# Patient Record
Sex: Male | Born: 1935
Health system: Southern US, Community
[De-identification: ages and names within clinical notes are randomized; demographics above are authoritative.]

## PROBLEM LIST (undated history)

## (undated) DIAGNOSIS — M1990 Unspecified osteoarthritis, unspecified site: Secondary | ICD-10-CM

## (undated) DIAGNOSIS — R351 Nocturia: Secondary | ICD-10-CM

## (undated) DIAGNOSIS — I10 Essential (primary) hypertension: Secondary | ICD-10-CM

## (undated) DIAGNOSIS — N400 Enlarged prostate without lower urinary tract symptoms: Secondary | ICD-10-CM

## (undated) DIAGNOSIS — M545 Low back pain, unspecified: Secondary | ICD-10-CM

## (undated) DIAGNOSIS — I639 Cerebral infarction, unspecified: Secondary | ICD-10-CM

## (undated) DIAGNOSIS — R42 Dizziness and giddiness: Secondary | ICD-10-CM

## (undated) DIAGNOSIS — B029 Zoster without complications: Secondary | ICD-10-CM

## (undated) DIAGNOSIS — R55 Syncope and collapse: Secondary | ICD-10-CM

## (undated) DIAGNOSIS — K219 Gastro-esophageal reflux disease without esophagitis: Secondary | ICD-10-CM

## (undated) DIAGNOSIS — S93609A Unspecified sprain of unspecified foot, initial encounter: Secondary | ICD-10-CM

## (undated) DIAGNOSIS — H9190 Unspecified hearing loss, unspecified ear: Secondary | ICD-10-CM

## (undated) DIAGNOSIS — N189 Chronic kidney disease, unspecified: Secondary | ICD-10-CM

## (undated) DIAGNOSIS — Z978 Presence of other specified devices: Secondary | ICD-10-CM

## (undated) DIAGNOSIS — E7800 Pure hypercholesterolemia, unspecified: Secondary | ICD-10-CM

## (undated) DIAGNOSIS — Z960 Presence of urogenital implants: Secondary | ICD-10-CM

## (undated) DIAGNOSIS — Z8673 Personal history of transient ischemic attack (TIA), and cerebral infarction without residual deficits: Secondary | ICD-10-CM

## (undated) HISTORY — DX: Dizziness and giddiness: R42

## (undated) HISTORY — PX: SHOULDER SURGERY: SHX246

## (undated) HISTORY — DX: Syncope and collapse: R55

## (undated) HISTORY — DX: Benign prostatic hyperplasia without lower urinary tract symptoms: N40.0

## (undated) HISTORY — PX: COLONOSCOPY: SHX5424

## (undated) HISTORY — DX: Essential (primary) hypertension: I10

## (undated) HISTORY — DX: Nocturia: R35.1

## (undated) HISTORY — DX: Chronic kidney disease, unspecified: N18.9

## (undated) HISTORY — DX: Low back pain, unspecified: M54.50

## (undated) HISTORY — DX: Zoster without complications: B02.9

## (undated) HISTORY — DX: Low back pain: M54.5

---

## 2000-05-24 ENCOUNTER — Encounter: Payer: Self-pay | Admitting: Family Medicine

## 2000-05-24 ENCOUNTER — Ambulatory Visit (HOSPITAL_COMMUNITY): Admission: RE | Admit: 2000-05-24 | Discharge: 2000-05-24 | Payer: Self-pay | Admitting: Family Medicine

## 2000-06-03 ENCOUNTER — Emergency Department (HOSPITAL_COMMUNITY): Admission: EM | Admit: 2000-06-03 | Discharge: 2000-06-03 | Payer: Self-pay | Admitting: *Deleted

## 2000-06-03 ENCOUNTER — Encounter: Payer: Self-pay | Admitting: *Deleted

## 2001-04-18 ENCOUNTER — Ambulatory Visit (HOSPITAL_COMMUNITY): Admission: RE | Admit: 2001-04-18 | Discharge: 2001-04-18 | Payer: Self-pay | Admitting: Internal Medicine

## 2003-04-27 ENCOUNTER — Ambulatory Visit (HOSPITAL_COMMUNITY): Admission: RE | Admit: 2003-04-27 | Discharge: 2003-04-27 | Payer: Self-pay | Admitting: Family Medicine

## 2003-04-30 ENCOUNTER — Ambulatory Visit (HOSPITAL_COMMUNITY): Admission: RE | Admit: 2003-04-30 | Discharge: 2003-04-30 | Payer: Self-pay | Admitting: Neurology

## 2003-05-07 ENCOUNTER — Ambulatory Visit (HOSPITAL_COMMUNITY): Admission: RE | Admit: 2003-05-07 | Discharge: 2003-05-07 | Payer: Self-pay | Admitting: Vascular Surgery

## 2003-06-26 ENCOUNTER — Encounter: Payer: Self-pay | Admitting: Orthopedic Surgery

## 2004-01-06 ENCOUNTER — Ambulatory Visit (HOSPITAL_COMMUNITY): Admission: RE | Admit: 2004-01-06 | Discharge: 2004-01-06 | Payer: Self-pay | Admitting: Family Medicine

## 2004-01-25 ENCOUNTER — Ambulatory Visit: Payer: Self-pay | Admitting: Orthopedic Surgery

## 2004-02-12 ENCOUNTER — Ambulatory Visit: Payer: Self-pay | Admitting: Orthopedic Surgery

## 2004-02-12 ENCOUNTER — Ambulatory Visit (HOSPITAL_COMMUNITY): Admission: RE | Admit: 2004-02-12 | Discharge: 2004-02-12 | Payer: Self-pay | Admitting: Orthopedic Surgery

## 2004-02-15 ENCOUNTER — Ambulatory Visit: Payer: Self-pay | Admitting: Orthopedic Surgery

## 2004-02-16 ENCOUNTER — Encounter (HOSPITAL_COMMUNITY): Admission: RE | Admit: 2004-02-16 | Discharge: 2004-03-17 | Payer: Self-pay | Admitting: Orthopedic Surgery

## 2004-03-07 ENCOUNTER — Ambulatory Visit: Payer: Self-pay | Admitting: Orthopedic Surgery

## 2004-03-22 ENCOUNTER — Encounter (HOSPITAL_COMMUNITY): Admission: RE | Admit: 2004-03-22 | Discharge: 2004-04-21 | Payer: Self-pay | Admitting: Orthopedic Surgery

## 2004-03-28 ENCOUNTER — Ambulatory Visit: Payer: Self-pay | Admitting: Orthopedic Surgery

## 2004-04-18 ENCOUNTER — Ambulatory Visit: Payer: Self-pay | Admitting: Orthopedic Surgery

## 2004-05-02 ENCOUNTER — Ambulatory Visit: Payer: Self-pay | Admitting: Orthopedic Surgery

## 2005-09-01 ENCOUNTER — Ambulatory Visit (HOSPITAL_COMMUNITY): Admission: RE | Admit: 2005-09-01 | Discharge: 2005-09-01 | Payer: Self-pay | Admitting: Family Medicine

## 2006-08-25 ENCOUNTER — Emergency Department (HOSPITAL_COMMUNITY): Admission: EM | Admit: 2006-08-25 | Discharge: 2006-08-25 | Payer: Self-pay | Admitting: Emergency Medicine

## 2007-07-18 ENCOUNTER — Ambulatory Visit: Payer: Self-pay | Admitting: Orthopedic Surgery

## 2007-07-18 DIAGNOSIS — S93609A Unspecified sprain of unspecified foot, initial encounter: Secondary | ICD-10-CM

## 2007-07-18 HISTORY — DX: Unspecified sprain of unspecified foot, initial encounter: S93.609A

## 2007-09-12 ENCOUNTER — Ambulatory Visit (HOSPITAL_COMMUNITY): Admission: RE | Admit: 2007-09-12 | Discharge: 2007-09-12 | Payer: Self-pay | Admitting: Family Medicine

## 2008-03-31 ENCOUNTER — Ambulatory Visit (HOSPITAL_COMMUNITY): Admission: RE | Admit: 2008-03-31 | Discharge: 2008-03-31 | Payer: Self-pay | Admitting: Family Medicine

## 2008-04-07 ENCOUNTER — Encounter: Admission: RE | Admit: 2008-04-07 | Discharge: 2008-04-07 | Payer: Self-pay | Admitting: Family Medicine

## 2008-04-30 ENCOUNTER — Encounter: Admission: RE | Admit: 2008-04-30 | Discharge: 2008-04-30 | Payer: Self-pay | Admitting: Family Medicine

## 2008-07-10 ENCOUNTER — Encounter: Admission: RE | Admit: 2008-07-10 | Discharge: 2008-07-10 | Payer: Self-pay | Admitting: Family Medicine

## 2010-02-11 ENCOUNTER — Encounter
Admission: RE | Admit: 2010-02-11 | Discharge: 2010-02-11 | Payer: Self-pay | Source: Home / Self Care | Attending: Family Medicine | Admitting: Family Medicine

## 2010-06-28 NOTE — Consult Note (Signed)
NAME:  Henry [name], Henry [name] NO.:  [id number]   MEDICAL RECORD NO.:  [id number]          PATIENT TYPE:  EMS   LOCATION:  ED                            FACILITY:  [facility]   PHYSICIAN:  Barbaraann Barthel, M.D. DATE OF BIRTH:  [birth year]   DATE OF CONSULTATION:  08/25/2006  DATE OF DISCHARGE:                                 CONSULTATION   REASON FOR CONSULTATION:  Surgery saw this 76 year old, white male in  the emergency room for lower back pain.   HISTORY OF PRESENT ILLNESS:  The patient states that he had  approximately a 24-hour history of sort of an insidious, dull, visceral  type of pain in his lower back.  He did not associate this with any  physical activity and this did not appear to be the usual  musculoskeletal discomforts that he complains of from time to time. He  came to the emergency room where he was evaluated.  His vital signs were  stable, temperature is 97.7, blood pressure 140/72, heart rate 81,  respirations 18.  CT scan showed some minimal stranding around the tail  of the pancreas.  He had no other abnormalities by CT scan with contrast  and without contrast, i.e. he had no evidence of a kidney stone or any  other disorders found.  No gallstones were noted on CT scan.   PHYSICAL EXAMINATION:  HEENT:  The patient's head was normocephalic.  Extraocular movements intact.  Pupils were round and reactive to light  and accommodation.  There was no conjunctive pallor.  Sclerae is a  normal tincture.  NECK:  Supple and cylindrical without jugular vein distension,  thyromegaly, tracheal deviation or bruits auscultated.  CHEST:  Clear to both anterior and posterior auscultation.  HEART:  Regular rhythm.  ABDOMEN:  Soft, no guarding.  Bowel sounds are present.  No femoral or  inguinal hernias are appreciated.  RECTAL:  Deferred.  EXTREMITIES:  Within normal limits.   REVIEW OF SYSTEMS:  GI:  No nausea, no vomiting, no diarrhea, no bright  red rectal bleeding  and no black tarry stools.  The patient did have a  history of pancreatitis in the past.  He states that he had one beer  last night at a [employer], only one beer, and this is the first  alcohol he has consumed in quite some time.  He had the pains before  this as well.  He felt better and then the pain was with him in the  morning and so he called me, and we decided to work him up in the  emergency room.  I wanted to be sure we were not missing any occult  thing like any dissection of the aorta or anything that may be insidious  and serious.  At any right, nothing of that sort was found on the CT  scan.  The rest of GI system of the within normal limits.  GU:  No  dysuria, no history of hematuria.  The patient has enlarged prostate.  He is being followed with PSA levels by Dr. Renard Matter.  CARDIORESPIRATORY:  History  of hypertension and dyslipidemia for which she takes Norvasc 5  mg daily, Lipitor 40 mg daily, Micardis 80/20 daily.  He is a nonsmoker,  nondrinker.  MUSCULOSKELETAL:  He has had shoulder surgery and complains  of various musculoskeletal complaints from time to time.  At present,  nothing out of the ordinary for him.  ENDOCRINE:  No history of diabetes  or thyroid disease.   LABORATORY DATA AND X-RAY FINDINGS:  White count is 11 with an H&H of  14.4 and 43.2, platelet counts normal at 253; neutrophils are normal at  68%.  Metabolic pattern is completely within normal limits with glucose  137, BUN 18, creatinine 0.96.  Amylase 51, not elevated and lipase is  normal at 26.   CT scan as mentioned above.   IMPRESSION:  Abdominal pain, possibly early acute pancreatitis, although  we do not have any serological confirmation of that.  CT scan minimal  changes.  Those are possibly related to old scars.   RECOMMENDATIONS:  I have discharged him and told them to come to the  emergency room should there be any acute changes and follow up with Dr.  Renard Matter so we can repeat  some amylase or lipase should he have any  recurrent or worsening symptoms.  His CT scan and lab results have been  faxed to my office and Dr. Renard Matter' office.  I discharged him on a clear  liquid and fat-free diet and he is told to contact us should there be  any acute changes.  The patient clinically is doing quite well and I  feel comfortable discharging him and following this as an outpatient.      Barbaraann Barthel, M.D.  Electronically Signed     WB/MEDQ  D:  08/25/2006  T:  08/25/2006  Job:  [id number]   cc:   Angus G. Renard Matter, MD  Fax: [phone]

## 2010-07-01 NOTE — Op Note (Signed)
NAMECAIUS, SILBERNAGEL              ACCOUNT NO.:  [id number]   MEDICAL RECORD NO.:  [id number]          PATIENT TYPE:  AMB   LOCATION:  DAY                           FACILITY:  [facility]   PHYSICIAN:  Vickki Hearing, M.D.DATE OF BIRTH:  05/13/1934   DATE OF PROCEDURE:  02/12/2004  DATE OF DISCHARGE:                                 OPERATIVE REPORT   PREOPERATIVE DIAGNOSIS:  Rotator cuff syndrome, right shoulder.   POSTOPERATIVE DIAGNOSIS:  Rotator cuff syndrome, right shoulder.   OPERATION PERFORMED:  Arthroscopic subacromial decompression.   SURGEON:  Vickki Hearing, M.D.   ANESTHESIA:  General.   ASSISTANT:  None.   FINDINGS:  There was a degenerative type 1 SLAP lesion of the biceps tendon.  There was synovitis of the glenohumeral joint.  There was fraying  of the  subscapularis tendon.  There was severe bursitis.  There was type two  acromion with lateral and anterior impinging bone.   DESCRIPTION OF PROCEDURE:  Mr. Grim was identified in the preop holding  area as Windy Fast L. Snee.  We marked his right shoulder as the surgical  site and the surgeon signed his initials.  He was given preoperative  antibiotics and taken to the operating room for general anesthesia.  He was  placed in a lateral decubitus position with axillary roll and padding of his  lower extremities.  His right arm was placed in an arm holder with 10 pounds  of weight.  Sterile prep and drape was performed and a time out was taken  and the patient's identity was confirmed.  Procedure and extremity were  confirmed as required.  A posterior portal was established.  The joint was  entered.  Diagnostic arthroscopy was performed and the findings are listed  as above.  All of their structures were normal.   The scope was placed in the subacromial space, bursitis was noted and was  resected.  The acromion was outlined with an Arthrocare wand and then a bur  was used to perform an acromioplasty.   Acromioplasty was completed.  The  room with a view was established.  The shoulder was irrigated and washed  and suctioned dry.  The portals were closed with 3-0 Prolene and the  shoulder was dressed sterilely.   The patient was extubated and taken to the recovery room in stable  condition.  Counts were correct.   COMPLICATIONS:  None.   ESTIMATED BLOOD LOSS:  Minimal.   At the end of the procedure, the patient's shoulder was injected with 30 mL  of Marcaine plain 0.5%.  In the recovery room, the patient will be placed in  a Cryocuff and will follow up on Monday, start physical therapy on Tuesday.  Discharged on Lorcet 10 650 and Norflex 100 mg twice daily.     Weyman Croon   SEH/MEDQ  D:  02/12/2004  T:  02/12/2004  Job:  [id number]

## 2010-07-01 NOTE — Consult Note (Signed)
NAME:  Henry [name], Henry [name]                        ACCOUNT NO.:  [id number]   MEDICAL RECORD NO.:  [id number]                   PATIENT TYPE:  OIB   LOCATION:  2899                                 FACILITY:  [facility]   PHYSICIAN:  Lacy Duverney, M.D.                  DATE OF BIRTH:  1934-11-21   DATE OF CONSULTATION:  DATE OF DISCHARGE:  05/07/2003                                   CONSULTATION   REFERRING PHYSICIAN:  Dr. Quita Skye. Hart Rochester.   REASON FOR CONSULTATION:  Request by Dr. Hart Rochester for interpretation of  intracranial views performed during carotid arteriogram.  Patient with  episode of weakness on left side of face with slurred speech.   RIGHT COMMON CAROTID ARTERIOGRAM -- AP AND LATERAL VIEWS (INTRACRANIAL):  No  cross-filling occurs with injection of the right common carotid artery.  No  high-grade stenosis of major vessels.  No aneurysm detected.  Fetal-type  circulation.   LEFT COMMON CAROTID ARTERIOGRAM -- AP AND LATERAL VIEWS (INTRACRANIAL):  Cross-filling of right anterior cerebral artery with left common carotid  artery injection.  No high-grade stenosis of major intracranial vessels.  No  aneurysm.   IMPRESSION:  Cross-filling occurs with injection of the left common carotid  artery but not the right common carotid artery.   No large vessel significant stenosis.                                               Lacy Duverney, M.D.    SO/MEDQ  D:  08/19/2003  T:  08/20/2003  Job:  [id number]   cc:   [facility] Radiology Mirian Mo

## 2010-07-01 NOTE — Procedures (Signed)
NAME:  Henry [name], Henry [name]                        ACCOUNT NO.:  [id number]   MEDICAL RECORD NO.:  [id number]                   PATIENT TYPE:  OUT   LOCATION:  RAD                                  FACILITY:  [facility]   PHYSICIAN:  Pricilla Riffle, M.D.                 DATE OF BIRTH:  07-03-1934   DATE OF PROCEDURE:  04/30/2003  DATE OF DISCHARGE:                                  ECHOCARDIOGRAM   INDICATIONS FOR PROCEDURE:  A 76 year old with history of cerebrovascular  accident and hypertension.   2-D ECHO WITH ECHO DOPPLER:  Left ventricle is normal in size with an end  diastolic dimension of 41 mm. The interventricular wall and posterior wall  are thickened at 14 mm and 13 mm each.   Left atrium normal. Right atrium normal. Right ventricle normal.   The aortic valve is grossly normal. There is no insufficiency. Mitral valve  is normal with no insufficiency. Pulmonic valve is normal with no  insufficiency. Tricuspid valve is normal with trace insufficiency.   Overall left ventricular systolic function is normal at approximately 65%.  Right ventricular systolic function is also normal.   No pericardial effusion is seen.      ___________________________________________                                            Pricilla Riffle, M.D.   PVR/MEDQ  D:  04/30/2003  T:  05/01/2003  Job:  [id number]

## 2010-07-01 NOTE — Procedures (Signed)
NAME:  Henry [name], Henry [name]                        ACCOUNT NO.:  [id number]   MEDICAL RECORD NO.:  [id number]                   PATIENT TYPE:  OUT   LOCATION:  RAD                                  FACILITY:  [facility]   PHYSICIAN:  Pricilla Riffle, M.D.                 DATE OF BIRTH:  December 14, 1934   DATE OF PROCEDURE:  04/30/2003  DATE OF DISCHARGE:                                  ECHOCARDIOGRAM   INDICATIONS FOR PROCEDURE:  A 76 year old with history of cerebrovascular  accident and hypertension.   2-D ECHO WITH ECHO DOPPLER.:  Left ventricle is normal in size with an end  diastolic dimension of 41 mm.  The interventricular septum is mildly  thickened at 14 mm. Posterior wall mildly thickened at 13 mm.   Left atrium and right atrium are grossly normal in size. Right ventricle is  normal.   The aortic valve and the aortic root is sclerotic. There is no aortic  stenosis. No insufficiency. Mitral valve is mildly thickened. There is no  stenosis. No insufficiency. Mitral inflow pattern is consistent with  abnormal diastolic function. The pulmonic valve is normal with trivial  insufficiency. Tricuspid valve is normal with trivial insufficiency.   Overall left ventricular systolic function is normal with a left ventricular  ejection fraction of approximately 60% to 65%. Right ventricular ejection  fraction is also normal.   No pericardial effusion is seen. Pericardial fat pad noted.   =      ___________________________________________                                            Pricilla Riffle, M.D.   PVR/MEDQ  D:  04/30/2003  T:  05/01/2003  Job:  [id number]

## 2010-07-01 NOTE — Op Note (Signed)
NAME:  Henry [name], Henry [name]                        ACCOUNT NO.:  [id number]   MEDICAL RECORD NO.:  [id number]                   PATIENT TYPE:  OIB   LOCATION:  2899                                 FACILITY:  [facility]   PHYSICIAN:  Quita Skye. Hart Rochester, M.D.               DATE OF BIRTH:  Apr 19, 1934   DATE OF PROCEDURE:  05/07/2003  DATE OF DISCHARGE:  05/07/2003                                 OPERATIVE REPORT   PREOPERATIVE DIAGNOSIS:  Right hemispheric transient ischemic attack with  mild carotid occlusive disease, rule out ulcerative plaque.   PROCEDURES:  1. Arch aortogram via right common femoral approach.  2. Selective right carotid biplane angiogram.  3. Selective left common carotid biplane angiogram.   SURGEON:  Quita Skye. Hart Rochester, M.D.   ANESTHESIA:  Local Xylocaine.   CONTRAST:  110 mL.   COMPLICATIONS:  None.   DESCRIPTION OF PROCEDURE:  The patient was taken to the [facility]  peripheral endovascular lab, placed in the supine position, at which time  the right groin was prepped with Betadine scrub and solution, draped in the  routine sterile manner.  After infiltration with 1% Xylocaine, the right  common femoral artery was entered percutaneously, a guidewire passed into  the suprarenal aorta under fluoroscopic guidance.  A 5 French sheath and  dilator were passed over the guidewire, the dilator removed, and a standard  pigtail catheter positioned in the aortic arch.  Arch aortogram was then  performed at 40 degrees at LAO projection, injecting 30 mL of contrast at 30  mL/sec.  This revealed the arch to have normal anatomy.  The innominate  artery, right subclavian artery, proximal right common carotid artery were  all widely patent, as was the right vertebral artery.  The left common  carotid artery arose adjacent to the innominate off the aortic arch in a  normal configuration, was widely patent, and the left subclavian artery and  left vertebral artery also were widely patent  with no stenosis.  The pigtail  catheter was then exchanged for an H1 Headhunter catheter and the right  common carotid artery was selectively catheterized without difficulty and  biplane right carotid angiogram performed with intracerebral views,  injecting 6 mL of contrast for the extracranial views and 8 mL for the  intracerebral views.  This revealed mild plaque formation in the distal  right common carotid artery with an approximately 30-40% stenosis and a  minimal narrowing at the proximal internal carotid artery approximating 30%.  AP, lateral, and oblique views of the bifurcation were obtained, and there  was no evidence of any ulceration or irregularity of this mildly stenotic  plaque.  Following this the left common carotid artery was then selectively  cannulated and a biplane left carotid angiogram with intracerebral views was  obtained in a similar fashion.  This revealed some mild narrowing at the  distal left common carotid artery approximating 40% with  no evidence of  significant ulceration noted on several views.  The common, internal, and  external carotid arteries otherwise were widely patent.  Intracerebral views  will be interpreted by the radiologist.  Having tolerated the procedure  well, the H1 catheter was removed over the guidewire, the sheath removed,  adequate compression applied, and no complications ensued.   FINDINGS:  1. Normal arch anatomy with widely patent bilateral subclavian and bilateral     common carotid arteries.  2. Widely patent bilateral vertebral arteries.  3. A 40% stenosis of the distal right common carotid artery without     ulceration.  4. A 40% stenosis of distal left common carotid artery at bifurcation     without ulceration.                                               Quita Skye Hart Rochester, M.D.    JDL/MEDQ  D:  05/07/2003  T:  05/08/2003  Job:  [id number]

## 2010-07-01 NOTE — H&P (Signed)
NAMEDEMARIO, FANIEL              ACCOUNT NO.:  [id number]   MEDICAL RECORD NO.:  [id number]           PATIENT TYPE:   LOCATION:                                FACILITY:  [facility]   PHYSICIAN:  Vickki Hearing, M.D.DATE OF BIRTH:  1934/06/26   DATE OF ADMISSION:  DATE OF DISCHARGE:  LH                                HISTORY & PHYSICAL   CHIEF COMPLAINT:  Right shoulder pain.   HISTORY:  This is a 76 year old male who jumped off a lawn tractor to avoid  hitting a tree and fell on his left side, rolled over, hit his right  shoulder.  He presented Jun 26, 2003, complaining of right shoulder pain  which was getting better with Advil.  He noted no weakness or loss of  motion.  However, since that time his symptoms have not resolved.  He still  has pain with overhead activity, and he is having trouble with the golf  game.  An MRI was ordered by Angus G. McInnis, M.D., which showed there was  an acromial spur, some mild degenerative changes of his acromioclavicular  joint.  There was tendinopathy of the supraspinatus tendon with a probable  small undersurface tear.  The patient has undergone physical therapy with  Thera-Bands, continued to take Advil.  His symptoms are not resolved.  He is  now presenting for arthroscopic subacromial decompression.   The patient's review of systems has been relatively normal.  He has no  medical problems, no previous surgery.  He is allergic to no medications.  However, he does have some hypertension, as he takes Norvasc, Micardis, and  Lipitor.  He also takes Plavix.   FAMILY HISTORY:  Negative.   SOCIAL HISTORY:  He is married.   PHYSICAL EXAMINATION:  VITAL SIGNS:  A weight of 218.  GENERAL:  His appearance was normal.  His grooming and hygiene were normal.  CARDIAC:  He had a normal cardiovascular exam.  LYMPHATIC:  He had no adenopathy.  MUSCULOSKELETAL:  Although he has full range of motion of the shoulder, he  had painful range of motion in  the arc of 120-150.  He had a positive  impingement sign.  There was no instability.  His rotator cuff was strong.  He had palpable tenderness over the anterior acromion.   After review of the MRI and x-rays, he does have an impinging spur, lateral  and anterior acromion.   Recommend arthroscopic subacromial decompression of the right shoulder.   The patient has been counseled on the risks and benefits of the procedure,  likely outcomes, and alternatives treatments, and agrees to proceed with  surgery and has given consent for such.     Weyman Croon   SEH/MEDQ  D:  01/28/2004  T:  01/28/2004  Job:  [id number]

## 2010-08-10 ENCOUNTER — Other Ambulatory Visit: Payer: Self-pay | Admitting: Family Medicine

## 2010-08-10 DIAGNOSIS — M549 Dorsalgia, unspecified: Secondary | ICD-10-CM

## 2010-08-30 ENCOUNTER — Ambulatory Visit
Admission: RE | Admit: 2010-08-30 | Discharge: 2010-08-30 | Disposition: A | Discharge status: Home / Self Care | Payer: Medicare Other | Source: Ambulatory Visit | Attending: Family Medicine | Admitting: Family Medicine

## 2010-08-30 VITALS — BP 145/71 | HR 57

## 2010-08-30 DIAGNOSIS — M549 Dorsalgia, unspecified: Secondary | ICD-10-CM

## 2010-08-30 MED ORDER — IOHEXOL 180 MG/ML  SOLN
1.0000 mL | Freq: Once | INTRAMUSCULAR | Status: AC | PRN
  Administered 2010-08-30: 1 mL via EPIDURAL

## 2010-08-30 MED ORDER — METHYLPREDNISOLONE ACETATE 40 MG/ML INJ SUSP (RADIOLOG
120.0000 mg | Freq: Once | INTRAMUSCULAR | Status: AC
  Administered 2010-08-30: 120 mg via EPIDURAL

## 2010-10-03 ENCOUNTER — Telehealth (INDEPENDENT_AMBULATORY_CARE_PROVIDER_SITE_OTHER): Payer: Self-pay | Admitting: Internal Medicine

## 2010-10-03 ENCOUNTER — Ambulatory Visit (HOSPITAL_COMMUNITY)
Admission: RE | Admit: 2010-10-03 | Discharge: 2010-10-03 | Disposition: A | Discharge status: Home / Self Care | Payer: Medicare Other | Source: Ambulatory Visit | Attending: Family Medicine | Admitting: Family Medicine

## 2010-10-03 ENCOUNTER — Other Ambulatory Visit (HOSPITAL_COMMUNITY): Payer: Self-pay | Admitting: Family Medicine

## 2010-10-03 ENCOUNTER — Other Ambulatory Visit (INDEPENDENT_AMBULATORY_CARE_PROVIDER_SITE_OTHER): Payer: Self-pay | Admitting: Internal Medicine

## 2010-10-03 DIAGNOSIS — I10 Essential (primary) hypertension: Secondary | ICD-10-CM | POA: Insufficient documentation

## 2010-10-03 DIAGNOSIS — R1013 Epigastric pain: Secondary | ICD-10-CM

## 2010-10-03 DIAGNOSIS — Z87891 Personal history of nicotine dependence: Secondary | ICD-10-CM | POA: Insufficient documentation

## 2010-10-03 NOTE — Telephone Encounter (Signed)
Epigastric pain x 5 days.  Started on PPI by Dr. Renard Matter. I have spoken with patient and will order an H. Pylori.  He will call with a progress report in 1 week. If not better, will schedule and EGD with Dr Karilyn Cota.

## 2010-10-04 LAB — H. PYLORI ANTIBODY, IGG: H Pylori IgG: <0.4 {ISR}

## 2010-10-05 ENCOUNTER — Telehealth (INDEPENDENT_AMBULATORY_CARE_PROVIDER_SITE_OTHER): Payer: Self-pay | Admitting: Internal Medicine

## 2010-10-05 NOTE — Telephone Encounter (Signed)
Results of H. Pylori given to patient.  He will have an office visit with me next week. He says he does feel better.

## 2010-10-11 ENCOUNTER — Ambulatory Visit (INDEPENDENT_AMBULATORY_CARE_PROVIDER_SITE_OTHER): Payer: Medicare Other | Admitting: Internal Medicine

## 2010-10-11 ENCOUNTER — Encounter (INDEPENDENT_AMBULATORY_CARE_PROVIDER_SITE_OTHER): Payer: Self-pay | Admitting: Internal Medicine

## 2010-10-11 VITALS — BP 108/68 | HR 72 | Temp 98.0°F | Ht 68.0 in | Wt 226.0 lb

## 2010-10-11 DIAGNOSIS — R1013 Epigastric pain: Secondary | ICD-10-CM

## 2010-10-11 DIAGNOSIS — K219 Gastro-esophageal reflux disease without esophagitis: Secondary | ICD-10-CM

## 2010-10-11 LAB — CBC AND DIFFERENTIAL: Platelets: 253 10*3/uL (ref 150–399)

## 2010-10-11 LAB — HEPATIC FUNCTION PANEL
ALT: 28 U/L (ref 10–40)
Alkaline Phosphatase: 39 U/L (ref 25–125)
Bilirubin, Total: 0.7 mg/dL

## 2010-10-11 LAB — BASIC METABOLIC PANEL
BUN: 21 mg/dL (ref 4–21)
Creatinine: 1 mg/dL (ref 0.6–1.3)
Potassium: 3.8 mmol/L (ref 3.4–5.3)
Sodium: 141 mmol/L (ref 137–147)

## 2010-10-11 MED ORDER — LANSOPRAZOLE 30 MG PO TBDP: 30.0000 mg | ORAL_TABLET | Freq: Every day | ORAL | Status: DC

## 2010-10-11 NOTE — Progress Notes (Addendum)
Subjective:     Patient ID: Henry [name], male   DOB: [birth year], 76 y.o.   MRN: [id number]  HPI  Henry [name] is a 76 yr old male referred to our office by Dr. Renard Matter for acid reflux.  He had symptoms for 4-5 days  He was started Prevaccid  last week by Dr. Renard Matter and now he says his symptoms are better. He was c/o esophageal pain: substernal.   Pain stoopped after starting medication. Sometimes after he eats he may get some substernal pain. Appetite is good. No weight loss. No abdominal pain.  BM x 1 a day. No rectal bleeding or melena. Last colonoscopy was 9 yrs ago by Dr. Karilyn Cota. He is due for another in 2013.  Review of Systems see hpi Current Outpatient Prescriptions  Medication Sig Dispense Refill  . amLODipine (NORVASC) 5 MG tablet Take 5 mg by mouth daily.        Marland Kitchen aspirin 81 MG tablet Take 81 mg by mouth daily.        Marland Kitchen atorvastatin (LIPITOR) 40 MG tablet Take 40 mg by mouth daily.        . lansoprazole (PREVACID SOLUTAB) 30 MG disintegrating tablet Take 1 tablet (30 mg total) by mouth daily.  30 tablet  1  . telmisartan-hydrochlorothiazide (MICARDIS HCT) 80-25 MG per tablet Take 1 tablet by mouth daily.          Past Surgical History  Procedure Date  . Shoulder surgery    Past Medical History  Diagnosis Date  . Hypertension    Review of patient's allergies indicates no known allergies.    Family Status  Relation Status Death Age  . Mother Deceased     unknown, Thyroid  . Father Deceased     old age  . Sister Deceased     Brain tumor  . Brother Deceased     Alcoholism, Blood clot from a fx   History   Social History  . Marital Status: Married    Spouse Name: N/A    Number of Children: N/A  . Years of Education: N/A   Occupational History  . Not on file.   Social History Main Topics  . Smoking status: Smoker, Current Status Unknown  . Smokeless tobacco: Not on file  . Alcohol Use: No  . Drug Use: No  . Sexually Active: Not on file   Other Topics Concern    . Not on file   Social History Narrative  . No narrative on file     Objective:   Physical Exam.Blood pressure 108/68, pulse 72, temperature 98 F (36.7 C), height 5\' 8"  (1.727 m), weight 226 lb (102.513 kg). Alert and oriented. Skin warm and dry. Oral mucosa is moist. Natural teeth in good condition. Sclera anicteric, conjunctivae is pink. Thyroid not enlarged. No cervical lymphadenopathy. Lungs clear. Heart regular rate and rhythm.  Abdomen is soft. Bowel sounds are positive. No hepatomegaly. No abdominal masses felt. No tenderness.  No edema to lower extremities. Patient is alert and oriented.      Assessment:    Espohageal pain which has now resolved with Prevacid.  Gallbladder disease needs to be ruled out.  I discussed this case with Dr Karilyn Cota.    Plan:     US gallbladder and a hepatic function (which has been drawn by Dr. Renard Matter)      10/11/2010  Labs: ALP 39, AST 19, ALT 28, Direct bili 0.2, Indirect 0.5  NA 141, K 3.8, Chloride 103,  Glucose 93, BUN 21, creatinine 0.95,  Total protein 4.3, Calcium 9.1, Triglycerides 81, HDL 46,  cholesterol 145, LDL 83, VLDL 16, Uric acid 7.1, T4 74, TSH 0.728, PSA  4.72  H and H 13.7 and 42.8, MCV 86.5, WBC 5.7

## 2010-10-18 ENCOUNTER — Ambulatory Visit (HOSPITAL_COMMUNITY)
Admission: RE | Admit: 2010-10-18 | Discharge: 2010-10-18 | Disposition: A | Discharge status: Home / Self Care | Payer: Medicare Other | Source: Ambulatory Visit | Attending: Internal Medicine | Admitting: Internal Medicine

## 2010-10-18 ENCOUNTER — Telehealth (INDEPENDENT_AMBULATORY_CARE_PROVIDER_SITE_OTHER): Payer: Self-pay | Admitting: Internal Medicine

## 2010-10-18 DIAGNOSIS — R1013 Epigastric pain: Secondary | ICD-10-CM | POA: Insufficient documentation

## 2010-10-18 DIAGNOSIS — R935 Abnormal findings on diagnostic imaging of other abdominal regions, including retroperitoneum: Secondary | ICD-10-CM | POA: Insufficient documentation

## 2010-10-18 DIAGNOSIS — K219 Gastro-esophageal reflux disease without esophagitis: Secondary | ICD-10-CM

## 2010-10-18 NOTE — Telephone Encounter (Signed)
I spoke with Henry [name] this am.  He is not having any epigastric pain.  He is taking Nexium at this time. Results of his US abdomen were reviewed with him.  I also discussed this case with Dr. Karilyn Cota.  If he has a recurrence of epigastric pain, Dr. Karilyn Cota will proceed with an EGD.  If the EGD is normal, then a HIDA scan will be ordered.   Henry [name]

## 2010-10-19 ENCOUNTER — Telehealth (INDEPENDENT_AMBULATORY_CARE_PROVIDER_SITE_OTHER): Payer: Self-pay | Admitting: Internal Medicine

## 2010-10-19 ENCOUNTER — Encounter (INDEPENDENT_AMBULATORY_CARE_PROVIDER_SITE_OTHER): Payer: Self-pay

## 2010-10-19 NOTE — Telephone Encounter (Signed)
He continues to have chest pain off and on.  I spoke with Dr. Karilyn Cota. Will need to proceed with an EGD. If normal, he will need a HIDA scan.

## 2010-10-20 ENCOUNTER — Telehealth (INDEPENDENT_AMBULATORY_CARE_PROVIDER_SITE_OTHER): Payer: Self-pay | Admitting: *Deleted

## 2010-10-20 DIAGNOSIS — K219 Gastro-esophageal reflux disease without esophagitis: Secondary | ICD-10-CM

## 2010-10-20 NOTE — Telephone Encounter (Signed)
EGD sch'd 10/28/10 @ 10:30 (9:30), asa 2 days, patient aware, instructions mailed

## 2010-10-20 NOTE — Telephone Encounter (Signed)
Per Camelia Eng, patient needs EGD  EGD sch'd 10/28/10 @ 10:30 (9:30), asa 2 days, patient aware, instructions mailed

## 2010-10-27 MED ORDER — SODIUM CHLORIDE 0.45 % IV SOLN
Freq: Once | INTRAVENOUS | Status: AC
  Administered 2010-10-28: 10:00:00 via INTRAVENOUS

## 2010-10-28 ENCOUNTER — Encounter (HOSPITAL_COMMUNITY)
Admission: RE | Disposition: A | Discharge status: Home / Self Care | Payer: Self-pay | Source: Ambulatory Visit | Attending: Internal Medicine

## 2010-10-28 ENCOUNTER — Other Ambulatory Visit (INDEPENDENT_AMBULATORY_CARE_PROVIDER_SITE_OTHER): Payer: Self-pay | Admitting: Internal Medicine

## 2010-10-28 ENCOUNTER — Ambulatory Visit (HOSPITAL_COMMUNITY)
Admission: RE | Admit: 2010-10-28 | Discharge: 2010-10-28 | Disposition: A | Discharge status: Home / Self Care | Payer: Medicare Other | Source: Ambulatory Visit | Attending: Internal Medicine | Admitting: Internal Medicine

## 2010-10-28 ENCOUNTER — Encounter (HOSPITAL_COMMUNITY): Payer: Self-pay | Admitting: *Deleted

## 2010-10-28 DIAGNOSIS — K208 Other esophagitis without bleeding: Secondary | ICD-10-CM | POA: Insufficient documentation

## 2010-10-28 DIAGNOSIS — R0789 Other chest pain: Secondary | ICD-10-CM

## 2010-10-28 DIAGNOSIS — Z7982 Long term (current) use of aspirin: Secondary | ICD-10-CM | POA: Insufficient documentation

## 2010-10-28 DIAGNOSIS — Z79899 Other long term (current) drug therapy: Secondary | ICD-10-CM | POA: Insufficient documentation

## 2010-10-28 DIAGNOSIS — K2970 Gastritis, unspecified, without bleeding: Secondary | ICD-10-CM | POA: Insufficient documentation

## 2010-10-28 DIAGNOSIS — K449 Diaphragmatic hernia without obstruction or gangrene: Secondary | ICD-10-CM

## 2010-10-28 DIAGNOSIS — K2270 Barrett's esophagus without dysplasia: Secondary | ICD-10-CM | POA: Insufficient documentation

## 2010-10-28 DIAGNOSIS — I10 Essential (primary) hypertension: Secondary | ICD-10-CM | POA: Insufficient documentation

## 2010-10-28 DIAGNOSIS — R109 Unspecified abdominal pain: Secondary | ICD-10-CM | POA: Insufficient documentation

## 2010-10-28 DIAGNOSIS — K2990 Gastroduodenitis, unspecified, without bleeding: Secondary | ICD-10-CM

## 2010-10-28 HISTORY — PX: ESOPHAGOGASTRODUODENOSCOPY: SHX5428

## 2010-10-28 SURGERY — EGD (ESOPHAGOGASTRODUODENOSCOPY)
Anesthesia: Moderate Sedation

## 2010-10-28 MED ORDER — MIDAZOLAM HCL 5 MG/5ML IJ SOLN
INTRAMUSCULAR | Status: AC
  Filled 2010-10-28: qty 10

## 2010-10-28 MED ORDER — BUTAMBEN-TETRACAINE-BENZOCAINE 2-2-14 % EX AERO
INHALATION_SPRAY | CUTANEOUS | Status: DC | PRN
  Administered 2010-10-28: 2 via TOPICAL

## 2010-10-28 MED ORDER — MEPERIDINE HCL 50 MG/ML IJ SOLN
INTRAMUSCULAR | Status: AC
  Filled 2010-10-28: qty 1

## 2010-10-28 MED ORDER — MEPERIDINE HCL 25 MG/ML IJ SOLN
INTRAMUSCULAR | Status: DC | PRN
  Administered 2010-10-28 (×2): 25 mg via INTRAVENOUS

## 2010-10-28 MED ORDER — MIDAZOLAM HCL 5 MG/5ML IJ SOLN
INTRAMUSCULAR | Status: DC | PRN
  Administered 2010-10-28 (×2): 2 mg via INTRAVENOUS
  Administered 2010-10-28: 1 mg via INTRAVENOUS

## 2010-10-28 NOTE — H&P (Signed)
Henry [name] is an 76 y.o. male.   Chief Complaint: Patient is here for EGD. HPI: Patient is 76 year old Caucasian male who was at 2 episodes of postprandial retrosternal pain. One of these episodes occurred while he was still on his PPI. His lab studies have been normal. She had upper abdominal ultrasound which is also unremarkable. He has heartburn but only with certain foods and not very frequently. He denies dysphagia. He also denies anorexia weight loss melena or rectal bleeding. He does not have any history of heart disease. He gets noninvasive cardiac testing done through [facility] every year.  Past Medical History  Diagnosis Date  . Hypertension     Past Surgical History  Procedure Date  . Shoulder surgery     History reviewed. No pertinent family history. Social History:  reports that he has never smoked. He does not have any smokeless tobacco history on file. He reports that he does not drink alcohol or use illicit drugs.  Allergies: No Known Allergies  Medications Prior to Admission  Medication Dose Route Frequency Provider Last Rate Last Dose  . 0.45 % sodium chloride infusion   Intravenous Once Malissa Hippo, MD 20 mL/hr at 10/28/10 0959    . meperidine (DEMEROL) 50 MG/ML injection           . midazolam (VERSED) 5 MG/5ML injection            Medications Prior to Admission  Medication Sig Dispense Refill  . amLODipine (NORVASC) 5 MG tablet Take 5 mg by mouth daily.        Marland Kitchen aspirin 81 MG tablet Take 81 mg by mouth daily.        Marland Kitchen atorvastatin (LIPITOR) 40 MG tablet Take 40 mg by mouth daily.        . lansoprazole (PREVACID SOLUTAB) 30 MG disintegrating tablet Take 1 tablet (30 mg total) by mouth daily.  30 tablet  1  . telmisartan-hydrochlorothiazide (MICARDIS HCT) 80-25 MG per tablet Take 1 tablet by mouth daily.          No results found for this or any previous visit (from the past 48 hour(s)). No results found.  Review of Systems  Constitutional:  Negative for weight loss.  Gastrointestinal: Negative for nausea, vomiting, abdominal pain, diarrhea, constipation, blood in stool and melena. Heartburn: occasionally with certain foods.    Blood pressure 154/80, pulse 56, temperature 97.9 F (36.6 C), temperature source Oral, resp. rate 18, SpO2 95.00%. Physical Exam  Constitutional: He appears well-developed and well-nourished.  HENT:  Mouth/Throat: Oropharynx is clear and moist.  Eyes: Conjunctivae are normal. No scleral icterus.  Neck: No thyromegaly present.  Cardiovascular: Normal rate, regular rhythm and normal heart sounds.   No murmur heard. Respiratory: Effort normal and breath sounds normal. He has no wheezes. He has no rales.  GI: Soft. He exhibits no distension and no mass. There is no tenderness.  Musculoskeletal: He exhibits no edema.  Lymphadenopathy:    He has no cervical adenopathy.  Neurological: He is alert.  Skin: Skin is warm and dry.     Assessment/Plan Atypical chest pain. Negative hepatobiliary ultrasound. Diagnostic esophagogastroduodenoscopy  Brithany Whitworth U 10/28/2010, 10:50 AM

## 2010-10-28 NOTE — Op Note (Signed)
EGD PROCEDURE REPORT  PATIENT:  Henry [name]  MR#:  [id number] Birthdate:  November 28, 1934, 76 y.o., male Endoscopist:  Dr. Malissa Hippo, MD Referred By:  Dr. Mosetta Pigeon, MD Procedure Date: 10/28/2010  Procedure:   EGD  Indications:  Atypical chest pain not responding to PPI. Hepatobiliary ultrasound negative for cholelithiasis. Noninvasive cardiac evaluation has been negative            Informed Consent: Procedure and risks were reviewed with the patient and informed consent was obtained. Medications:  Demerol 50 mg IV Versed 5 mg IV Cetacaine spray topically for oropharyngeal anesthesia  Description of procedure:  The endoscope was introduced through the mouth and advanced to the second portion of the duodenum without difficulty or limitations. The mucosal surfaces were surveyed very carefully during advancement of the scope and upon withdrawal.  Findings:  Esophagus:  Mucosa of the proximal and middle third was normal. Triangular ulcer located at distal esophagus extending to the GE junction along with 2 small erosions. Small patch of salmon-colored mucosa proximal to GE junction was biopsied. GEJ:  35 cm Hiatus:  38 cm Stomach:  Few small antral erosions and 7 mm submucosal lesion in antrum appearance typical of a lipoma Duodenum:  Few small bulbar erosions; normal post bulbar mucosa  Therapeutic/Diagnostic Maneuvers Performed:  See above  Complications:  None  Impression: Ulcerative  reflux esophagitis. 3 cm size sliding-type hernia. Biopsy taken from distal esophagus rule out short segment Barrett's. Subdental finding of small antral lipoma. Erosive gastroduodenitis.   Recommendations:  Anti-reflux measures reinforced. Patient advised to take Nexium every day 40 mg 30 minutes before breakfast by mouth. Please note that he already has a prescription. H. pylori serology will be checked today.  Zanovia Rotz U  10/28/2010  11:13 AM  CC: Dr. Alice Reichert,  MD & Dr. Bonnetta Barry ref. provider found

## 2010-11-03 ENCOUNTER — Encounter (HOSPITAL_COMMUNITY): Payer: Self-pay | Admitting: Internal Medicine

## 2010-11-04 ENCOUNTER — Encounter (INDEPENDENT_AMBULATORY_CARE_PROVIDER_SITE_OTHER): Payer: Self-pay | Admitting: *Deleted

## 2010-11-29 LAB — URINALYSIS, ROUTINE W REFLEX MICROSCOPIC
Ketones, ur: NEGATIVE
Leukocytes, UA: NEGATIVE
Nitrite: NEGATIVE
Protein, ur: NEGATIVE
Specific Gravity, Urine: 1.02
pH: 6

## 2010-11-29 LAB — CBC
HCT: 43.2
Hemoglobin: 14.4
MCV: 85.3
Platelets: 253
WBC: 11 — ABNORMAL HIGH

## 2010-11-29 LAB — DIFFERENTIAL
Basophils Absolute: 0
Basophils Relative: 0
Lymphocytes Relative: 22
Monocytes Absolute: 0.8 — ABNORMAL HIGH
Neutro Abs: 7.5
Neutrophils Relative %: 68

## 2010-11-29 LAB — AMYLASE: Amylase: 51

## 2010-11-29 LAB — CULTURE, BLOOD (ROUTINE X 2): Culture: NO GROWTH

## 2010-11-29 LAB — COMPREHENSIVE METABOLIC PANEL
Albumin: 3.9
Alkaline Phosphatase: 51
BUN: 18
Chloride: 103
Creatinine, Ser: 0.96
GFR calc non Af Amer: >60
Glucose, Bld: 137 — ABNORMAL HIGH
Potassium: 3.4 — ABNORMAL LOW
Total Bilirubin: 0.8

## 2010-11-29 LAB — LIPASE, BLOOD: Lipase: 26

## 2011-04-11 ENCOUNTER — Encounter (INDEPENDENT_AMBULATORY_CARE_PROVIDER_SITE_OTHER): Payer: Self-pay | Admitting: *Deleted

## 2011-04-12 ENCOUNTER — Encounter (INDEPENDENT_AMBULATORY_CARE_PROVIDER_SITE_OTHER): Payer: Self-pay | Admitting: *Deleted

## 2011-04-19 ENCOUNTER — Other Ambulatory Visit (INDEPENDENT_AMBULATORY_CARE_PROVIDER_SITE_OTHER): Payer: Self-pay | Admitting: *Deleted

## 2011-04-19 ENCOUNTER — Telehealth (INDEPENDENT_AMBULATORY_CARE_PROVIDER_SITE_OTHER): Payer: Self-pay | Admitting: *Deleted

## 2011-04-19 DIAGNOSIS — Z1211 Encounter for screening for malignant neoplasm of colon: Secondary | ICD-10-CM

## 2011-04-19 NOTE — Telephone Encounter (Signed)
Patient needs movi prep 

## 2011-04-20 ENCOUNTER — Telehealth (INDEPENDENT_AMBULATORY_CARE_PROVIDER_SITE_OTHER): Payer: Self-pay | Admitting: *Deleted

## 2011-04-20 MED ORDER — PEG-KCL-NACL-NASULF-NA ASC-C 100 G PO SOLR: 1.0000 | Freq: Once | ORAL | Status: DC

## 2011-04-20 NOTE — Telephone Encounter (Signed)
Requesting MD/PCP:  [facility]     Name & DOB: Henry [name]  08-Jul-2034     Procedure: TCS  Reason/Indication:  screening  Has patient had this procedure before?  yes  If so, when, by whom and where?  2003  Is there a family history of colon cancer?  no  Who?  What age when diagnosed?    Is patient diabetic?   no      Does patient have prosthetic heart valve?  no  Do you have a pacemaker?  no  Has patient had joint replacement within last 12 months?  no  Is patient on Coumadin, Plavix and/or Aspirin? yes  Medications: asa 81 mg daily, micardis 80/25 mg daily, norvasc 5 mg daily, Vit D3 daily, fish oil daily  Allergies: nkda  Pharmacy: [facility]  Medication Adjustment: asa 2 days  Procedure date & time: 05/17/11 at 830

## 2011-04-21 NOTE — Telephone Encounter (Signed)
agree

## 2011-05-03 ENCOUNTER — Encounter (HOSPITAL_COMMUNITY): Payer: Self-pay | Admitting: Pharmacy Technician

## 2011-05-10 ENCOUNTER — Encounter (INDEPENDENT_AMBULATORY_CARE_PROVIDER_SITE_OTHER): Payer: Self-pay | Admitting: Internal Medicine

## 2011-05-10 ENCOUNTER — Ambulatory Visit (INDEPENDENT_AMBULATORY_CARE_PROVIDER_SITE_OTHER): Payer: Medicare Other | Admitting: Internal Medicine

## 2011-05-10 VITALS — BP 116/72 | HR 72 | Temp 97.4°F | Ht 69.0 in | Wt 236.9 lb

## 2011-05-10 DIAGNOSIS — K209 Esophagitis, unspecified without bleeding: Secondary | ICD-10-CM

## 2011-05-10 DIAGNOSIS — K2270 Barrett's esophagus without dysplasia: Secondary | ICD-10-CM

## 2011-05-10 DIAGNOSIS — I10 Essential (primary) hypertension: Secondary | ICD-10-CM | POA: Insufficient documentation

## 2011-05-10 DIAGNOSIS — E785 Hyperlipidemia, unspecified: Secondary | ICD-10-CM | POA: Insufficient documentation

## 2011-05-10 MED ORDER — DEXLANSOPRAZOLE 30 MG PO CPDR: 30.0000 mg | DELAYED_RELEASE_CAPSULE | Freq: Every day | ORAL | Status: DC

## 2011-05-10 NOTE — Progress Notes (Signed)
Subjective:     Patient ID: Henry [name], male   DOB: Nov 13, 1934, 77 y.o.   MRN: [id number]  HPI [name] is a 77 yr old male presenting today for f/u. Acid reflux is controlled with the Nexium but his upperlegs have hurt since he started the Nexium. Appetite has been good. No weight loss. No abdominal pain.BMs are normal. He is scheduled for a colonoscopy with Dr. Karilyn Cota next month.       10/28/2010: WUJ:[id number]:  Ulcerative reflux esophagitis.  3 cm size sliding-type hernia.  Biopsy taken from distal esophagus rule out short segment Barrett's.  Subdental finding of small antral lipoma.  Erosive gastroduodenitis.  Biopsy: revealed Barrett's esophagus. Intestinal metaplasia. No dysplasia or malignancy identified.   Review of Systems see hpi Current Outpatient Prescriptions  Medication Sig Dispense Refill  . amLODipine (NORVASC) 5 MG tablet Take 5 mg by mouth daily.        Marland Kitchen aspirin 81 MG tablet Take 40.5 mg by mouth daily.       Marland Kitchen atorvastatin (LIPITOR) 40 MG tablet Take 40 mg by mouth daily.        . cholecalciferol (VITAMIN D) 1000 UNITS tablet Take 1,000 Units by mouth daily.      Marland Kitchen esomeprazole (NEXIUM) 40 MG capsule Take 40 mg by mouth daily.      Marland Kitchen telmisartan-hydrochlorothiazide (MICARDIS HCT) 80-25 MG per tablet Take 1 tablet by mouth daily.        . fish oil-omega-3 fatty acids 1000 MG capsule Take 1 g by mouth daily.      . peg 3350 powder (MOVIPREP) 100 G SOLR Take 1 kit (100 g total) by mouth once.  1 kit  0   Past Medical History  Diagnosis Date  . Hypertension    Past Surgical History  Procedure Date  . Shoulder surgery   . Esophagogastroduodenoscopy 10/28/2010    Procedure: ESOPHAGOGASTRODUODENOSCOPY (EGD);  Surgeon: Malissa Hippo, MD;  Location[facility];  Service: Endoscopy;  Laterality: N/A;  10:30   Family Status  Relation Status Death Age  . Mother Deceased     unknown, Thyroid  . Father Deceased     old age  . Sister Deceased     Brain  tumor  . Brother Deceased     Alcoholism, Blood clot from a fx   History   Social History  . Marital Status: Married    Spouse Name: N/A    Number of Children: N/A  . Years of Education: N/A   Occupational History  . Not on file.   Social History Main Topics  . Smoking status: Never Smoker   . Smokeless tobacco: Not on file  . Alcohol Use: No  . Drug Use: No  . Sexually Active: Not on file   Other Topics Concern  . Not on file   Social History Narrative  . No narrative on file   No Known Allergies     Objective:   Physical Exam Filed Vitals:   05/10/11 1001  Height: 5\' 9"  (1.753 m)  Weight: 236 lb 14.4 oz (107.457 kg)  Alert and oriented. Skin warm and dry. Oral mucosa is moist.   . Sclera anicteric, conjunctivae is pink. Thyroid not enlarged. No cervical lymphadenopathy. Lungs clear. Heart regular rate and rhythm.  Abdomen is soft. Bowel sounds are positive. No hepatomegaly. No abdominal masses felt. No tenderness.  No edema to lower extremities. Patient is alert and oriented.  Assessment:    Barrett's esophagus, Hx of reflux esophagitis.    Plan:   Dexilant samples given to patient for one month to try. Continue present medications. Surveillance EGD in 3 yrs with Dr. Karilyn Cota.

## 2011-05-10 NOTE — Patient Instructions (Signed)
Continue PPI. Surveillance EGD in 3 yrs

## 2011-05-11 ENCOUNTER — Ambulatory Visit (INDEPENDENT_AMBULATORY_CARE_PROVIDER_SITE_OTHER): Payer: Medicare Other | Admitting: Internal Medicine

## 2011-05-11 ENCOUNTER — Telehealth (INDEPENDENT_AMBULATORY_CARE_PROVIDER_SITE_OTHER): Payer: Self-pay | Admitting: *Deleted

## 2011-05-11 NOTE — Telephone Encounter (Signed)
[name] , Henry [name] called the office this morning and stated that the co payment on the Dexilant was going to be $ 44:00 a month, and that he understood that you was going to do something to bring that cost down for him. I explained about the Dexilant card that we may have that he could present to his pharmacy to see if this would help with the cost, Also , if the Dexilant was or would work better that the Nexium for him , a Prior Auth might help. The patient states that he is going to stay on the Nexium as this has worked just fine for him. He ask that you call him on Monday on his cell [phone], to let him know what to do.

## 2011-05-17 ENCOUNTER — Encounter (HOSPITAL_COMMUNITY): Payer: Self-pay | Admitting: *Deleted

## 2011-05-17 ENCOUNTER — Ambulatory Visit (HOSPITAL_COMMUNITY)
Admission: RE | Admit: 2011-05-17 | Discharge: 2011-05-17 | Disposition: A | Discharge status: Home / Self Care | Payer: Medicare Other | Source: Ambulatory Visit | Attending: Internal Medicine | Admitting: Internal Medicine

## 2011-05-17 ENCOUNTER — Encounter (HOSPITAL_COMMUNITY)
Admission: RE | Disposition: A | Discharge status: Home / Self Care | Payer: Self-pay | Source: Ambulatory Visit | Attending: Internal Medicine

## 2011-05-17 DIAGNOSIS — Q438 Other specified congenital malformations of intestine: Secondary | ICD-10-CM | POA: Insufficient documentation

## 2011-05-17 DIAGNOSIS — Z1211 Encounter for screening for malignant neoplasm of colon: Secondary | ICD-10-CM | POA: Insufficient documentation

## 2011-05-17 DIAGNOSIS — I10 Essential (primary) hypertension: Secondary | ICD-10-CM | POA: Insufficient documentation

## 2011-05-17 DIAGNOSIS — K6389 Other specified diseases of intestine: Secondary | ICD-10-CM

## 2011-05-17 DIAGNOSIS — Z79899 Other long term (current) drug therapy: Secondary | ICD-10-CM | POA: Insufficient documentation

## 2011-05-17 DIAGNOSIS — K649 Unspecified hemorrhoids: Secondary | ICD-10-CM | POA: Insufficient documentation

## 2011-05-17 DIAGNOSIS — K644 Residual hemorrhoidal skin tags: Secondary | ICD-10-CM

## 2011-05-17 DIAGNOSIS — E7800 Pure hypercholesterolemia, unspecified: Secondary | ICD-10-CM | POA: Insufficient documentation

## 2011-05-17 DIAGNOSIS — Z7982 Long term (current) use of aspirin: Secondary | ICD-10-CM | POA: Insufficient documentation

## 2011-05-17 DIAGNOSIS — K219 Gastro-esophageal reflux disease without esophagitis: Secondary | ICD-10-CM | POA: Insufficient documentation

## 2011-05-17 HISTORY — DX: Gastro-esophageal reflux disease without esophagitis: K21.9

## 2011-05-17 HISTORY — PX: COLONOSCOPY: SHX5424

## 2011-05-17 HISTORY — DX: Pure hypercholesterolemia, unspecified: E78.00

## 2011-05-17 SURGERY — COLONOSCOPY
Anesthesia: Moderate Sedation

## 2011-05-17 MED ORDER — MIDAZOLAM HCL 5 MG/5ML IJ SOLN
INTRAMUSCULAR | Status: AC
  Filled 2011-05-17: qty 10

## 2011-05-17 MED ORDER — STERILE WATER FOR IRRIGATION IR SOLN
Status: DC | PRN
  Administered 2011-05-17: 08:00:00

## 2011-05-17 MED ORDER — MIDAZOLAM HCL 5 MG/5ML IJ SOLN
INTRAMUSCULAR | Status: DC | PRN
  Administered 2011-05-17: 2 mg via INTRAVENOUS
  Administered 2011-05-17 (×2): 1 mg via INTRAVENOUS
  Administered 2011-05-17: 2 mg via INTRAVENOUS
  Administered 2011-05-17 (×2): 1 mg via INTRAVENOUS

## 2011-05-17 MED ORDER — MEPERIDINE HCL 50 MG/ML IJ SOLN
INTRAMUSCULAR | Status: DC | PRN
  Administered 2011-05-17 (×2): 25 mg via INTRAVENOUS

## 2011-05-17 MED ORDER — MEPERIDINE HCL 50 MG/ML IJ SOLN
INTRAMUSCULAR | Status: AC
  Filled 2011-05-17: qty 1

## 2011-05-17 MED ORDER — SODIUM CHLORIDE 0.45 % IV SOLN
Freq: Once | INTRAVENOUS | Status: AC
  Administered 2011-05-17: 08:00:00 via INTRAVENOUS

## 2011-05-17 NOTE — Discharge Instructions (Signed)
Resume usual medications and diet. No driving for 24 hours. Next screening exam in 10 yearsHemorrhoids Hemorrhoids are enlarged (dilated) veins around the rectum. There are 2 types of hemorrhoids, and the type of hemorrhoid is determined by its location. Internal hemorrhoids occur in the veins just inside the rectum.They are usually not painful, but they may bleed.However, they may poke through to the outside and become irritated and painful. External hemorrhoids involve the veins outside the anus and can be felt as a painful swelling or hard lump near the anus.They are often itchy and may crack and bleed. Sometimes clots will form in the veins. This makes them swollen and painful. These are called thrombosed hemorrhoids. CAUSES Causes of hemorrhoids include:  Pregnancy. This increases the pressure in the hemorrhoidal veins.   Constipation.   Straining to have a bowel movement.   Obesity.   Heavy lifting or other activity that caused you to strain.  TREATMENT Most of the time hemorrhoids improve in 1 to 2 weeks. However, if symptoms do not seem to be getting better or if you have a lot of rectal bleeding, your caregiver may perform a procedure to help make the hemorrhoids get smaller or remove them completely.Possible treatments include:  Rubber band ligation. A rubber band is placed at the base of the hemorrhoid to cut off the circulation.   Sclerotherapy. A chemical is injected to shrink the hemorrhoid.   Infrared light therapy. Tools are used to burn the hemorrhoid.   Hemorrhoidectomy. This is surgical removal of the hemorrhoid.  HOME CARE INSTRUCTIONS   Increase fiber in your diet. Ask your caregiver about using fiber supplements.   Drink enough water and fluids to keep your urine clear or pale yellow.   Exercise regularly.   Go to the bathroom when you have the urge to have a bowel movement. Do not wait.   Avoid straining to have bowel movements.   Keep the anal area  dry and clean.   Only take over-the-counter or prescription medicines for pain, discomfort, or fever as directed by your caregiver.  If your hemorrhoids are thrombosed:  Take warm sitz baths for 20 to 30 minutes, 3 to 4 times per day.   If the hemorrhoids are very tender and swollen, place ice packs on the area as tolerated. Using ice packs between sitz baths may be helpful. Fill a plastic bag with ice. Place a towel between the bag of ice and your skin.   Medicated creams and suppositories may be used or applied as directed.   Do not use a donut-shaped pillow or sit on the toilet for long periods. This increases blood pooling and pain.  SEEK MEDICAL CARE IF:   You have increasing pain and swelling that is not controlled with your medicine.   You have uncontrolled bleeding.   You have difficulty or you are unable to have a bowel movement.   You have pain or inflammation outside the area of the hemorrhoids.   You have chills or an oral temperature above 102 F (38.9 C).  MAKE SURE YOU:   Understand these instructions.   Will watch your condition.   Will get help right away if you are not doing well or get worse.  Document Released: 01/28/2000 Document Revised: 01/19/2011 Document Reviewed: 06/04/2007 [facility] Patient Information 2012 [place], [place].Colonoscopy Care After Read the instructions outlined below and refer to this sheet in the next few weeks. These discharge instructions provide you with general information on caring for yourself after  you leave the hospital. Your doctor may also give you specific instructions. While your treatment has been planned according to the most current medical practices available, unavoidable complications occasionally occur. If you have any problems or questions after discharge, call your doctor. HOME CARE INSTRUCTIONS ACTIVITY:  You may resume your regular activity, but move at a slower pace for the next 24 hours.   Take frequent rest  periods for the next 24 hours.   Walking will help get rid of the air and reduce the bloated feeling in your belly (abdomen).   No driving for 24 hours (because of the medicine (anesthesia) used during the test).   You may shower.   Do not sign any important legal documents or operate any machinery for 24 hours (because of the anesthesia used during the test).  NUTRITION:  Drink plenty of fluids.   You may resume your normal diet as instructed by your doctor.   Begin with a light meal and progress to your normal diet. Heavy or fried foods are harder to digest and may make you feel sick to your stomach (nauseated).   Avoid alcoholic beverages for 24 hours or as instructed.  MEDICATIONS:  You may resume your normal medications unless your doctor tells you otherwise.  WHAT TO EXPECT TODAY:  Some feelings of bloating in the abdomen.   Passage of more gas than usual.   Spotting of blood in your stool or on the toilet paper.  IF YOU HAD POLYPS REMOVED DURING THE COLONOSCOPY:  No aspirin products for 7 days or as instructed.   No alcohol for 7 days or as instructed.   Eat a soft diet for the next 24 hours.  FINDING OUT THE RESULTS OF YOUR TEST Not all test results are available during your visit. If your test results are not back during the visit, make an appointment with your caregiver to find out the results. Do not assume everything is normal if you have not heard from your caregiver or the medical facility. It is important for you to follow up on all of your test results.  SEEK IMMEDIATE MEDICAL CARE IF:  You have more than a spotting of blood in your stool.   Your belly is swollen (abdominal distention).   You are nauseated or vomiting.   You have a fever.   You have abdominal pain or discomfort that is severe or gets worse throughout the day.  Document Released: 09/14/2003 Document Revised: 01/19/2011 Document Reviewed: 09/12/2007 [facility] Patient Information 2012  [place], [place].

## 2011-05-17 NOTE — H&P (Signed)
Henry [name] is an 77 y.o. male.   Chief Complaint: Patient is here for colonoscopy. HPI: Patient is a 77 year old Caucasian male who is in for average risk screening colonoscopy. His last exam was 10 years ago. He denies abdominal pain rectal bleeding or change in his bowel habits. Family history is negative for colorectal carcinoma.  Past Medical History  Diagnosis Date  . Hypertension   . GERD (gastroesophageal reflux disease)   . Hypercholesteremia     Past Surgical History  Procedure Date  . Shoulder surgery   . Esophagogastroduodenoscopy 10/28/2010    Procedure: ESOPHAGOGASTRODUODENOSCOPY (EGD);  Surgeon: Malissa Hippo, MD;  Location: [facility];  Service: Endoscopy;  Laterality: N/A;  10:30  . Colonoscopy     Family History  Problem Relation Age of Onset  . Colon cancer Neg Hx    Social History:  reports that he has quit smoking. He does not have any smokeless tobacco history on file. He reports that he does not drink alcohol or use illicit drugs.  Allergies: No Known Allergies  Medications Prior to Admission  Medication Dose Route Frequency Provider Last Rate Last Dose  . 0.45 % sodium chloride infusion   Intravenous Once Malissa Hippo, MD 20 mL/hr at 05/17/11 0753     Medications Prior to Admission  Medication Sig Dispense Refill  . amLODipine (NORVASC) 5 MG tablet Take 5 mg by mouth daily.        Marland Kitchen atorvastatin (LIPITOR) 40 MG tablet Take 40 mg by mouth daily.        . hydrochlorothiazide (HYDRODIURIL) 25 MG tablet Take 25 mg by mouth daily.      . peg 3350 powder (MOVIPREP) 100 G SOLR Take 1 kit (100 g total) by mouth once.  1 kit  0  . telmisartan (MICARDIS) 80 MG tablet Take 80 mg by mouth daily.      Marland Kitchen aspirin 81 MG tablet Take 40.5 mg by mouth daily.       Marland Kitchen telmisartan-hydrochlorothiazide (MICARDIS HCT) 80-25 MG per tablet Take 1 tablet by mouth daily.          No results found for this or any previous visit (from the past 48 hour(s)). No results  found.  Review of Systems  Constitutional: Negative for weight loss.  Gastrointestinal: Negative for abdominal pain, diarrhea, constipation, blood in stool and melena.    Blood pressure 166/86, pulse 71, temperature 97.8 F (36.6 C), temperature source Oral, resp. rate 20, height 5\' 9"  (1.753 m), weight 236 lb (107.049 kg), SpO2 96.00%. Physical Exam  Constitutional: He appears well-developed and well-nourished.  HENT:  Mouth/Throat: Oropharynx is clear and moist.  Eyes: Conjunctivae are normal. No scleral icterus.  Neck: No thyromegaly present.  Cardiovascular: Normal rate, regular rhythm and normal heart sounds.   No murmur heard. Respiratory: Effort normal and breath sounds normal.  GI: Soft. He exhibits no distension and no mass. There is no tenderness.  Musculoskeletal: He exhibits no edema.  Lymphadenopathy:    He has no cervical adenopathy.  Neurological: He is alert.  Skin: Skin is warm and dry.     Assessment/Plan Average risk screening colonoscopy.  Navreet Bolda U 05/17/2011, 8:21 AM

## 2011-05-17 NOTE — Op Note (Signed)
COLONOSCOPY PROCEDURE REPORT  PATIENT:  Henry [name]  MR#:  [id number] Birthdate:  1934/04/26, 77 y.o., male Endoscopist:  Dr. Malissa Hippo, MD Referred By:  Dr. Marcelline Mates, MD Procedure Date: 05/17/2011  Procedure:   Colonoscopy  Indications:  Patient is 77 year old Caucasian male who is undergoing average risk screening colonoscopy. Last exam  was 10 years ago.  Informed Consent:  The procedure and risks were reviewed with the patient and informed consent was obtained.  Medications:  Demerol 50 mg IV Versed 8 mg IV  Description of procedure:  After a digital rectal exam was performed, that colonoscope was advanced from the anus through the rectum and colon to the area of the cecum, ileocecal valve and appendiceal orifice. The area behind the ileocecal valve was seen by pulling the mucosa of the biopsy forceps. The area was well seen with this maneuver. These structures were well-seen and photographed for the record. From the level of the cecum and ileocecal valve, the scope was slowly and cautiously withdrawn. The mucosal surfaces were carefully surveyed utilizing scope tip to flexion to facilitate fold flattening as needed. The scope was pulled down into the rectum where a thorough exam including retroflexion was performed.  Findings:   Prep satisfactory. Redundant sigmoid colon and hepatic flexure. No polyps diverticula or other abnormalities noted. Normal rectal mucosa. Small hemorrhoids below the dentate line.  Therapeutic/Diagnostic Maneuvers Performed:  None.  Complications:  None  Cecal Withdrawal Time:  7 minutes  Impression:  Examination performed to cecum. Redundant colon but no evidence of colonic polyps or other abnormalities. Small external hemorrhoids.  Recommendations:  Standard instructions given. Next screening exam in 10 years.  Bond Grieshop U  05/17/2011 9:33 AM  CC: Dr. Alice Reichert, MD, MD & Dr. Bonnetta Barry ref. provider found

## 2011-05-19 ENCOUNTER — Encounter (HOSPITAL_COMMUNITY): Payer: Self-pay | Admitting: Internal Medicine

## 2011-08-10 ENCOUNTER — Other Ambulatory Visit: Payer: Self-pay | Admitting: Family Medicine

## 2011-08-10 DIAGNOSIS — M5126 Other intervertebral disc displacement, lumbar region: Secondary | ICD-10-CM

## 2011-08-21 ENCOUNTER — Ambulatory Visit
Admission: RE | Admit: 2011-08-21 | Discharge: 2011-08-21 | Disposition: A | Discharge status: Home / Self Care | Payer: Medicare Other | Source: Ambulatory Visit | Attending: Family Medicine | Admitting: Family Medicine

## 2011-08-21 DIAGNOSIS — M5126 Other intervertebral disc displacement, lumbar region: Secondary | ICD-10-CM

## 2011-08-21 MED ORDER — METHYLPREDNISOLONE ACETATE 40 MG/ML INJ SUSP (RADIOLOG
120.0000 mg | Freq: Once | INTRAMUSCULAR | Status: AC
  Administered 2011-08-21: 120 mg via EPIDURAL

## 2011-08-21 MED ORDER — IOHEXOL 180 MG/ML  SOLN
1.0000 mL | Freq: Once | INTRAMUSCULAR | Status: AC | PRN
  Administered 2011-08-21: 1 mL via EPIDURAL

## 2012-10-25 ENCOUNTER — Other Ambulatory Visit (HOSPITAL_COMMUNITY): Payer: Self-pay | Admitting: Family Medicine

## 2012-10-25 ENCOUNTER — Ambulatory Visit (HOSPITAL_COMMUNITY)
Admission: RE | Admit: 2012-10-25 | Discharge: 2012-10-25 | Disposition: A | Discharge status: Home / Self Care | Payer: Medicare Other | Source: Ambulatory Visit | Attending: Family Medicine | Admitting: Family Medicine

## 2012-10-25 DIAGNOSIS — I10 Essential (primary) hypertension: Secondary | ICD-10-CM | POA: Insufficient documentation

## 2012-10-25 DIAGNOSIS — Z87891 Personal history of nicotine dependence: Secondary | ICD-10-CM | POA: Insufficient documentation

## 2012-11-14 ENCOUNTER — Encounter: Payer: Self-pay | Admitting: Orthopedic Surgery

## 2012-11-14 ENCOUNTER — Ambulatory Visit (INDEPENDENT_AMBULATORY_CARE_PROVIDER_SITE_OTHER): Payer: Medicare Other

## 2012-11-14 ENCOUNTER — Ambulatory Visit (INDEPENDENT_AMBULATORY_CARE_PROVIDER_SITE_OTHER): Payer: Medicare Other | Admitting: Orthopedic Surgery

## 2012-11-14 VITALS — BP 137/68 | Ht 69.0 in | Wt 232.0 lb

## 2012-11-14 DIAGNOSIS — M25519 Pain in unspecified shoulder: Secondary | ICD-10-CM

## 2012-11-14 DIAGNOSIS — M25511 Pain in right shoulder: Secondary | ICD-10-CM

## 2012-11-14 NOTE — Patient Instructions (Addendum)
You have received a steroid shot. 15% of patients experience increased pain at the injection site with in the next 24 hours. This is best treated with ice and tylenol extra strength 2 tabs every 8 hours. If you are still having pain please call the office.    

## 2012-11-14 NOTE — Progress Notes (Signed)
  Subjective:    Patient ID: Henry [name], male    DOB: 10/22/1934, 78 y.o.   MRN: [id number]  HPI Comments: This is a 11 rolled male status post acromioplasty subacromial decompression right shoulder in 2005 with findings of degenerative type I SLAP lesion synovitis of the glenohumeral joint fraying of the subscapularis tendon severe or size type II acromion. He did well up until recently and now presents with gradual onset of dull 5/10 constant shoulder pain which is worse when he picks up objects away from his body. However the pain is over his a.c. joint. He reports some catching with shoulder range of motion  Shoulder Pain       Review of Systems  Gastrointestinal:       Heartburn       Objective:   Physical Exam  Nursing note and vitals reviewed. Constitutional: He is oriented to person, place, and time. He appears well-developed and well-nourished. No distress.  Neck: Neck supple.  Cardiovascular: Intact distal pulses.   Lymphadenopathy:    He has no cervical adenopathy.  Neurological: He is alert and oriented to person, place, and time. He has normal reflexes. He exhibits normal muscle tone.  Skin: He is not diaphoretic.  Psychiatric: He has a normal mood and affect. His behavior is normal. Judgment and thought content normal.   Right Shoulder Exam   Tenderness  The patient is experiencing tenderness in the acromioclavicular joint.  Range of Motion  The patient has normal right shoulder ROM.  Muscle Strength  Abduction: 5/5  Internal Rotation: 5/5  External Rotation: 5/5  Supraspinatus: 4/5  Subscapularis: 5/5  Biceps: 5/5   Tests  Apprehension: negative Cross Arm: positive Drop Arm: negative Hawkin's test: negative Impingement: positive Sulcus: absent  Other  Erythema: absent Scars: absent Sensation: normal Pulse: present           Assessment & Plan:  X-ray shows large impinging osteophyte near the a.c. joint   A.c. joint  arthritis  Subacromial injection  Followup 6 weeks  Shoulder Injection Procedure Note   Pre-operative Diagnosis: right  RC Syndrome  Post-operative Diagnosis: same  Indications: pain   Anesthesia: ethyl chloride   Procedure Details   Verbal consent was obtained for the procedure. The shoulder was prepped withalcohol and the skin was anesthetized. A 20 gauge needle was advanced into the subacromial space through posterior approach without difficulty  The space was then injected with 3 ml 1% lidocaine and 1 ml of depomedrol. The injection site was cleansed with isopropyl alcohol and a dressing was applied.  Complications:  None; patient tolerated the procedure well.

## 2012-11-25 ENCOUNTER — Telehealth: Payer: Self-pay | Admitting: Orthopedic Surgery

## 2012-11-25 ENCOUNTER — Other Ambulatory Visit (HOSPITAL_COMMUNITY): Payer: Self-pay | Admitting: General Surgery

## 2012-11-25 DIAGNOSIS — M779 Enthesopathy, unspecified: Secondary | ICD-10-CM

## 2012-11-25 DIAGNOSIS — M25511 Pain in right shoulder: Secondary | ICD-10-CM

## 2012-11-25 NOTE — Telephone Encounter (Signed)
Patient stopped into office this morning, 11:10a.m, to inquire about when an MRI is to be scheduled for his right shoulder.  States he and Dr. Malvin Johns discussed it, per Dr. Malvin Johns with Dr. Romeo Apple.  No notes in [facility] system that I am locating.  Patient's next scheduled appointment for re-check of shoulder is 12/26/12.  Please advise.  Patient cell ph#[phone].

## 2012-11-28 ENCOUNTER — Ambulatory Visit (HOSPITAL_COMMUNITY)
Admission: RE | Admit: 2012-11-28 | Discharge: 2012-11-28 | Disposition: A | Discharge status: Home / Self Care | Payer: Medicare Other | Source: Ambulatory Visit | Attending: General Surgery | Admitting: General Surgery

## 2012-11-28 ENCOUNTER — Encounter (HOSPITAL_COMMUNITY): Payer: Self-pay

## 2012-11-28 DIAGNOSIS — IMO0002 Reserved for concepts with insufficient information to code with codable children: Secondary | ICD-10-CM | POA: Insufficient documentation

## 2012-11-28 DIAGNOSIS — M249 Joint derangement, unspecified: Secondary | ICD-10-CM | POA: Insufficient documentation

## 2012-11-28 DIAGNOSIS — M25511 Pain in right shoulder: Secondary | ICD-10-CM

## 2012-11-28 DIAGNOSIS — M779 Enthesopathy, unspecified: Secondary | ICD-10-CM

## 2012-11-28 DIAGNOSIS — M25519 Pain in unspecified shoulder: Secondary | ICD-10-CM | POA: Insufficient documentation

## 2012-11-28 DIAGNOSIS — M75100 Unspecified rotator cuff tear or rupture of unspecified shoulder, not specified as traumatic: Secondary | ICD-10-CM | POA: Insufficient documentation

## 2012-12-02 ENCOUNTER — Telehealth: Payer: Self-pay | Admitting: Orthopedic Surgery

## 2012-12-02 NOTE — Telephone Encounter (Signed)
Yes see tomorrow

## 2012-12-02 NOTE — Telephone Encounter (Signed)
Henry [name] is scheduled to see you 12/26/12 for a 6 week follow up on his shoulder.  He has had the MRI done and is asking to move up the appointment for either this week or next.  Ok to move up?

## 2012-12-02 NOTE — Telephone Encounter (Signed)
Done, scheduled for 1:45 12/03/12, patient aware

## 2012-12-03 ENCOUNTER — Encounter: Payer: Self-pay | Admitting: Orthopedic Surgery

## 2012-12-03 ENCOUNTER — Ambulatory Visit (INDEPENDENT_AMBULATORY_CARE_PROVIDER_SITE_OTHER): Payer: Medicare Other | Admitting: Orthopedic Surgery

## 2012-12-03 VITALS — BP 147/74 | Ht 69.0 in | Wt 232.0 lb

## 2012-12-03 DIAGNOSIS — M19019 Primary osteoarthritis, unspecified shoulder: Secondary | ICD-10-CM

## 2012-12-03 DIAGNOSIS — M19011 Primary osteoarthritis, right shoulder: Secondary | ICD-10-CM

## 2012-12-03 NOTE — Patient Instructions (Signed)
Arthroscopy distal clavicle excision

## 2012-12-03 NOTE — Progress Notes (Signed)
Patient ID: Henry [name], male   DOB: 08-01-1934, 78 y.o.   MRN: [id number]  Chief Complaint  Patient presents with  . Follow-up    MRI results of right shoulder.   This is a 78yo male status post acromioplasty subacromial decompression right shoulder in 2005 with findings of degenerative type I SLAP lesion synovitis of the glenohumeral joint fraying of the subscapularis tendon type II acromion. He did well up until recently and now presents with gradual onset of dull 5/10 constant shoulder pain which is worse when he picks up objects away from his body. However the pain is over his a.c. joint. He reports some catching with shoulder range of motion  Summation of the reviewed MRI and report below basically he has a.c. joint pain a.c. joint arthritis with tenderness over the a.c. joint consistent with his MRI his subscapularis issue does not seem to be an issue at this time symptomatically with no pain with internal rotation against resistance no pain with external rotation and no tenderness over the biceps tendon  Rotator cuff: Mild infraspinatus and subscapularis tendinopathy/ tendinosis. Suspect chronic partial tear of the subscapularis fibers superiorly and chronic medial subluxation of the long head biceps tendon. There is moderate supraspinatus tendon tendinopathy/ tendinosis anteriorly with probable small interstitial tears. No full thickness retracted rotator cuff tear.   Muscles: Mild fatty atrophy of the superior aspect of the subscapularis muscle.   Biceps long head:  Intact.  Mild persistent medial subluxation.   Acromioclavicular Joint: Mild AC joint degenerative changes. The acromion is type 2 and shape. No lateral downsloping or undersurface spurring change.   Glenohumeral Joint: Moderate degenerative changes but no joint effusion. Mild synovitis.   Labrum:  Degenerative changes without obvious tear.   Bones:  No acute bony findings.   Minimal subacromial/ subdeltoid  bursitis.   IMPRESSION: 1. Probable chronic partial tear involving the upper fibers of the subscapularis tendon with chronic medial subluxation of the long head biceps tendon. 2. Moderate tendinopathy and interstitial tears involving the anterior aspect of the supraspinatus tendon. 3. No significant findings for bony impingement. 4. Labral degenerative changes without discrete tear.   Reexamination Blood pressure 147/74, height 5\' 9"  (1.753 m), weight 232 lb (105.235 kg). General appearance is normal, the patient is alert and oriented x3 with normal mood and affect. He is tender over the a.c. joint and with bringing his arm across his chest he does have some pain against resisted supraspinatus testing but he has no tenderness over the biceps tendon rotator cuff weakness is not a major finding  After discussion he would like to have something done about this surgically. It's interfering with his golf game he is having pain  A.c. joint arthritis right shoulder  Recommend arthroscopic distal clavicle excision right shoulder (Evaluate glenohumeral joint and biceps tendon at the time of surgery)

## 2012-12-04 ENCOUNTER — Encounter (HOSPITAL_COMMUNITY): Payer: Self-pay | Admitting: Pharmacy Technician

## 2012-12-09 ENCOUNTER — Telehealth: Payer: Self-pay | Admitting: Orthopedic Surgery

## 2012-12-09 ENCOUNTER — Encounter (HOSPITAL_COMMUNITY)
Admission: RE | Admit: 2012-12-09 | Discharge: 2012-12-09 | Disposition: A | Discharge status: Home / Self Care | Payer: Medicare Other | Source: Ambulatory Visit | Attending: Orthopedic Surgery | Admitting: Orthopedic Surgery

## 2012-12-09 ENCOUNTER — Encounter (HOSPITAL_COMMUNITY): Payer: Self-pay

## 2012-12-09 DIAGNOSIS — Z01810 Encounter for preprocedural cardiovascular examination: Secondary | ICD-10-CM | POA: Insufficient documentation

## 2012-12-09 DIAGNOSIS — Z01812 Encounter for preprocedural laboratory examination: Secondary | ICD-10-CM | POA: Insufficient documentation

## 2012-12-09 DIAGNOSIS — Z01818 Encounter for other preprocedural examination: Secondary | ICD-10-CM | POA: Insufficient documentation

## 2012-12-09 HISTORY — DX: Unspecified hearing loss, unspecified ear: H91.90

## 2012-12-09 LAB — BASIC METABOLIC PANEL
Chloride: 99 mEq/L (ref 96–112)
Creatinine, Ser: 1 mg/dL (ref 0.50–1.35)
GFR calc Af Amer: 82 mL/min — ABNORMAL LOW (ref >90)
GFR calc non Af Amer: 71 mL/min — ABNORMAL LOW (ref >90)

## 2012-12-09 LAB — HEMOGLOBIN AND HEMATOCRIT, BLOOD
HCT: 45.1 % (ref 39.0–52.0)
Hemoglobin: 14.7 g/dL (ref 13.0–17.0)

## 2012-12-09 NOTE — Patient Instructions (Signed)
Henry [name]  12/09/2012   Your procedure is scheduled on:  12/13/2012  Report to [facility] at  900  AM.  Call this number if you have problems the morning of surgery: [phone]   Remember:   Do not eat food or drink liquids after midnight.   Take these medicines the morning of surgery with A SIP OF WATER: norvasc, nexium, micardis, hydrodiuril   Do not wear jewelry, make-up or nail polish.  Do not wear lotions, powders, or perfumes.   Do not shave 48 hours prior to surgery. Men may shave face and neck.  Do not bring valuables to the hospital.  [facility] is not responsible  for any belongings or valuables.               Contacts, dentures or bridgework may not be worn into surgery.  Leave suitcase in the car. After surgery it may be brought to your room.  For patients admitted to the hospital, discharge time is determined by your treatment team.               Patients discharged the day of surgery will not be allowed to drive home.  Name and phone number of your driver: family  Special Instructions: Shower using CHG 2 nights before surgery and the night before surgery.  If you shower the day of surgery use CHG.  Use special wash - you have one bottle of CHG for all showers.  You should use approximately 1/3 of the bottle for each shower.   Please read over the following fact sheets that you were given: Pain Booklet, Coughing and Deep Breathing, Surgical Site Infection Prevention, Anesthesia Post-op Instructions and Care and Recovery After Surgery Shoulder Arthroscopy Because the shoulder is one of the most mobile joints, it is more prone to injury. It is a very shallow ball and socket joint located between the large bone in your upper arm (humerus) and the shoulder blade (scapula). Arthroscopy is a valuable test for evaluating and treating injuries involving the shoulder joint. Arthroscopy is a surgical technique which uses small incisions (cuts by the surgeon) to insert  a small telescope like instrument (arthroscope) and other tools into the shoulder. This allows the surgeon to look directly at the problem. When the arthroscope is in the joint, fluid is used to expand the joint space. This allows the surgeon to examine it more easily. The arthroscope then beams light into the joint and sends an image to a TV screen. As your surgeon examines your shoulder, he or she can also repair a number of problems found at the same time. Sometimes the procedure may change to an open surgery. This would happen if the problems are severe enough that they cannot be corrected with just arthroscopy. This is usually a very safe surgery. Rare complications include damage to nerves or blood vessels, excess bleeding, blood clots, infection, and rarely instrument failure. This is most often performed as a same day surgery. This means you will not have to stay in the hospital overnight. Recovery from this surgery is also much faster than having an open procedure. LET YOUR CAREGIVER KNOW ABOUT:  Allergies.  Medications taken including herbs, eye drops, over the counter medications, and creams.  Use of steroids (by mouth or creams).  Previous problems with anesthetics or novocaine.  Possibility of pregnancy, if this applies.  History of blood clots (thrombophlebitis).  History of bleeding or blood problems.  Previous surgery.  Other health problems.  Family history of anesthetic problems. BEFORE THE PROCEDURE   Stop all anti-inflammatory medications at least one week before surgery unless instructed otherwise. Tell your surgeon if you have been taking cortisone or other steroids.  Do not eat or drink after midnight or as instructed. Take medications as directed by your caregiver. You may have lab tests the morning of surgery.  You should be present 60 minutes prior to your procedure or as directed. PROCEDURE  You may have general (go to sleep) or local (numb the area)  anesthetic. Your surgeon will discuss this with you. During the procedure as discussed above, your surgeon may find a variety of problems which he or she can improve or correct using small instruments. When the procedure is finished the tiny incisions will be closed with stitches or tape. AFTER YOUR PROCEDURE  After surgery you will be taken to the recovery area. A nurse will watch and check your progress. Once you are awake, stable, and taking fluids well, barring other problems you will be allowed to go home.  Once home, apply an ice pack to your operative site for twenty minutes, three to four times per day, for two to three days. This may help with discomfort and keep the swelling down.  Use a sling and medications if prescribed or as instructed.  Unless your caregiver advises otherwise, move your arm and shoulder gently and frequently following the procedure. This can help prevent stiffness and swelling. REHABILITATION  Almost as important as your surgery is your rehabilitation. If physical therapy and exercises are prescribed by your surgeon, follow them diligently. Once comfortable and on your way to full use, do muscle strengthening exercises as instructed.  Only take over-the-counter or prescription medicines for pain, discomfort, or fever as directed by your caregiver. SEEK IMMEDIATE MEDICAL CARE IF:   There is redness, swelling, or increasing pain in the wound or joint.  You notice purulent (colored- pus-like) drainage coming from the wound.  An unexplained oral temperature above 102 F (38.9 C) develops.  You notice a foul smell coming from the wound or dressing.  There is a breaking open of the wound. The edges do not stay together after sutures or tape has been removed.  Persistent bleeding from the small incision. Document Released: 01/28/2000 Document Revised: 04/24/2011 Document Reviewed: 05/18/2008 [facility] Patient Information 2014 [place]. PATIENT  INSTRUCTIONS POST-ANESTHESIA  IMMEDIATELY FOLLOWING SURGERY:  Do not drive or operate machinery for the first twenty four hours after surgery.  Do not make any important decisions for twenty four hours after surgery or while taking narcotic pain medications or sedatives.  If you develop intractable nausea and vomiting or a severe headache please notify your doctor immediately.  FOLLOW-UP:  Please make an appointment with your surgeon as instructed. You do not need to follow up with anesthesia unless specifically instructed to do so.  WOUND CARE INSTRUCTIONS (if applicable):  Keep a dry clean dressing on the anesthesia/puncture wound site if there is drainage.  Once the wound has quit draining you may leave it open to air.  Generally you should leave the bandage intact for twenty four hours unless there is drainage.  If the epidural site drains for more than 36-48 hours please call the anesthesia department.  QUESTIONS?:  Please feel free to call your physician or the hospital operator if you have any questions, and they will be happy to assist you.

## 2012-12-09 NOTE — Telephone Encounter (Signed)
Regarding surgery scheduled at [facility] 12/13/12, CPT [phone], per primary insurer Medicare guidelines, no pre-authorization required; per [employer] website, www.[email or web address].[email or web address], prior authorization tool, no pre-authorization or approval is required, as it follows Medicare guidelines.

## 2012-12-09 NOTE — Progress Notes (Signed)
12/09/12 0909  OBSTRUCTIVE SLEEP APNEA  Have you ever been diagnosed with sleep apnea through a sleep study? No  Do you snore loudly (loud enough to be heard through closed doors)?  0  Do you often feel tired, fatigued, or sleepy during the daytime? 0  Has anyone observed you stop breathing during your sleep? 0  Do you have, or are you being treated for high blood pressure? 1  BMI more than 35 kg/m2? 1  Age over 76 years old? 1  Neck circumference greater than 40 cm/18 inches? 1  Gender: 1  Obstructive Sleep Apnea Score 5

## 2012-12-12 NOTE — H&P (Signed)
Subjective:     Patient ID: Henry [name], male    DOB: 07/25/1934, 78 y.o.   MRN: [id number]  HPI Comments: This is a 78 yo male status post acromioplasty subacromial decompression right shoulder in 2005 with findings of degenerative type I SLAP lesion synovitis of the glenohumeral joint fraying of the subscapularis tendon severe / type II acromion. He did well up until recently and now presents with gradual onset of dull 5/10 constant shoulder pain which is worse when he picks up objects away from his body. However the pain is over his a.c. joint. He reports some catching with shoulder range of motion  Shoulder Pain    Review of Systems  Gastrointestinal:        Heartburn  Past Medical History  Diagnosis Date  . Hypertension   . GERD (gastroesophageal reflux disease)   . Hypercholesteremia   . HOH (hard of hearing)    Past Surgical History  Procedure Laterality Date  . Shoulder surgery Right     torn cartiledge  . Esophagogastroduodenoscopy  10/28/2010    Procedure: ESOPHAGOGASTRODUODENOSCOPY (EGD);  Surgeon: Malissa Hippo, MD;  Location: [facility];  Service: Endoscopy;  Laterality: N/A;  10:30  . Colonoscopy    . Colonoscopy  05/17/2011    Procedure: COLONOSCOPY;  Surgeon: Malissa Hippo, MD;  Location: [facility];  Service: Endoscopy;  Laterality: N/A;  830   Family History  Problem Relation Age of Onset  . Colon cancer Neg Hx    History  Substance Use Topics  . Smoking status: Former Smoker -- 0.50 packs/day for 35 years    Types: Cigarettes    Quit date: 12/09/1968  . Smokeless tobacco: Not on file  . Alcohol Use: 1.8 oz/week    3 Glasses of wine per week        Objective:    Physical Exam  Nursing note and vitals reviewed. Constitutional: He is oriented to person, place, and time. He appears well-developed and well-nourished. No distress.  Neck: Neck supple.  Cardiovascular: Intact distal pulses.   Lymphadenopathy:    He has no cervical adenopathy.   Neurological: He is alert and oriented to person, place, and time. He has normal reflexes. He exhibits normal muscle tone.  Skin: He is not diaphoretic.  Psychiatric: He has a normal mood and affect. His behavior is normal. Judgment and thought content normal.   Right Shoulder Exam   Tenderness  The patient is experiencing tenderness in the acromioclavicular joint.  Range of Motion  The patient has normal right shoulder ROM.  Muscle Strength  Abduction: 5/5   Internal Rotation: 5/5   External Rotation: 5/5   Supraspinatus: 4/5   Subscapularis: 5/5   Biceps: 5/5   Tests  Apprehension: negative Cross Arm: positive Drop Arm: negative Hawkin's test: negative Impingement: positive Sulcus: absent  Other  Erythema: absent Scars: absent Sensation: normal Pulse: present      Assessment & Plan:   X-ray shows large impinging osteophyte near the a.c. joint   A.c. joint arthritis  Rotator cuff: Mild infraspinatus and subscapularis tendinopathy/ tendinosis. Suspect chronic partial tear of the subscapularis fibers superiorly and chronic medial subluxation of the long head biceps tendon. There is moderate supraspinatus tendon tendinopathy/ tendinosis anteriorly with probable small interstitial tears. No full thickness retracted rotator cuff tear.   Muscles: Mild fatty atrophy of the superior aspect of the subscapularis muscle.   Biceps long head:  Intact.  Mild persistent medial subluxation.  Acromioclavicular Joint: Mild AC joint degenerative changes. The acromion is type 2 and shape. No lateral downsloping or undersurface spurring change.   Glenohumeral Joint: Moderate degenerative changes but no joint effusion. Mild synovitis.   Labrum:  Degenerative changes without obvious tear.   Bones:  No acute bony findings.   Minimal subacromial/ subdeltoid bursitis.   IMPRESSION: 1. Probable chronic partial tear involving the upper fibers of the subscapularis tendon  with chronic medial subluxation of the long head biceps tendon. 2. Moderate tendinopathy and interstitial tears involving the anterior aspect of the supraspinatus tendon. 3. No significant findings for bony impingement. 4. Labral degenerative changes without discrete tear.  My plan is to do a shoulder arthroscopy evaluate the glenohumeral joint debris the leading edge of the subscapularis, resect the distal clavicle. Address the biceps tendon as needed.

## 2012-12-13 ENCOUNTER — Encounter (HOSPITAL_COMMUNITY)
Admission: RE | Disposition: A | Discharge status: Home / Self Care | Payer: Self-pay | Source: Ambulatory Visit | Attending: Orthopedic Surgery

## 2012-12-13 ENCOUNTER — Ambulatory Visit (HOSPITAL_COMMUNITY)
Admission: RE | Admit: 2012-12-13 | Discharge: 2012-12-13 | Disposition: A | Discharge status: Home / Self Care | Payer: Medicare Other | Source: Ambulatory Visit | Attending: Orthopedic Surgery | Admitting: Orthopedic Surgery

## 2012-12-13 ENCOUNTER — Encounter (HOSPITAL_COMMUNITY): Payer: Medicare Other | Admitting: Anesthesiology

## 2012-12-13 ENCOUNTER — Encounter (HOSPITAL_COMMUNITY): Payer: Self-pay | Admitting: *Deleted

## 2012-12-13 ENCOUNTER — Ambulatory Visit (HOSPITAL_COMMUNITY): Payer: Medicare Other | Admitting: Anesthesiology

## 2012-12-13 DIAGNOSIS — M779 Enthesopathy, unspecified: Secondary | ICD-10-CM | POA: Insufficient documentation

## 2012-12-13 DIAGNOSIS — M719 Bursopathy, unspecified: Secondary | ICD-10-CM | POA: Insufficient documentation

## 2012-12-13 DIAGNOSIS — M19019 Primary osteoarthritis, unspecified shoulder: Secondary | ICD-10-CM

## 2012-12-13 DIAGNOSIS — M67919 Unspecified disorder of synovium and tendon, unspecified shoulder: Secondary | ICD-10-CM | POA: Insufficient documentation

## 2012-12-13 DIAGNOSIS — S46819A Strain of other muscles, fascia and tendons at shoulder and upper arm level, unspecified arm, initial encounter: Secondary | ICD-10-CM

## 2012-12-13 DIAGNOSIS — S46211A Strain of muscle, fascia and tendon of other parts of biceps, right arm, initial encounter: Secondary | ICD-10-CM

## 2012-12-13 DIAGNOSIS — S43429A Sprain of unspecified rotator cuff capsule, initial encounter: Secondary | ICD-10-CM | POA: Insufficient documentation

## 2012-12-13 DIAGNOSIS — I10 Essential (primary) hypertension: Secondary | ICD-10-CM | POA: Insufficient documentation

## 2012-12-13 DIAGNOSIS — S43499A Other sprain of unspecified shoulder joint, initial encounter: Secondary | ICD-10-CM

## 2012-12-13 DIAGNOSIS — S46811A Strain of other muscles, fascia and tendons at shoulder and upper arm level, right arm, initial encounter: Secondary | ICD-10-CM

## 2012-12-13 DIAGNOSIS — S4381XA Sprain of other specified parts of right shoulder girdle, initial encounter: Secondary | ICD-10-CM | POA: Diagnosis present

## 2012-12-13 DIAGNOSIS — S46119A Strain of muscle, fascia and tendon of long head of biceps, unspecified arm, initial encounter: Secondary | ICD-10-CM

## 2012-12-13 HISTORY — PX: SHOULDER ARTHROSCOPY WITH BICEPSTENOTOMY: SHX6204

## 2012-12-13 HISTORY — PX: RESECTION DISTAL CLAVICAL: SHX5053

## 2012-12-13 SURGERY — EXCISION, CLAVICLE, DISTAL, OPEN
Anesthesia: General | Site: Shoulder | Laterality: Right | Wound class: Clean

## 2012-12-13 MED ORDER — LIDOCAINE HCL (PF) 1 % IJ SOLN
INTRAMUSCULAR | Status: AC
  Filled 2012-12-13: qty 5

## 2012-12-13 MED ORDER — DEXAMETHASONE SODIUM PHOSPHATE 4 MG/ML IJ SOLN
INTRAMUSCULAR | Status: AC
  Filled 2012-12-13: qty 1

## 2012-12-13 MED ORDER — BUPIVACAINE-EPINEPHRINE PF 0.5-1:200000 % IJ SOLN
INTRAMUSCULAR | Status: AC
  Filled 2012-12-13: qty 20

## 2012-12-13 MED ORDER — MIDAZOLAM HCL 2 MG/2ML IJ SOLN
INTRAMUSCULAR | Status: AC
  Filled 2012-12-13: qty 2

## 2012-12-13 MED ORDER — GLYCOPYRROLATE 0.2 MG/ML IJ SOLN
INTRAMUSCULAR | Status: AC
  Filled 2012-12-13: qty 2

## 2012-12-13 MED ORDER — ONDANSETRON HCL 4 MG/2ML IJ SOLN: 4.0000 mg | Freq: Once | INTRAMUSCULAR | Status: DC | PRN

## 2012-12-13 MED ORDER — PROPOFOL 10 MG/ML IV BOLUS
INTRAVENOUS | Status: AC
  Filled 2012-12-13: qty 20

## 2012-12-13 MED ORDER — CEFAZOLIN SODIUM-DEXTROSE 2-3 GM-% IV SOLR
2.0000 g | INTRAVENOUS | Status: AC
  Administered 2012-12-13: 2 g via INTRAVENOUS

## 2012-12-13 MED ORDER — SUCCINYLCHOLINE CHLORIDE 20 MG/ML IJ SOLN
INTRAMUSCULAR | Status: AC
Start: 2012-12-13 — End: 2012-12-13
  Filled 2012-12-13: qty 1

## 2012-12-13 MED ORDER — NEOSTIGMINE METHYLSULFATE 1 MG/ML IJ SOLN
INTRAMUSCULAR | Status: AC
  Filled 2012-12-13: qty 1

## 2012-12-13 MED ORDER — LACTATED RINGERS IV SOLN
INTRAVENOUS | Status: DC
  Administered 2012-12-13 (×2): via INTRAVENOUS

## 2012-12-13 MED ORDER — BUPIVACAINE-EPINEPHRINE 0.5% -1:200000 IJ SOLN
INTRAMUSCULAR | Status: DC | PRN
  Administered 2012-12-13: 60 mL

## 2012-12-13 MED ORDER — PROMETHAZINE HCL 12.5 MG PO TABS: 12.5000 mg | ORAL_TABLET | Freq: Four times a day (QID) | ORAL | Status: DC | PRN

## 2012-12-13 MED ORDER — FENTANYL CITRATE 0.05 MG/ML IJ SOLN
INTRAMUSCULAR | Status: DC | PRN
  Administered 2012-12-13: 50 ug via INTRAVENOUS
  Administered 2012-12-13: 75 ug via INTRAVENOUS
  Administered 2012-12-13: 50 ug via INTRAVENOUS
  Administered 2012-12-13: 25 ug via INTRAVENOUS
  Administered 2012-12-13: 50 ug via INTRAVENOUS

## 2012-12-13 MED ORDER — EPHEDRINE SULFATE 50 MG/ML IJ SOLN
INTRAMUSCULAR | Status: DC | PRN
  Administered 2012-12-13: 10 mg via INTRAVENOUS
  Administered 2012-12-13 (×2): 5 mg via INTRAVENOUS
  Administered 2012-12-13 (×2): 10 mg via INTRAVENOUS

## 2012-12-13 MED ORDER — SODIUM CHLORIDE 0.9 % IR SOLN
Status: DC | PRN
  Administered 2012-12-13: 1000 mL

## 2012-12-13 MED ORDER — NEOSTIGMINE METHYLSULFATE 1 MG/ML IJ SOLN
INTRAMUSCULAR | Status: DC | PRN
  Administered 2012-12-13: 2 mg via INTRAVENOUS

## 2012-12-13 MED ORDER — ROCURONIUM BROMIDE 100 MG/10ML IV SOLN
INTRAVENOUS | Status: DC | PRN
  Administered 2012-12-13: 10 mg via INTRAVENOUS
  Administered 2012-12-13: 5 mg via INTRAVENOUS
  Administered 2012-12-13: 25 mg via INTRAVENOUS

## 2012-12-13 MED ORDER — SODIUM CHLORIDE 0.9 % IR SOLN
Status: DC | PRN
  Administered 2012-12-13 (×5)

## 2012-12-13 MED ORDER — ONDANSETRON HCL 4 MG/2ML IJ SOLN
4.0000 mg | Freq: Once | INTRAMUSCULAR | Status: AC
  Administered 2012-12-13: 4 mg via INTRAVENOUS

## 2012-12-13 MED ORDER — METHOCARBAMOL 500 MG PO TABS: 500.0000 mg | ORAL_TABLET | Freq: Three times a day (TID) | ORAL | Status: DC

## 2012-12-13 MED ORDER — PROPOFOL 10 MG/ML IV BOLUS
INTRAVENOUS | Status: DC | PRN
  Administered 2012-12-13: 180 mg via INTRAVENOUS

## 2012-12-13 MED ORDER — CEFAZOLIN SODIUM-DEXTROSE 2-3 GM-% IV SOLR
INTRAVENOUS | Status: AC
  Filled 2012-12-13: qty 50

## 2012-12-13 MED ORDER — ROCURONIUM BROMIDE 50 MG/5ML IV SOLN
INTRAVENOUS | Status: AC
  Filled 2012-12-13: qty 1

## 2012-12-13 MED ORDER — CHLORHEXIDINE GLUCONATE 4 % EX LIQD: 60.0000 mL | Freq: Once | CUTANEOUS | Status: DC

## 2012-12-13 MED ORDER — GLYCOPYRROLATE 0.2 MG/ML IJ SOLN
INTRAMUSCULAR | Status: DC | PRN
  Administered 2012-12-13: .4 mg via INTRAVENOUS

## 2012-12-13 MED ORDER — LIDOCAINE HCL (CARDIAC) 20 MG/ML IV SOLN
INTRAVENOUS | Status: DC | PRN
  Administered 2012-12-13: 30 mg via INTRAVENOUS

## 2012-12-13 MED ORDER — ONDANSETRON HCL 4 MG/2ML IJ SOLN
INTRAMUSCULAR | Status: AC
  Filled 2012-12-13: qty 2

## 2012-12-13 MED ORDER — FENTANYL CITRATE 0.05 MG/ML IJ SOLN
INTRAMUSCULAR | Status: AC
  Filled 2012-12-13: qty 5

## 2012-12-13 MED ORDER — SUCCINYLCHOLINE CHLORIDE 20 MG/ML IJ SOLN
INTRAMUSCULAR | Status: DC | PRN
  Administered 2012-12-13: 120 mg via INTRAVENOUS

## 2012-12-13 MED ORDER — MIDAZOLAM HCL 2 MG/2ML IJ SOLN
1.0000 mg | INTRAMUSCULAR | Status: DC | PRN
  Administered 2012-12-13 (×2): 2 mg via INTRAVENOUS

## 2012-12-13 MED ORDER — EPINEPHRINE HCL 1 MG/ML IJ SOLN
INTRAMUSCULAR | Status: AC
  Filled 2012-12-13: qty 10

## 2012-12-13 MED ORDER — DEXAMETHASONE SODIUM PHOSPHATE 4 MG/ML IJ SOLN
4.0000 mg | Freq: Once | INTRAMUSCULAR | Status: AC
  Administered 2012-12-13: 4 mg via INTRAVENOUS

## 2012-12-13 MED ORDER — HYDROCODONE-ACETAMINOPHEN 7.5-325 MG PO TABS: 1.0000 | ORAL_TABLET | ORAL | Status: DC | PRN

## 2012-12-13 MED ORDER — FENTANYL CITRATE 0.05 MG/ML IJ SOLN
25.0000 ug | INTRAMUSCULAR | Status: DC | PRN
  Administered 2012-12-13: 50 ug via INTRAVENOUS
  Filled 2012-12-13 (×2): qty 2

## 2012-12-13 SURGICAL SUPPLY — 88 items
APL SKNCLS STERI-STRIP NONHPOA (GAUZE/BANDAGES/DRESSINGS)
BAG HAMPER (MISCELLANEOUS) ×3 IMPLANT
BENZOIN TINCTURE PRP APPL 2/3 (GAUZE/BANDAGES/DRESSINGS) ×1 IMPLANT
BLADE AGGRESSIVE PLUS 4.0 (BLADE) ×3 IMPLANT
BLADE AVERAGE 25X9 (BLADE) ×2 IMPLANT
BLADE HEX COATED 2.75 (ELECTRODE) ×3 IMPLANT
BLADE SURG SZ10 CARB STEEL (BLADE) ×3 IMPLANT
BLADE SURG SZ11 CARB STEEL (BLADE) ×3 IMPLANT
BNDG COHESIVE 4X5 TAN STRL (GAUZE/BANDAGES/DRESSINGS) ×3 IMPLANT
BUR 5.0 BARRELL (BURR) IMPLANT
BUR BARRELL 4.0 (BURR) ×2 IMPLANT
BUR ROUND 5.0 (BURR) IMPLANT
CANNULA DRILOCK 5.0X75 (CANNULA) ×2 IMPLANT
CANNULA DRILOCK 6.5X75 (CANNULA) ×2 IMPLANT
CANNULA DRILOCK 8.0X75 (CANNULA) IMPLANT
CHLORAPREP W/TINT 26ML (MISCELLANEOUS) ×3 IMPLANT
CLOTH BEACON ORANGE TIMEOUT ST (SAFETY) ×3 IMPLANT
COVER LIGHT HANDLE STERIS (MISCELLANEOUS) ×6 IMPLANT
COVER PROBE W GEL 5X96 (DRAPES) ×1 IMPLANT
DECANTER SPIKE VIAL GLASS SM (MISCELLANEOUS) ×6 IMPLANT
DRAPE PROXIMA HALF (DRAPES) ×3 IMPLANT
DRAPE SHOULDER BEACH CHAIR (DRAPES) ×3 IMPLANT
DRAPE U-SHAPE 47X51 STRL (DRAPES) ×3 IMPLANT
DRSG MEPILEX BORDER 4X8 (GAUZE/BANDAGES/DRESSINGS) ×5 IMPLANT
ELECT REM PT RETURN 9FT ADLT (ELECTROSURGICAL) ×3
ELECTRODE REM PT RTRN 9FT ADLT (ELECTROSURGICAL) ×2 IMPLANT
FIBERSTICK 2 (SUTURE) IMPLANT
GAUZE SPONGE 4X4 16PLY XRAY LF (GAUZE/BANDAGES/DRESSINGS) ×3 IMPLANT
GLOVE ECLIPSE 6.5 STRL STRAW (GLOVE) ×2 IMPLANT
GLOVE ECLIPSE 7.0 STRL STRAW (GLOVE) ×2 IMPLANT
GLOVE INDICATOR 7.0 STRL GRN (GLOVE) ×4 IMPLANT
GLOVE SKINSENSE NS SZ8.0 LF (GLOVE) ×2
GLOVE SKINSENSE STRL SZ8.0 LF (GLOVE) ×3 IMPLANT
GLOVE SS N UNI LF 8.5 STRL (GLOVE) ×3 IMPLANT
GOWN STRL REIN XL XLG (GOWN DISPOSABLE) ×9 IMPLANT
INST SET MINOR BONE (KITS) ×3 IMPLANT
IV NS IRRIG 3000ML ARTHROMATIC (IV SOLUTION) ×12 IMPLANT
KIT BLADEGUARD II DBL (SET/KITS/TRAYS/PACK) ×3 IMPLANT
KIT POSITION SHOULDER SCHLEI (MISCELLANEOUS) ×3 IMPLANT
KIT ROOM TURNOVER APOR (KITS) ×3 IMPLANT
MANIFOLD NEPTUNE II (INSTRUMENTS) ×3 IMPLANT
MARKER SKIN DUAL TIP RULER LAB (MISCELLANEOUS) ×5 IMPLANT
NDL HYPO 21X1.5 SAFETY (NEEDLE) ×1 IMPLANT
NDL MAYO 6 CRC TAPER PT (NEEDLE) IMPLANT
NDL SCORPION (NEEDLE) IMPLANT
NDL SPNL 18GX3.5 QUINCKE PK (NEEDLE) ×1 IMPLANT
NEEDLE HYPO 21X1.5 SAFETY (NEEDLE) ×3 IMPLANT
NEEDLE MAYO 6 CRC TAPER PT (NEEDLE) IMPLANT
NEEDLE SCORPION (NEEDLE) IMPLANT
NEEDLE SPNL 18GX3.5 QUINCKE PK (NEEDLE) ×3 IMPLANT
NS IRRIG 1000ML POUR BTL (IV SOLUTION) ×3 IMPLANT
PACK BASIC III (CUSTOM PROCEDURE TRAY) ×3
PACK SRG BSC III STRL LF ECLPS (CUSTOM PROCEDURE TRAY) ×2 IMPLANT
PAD ARMBOARD 7.5X6 YLW CONV (MISCELLANEOUS) ×3 IMPLANT
PASSER SUT CAPTURE FIRST (SUTURE) IMPLANT
PENCIL HANDSWITCHING (ELECTRODE) ×3 IMPLANT
RASP SM TEAR CROSS CUT (RASP) IMPLANT
SET ARTHROSCOPY INST (INSTRUMENTS) ×3 IMPLANT
SET BASIN LINEN APH (SET/KITS/TRAYS/PACK) ×3 IMPLANT
SLING ARM FOAM STRAP LRG (SOFTGOODS) ×2 IMPLANT
SLING ARM FOAM STRAP MED (SOFTGOODS) IMPLANT
SPONGE LAP 18X18 X RAY DECT (DISPOSABLE) ×3 IMPLANT
STOCKINETTE IMPERVIOUS LG (DRAPES) ×3 IMPLANT
STRIP CLOSURE SKIN 1/2X4 (GAUZE/BANDAGES/DRESSINGS) IMPLANT
SUT BONE WAX W31G (SUTURE) IMPLANT
SUT ETHIBOND NAB OS 4 #2 30IN (SUTURE) IMPLANT
SUT ETHILON 3 0 FSL (SUTURE) ×2 IMPLANT
SUT FIBERWIRE #2 38 REV NDL BL (SUTURE)
SUT FIBERWIRE #2 38 T-5 BLUE (SUTURE)
SUT LASSO 45 DEGREE (SUTURE) IMPLANT
SUT LASSO 45 DEGREE LEFT (SUTURE) IMPLANT
SUT LASSO 45D RIGHT (SUTURE) IMPLANT
SUT MON AB 0 CT1 (SUTURE) ×1 IMPLANT
SUT MON AB 2-0 CT1 36 (SUTURE) ×2 IMPLANT
SUT PROLENE 2 0 FS (SUTURE) IMPLANT
SUT PROLENE 2 0 SH 30 (SUTURE) IMPLANT
SUT PROLENE 3 0 PS 1 (SUTURE) IMPLANT
SUT VIC AB 1 CT1 27 (SUTURE)
SUT VIC AB 1 CT1 27XBRD ANTBC (SUTURE) IMPLANT
SUTURE FIBERWR #2 38 T-5 BLUE (SUTURE) IMPLANT
SUTURE FIBERWR#2 38 REV NDL BL (SUTURE) IMPLANT
SYR 30ML LL (SYRINGE) ×3 IMPLANT
SYR BULB IRRIGATION 50ML (SYRINGE) ×6 IMPLANT
TOWEL OR 17X26 4PK STRL BLUE (TOWEL DISPOSABLE) ×3 IMPLANT
TUBING ARTHROSCOPY INFLOW/OUT (IRRIGATION / IRRIGATOR) ×3 IMPLANT
WAND 90 DEG TURBOVAC W/CORD (SURGICAL WAND) ×3 IMPLANT
YANKAUER SUCT 12FT TUBE ARGYLE (SUCTIONS) ×2 IMPLANT
YANKAUER SUCT BULB TIP 10FT TU (MISCELLANEOUS) ×10 IMPLANT

## 2012-12-13 NOTE — Transfer of Care (Signed)
Immediate Anesthesia Transfer of Care Note  Patient: Henry [name]  Procedure(s) Performed: Procedure(s): RESECTION DISTAL CLAVICAL (Right) SHOULDER ARTHROSCOPY WITH BICEPS TENOTOMY, limited shoulder debridement (Right)  Patient Location: PACU  Anesthesia Type:General  Level of Consciousness: awake and alert   Airway & Oxygen Therapy: Patient Spontanous Breathing  Post-op Assessment: Report given to PACU RN  Post vital signs: Reviewed and stable  Complications: No apparent anesthesia complications

## 2012-12-13 NOTE — Interval H&P Note (Signed)
History and Physical Interval Note:  12/13/2012 10:17 AM  Henry [name]  has presented today for surgery, with the diagnosis of arthritis right shoulder  The various methods of treatment have been discussed with the patient and family. After consideration of risks, benefits and other options for treatment, the patient has consented to  Procedure(s): SHOULDER ARTHROSCOPY WITH DISTAL CLAVICLE RESECTION (Right) as a surgical intervention .  The patient's history has been reviewed, patient examined, no change in status, stable for surgery.  I have reviewed the patient's chart and labs.  Questions were answered to the patient's satisfaction.     Fuller [place] IMPRESSION: 1. Probable chronic partial tear involving the upper fibers of the subscapularis tendon with chronic medial subluxation of the long head biceps tendon. 2. Moderate tendinopathy and interstitial tears involving the anterior aspect of the supraspinatus tendon. 3. No significant findings for bony impingement. 4. Labral degenerative changes without discrete tear.

## 2012-12-13 NOTE — Brief Op Note (Addendum)
12/13/2012  12:56 PM  PATIENT:  Henry [name]  78 y.o. male  PRE-OPERATIVE DIAGNOSIS:  arthritis right shoulder  POST-OPERATIVE DIAGNOSIS:   right shoulder, biceps tendonitis/partial tear, rotator cuff tendonitis, partial subscapularis tear, arthritis of distal clavicle  Operative findings. #1 intrasubstance tear Biceps tendon greater than 50% #2 leading edge subscapularis tear #3 rotator cuff tendinopathy and tendinitis #4 arthritis of the distal clavicle and a.c. Joint   PROCEDURE:  Procedure(s): RESECTION DISTAL CLAVICAL (Right) SHOULDER ARTHROSCOPY WITH BICEPS TENOTOMY, limited shoulder debridement (Right)  #1 procedure was shoulder arthroscopy with biceps tenotomy and limited debridement. The debridement involved the leading edge of the subscapularis and the subacromial bursa  #2 open distal clavicle excision  SURGEON:  Surgeon(s) and Role:    * Vickki Hearing, MD - Primary  PHYSICIAN ASSISTANT:   ASSISTANTS: [place]   ANESTHESIA:   general  EBL:  Total I/O In: 1000 [I.V.:1000] Out: 0   BLOOD ADMINISTERED:none  DRAINS: none   LOCAL MEDICATIONS USED:  OTHER 0.5% Marcaine with epinephrine 60 cc  SPECIMEN:  Distal clavicle  DISPOSITION OF SPECIMEN:  PATHOLOGY  COUNTS:  YES  TOURNIQUET:  * No tourniquets in log *  DICTATION: .Dragon Dictation  PLAN OF CARE: Discharge to home after PACU  PATIENT DISPOSITION:  PACU - hemodynamically stable.   Delay start of Pharmacological VTE agent (>24hrs) due to surgical blood loss or risk of bleeding: Not applicable  Details of procedure. The patient was identified in the preop holding area the site was confirmed as right shoulder and was marked as such. Chart review completed.  Patient taken to surgery given 2 g of IV Ancef placed supine underwent intubation. He was placed in the modified beachchair position with Schlein positioner.  Sterile prep and drape completed.  Timeout completed  A posterior  portal was established and the scope was placed into the glenohumeral joint. Diagnostic arthroscopy revealed no glenohumeral arthritis fraying of the labrum degenerative. Intact superior labrum with torn intrasubstance tendon of the biceps. The leading edge of the subscapularis was also torn and degenerated.  An anterior portal was established with a spinal needle and the subscapularis tear was debrided. The biceps tendon was brought into the joint and the subligamentous portion was also partially torn and degenerated. A biceps tenotomy and debridement was performed.  The rotator cuff attachment was intact  The scope was then placed in the subacromial space. The superior rotator cuff was also intact although severely diseased with significant and severe tendinopathy without tearing at the tuberosity  A debridement was performed of the subacromial bursa. The a.c. joint was identified with a spinal needle. After debridement of the interim posterior portion of the distal clavicle a large spur was found impinging on the rotator cuff.  A superior portal was established, however secondary to poor visualization the a.c. joint was opened.  The superior portal was extended anteriorly and posteriorly. Subcutaneous tissue was divided down to the a.c. joint. The soft tissue was elevated over the acromion and distal clavicle and then 7 mm of distal clavicle was resected with the oscillating saw. This was then contoured with an power rasp. Hemostasis was obtained with electrocautery. Bone wax was used over the distal end of the clavicle. Finger dissection and palpation confirmed a smooth the inferior edge.  The distal clavicle was found to have a large impinging osteophyte which was removed as part of the specimen.  The wound was irrigated and closed with 2-0 Monocryl 3-0 running nylon suture  and Steri-Strips. The portals were then closed with 3-0 nylon suture.  The wounds were injected with 60 cc of Marcaine  with epinephrine  Sterile dressings were applied the patient was placed in a sling. He was then extubated and taken to recovery with stable condition

## 2012-12-13 NOTE — Anesthesia Preprocedure Evaluation (Signed)
Anesthesia Evaluation  Patient identified by MRN, date of birth, ID band Patient awake    Reviewed: Allergy & Precautions, H&P , NPO status , Patient's Chart, lab work & pertinent test results  History of Anesthesia Complications Negative for: history of anesthetic complications  Airway Mallampati: II TM Distance: >3 FB     Dental  (+) Partial Lower and Teeth Intact   Pulmonary neg pulmonary ROS, former smoker,  breath sounds clear to auscultation        Cardiovascular hypertension, Pt. on medications Rhythm:Regular Rate:Normal     Neuro/Psych    GI/Hepatic GERD-  Controlled and Medicated,  Endo/Other    Renal/GU      Musculoskeletal   Abdominal   Peds  Hematology   Anesthesia Other Findings   Reproductive/Obstetrics                           Anesthesia Physical Anesthesia Plan  ASA: II  Anesthesia Plan: General   Post-op Pain Management:    Induction: Intravenous  Airway Management Planned: Oral ETT  Additional Equipment:   Intra-op Plan:   Post-operative Plan: Extubation in OR  Informed Consent: I have reviewed the patients History and Physical, chart, labs and discussed the procedure including the risks, benefits and alternatives for the proposed anesthesia with the patient or authorized representative who has indicated his/her understanding and acceptance.     Plan Discussed with:   Anesthesia Plan Comments:         Anesthesia Quick Evaluation

## 2012-12-13 NOTE — Anesthesia Procedure Notes (Signed)
Procedure Name: Intubation Date/Time: 12/13/2012 11:04 AM Performed by: Franco Nones Pre-anesthesia Checklist: Patient identified, Patient being monitored, Timeout performed, Emergency Drugs available and Suction available Patient Re-evaluated:Patient Re-evaluated prior to inductionOxygen Delivery Method: Circle System Utilized Preoxygenation: Pre-oxygenation with 100% oxygen Intubation Type: IV induction and Cricoid Pressure applied Ventilation: Mask ventilation without difficulty Laryngoscope Size: Mac and 3 Grade View: Grade I Tube type: Oral Tube size: 7.0 mm Number of attempts: 1 Airway Equipment and Method: stylet Placement Confirmation: ETT inserted through vocal cords under direct vision,  positive ETCO2 and breath sounds checked- equal and bilateral Secured at: 21 cm Tube secured with: Tape Dental Injury: Teeth and Oropharynx as per pre-operative assessment  Comments: Intubation performed by K.Daniel Scientist, clinical (histocompatibility and immunogenetics).Documentation by T.Azuree Minish CRNA

## 2012-12-13 NOTE — Anesthesia Postprocedure Evaluation (Signed)
  Anesthesia Post-op Note  Patient: Henry [name]  Procedure(s) Performed: Procedure(s): RESECTION DISTAL CLAVICAL (Right) SHOULDER ARTHROSCOPY WITH BICEPS TENOTOMY, limited shoulder debridement (Right)  Patient Location: PACU  Anesthesia Type:General  Level of Consciousness: awake, alert  and oriented  Airway and Oxygen Therapy: Patient Spontanous Breathing  Post-op Pain: none  Post-op Assessment: Post-op Vital signs reviewed, Patient's Cardiovascular Status Stable, Respiratory Function Stable, Patent Airway and No signs of Nausea or vomiting  Post-op Vital Signs: Reviewed and stable  Complications: No apparent anesthesia complications

## 2012-12-13 NOTE — Op Note (Signed)
12/13/2012  12:56 PM  PATIENT:  Henry [name]  78 y.o. male  PRE-OPERATIVE DIAGNOSIS:  arthritis right shoulder  POST-OPERATIVE DIAGNOSIS:   right shoulder, biceps tendonitis/partial tear, rotator cuff tendonitis, partial subscapularis tear, arthritis of distal clavicle  Operative findings. #1 intrasubstance tear Biceps tendon greater than 50% #2 leading edge subscapularis tear #3 rotator cuff tendinopathy and tendinitis #4 arthritis of the distal clavicle and a.c. Joint   PROCEDURE:  Procedure(s): RESECTION DISTAL CLAVICAL (Right) SHOULDER ARTHROSCOPY WITH BICEPS TENOTOMY, limited shoulder debridement (Right)  #1 procedure was shoulder arthroscopy with biceps tenotomy and limited debridement. The debridement involved the leading edge of the subscapularis and the subacromial bursa  #2 open distal clavicle excision  SURGEON:  Surgeon(s) and Role:    * Yeira Gulden E Martinez Boxx, MD - Primary  PHYSICIAN ASSISTANT:   ASSISTANTS: Betty Ashley   ANESTHESIA:   general  EBL:  Total I/O In: 1000 [I.V.:1000] Out: 0   BLOOD ADMINISTERED:none  DRAINS: none   LOCAL MEDICATIONS USED:  OTHER 0.5% Marcaine with epinephrine 60 cc  SPECIMEN:  Distal clavicle  DISPOSITION OF SPECIMEN:  PATHOLOGY  COUNTS:  YES  TOURNIQUET:  * No tourniquets in log *  DICTATION: .Dragon Dictation  PLAN OF CARE: Discharge to home after PACU  PATIENT DISPOSITION:  PACU - hemodynamically stable.   Delay start of Pharmacological VTE agent (>24hrs) due to surgical blood loss or risk of bleeding: Not applicable  Details of procedure. The patient was identified in the preop holding area the site was confirmed as right shoulder and was marked as such. Chart review completed.  Patient taken to surgery given 2 g of IV Ancef placed supine underwent intubation. He was placed in the modified beachchair position with Schlein positioner.  Sterile prep and drape completed.  Timeout completed  A posterior  portal was established and the scope was placed into the glenohumeral joint. Diagnostic arthroscopy revealed no glenohumeral arthritis fraying of the labrum degenerative. Intact superior labrum with torn intrasubstance tendon of the biceps. The leading edge of the subscapularis was also torn and degenerated.  An anterior portal was established with a spinal needle and the subscapularis tear was debrided. The biceps tendon was brought into the joint and the subligamentous portion was also partially torn and degenerated. A biceps tenotomy and debridement was performed.  The rotator cuff attachment was intact  The scope was then placed in the subacromial space. The superior rotator cuff was also intact although severely diseased with significant and severe tendinopathy without tearing at the tuberosity  A debridement was performed of the subacromial bursa. The a.c. joint was identified with a spinal needle. After debridement of the interim posterior portion of the distal clavicle a large spur was found impinging on the rotator cuff.  A superior portal was established, however secondary to poor visualization the a.c. joint was opened.  The superior portal was extended anteriorly and posteriorly. Subcutaneous tissue was divided down to the a.c. joint. The soft tissue was elevated over the acromion and distal clavicle and then 7 mm of distal clavicle was resected with the oscillating saw. This was then contoured with an power rasp. Hemostasis was obtained with electrocautery. Bone wax was used over the distal end of the clavicle. Finger dissection and palpation confirmed a smooth the inferior edge.  The distal clavicle was found to have a large impinging osteophyte which was removed as part of the specimen.  The wound was irrigated and closed with 2-0 Monocryl 3-0 running nylon suture   and Steri-Strips. The portals were then closed with 3-0 nylon suture.  The wounds were injected with 60 cc of Marcaine  with epinephrine  Sterile dressings were applied the patient was placed in a sling. He was then extubated and taken to recovery with stable condition 

## 2012-12-16 ENCOUNTER — Ambulatory Visit (INDEPENDENT_AMBULATORY_CARE_PROVIDER_SITE_OTHER): Payer: Self-pay | Admitting: Orthopedic Surgery

## 2012-12-16 ENCOUNTER — Encounter: Payer: Self-pay | Admitting: Orthopedic Surgery

## 2012-12-16 VITALS — BP 141/74 | Ht 69.0 in | Wt 233.0 lb

## 2012-12-16 DIAGNOSIS — S4381XA Sprain of other specified parts of right shoulder girdle, initial encounter: Secondary | ICD-10-CM

## 2012-12-16 DIAGNOSIS — Z9889 Other specified postprocedural states: Secondary | ICD-10-CM

## 2012-12-16 DIAGNOSIS — S46211A Strain of muscle, fascia and tendon of other parts of biceps, right arm, initial encounter: Secondary | ICD-10-CM

## 2012-12-16 DIAGNOSIS — S46819A Strain of other muscles, fascia and tendons at shoulder and upper arm level, unspecified arm, initial encounter: Secondary | ICD-10-CM

## 2012-12-16 DIAGNOSIS — M19019 Primary osteoarthritis, unspecified shoulder: Secondary | ICD-10-CM

## 2012-12-16 DIAGNOSIS — S43499A Other sprain of unspecified shoulder joint, initial encounter: Secondary | ICD-10-CM

## 2012-12-16 NOTE — Patient Instructions (Signed)
Start OT    

## 2012-12-16 NOTE — Progress Notes (Signed)
Patient ID: SHIELDS PAUTZ, male   DOB: [birth year], 78 y.o.   MRN: [id number] Chief Complaint  Patient presents with  . Follow-up    Post op #1, right shoulder. DOS 12-13-12.    BP 141/74  Ht 5\' 9"  (1.753 m)  Wt 233 lb (105.688 kg)  BMI 34.39 kg/m2  Encounter Diagnoses  Name Primary?  . Biceps tendon rupture, proximal, right, initial encounter   . Shoulder arthritis   . Partial tear of right subscapularis tendon   . S/P arthroscopy of shoulder Yes    Portal sutures were removed distal clavicle excision sutures left in place to be pulled next Tuesday  Start occupational therapy without restriction wear sling until next week

## 2012-12-23 ENCOUNTER — Ambulatory Visit: Payer: Medicare Other | Admitting: Orthopedic Surgery

## 2012-12-24 ENCOUNTER — Ambulatory Visit (INDEPENDENT_AMBULATORY_CARE_PROVIDER_SITE_OTHER): Payer: Self-pay | Admitting: Orthopedic Surgery

## 2012-12-24 ENCOUNTER — Encounter: Payer: Self-pay | Admitting: Orthopedic Surgery

## 2012-12-24 VITALS — BP 137/76 | Ht 69.0 in | Wt 233.0 lb

## 2012-12-24 DIAGNOSIS — Z9889 Other specified postprocedural states: Secondary | ICD-10-CM

## 2012-12-24 DIAGNOSIS — S43499A Other sprain of unspecified shoulder joint, initial encounter: Secondary | ICD-10-CM

## 2012-12-24 DIAGNOSIS — S4381XA Sprain of other specified parts of right shoulder girdle, initial encounter: Secondary | ICD-10-CM

## 2012-12-24 DIAGNOSIS — S46211A Strain of muscle, fascia and tendon of other parts of biceps, right arm, initial encounter: Secondary | ICD-10-CM

## 2012-12-24 DIAGNOSIS — M19019 Primary osteoarthritis, unspecified shoulder: Secondary | ICD-10-CM

## 2012-12-24 DIAGNOSIS — S46819A Strain of other muscles, fascia and tendons at shoulder and upper arm level, unspecified arm, initial encounter: Secondary | ICD-10-CM

## 2012-12-24 NOTE — Patient Instructions (Signed)
Call hospital to arrange PT  

## 2012-12-24 NOTE — Progress Notes (Signed)
Patient ID: Henry [name], male   DOB: 08-Jun-1934, 78 y.o.   MRN: [id number]  Chief Complaint  Patient presents with  . Follow-up    8 day follow up right shoulder   BP 137/76  Ht 5\' 9"  (1.753 m)  Wt 233 lb (105.688 kg)  BMI 34.39 kg/m2  Encounter Diagnoses  Name Primary?  . S/P shoulder surgery Yes  . S/P arthroscopy of shoulder   . Partial tear of right subscapularis tendon   . Biceps tendon rupture, proximal, right, initial encounter   . Shoulder arthritis     Status post arthroscopy of the shoulder debridement of the subscapularis tendon biceps tenotomy removal of distal clavicle  He is doing well not having any discomfort. His running 3-0 nylon suture was pulled out by accident. We see no evidence of a tear in his wound.  He can start physical therapy no need for sling  Followup in 6-7 weeks for recheck

## 2012-12-25 ENCOUNTER — Ambulatory Visit (HOSPITAL_COMMUNITY)
Admission: RE | Admit: 2012-12-25 | Discharge: 2012-12-25 | Disposition: A | Discharge status: Home / Self Care | Payer: Medicare Other | Source: Ambulatory Visit | Attending: Orthopedic Surgery | Admitting: Orthopedic Surgery

## 2012-12-25 DIAGNOSIS — M25519 Pain in unspecified shoulder: Secondary | ICD-10-CM | POA: Insufficient documentation

## 2012-12-25 DIAGNOSIS — IMO0001 Reserved for inherently not codable concepts without codable children: Secondary | ICD-10-CM | POA: Insufficient documentation

## 2012-12-25 DIAGNOSIS — M25619 Stiffness of unspecified shoulder, not elsewhere classified: Secondary | ICD-10-CM | POA: Insufficient documentation

## 2012-12-25 DIAGNOSIS — I10 Essential (primary) hypertension: Secondary | ICD-10-CM | POA: Insufficient documentation

## 2012-12-26 ENCOUNTER — Ambulatory Visit: Payer: Medicare Other | Admitting: Orthopedic Surgery

## 2012-12-26 NOTE — Evaluation (Signed)
Occupational Therapy Evaluation  Patient Details  Name: Henry [name] MRN: [id number] Date of Birth: 02-08-1935  Today's Date: 12/25/2012 Time: 1352-1430 OT Time Calculation (min): 38 min Evaluation 1352-1420 (28') TherExercises 1420-1430 (10')  Visit#: 1 of 10  Re-eval: 01/22/13  Assessment Diagnosis: Shoulder arthroscopy  and distal clavicle excision  Surgical Date: 12/13/12 Next MD Visit: 02/11/2013 Prior Therapy: ten years ago  Authorization: Medicare  Authorization Time Period: before 10th visit  Authorization Visit#: 1 of 10   Past Medical History:  Past Medical History  Diagnosis Date  . Hypertension   . GERD (gastroesophageal reflux disease)   . Hypercholesteremia   . HOH (hard of hearing)    Past Surgical History:  Past Surgical History  Procedure Laterality Date  . Shoulder surgery Right     torn cartiledge  . Esophagogastroduodenoscopy  10/28/2010    Procedure: ESOPHAGOGASTRODUODENOSCOPY (EGD);  Surgeon: Malissa Hippo, MD;  Location: [facility];  Service: Endoscopy;  Laterality: N/A;  10:30  . Colonoscopy    . Colonoscopy  05/17/2011    Procedure: COLONOSCOPY;  Surgeon: Malissa Hippo, MD;  Location: [facility];  Service: Endoscopy;  Laterality: N/A;  830  . Resection distal clavical Right 12/13/2012    Procedure: RESECTION DISTAL CLAVICAL;  Surgeon: Vickki Hearing, MD;  Location: [facility];  Service: Orthopedics;  Laterality: Right;  . Shoulder arthroscopy with bicepstenotomy Right 12/13/2012    Procedure: SHOULDER ARTHROSCOPY WITH BICEPS TENOTOMY, limited shoulder debridement;  Surgeon: Vickki Hearing, MD;  Location: [facility];  Service: Orthopedics;  Laterality: Right;    Subjective Symptoms/Limitations Symptoms: S: This is the same arm I had to to ten years ago.... i hope I recover just as well and fast so I can get back to playing golf.  Pertinent History: Patient states he had the same procedure approxamately 10 years ago to same shoulder  with good success. Patient with increased pain and difficullty with IADL and leisure tasks  Patient Stated Goals: to get back on the golf course  Pain Assessment Currently in Pain?: No/denies Pain Score: 5  Pain Location: Shoulder Pain Orientation: Right Pain Type: Acute pain;Surgical pain Multiple Pain Sites: No  Balance Screening Balance Screen Has the patient fallen in the past 6 months: No Has the patient had a decrease in activity level because of a fear of falling? : No Is the patient reluctant to leave their home because of a fear of falling? : No  Prior Functioning  Home Living Family/patient expects to be discharged to:: Private residence Living Arrangements: Spouse/significant other Prior Function Level of Independence: Independent with basic ADLs Driving: Yes Vocation: Retired Leisure: Hobbies-yes (Comment) Comments: golf, walking  Assessment ADL/Vision/Perception ADL ADL Comments: patient with no reported difficulties with self care tasks  Dominant Hand: Right Vision - History Baseline Vision: Wears glasses all the time  Cognition/Observation Cognition Overall Cognitive Status: Within Functional Limits for tasks assessed Arousal/Alertness: Awake/alert Orientation Level: Oriented X4  Sensation/Coordination/Edema Coordination Gross Motor Movements are Fluid and Coordinated: Yes Fine Motor Movements are Fluid and Coordinated: Yes  Additional Assessments RUE AROM (degrees) RUE Overall AROM Comments: measured in standing  Right Shoulder Extension: 54 Degrees Right Shoulder Flexion: 156 Degrees Right Shoulder ABduction: 140 Degrees Right Shoulder Internal Rotation: 82 Degrees Right Shoulder External Rotation: 53 Degrees RUE Strength Right Shoulder Flexion: 3+/5 Right Shoulder ABduction: 3+/5 Right Shoulder Internal Rotation: 3+/5 Right Shoulder External Rotation: 3/5 Palpation Palpation: min fascial restrictions in right upper arm, trapezius and  scapular  regions     Exercise/Treatments   Stretches Table Stretch - Flexion:  (10 reps) Table Stretch - Abduction:  (10 reps ) Table Stretch - External Rotation:  (10 reps )       Occupational Therapy Assessment and Plan OT Assessment and Plan Clinical Impression Statement: A: Patient is a 78 yo right handed male who presents s/p right shoulder arthroscopy and distal clavicle excision with increased complaint of pain with functional use, fascial restrictions, decreased AROM and strength causing decreased independence with IADLs and leisure activites. Pt will benefit from skilled therapeutic intervention in order to improve on the following deficits: Decreased strength;Decreased range of motion;Impaired UE functional use;Decreased activity tolerance;Pain;Increased fascial restricitons Rehab Potential: Excellent OT Frequency: Min 2X/week OT Duration: 4 weeks OT Treatment/Interventions: Self-care/ADL training;Therapeutic activities;Therapeutic exercise;Manual therapy;Modalities;Patient/family education OT Plan: P: Skilled OT interventions to maximize functional independence/potential, decreased pain and fascial restrictions, increase pain free ROM/mobility in Rt shoulder in order to return to leisure activities.  Treatment Plan: Progress as tolerated.  AROM, strengthening, HEP, education   Goals Home Exercise Program Pt/caregiver will Perform Home Exercise Program: For increased ROM;For increased strengthening;Independently PT Goal: Perform Home Exercise Program - Progress: Goal set today Short Term Goals Time to Complete Short Term Goals: 2 weeks Short Term Goal 1: Patient will be educated on HEP Short Term Goal 2: Patient will have 4-/5 strength in RUE for increased ability to participate in daily IADL tasks Short Term Goal 3: Patient will decrease pain in RUE to </=3/10 with functional use/daily activities Short Term Goal 4: Patient will have trace fascial restrictions in RUE  Long  Term Goals Time to Complete Long Term Goals: 4 weeks Long Term Goal 1: Patient will return to highest level of independence with all leisure and daily activites Long Term Goal 2: Patient will have 4+/5 strength in RUE for increased ability to return to leisure activity of playing golf Long Term Goal 3: Patient will decrease complaint of pain in RUE to </= 1/10 with leisure activities Long Term Goal 4: Patient will have zero fascial restictions in proximal RUE Long Term Goal 5: Patient will increase AROM to [facility] to improve golf swing, reach overhead.   Problem List Patient Active Problem List   Diagnosis Date Noted  . S/P shoulder surgery 12/24/2012  . S/P arthroscopy of shoulder 12/16/2012  . Shoulder arthritis 12/13/2012  . Biceps tendon rupture, proximal 12/13/2012  . Partial tear of right subscapularis tendon 12/13/2012  . Hypertension 05/10/2011  . Hyperlipidemia 05/10/2011  . Barrett esophagus 05/10/2011  . Esophagitis 05/10/2011  . FOOT SPRAIN, RIGHT 07/18/2007    End of Session Activity Tolerance: Patient tolerated treatment well General Behavior During Therapy: [facility] for tasks assessed/performed OT Plan of Care OT Home Exercise Plan: table slides OT Patient Instructions: handout given- scanned Consulted and Agree with Plan of Care: Patient  GO Functional Assessment Tool Used: FOTO 55/100  Shanece Cochrane,OTR/L  12/25/2012, 10:41 PM  Physician Documentation Your signature is required to indicate approval of the treatment plan as stated above.  Please sign and either send electronically or make a copy of this report for your files and return this physician signed original.  Please mark one 1.__approve of plan  2. ___approve of plan with the following conditions.   ______________________________  _____________________ Physician Signature                                                                                                              Date

## 2012-12-27 ENCOUNTER — Ambulatory Visit (HOSPITAL_COMMUNITY)
Admission: RE | Admit: 2012-12-27 | Discharge: 2012-12-27 | Disposition: A | Discharge status: Home / Self Care | Payer: Medicare Other | Source: Ambulatory Visit | Attending: Family Medicine | Admitting: Family Medicine

## 2012-12-27 NOTE — Progress Notes (Signed)
Occupational Therapy Treatment Patient Details  Name: Henry [name] MRN: [id number] Date of Birth: 29-Apr-1934  Today's Date: 12/27/2012 Time: 7829-5621 OT Time Calculation (min): 36 min Manual therapy 308-657 14' Therapeutic exercises 908-930 22' Visit#: 2 of 12  Re-eval: 01/22/13    Authorization: Medicare  Authorization Time Period: before 10th visit  Authorization Visit#: 2 of 10  Subjective Symptoms/Limitations Symptoms: S:  I think I am doing really well.   Limitations: No restriction except abduction and forward elevation progressive resistance ok to do IR and  ER PRE's   Pain Assessment Currently in Pain?: No/denies Pain Score: 0-No pain  Precautions/Restrictions    No restriction except abduction and forward elevation progressive resistance ok to do IR and  ER PRE's     Exercise/Treatments Supine Protraction: PROM;AAROM;10 reps Horizontal ABduction: PROM;AAROM;10 reps External Rotation: PROM;AAROM;10 reps Internal Rotation: PROM;AAROM;10 reps Flexion: PROM;AAROM;10 reps ABduction: PROM;AAROM;10 reps Standing External Rotation: Theraband;15 reps Theraband Level (Shoulder External Rotation): Level 2 (Red) Internal Rotation: Theraband;15 reps Theraband Level (Shoulder Internal Rotation): Level 2 (Red) Extension: Theraband;15 reps Theraband Level (Shoulder Extension): Level 2 (Red) Row: Theraband;15 reps Theraband Level (Shoulder Row): Level 2 (Red) Retraction: Theraband;15 reps Theraband Level (Shoulder Retraction): Level 2 (Red) Therapy Ball Flexion: 20 reps ABduction: 20 reps Right/Left: 5 reps ROM / Strengthening / Isometric Strengthening UBE (Upper Arm Bike): 3' and 3 at 1.0      Manual Therapy Manual Therapy: Myofascial release Myofascial Release: MFR and manual stretching to right anterior and posterior shoulder region, upper arm, and scapular region to decease pain and restrictions and increase pain free mobility.  Occupational Therapy  Assessment and Plan OT Assessment and Plan Clinical Impression Statement: A:  Patient has full PROM and AAROM in all ranges in supine, except external rotation.  OT Plan: P:  Add AROM in supine and AAROM or AROM in seated, issue tband as HEP.   Goals Home Exercise Program Pt/caregiver will Perform Home Exercise Program: For increased ROM;For increased strengthening;Independently Short Term Goals Time to Complete Short Term Goals: 2 weeks Short Term Goal 1: Patient will be educated on HEP Short Term Goal 2: Patient will have 4-/5 strength in RUE for increased ability to participate in daily IADL tasks Short Term Goal 3: Patient will decrease pain in RUE to </=3/10 with functional use/daily activities Short Term Goal 4: Patient will have trace fascial restrictions in RUE  Long Term Goals Time to Complete Long Term Goals: 4 weeks Long Term Goal 1: Patient will return to highest level of independence with all leisure and daily activites Long Term Goal 2: Patient will have 4+/5 strength in RUE for increased ability to return to leisure activity of playing golf Long Term Goal 3: Patient will decrease complaint of pain in RUE to </= 1/10 with leisure activities Long Term Goal 4: Patient will have zero fascial restictions in proximal RUE Long Term Goal 5: Patient will increase AROM to [facility] to improve golf swing, reach overhead.   Problem List Patient Active Problem List   Diagnosis Date Noted  . S/P shoulder surgery 12/24/2012  . S/P arthroscopy of shoulder 12/16/2012  . Shoulder arthritis 12/13/2012  . Biceps tendon rupture, proximal 12/13/2012  . Partial tear of right subscapularis tendon 12/13/2012  . Hypertension 05/10/2011  . Hyperlipidemia 05/10/2011  . Barrett esophagus 05/10/2011  . Esophagitis 05/10/2011  . FOOT SPRAIN, RIGHT 07/18/2007    End of Session Activity Tolerance: Patient tolerated treatment well General Behavior During Therapy: [facility] for tasks  assessed/performed  GO    Shirlean Mylar, OTR/L  12/27/2012, 9:34 AM

## 2012-12-30 ENCOUNTER — Ambulatory Visit (HOSPITAL_COMMUNITY)
Admission: RE | Admit: 2012-12-30 | Discharge: 2012-12-30 | Disposition: A | Discharge status: Home / Self Care | Payer: Medicare Other | Source: Ambulatory Visit | Attending: Occupational Therapy | Admitting: Occupational Therapy

## 2012-12-30 NOTE — Progress Notes (Signed)
Occupational Therapy Treatment Patient Details  Name: Henry [name] MRN: [id number] Date of Birth: [birth year]  Today's Date: 12/30/2012 Time: 4098-1191 OT Time Calculation (min): 42 min Manual 4782-9562 (15') TherExercises 1308-6578 (27')  Visit#: 3 of 12  Re-eval: 01/22/13    Authorization: Medicare  Authorization Time Period: before 10th visit  Authorization Visit#: 3 of    Subjective Symptoms/Limitations Symptoms: S:  I think I must have rolled over on that arm last night it hurts a little  Limitations: No restriction except abduction and forward elevation progressive resistance ok to do IR and  ER PRE's   Pain Assessment Currently in Pain?: Yes Pain Score: 4  Pain Location: Shoulder Pain Orientation: Right Pain Type: Acute pain;Surgical pain Multiple Pain Sites: No  Exercise/Treatments Supine Protraction: AAROM;12 reps;AROM;10 reps Horizontal ABduction: AAROM;12 reps;AROM;10 reps External Rotation: PROM;AAROM;12 reps;AROM;10 reps Internal Rotation: AAROM;12 reps;AROM;10 reps Flexion: AAROM;12 reps;AROM;10 reps ABduction: AAROM;12 reps;AROM;10 reps Seated Elevation: AROM;10 reps Extension: AROM;10 reps Retraction: AROM;10 reps Row: AROM;10 reps External Rotation: AAROM;10 reps Internal Rotation: AROM;10 reps Flexion: AROM;10 reps Abduction: AROM;10 reps   Standing External Rotation: Theraband;15 reps Theraband Level (Shoulder External Rotation): Level 2 (Red) Internal Rotation: Theraband;15 reps Theraband Level (Shoulder Internal Rotation): Level 2 (Red) Extension: Theraband;15 reps Theraband Level (Shoulder Extension): Level 2 (Red) Row: Theraband;15 reps Theraband Level (Shoulder Row): Level 2 (Red) Retraction: Theraband;15 reps Theraband Level (Shoulder Retraction): Level 2 (Red) Therapy Ball Right/Left: 10 reps ROM / Strengthening / Isometric Strengthening UBE (Upper Arm Bike): 3' and 3 at 1.0        Manual Therapy Manual Therapy:  Myofascial release Myofascial Release: MFR and manual stretching to right anterior and posterior shoulder region, upper arm, and scapular region to decease pain and restrictions and increase pain free mobility.  Occupational Therapy Assessment and Plan OT Assessment and Plan Clinical Impression Statement: A: Patient demos improved external rotation this date.  Soreness from beginning of treatment gone by end of session this date.  Good tolerance for seated AROM  OT Plan: P: issue theraband HEP   Goals Short Term Goals Short Term Goal 1: Patient will be educated on HEP Short Term Goal 1 Progress: Progressing toward goal Short Term Goal 2 Progress: Progressing toward goal Short Term Goal 3: Patient will decrease pain in RUE to </=3/10 with functional use/daily activities Short Term Goal 3 Progress: Progressing toward goal Short Term Goal 4: Patient will have trace fascial restrictions in RUE  Short Term Goal 4 Progress: Progressing toward goal Long Term Goals Long Term Goal 1: Patient will return to highest level of independence with all leisure and daily activites Long Term Goal 1 Progress: Progressing toward goal Long Term Goal 2: Patient will have 4+/5 strength in RUE for increased ability to return to leisure activity of playing golf Long Term Goal 2 Progress: Progressing toward goal Long Term Goal 3: Patient will decrease complaint of pain in RUE to </= 1/10 with leisure activities Long Term Goal 3 Progress: Progressing toward goal Long Term Goal 4: Patient will have zero fascial restictions in proximal RUE Long Term Goal 4 Progress: Progressing toward goal Long Term Goal 5: Patient will increase AROM to [facility] to improve golf swing, reach overhead.  Long Term Goal 5 Progress: Progressing toward goal  Problem List Patient Active Problem List   Diagnosis Date Noted  . S/P shoulder surgery 12/24/2012  . S/P arthroscopy of shoulder 12/16/2012  . Shoulder arthritis 12/13/2012  . Biceps  tendon rupture, proximal 12/13/2012  .  Partial tear of right subscapularis tendon 12/13/2012  . Hypertension 05/10/2011  . Hyperlipidemia 05/10/2011  . Barrett esophagus 05/10/2011  . Esophagitis 05/10/2011  . FOOT SPRAIN, RIGHT 07/18/2007    End of Session Activity Tolerance: Patient tolerated treatment well General Behavior During Therapy: [facility] for tasks assessed/performed  GO    Velora Mediate, OTR/L  12/30/2012, 9:32 AM

## 2013-01-01 ENCOUNTER — Ambulatory Visit (HOSPITAL_COMMUNITY)
Admission: RE | Admit: 2013-01-01 | Discharge: 2013-01-01 | Disposition: A | Discharge status: Home / Self Care | Payer: Medicare Other | Source: Ambulatory Visit | Attending: Family Medicine | Admitting: Family Medicine

## 2013-01-01 NOTE — Progress Notes (Signed)
Occupational Therapy Treatment Patient Details  Name: Henry [name] MRN: [id number] Date of Birth: 09-Dec-1934  Today's Date: 01/01/2013 Time: 0865-7846 OT Time Calculation (min): 40 min Manual therapy [phone] 11' Therapeutic exercises [phone] 29' Visit#: 4 of 12  Re-eval: 01/22/13    Authorization: Medicare  Authorization Time Period: before 10th visit  Authorization Visit#: 4 of 10  Subjective S:  I am doing well. Limitations: No restriction except abduction and forward elevation progressive resistance ok to do IR and  ER PRE's   Pain Assessment Currently in Pain?: Yes Pain Score: 2  Pain Location: Shoulder Pain Orientation: Right Pain Type: Acute pain  Precautions/Restrictions   no strengthening of flexion and abduction   Exercise/Treatments Supine Protraction: PROM;10 reps;AROM;15 reps Horizontal ABduction: PROM;10 reps;AROM;15 reps External Rotation: PROM;10 reps;AROM;15 reps Internal Rotation: PROM;10 reps;AROM;15 reps Flexion: PROM;10 reps;AROM;15 reps ABduction: PROM;10 reps;AROM;15 reps Seated Elevation: AROM;10 reps Protraction: AROM;10 reps Horizontal ABduction: AROM;10 reps External Rotation: AROM;10 reps Internal Rotation: AROM;10 reps Flexion: AROM;10 reps Abduction: AROM;10 reps Standing External Rotation: Theraband;15 reps Theraband Level (Shoulder External Rotation): Level 2 (Red) Internal Rotation: Theraband;15 reps Theraband Level (Shoulder Internal Rotation): Level 2 (Red) Extension: Theraband;15 reps Theraband Level (Shoulder Extension): Level 2 (Red) Row: Theraband;15 reps Theraband Level (Shoulder Row): Level 2 (Red) Retraction: Theraband;15 reps Theraband Level (Shoulder Retraction): Level 2 (Red) Therapy Ball Right/Left: 10 reps ROM / Strengthening / Isometric Strengthening UBE (Upper Arm Bike): 3' and 3' at 2.0 Proximal Shoulder Strengthening, Supine: 10 times  Proximal Shoulder Strengthening, Seated: 10 times Ball on Wall: 1  minute with shoulder flexed, attempted 1 min with shoudler abducted and was able to complete 30" only.      Manual Therapy Manual Therapy: Myofascial release Myofascial Release: MFR and manual stretching to right anterior and posterior shoulder region, upper arm, and scapular region to decease pain and restrictions and increase pain free mobility.  Occupational Therapy Assessment and Plan OT Assessment and Plan Clinical Impression Statement: A:  Good form with all exercises, abduction ball on wall exercise was painful this date.  Issued red tband for scapular stability for home use this date.  OT Plan: P:  Begin strengthening in supine, except for flexion and abduction.     Goals Short Term Goals Short Term Goal 1: Patient will be educated on HEP Short Term Goal 2: Patient will have 4-/5 strength in RUE for increased ability to participate in daily IADL tasks Short Term Goal 3: Patient will decrease pain in RUE to </=3/10 with functional use/daily activities Short Term Goal 4: Patient will have trace fascial restrictions in RUE  Long Term Goals Long Term Goal 1: Patient will return to highest level of independence with all leisure and daily activites Long Term Goal 2: Patient will have 4+/5 strength in RUE for increased ability to return to leisure activity of playing golf Long Term Goal 3: Patient will decrease complaint of pain in RUE to </= 1/10 with leisure activities Long Term Goal 4: Patient will have zero fascial restictions in proximal RUE Long Term Goal 5: Patient will increase AROM to [facility] to improve golf swing, reach overhead.   Problem List Patient Active Problem List   Diagnosis Date Noted  . S/P shoulder surgery 12/24/2012  . S/P arthroscopy of shoulder 12/16/2012  . Shoulder arthritis 12/13/2012  . Biceps tendon rupture, proximal 12/13/2012  . Partial tear of right subscapularis tendon 12/13/2012  . Hypertension 05/10/2011  . Hyperlipidemia 05/10/2011  . Barrett  esophagus 05/10/2011  . Esophagitis  05/10/2011  . FOOT SPRAIN, RIGHT 07/18/2007    End of Session Activity Tolerance: Patient tolerated treatment well General Behavior During Therapy: WFL for tasks assessed/performed OT Plan of Care OT Home Exercise Plan: scapular strengthening with red tband.  OT Patient Instructions: hand out given  GO    Shirlean Mylar, OTR/L  01/01/2013, 9:32 AM

## 2013-01-03 ENCOUNTER — Ambulatory Visit (HOSPITAL_COMMUNITY)
Admission: RE | Admit: 2013-01-03 | Discharge: 2013-01-03 | Disposition: A | Discharge status: Home / Self Care | Payer: Medicare Other | Source: Ambulatory Visit | Attending: Family Medicine | Admitting: Family Medicine

## 2013-01-03 NOTE — Progress Notes (Signed)
Occupational Therapy Treatment Patient Details  Name: Henry [name] MRN: [id number] Date of Birth: [birth year]  Today's Date: 01/03/2013 Time: 4098-1191 OT Time Calculation (min): 46 min Manual 4782-9562(13') TherExercises 0865-7846 (33')  Visit#: 5 of 12  Re-eval: 01/22/13    Authorization: Medicare  Authorization Time Period: before 10th visit  Authorization Visit#: 5 of 10  Subjective Symptoms/Limitations Symptoms: S:  Those stretchy bands do pretty good... I can feel them working.   Pain Assessment Currently in Pain?: No/denies   Exercise/Treatments Supine Protraction: PROM;10 reps;AROM;15 reps Horizontal ABduction: PROM;10 reps;AROM;15 reps External Rotation: PROM;10 reps;AROM;15 reps Internal Rotation: PROM;10 reps;AROM;15 reps Flexion: PROM;10 reps;AROM;15 reps ABduction: PROM;10 reps;AROM;15 reps Seated Elevation: AROM;10 reps Protraction: AROM;10 reps Horizontal ABduction: AROM;10 reps External Rotation: AROM;10 reps Internal Rotation: AROM;10 reps Flexion: AROM;10 reps Abduction: AROM;10 reps    Standing External Rotation: Theraband;15 reps Theraband Level (Shoulder External Rotation): Level 2 (Red) Internal Rotation: Theraband;15 reps Theraband Level (Shoulder Internal Rotation): Level 2 (Red) Extension: Theraband;15 reps Theraband Level (Shoulder Extension): Level 2 (Red) Row: Theraband;15 reps Theraband Level (Shoulder Row): Level 2 (Red) Retraction: Theraband;15 reps Theraband Level (Shoulder Retraction): Level 2 (Red) Pulleys   Therapy Ball Right/Left: 10 reps ROM / Strengthening / Isometric Strengthening UBE (Upper Arm Bike): 3' and 3' at 2.5 Proximal Shoulder Strengthening, Supine: 10 times  Proximal Shoulder Strengthening, Seated: 10 times Ball on Wall: 1 minute with shoulder flexed, attempted 1 min with shoudler abducted and was able to complete 40" only.            Manual Therapy Myofascial Release: MFR and manual  stretching to right anterior and posterior shoulder region, upper arm, and scapular region to decease pain and restrictions and increase pain free mobility. Weight Bearing Technique Weight Bearing Technique: No  Occupational Therapy Assessment and Plan OT Assessment and Plan Clinical Impression Statement: A:  Good form with theraband exercises, reports completiing at home.  Questions on IR/ER exercise this date.  Continue with difficulty with abducted ball on wall. Excellent progres/motivation  OT Plan: P: Decreased to 2x/week.  Strengthening in supine except flexion and abduction    Goals Short Term Goals Short Term Goal 1: Patient will be educated on HEP Short Term Goal 1 Progress: Progressing toward goal Short Term Goal 2: Patient will have 4-/5 strength in RUE for increased ability to participate in daily IADL tasks Short Term Goal 2 Progress: Progressing toward goal Short Term Goal 3: Patient will decrease pain in RUE to </=3/10 with functional use/daily activities Short Term Goal 3 Progress: Progressing toward goal Short Term Goal 4: Patient will have trace fascial restrictions in RUE  Short Term Goal 4 Progress: Progressing toward goal Long Term Goals Long Term Goal 1: Patient will return to highest level of independence with all leisure and daily activites Long Term Goal 1 Progress: Progressing toward goal Long Term Goal 2: Patient will have 4+/5 strength in RUE for increased ability to return to leisure activity of playing golf Long Term Goal 2 Progress: Progressing toward goal Long Term Goal 3: Patient will decrease complaint of pain in RUE to </= 1/10 with leisure activities Long Term Goal 3 Progress: Progressing toward goal Long Term Goal 4: Patient will have zero fascial restictions in proximal RUE Long Term Goal 4 Progress: Progressing toward goal Long Term Goal 5: Patient will increase AROM to [facility] to improve golf swing, reach overhead.  Long Term Goal 5 Progress:  Progressing toward goal  Problem List Patient Active Problem List  Diagnosis Date Noted  . S/P shoulder surgery 12/24/2012  . S/P arthroscopy of shoulder 12/16/2012  . Shoulder arthritis 12/13/2012  . Biceps tendon rupture, proximal 12/13/2012  . Partial tear of right subscapularis tendon 12/13/2012  . Hypertension 05/10/2011  . Hyperlipidemia 05/10/2011  . Barrett esophagus 05/10/2011  . Esophagitis 05/10/2011  . FOOT SPRAIN, RIGHT 07/18/2007    End of Session Activity Tolerance: Patient tolerated treatment well General Behavior During Therapy: [facility] for tasks assessed/performed  GO    Velora Mediate, OTr/L  01/03/2013, 9:35 AM

## 2013-01-06 ENCOUNTER — Ambulatory Visit (HOSPITAL_COMMUNITY)
Admission: RE | Admit: 2013-01-06 | Discharge: 2013-01-06 | Disposition: A | Discharge status: Home / Self Care | Payer: Medicare Other | Source: Ambulatory Visit | Attending: Family Medicine | Admitting: Family Medicine

## 2013-01-06 NOTE — Progress Notes (Signed)
Occupational Therapy Treatment Patient Details  Name: Henry [name] MRN: [id number] Date of Birth: August 07, 1934  Today's Date: 01/06/2013 Time: 7846-9629 OT Time Calculation (min): 43 min Manual 5284-1324 (10') TherExercise 4010-2725 (33')  Visit#: 6 of 12  Re-eval: 01/22/13    Authorization: Medicare  Authorization Time Period: before 10th visit  Authorization Visit#: 6 of 10  Subjective Symptoms/Limitations Symptoms: S:  I dont really have any pain.... I think I am doing really good.  Pain Assessment Currently in Pain?: No/denies   Exercise/Treatments Supine Protraction: PROM;10 reps;AROM;Strengthening;15 reps;Weights Protraction Weight (lbs): 2 Horizontal ABduction: PROM;10 reps;AROM;15 reps External Rotation: PROM;10 reps;AROM;Strengthening;15 reps;Weights External Rotation Weight (lbs): 2 Internal Rotation: PROM;10 reps;AROM;15 reps;Strengthening;Weights Internal Rotation Weight (lbs): 2 Flexion: PROM;10 reps;AROM;15 reps ABduction: PROM;10 reps;AROM;15 reps Seated Elevation: AROM;10 reps;Weights Elevation Weight (lbs): 2 Extension: AROM;10 reps Retraction: 10 reps;Weights Retraction Weight (lbs): 2 Row: AROM;10 reps;Weights Row Weight (lbs): 2 Protraction: AROM;10 reps;Weights Protraction Weight (lbs): 2 Horizontal ABduction: AROM;10 reps External Rotation: AROM;10 reps;Weights External Rotation Weight (lbs): 2 Internal Rotation: AROM;10 reps;Weights Internal Rotation Weight (lbs): 2 Flexion: AROM;10 reps Abduction: AROM;10 reps Standing External Rotation: Theraband;15 reps Theraband Level (Shoulder External Rotation): Level 3 (Green) Internal Rotation: Theraband;15 reps Theraband Level (Shoulder Internal Rotation): Level 3 (Green) Extension: Theraband;15 reps Theraband Level (Shoulder Extension): Level 3 (Green) Row: Theraband;15 reps Theraband Level (Shoulder Row): Level 3 (Green) Retraction: Theraband;15 reps Theraband Level (Shoulder  Retraction): Level 3 (Green) Therapy Ball Right/Left: 10 reps ROM / Strengthening / Isometric Strengthening UBE (Upper Arm Bike): 3' and 3' at 3.0 Proximal Shoulder Strengthening, Supine: 30" Proximal Shoulder Strengthening, Seated: 30" Ball on Wall: 1 minute with shoulder flexed, attempted 1 min with shoudler abducted and was able to complete 40" only.        Manual Therapy Manual Therapy: Myofascial release Myofascial Release: MFR and manual stretching to right anterior and posterior shoulder region, upper arm, and scapular region to decease pain and restrictions and increase pain free mobility. Weight Bearing Technique Weight Bearing Technique: No  Occupational Therapy Assessment and Plan OT Assessment and Plan Clinical Impression Statement: A:  Patient with increased strength, activity endurance/tolerance for daily tasks and exercises.  Introduced 2# weight this date for exercises except flexion and abduction with excellent tolerance.  Patient reports good follow through with home theraband HEP wtih no difficulties.  OT Plan: P: Discharge.  Continue strengthening in supine except flexion and abduction.    Goals Short Term Goals Short Term Goal 1: Patient will be educated on HEP Short Term Goal 1 Progress: Progressing toward goal Short Term Goal 2: Patient will have 4-/5 strength in RUE for increased ability to participate in daily IADL tasks Short Term Goal 2 Progress: Progressing toward goal Short Term Goal 3: Patient will decrease pain in RUE to </=3/10 with functional use/daily activities Short Term Goal 3 Progress: Progressing toward goal Short Term Goal 4: Patient will have trace fascial restrictions in RUE  Short Term Goal 4 Progress: Progressing toward goal Long Term Goals Long Term Goal 1: Patient will return to highest level of independence with all leisure and daily activites Long Term Goal 1 Progress: Progressing toward goal Long Term Goal 2: Patient will have 4+/5  strength in RUE for increased ability to return to leisure activity of playing golf Long Term Goal 2 Progress: Progressing toward goal Long Term Goal 3: Patient will decrease complaint of pain in RUE to </= 1/10 with leisure activities Long Term Goal 3 Progress: Progressing toward goal Long Term  Goal 4: Patient will have zero fascial restictions in proximal RUE Long Term Goal 4 Progress: Progressing toward goal Long Term Goal 5: Patient will increase AROM to [facility] to improve golf swing, reach overhead.  Long Term Goal 5 Progress: Progressing toward goal  Problem List Patient Active Problem List   Diagnosis Date Noted  . S/P shoulder surgery 12/24/2012  . S/P arthroscopy of shoulder 12/16/2012  . Shoulder arthritis 12/13/2012  . Biceps tendon rupture, proximal 12/13/2012  . Partial tear of right subscapularis tendon 12/13/2012  . Hypertension 05/10/2011  . Hyperlipidemia 05/10/2011  . Barrett esophagus 05/10/2011  . Esophagitis 05/10/2011  . FOOT SPRAIN, RIGHT 07/18/2007    End of Session Activity Tolerance: Patient tolerated treatment well General Behavior During Therapy: [facility] for tasks assessed/performed  GO    Velora Mediate, OTR/L 01/06/2013, 9:31 AM

## 2013-01-07 ENCOUNTER — Ambulatory Visit (HOSPITAL_COMMUNITY): Payer: Medicare Other | Admitting: Occupational Therapy

## 2013-01-08 ENCOUNTER — Ambulatory Visit (HOSPITAL_COMMUNITY)
Admission: RE | Admit: 2013-01-08 | Discharge: 2013-01-08 | Disposition: A | Discharge status: Home / Self Care | Payer: Medicare Other | Source: Ambulatory Visit | Attending: Family Medicine | Admitting: Family Medicine

## 2013-01-08 NOTE — Evaluation (Addendum)
Occupational Therapy Re-Evaluation/Discharge   Patient Details  Name: Henry [name] MRN: [id number] Date of Birth: 1934/08/22  Today's Date: 01/08/2013 Time: 1308-6578 OT Time Calculation (min): 35 min Reassessment 4696-2952(84') TherExercises 1324-4010 (22')  Visit#: 7 of 12  Re-eval: 01/22/13  Assessment Diagnosis: Shoulder arthroscopy  and distal clavicle excision  Surgical Date: 12/13/12 Next MD Visit: 02/11/2013  Authorization: Medicare  Authorization Time Period: before 10th visit  Authorization Visit#: 7 of 10   Past Medical History:  Past Medical History  Diagnosis Date  . Hypertension   . GERD (gastroesophageal reflux disease)   . Hypercholesteremia   . HOH (hard of hearing)    Past Surgical History:  Past Surgical History  Procedure Laterality Date  . Shoulder surgery Right     torn cartiledge  . Esophagogastroduodenoscopy  10/28/2010    Procedure: ESOPHAGOGASTRODUODENOSCOPY (EGD);  Surgeon: Malissa Hippo, MD;  Location: [facility];  Service: Endoscopy;  Laterality: N/A;  10:30  . Colonoscopy    . Colonoscopy  05/17/2011    Procedure: COLONOSCOPY;  Surgeon: Malissa Hippo, MD;  Location: [facility];  Service: Endoscopy;  Laterality: N/A;  830  . Resection distal clavical Right 12/13/2012    Procedure: RESECTION DISTAL CLAVICAL;  Surgeon: Vickki Hearing, MD;  Location: [facility];  Service: Orthopedics;  Laterality: Right;  . Shoulder arthroscopy with bicepstenotomy Right 12/13/2012    Procedure: SHOULDER ARTHROSCOPY WITH BICEPS TENOTOMY, limited shoulder debridement;  Surgeon: Vickki Hearing, MD;  Location: [facility];  Service: Orthopedics;  Laterality: Right;    Subjective Symptoms/Limitations Symptoms: S:  No pain, just stiff when I move it sometimes.  Pain Assessment Currently in Pain?: No/denies   Balance Screening Balance Screen Has the patient fallen in the past 6 months: No   Assessment ADL/Vision/Perception ADL ADL Comments:  Independent with all ADL tasks and reports increasing IADL tasks within parameters Dominant Hand: Right  Cognition/Observation Cognition Overall Cognitive Status: Within Functional Limits for tasks assessed  Sensation/Coordination/Edema Coordination Gross Motor Movements are Fluid and Coordinated: Yes Fine Motor Movements are Fluid and Coordinated: Yes  Additional Assessments RUE AROM (degrees) Right Shoulder Flexion: 168 Degrees Right Shoulder ABduction: 171 Degrees Right Shoulder Internal Rotation: 86 Degrees Right Shoulder External Rotation: 65 Degrees RUE Strength Right Shoulder Flexion: 4/5 Right Shoulder ABduction: 4/5 Right Shoulder Internal Rotation: 5/5 Right Shoulder External Rotation: 4/5 Palpation Palpation: Zero fascial restrictions.     Exercise/Treatments Supine Protraction: PROM;10 reps;AROM;Strengthening;15 reps;Weights Protraction Weight (lbs): 2 Horizontal ABduction: PROM;10 reps;AROM;15 reps External Rotation: PROM;10 reps;AROM;Strengthening;15 reps;Weights External Rotation Weight (lbs): 2 Internal Rotation: PROM;10 reps;AROM;15 reps;Strengthening;Weights Internal Rotation Weight (lbs): 2 Flexion: PROM;10 reps;AROM;15 reps ABduction: PROM;10 reps;AROM;15 reps Seated External Rotation: AROM;10 reps;Weights External Rotation Weight (lbs): 2 Internal Rotation: AROM;10 reps;Weights Internal Rotation Weight (lbs): 2 Flexion: AROM;10 reps Abduction: AROM;10 reps    Standing External Rotation: Theraband;15 reps Theraband Level (Shoulder External Rotation): Level 3 (Green) Internal Rotation: Theraband;15 reps Theraband Level (Shoulder Internal Rotation): Level 3 (Green) Extension: Theraband;15 reps Theraband Level (Shoulder Extension): Level 3 (Green) Row: Theraband;15 reps Theraband Level (Shoulder Row): Level 3 (Green) Retraction: Theraband;15 reps Theraband Level (Shoulder Retraction): Level 3 (Green)     Manual Therapy Manual Therapy:  Myofascial release Myofascial Release: MFR and manual stretching to right anterior and posterior shoulder region, upper arm, and scapular region to decease pain and restrictions and increase pain free mobility  Occupational Therapy Assessment and Plan OT Assessment and Plan Clinical Impression Statement: A: Reassessment/discharge this date. Patient met  4/4 STGs, 3/5 LTGs partially meeting one additional goal within precautions set by MD for flexion and abduction restrictioins.  Patient demos excellent range and good strength throughout RUE only complaint of tightness in R shoulder for ER exercieses.  Patient demos, verbalizes good understanding of theraband HEP .   OT Plan: P:  Discharge from skiilled OT services at this time with continue theraband HEP    Goals Short Term Goals Short Term Goal 1: Patient will be educated on HEP Short Term Goal 1 Progress: Met Short Term Goal 2: Patient will have 4-/5 strength in RUE for increased ability to participate in daily IADL tasks Short Term Goal 2 Progress: Met Short Term Goal 3: Patient will decrease pain in RUE to </=3/10 with functional use/daily activities Short Term Goal 3 Progress: Met Short Term Goal 4: Patient will have trace fascial restrictions in RUE  Short Term Goal 4 Progress: Met Long Term Goals Long Term Goal 1: Patient will return to highest level of independence with all leisure and daily activites (within restrictions/precautions set by MD. ) Long Term Goal 1 Progress: Partly met Long Term Goal 2: Patient will have 4+/5 strength in RUE for increased ability to return to leisure activity of playing golf Long Term Goal 2 Progress: Not met Long Term Goal 3: Patient will decrease complaint of pain in RUE to </= 1/10 with leisure activities Long Term Goal 3 Progress: Met Long Term Goal 4: Patient will have zero fascial restictions in proximal RUE Long Term Goal 4 Progress: Met Long Term Goal 5: Patient will increase AROM to [facility] to  improve golf swing, reach overhead.  Long Term Goal 5 Progress: Met  Problem List Patient Active Problem List   Diagnosis Date Noted  . S/P shoulder surgery 12/24/2012  . S/P arthroscopy of shoulder 12/16/2012  . Shoulder arthritis 12/13/2012  . Biceps tendon rupture, proximal 12/13/2012  . Partial tear of right subscapularis tendon 12/13/2012  . Hypertension 05/10/2011  . Hyperlipidemia 05/10/2011  . Barrett esophagus 05/10/2011  . Esophagitis 05/10/2011  . FOOT SPRAIN, RIGHT 07/18/2007    End of Session Activity Tolerance: Patient tolerated treatment well General Behavior During Therapy: [facility] for tasks assessed/performed  GO Functional Assessment Tool Used: clinical observation  Functional Limitation: Carrying, moving and handling objects Carrying, Moving and Handling Objects Goal Status ([id number]): At least 1 percent but less than 20 percent impaired, limited or restricted Carrying, Moving and Handling Objects Discharge Status [phone]): At least 1 percent but less than 20 percent impaired, limited or restricted  Velora Mediate, OTR/L  01/08/2013, 9:53 AM  Physician Documentation Your signature is required to indicate approval of the treatment plan as stated above.  Please sign and either send electronically or make a copy of this report for your files and return this physician signed original.  Please mark one 1.__approve of plan  2. ___approve of plan with the following conditions.   ______________________________                                                          _____________________ Physician Signature  Date  

## 2013-02-11 ENCOUNTER — Encounter: Payer: Self-pay | Admitting: Orthopedic Surgery

## 2013-02-11 ENCOUNTER — Ambulatory Visit (INDEPENDENT_AMBULATORY_CARE_PROVIDER_SITE_OTHER): Payer: Self-pay | Admitting: Orthopedic Surgery

## 2013-02-11 VITALS — BP 139/71 | Ht 69.0 in | Wt 233.0 lb

## 2013-02-11 DIAGNOSIS — S4381XA Sprain of other specified parts of right shoulder girdle, initial encounter: Secondary | ICD-10-CM

## 2013-02-11 DIAGNOSIS — Z9889 Other specified postprocedural states: Secondary | ICD-10-CM

## 2013-02-11 DIAGNOSIS — S46811A Strain of other muscles, fascia and tendons at shoulder and upper arm level, right arm, initial encounter: Secondary | ICD-10-CM

## 2013-02-11 DIAGNOSIS — Z5189 Encounter for other specified aftercare: Secondary | ICD-10-CM

## 2013-02-11 DIAGNOSIS — S46819A Strain of other muscles, fascia and tendons at shoulder and upper arm level, unspecified arm, initial encounter: Secondary | ICD-10-CM

## 2013-02-11 DIAGNOSIS — M19019 Primary osteoarthritis, unspecified shoulder: Secondary | ICD-10-CM

## 2013-02-11 DIAGNOSIS — S46211D Strain of muscle, fascia and tendon of other parts of biceps, right arm, subsequent encounter: Secondary | ICD-10-CM

## 2013-02-11 NOTE — Progress Notes (Signed)
Patient ID: Henry [name], male   DOB: [birth year], 78 y.o.   MRN: [id number] Chief Complaint  Patient presents with  . Follow-up    6 week recheck on right shoulder after therapy. DOS 12-13-12.  PRE-OPERATIVE DIAGNOSIS:  arthritis right shoulder  POST-OPERATIVE DIAGNOSIS:   right shoulder, biceps tendonitis/partial tear, rotator cuff tendonitis, partial subscapularis tear, arthritis of distal clavicle  Operative findings. #1 intrasubstance tear Biceps tendon greater than 50% #2 leading edge subscapularis tear #3 rotator cuff tendinopathy and tendinitis #4 arthritis of the distal clavicle and a.c. Joint   PROCEDURE:  Procedure(s): RESECTION DISTAL CLAVICAL (Right) SHOULDER ARTHROSCOPY WITH BICEPS TENOTOMY, limited shoulder debridement (Right)  #1 procedure was shoulder arthroscopy with biceps tenotomy and limited debridement. The debridement involved the leading edge of the subscapularis and the subacromial bursa  #2 open distal clavicle excision   Postop status post rotator cuff surgery right shoulder going well has been dismissed from formal therapy is working on Scientist, product/process development to home  His for elevation is painful and 120 his abduction is painful at 90 his external rotation is 50  Continue home exercises return in a month

## 2013-02-11 NOTE — Patient Instructions (Signed)
30 days of home therapy with thearbands

## 2013-02-11 NOTE — Addendum Note (Signed)
Encounter addended by: Velora Mediate, OT on: 02/11/2013  7:02 PM<BR>     Documentation filed: Clinical Notes

## 2013-02-21 DIAGNOSIS — Z23 Encounter for immunization: Secondary | ICD-10-CM | POA: Diagnosis not present

## 2013-03-13 ENCOUNTER — Ambulatory Visit: Payer: Medicare Other | Admitting: Orthopedic Surgery

## 2013-03-18 ENCOUNTER — Ambulatory Visit: Payer: Medicare Other | Admitting: Orthopedic Surgery

## 2013-03-20 ENCOUNTER — Encounter: Payer: Self-pay | Admitting: Orthopedic Surgery

## 2013-03-20 ENCOUNTER — Ambulatory Visit (INDEPENDENT_AMBULATORY_CARE_PROVIDER_SITE_OTHER): Payer: Medicare Other | Admitting: Orthopedic Surgery

## 2013-03-20 VITALS — BP 125/68 | Ht 69.0 in | Wt 233.0 lb

## 2013-03-20 DIAGNOSIS — M19011 Primary osteoarthritis, right shoulder: Secondary | ICD-10-CM

## 2013-03-20 DIAGNOSIS — S46819A Strain of other muscles, fascia and tendons at shoulder and upper arm level, unspecified arm, initial encounter: Secondary | ICD-10-CM | POA: Diagnosis not present

## 2013-03-20 DIAGNOSIS — Z9889 Other specified postprocedural states: Secondary | ICD-10-CM | POA: Diagnosis not present

## 2013-03-20 DIAGNOSIS — M19019 Primary osteoarthritis, unspecified shoulder: Secondary | ICD-10-CM

## 2013-03-20 DIAGNOSIS — S46811A Strain of other muscles, fascia and tendons at shoulder and upper arm level, right arm, initial encounter: Secondary | ICD-10-CM

## 2013-03-20 DIAGNOSIS — S4381XA Sprain of other specified parts of right shoulder girdle, initial encounter: Secondary | ICD-10-CM

## 2013-03-20 NOTE — Patient Instructions (Signed)
Exercises at home   Activity as tolerated

## 2013-03-20 NOTE — Progress Notes (Signed)
Patient ID: Henry [name], male   DOB: 17-Oct-1934, 79 y.o.   MRN: [id number]  Chief Complaint  Patient presents with  . Follow-up    one month recheck right shoulder s/p home exercises    Status post arthroscopy right shoulder  Patient is doing well  Has full forward elevation. He has 5 out of 5 strength in the supraspinatus tendon  He can resume all normal activities without restriction followup as needed

## 2013-03-26 ENCOUNTER — Other Ambulatory Visit: Payer: Self-pay | Admitting: Family Medicine

## 2013-03-26 DIAGNOSIS — M545 Low back pain, unspecified: Secondary | ICD-10-CM

## 2013-03-27 ENCOUNTER — Other Ambulatory Visit: Payer: Medicare Other

## 2013-04-01 ENCOUNTER — Other Ambulatory Visit: Payer: Medicare Other

## 2013-04-04 ENCOUNTER — Ambulatory Visit
Admission: RE | Admit: 2013-04-04 | Discharge: 2013-04-04 | Disposition: A | Discharge status: Home / Self Care | Payer: Medicare Other | Source: Ambulatory Visit | Attending: Family Medicine | Admitting: Family Medicine

## 2013-04-04 DIAGNOSIS — M545 Low back pain, unspecified: Secondary | ICD-10-CM

## 2013-04-04 DIAGNOSIS — IMO0002 Reserved for concepts with insufficient information to code with codable children: Secondary | ICD-10-CM | POA: Diagnosis not present

## 2013-04-04 MED ORDER — IOHEXOL 180 MG/ML  SOLN
1.0000 mL | Freq: Once | INTRAMUSCULAR | Status: AC | PRN
  Administered 2013-04-04: 1 mL via EPIDURAL

## 2013-04-04 MED ORDER — METHYLPREDNISOLONE ACETATE 40 MG/ML INJ SUSP (RADIOLOG
120.0000 mg | Freq: Once | INTRAMUSCULAR | Status: AC
  Administered 2013-04-04: 120 mg via EPIDURAL

## 2013-04-04 NOTE — Discharge Instructions (Signed)

## 2013-08-29 DIAGNOSIS — R1013 Epigastric pain: Secondary | ICD-10-CM | POA: Diagnosis not present

## 2013-09-02 DIAGNOSIS — M542 Cervicalgia: Secondary | ICD-10-CM | POA: Diagnosis not present

## 2013-09-02 DIAGNOSIS — M546 Pain in thoracic spine: Secondary | ICD-10-CM | POA: Diagnosis not present

## 2013-09-02 DIAGNOSIS — M9981 Other biomechanical lesions of cervical region: Secondary | ICD-10-CM | POA: Diagnosis not present

## 2013-09-02 DIAGNOSIS — M5430 Sciatica, unspecified side: Secondary | ICD-10-CM | POA: Diagnosis not present

## 2013-11-06 DIAGNOSIS — Z0000 Encounter for general adult medical examination without abnormal findings: Secondary | ICD-10-CM | POA: Diagnosis not present

## 2013-11-06 DIAGNOSIS — Z125 Encounter for screening for malignant neoplasm of prostate: Secondary | ICD-10-CM | POA: Diagnosis not present

## 2013-11-06 DIAGNOSIS — Z79899 Other long term (current) drug therapy: Secondary | ICD-10-CM | POA: Diagnosis not present

## 2013-11-06 DIAGNOSIS — N400 Enlarged prostate without lower urinary tract symptoms: Secondary | ICD-10-CM | POA: Diagnosis not present

## 2013-11-06 DIAGNOSIS — I10 Essential (primary) hypertension: Secondary | ICD-10-CM | POA: Diagnosis not present

## 2013-11-06 DIAGNOSIS — E785 Hyperlipidemia, unspecified: Secondary | ICD-10-CM | POA: Diagnosis not present

## 2013-11-25 ENCOUNTER — Other Ambulatory Visit: Payer: Self-pay | Admitting: Family Medicine

## 2013-11-25 DIAGNOSIS — G8929 Other chronic pain: Secondary | ICD-10-CM

## 2013-11-25 DIAGNOSIS — M545 Low back pain, unspecified: Secondary | ICD-10-CM

## 2013-12-02 ENCOUNTER — Ambulatory Visit
Admission: RE | Admit: 2013-12-02 | Discharge: 2013-12-02 | Disposition: A | Discharge status: Home / Self Care | Payer: Medicare Other | Source: Ambulatory Visit | Attending: Family Medicine | Admitting: Family Medicine

## 2013-12-02 DIAGNOSIS — M545 Low back pain, unspecified: Secondary | ICD-10-CM

## 2013-12-02 DIAGNOSIS — M5416 Radiculopathy, lumbar region: Secondary | ICD-10-CM | POA: Diagnosis not present

## 2013-12-02 DIAGNOSIS — G8929 Other chronic pain: Secondary | ICD-10-CM

## 2013-12-02 DIAGNOSIS — M47817 Spondylosis without myelopathy or radiculopathy, lumbosacral region: Secondary | ICD-10-CM | POA: Diagnosis not present

## 2013-12-02 MED ORDER — METHYLPREDNISOLONE ACETATE 40 MG/ML INJ SUSP (RADIOLOG
120.0000 mg | Freq: Once | INTRAMUSCULAR | Status: AC
  Administered 2013-12-02: 120 mg via EPIDURAL

## 2013-12-02 MED ORDER — IOHEXOL 180 MG/ML  SOLN
1.0000 mL | Freq: Once | INTRAMUSCULAR | Status: AC | PRN
Start: 2013-12-02 — End: 2013-12-02
  Administered 2013-12-02: 1 mL via EPIDURAL

## 2013-12-02 NOTE — Discharge Instructions (Signed)

## 2013-12-25 DIAGNOSIS — M5441 Lumbago with sciatica, right side: Secondary | ICD-10-CM | POA: Diagnosis not present

## 2013-12-25 DIAGNOSIS — M546 Pain in thoracic spine: Secondary | ICD-10-CM | POA: Diagnosis not present

## 2013-12-25 DIAGNOSIS — M9903 Segmental and somatic dysfunction of lumbar region: Secondary | ICD-10-CM | POA: Diagnosis not present

## 2013-12-25 DIAGNOSIS — M9902 Segmental and somatic dysfunction of thoracic region: Secondary | ICD-10-CM | POA: Diagnosis not present

## 2013-12-29 ENCOUNTER — Emergency Department (HOSPITAL_COMMUNITY): Payer: Medicare Other

## 2013-12-29 ENCOUNTER — Emergency Department (HOSPITAL_COMMUNITY)
Admission: EM | Admit: 2013-12-29 | Discharge: 2013-12-29 | Disposition: A | Discharge status: Home / Self Care | Payer: Medicare Other | Attending: Emergency Medicine | Admitting: Emergency Medicine

## 2013-12-29 ENCOUNTER — Encounter (HOSPITAL_COMMUNITY): Payer: Self-pay | Admitting: *Deleted

## 2013-12-29 DIAGNOSIS — R519 Headache, unspecified: Secondary | ICD-10-CM

## 2013-12-29 DIAGNOSIS — Z8669 Personal history of other diseases of the nervous system and sense organs: Secondary | ICD-10-CM | POA: Diagnosis not present

## 2013-12-29 DIAGNOSIS — I10 Essential (primary) hypertension: Secondary | ICD-10-CM | POA: Diagnosis not present

## 2013-12-29 DIAGNOSIS — E785 Hyperlipidemia, unspecified: Secondary | ICD-10-CM | POA: Diagnosis not present

## 2013-12-29 DIAGNOSIS — Z87891 Personal history of nicotine dependence: Secondary | ICD-10-CM | POA: Insufficient documentation

## 2013-12-29 DIAGNOSIS — I517 Cardiomegaly: Secondary | ICD-10-CM | POA: Diagnosis not present

## 2013-12-29 DIAGNOSIS — Z79899 Other long term (current) drug therapy: Secondary | ICD-10-CM | POA: Diagnosis not present

## 2013-12-29 DIAGNOSIS — I6782 Cerebral ischemia: Secondary | ICD-10-CM | POA: Diagnosis not present

## 2013-12-29 DIAGNOSIS — R031 Nonspecific low blood-pressure reading: Secondary | ICD-10-CM | POA: Diagnosis not present

## 2013-12-29 DIAGNOSIS — R55 Syncope and collapse: Secondary | ICD-10-CM | POA: Insufficient documentation

## 2013-12-29 DIAGNOSIS — K219 Gastro-esophageal reflux disease without esophagitis: Secondary | ICD-10-CM | POA: Diagnosis not present

## 2013-12-29 DIAGNOSIS — R51 Headache: Secondary | ICD-10-CM | POA: Insufficient documentation

## 2013-12-29 LAB — URINALYSIS, ROUTINE W REFLEX MICROSCOPIC
Bilirubin Urine: NEGATIVE
GLUCOSE, UA: NEGATIVE mg/dL
Hgb urine dipstick: NEGATIVE
KETONES UR: NEGATIVE mg/dL
Leukocytes, UA: NEGATIVE
Nitrite: NEGATIVE
PH: 7 (ref 5.0–8.0)
SPECIFIC GRAVITY, URINE: 1.015 (ref 1.005–1.030)
Urobilinogen, UA: 0.2 mg/dL (ref 0.0–1.0)

## 2013-12-29 LAB — URINE MICROSCOPIC-ADD ON

## 2013-12-29 LAB — BASIC METABOLIC PANEL
Anion gap: 11 (ref 5–15)
BUN: 17 mg/dL (ref 6–23)
CHLORIDE: 99 meq/L (ref 96–112)
CO2: 27 meq/L (ref 19–32)
Calcium: 8.6 mg/dL (ref 8.4–10.5)
Creatinine, Ser: 1.07 mg/dL (ref 0.50–1.35)
GFR calc Af Amer: 75 mL/min — ABNORMAL LOW (ref >90)
GFR calc non Af Amer: 65 mL/min — ABNORMAL LOW (ref >90)
Glucose, Bld: 115 mg/dL — ABNORMAL HIGH (ref 70–99)
POTASSIUM: 3.7 meq/L (ref 3.7–5.3)
SODIUM: 137 meq/L (ref 137–147)

## 2013-12-29 LAB — CBC WITH DIFFERENTIAL/PLATELET
Basophils Absolute: 0 10*3/uL (ref 0.0–0.1)
Basophils Relative: 0 % (ref 0–1)
EOS ABS: 0.2 10*3/uL (ref 0.0–0.7)
Eosinophils Relative: 2 % (ref 0–5)
HCT: 40.3 % (ref 39.0–52.0)
HEMOGLOBIN: 13.3 g/dL (ref 13.0–17.0)
LYMPHS ABS: 3.4 10*3/uL (ref 0.7–4.0)
LYMPHS PCT: 46 % (ref 12–46)
MCH: 28.4 pg (ref 26.0–34.0)
MCHC: 33 g/dL (ref 30.0–36.0)
MCV: 85.9 fL (ref 78.0–100.0)
MONOS PCT: 7 % (ref 3–12)
Monocytes Absolute: 0.5 10*3/uL (ref 0.1–1.0)
NEUTROS PCT: 45 % (ref 43–77)
Neutro Abs: 3.3 10*3/uL (ref 1.7–7.7)
PLATELETS: 173 10*3/uL (ref 150–400)
RBC: 4.69 MIL/uL (ref 4.22–5.81)
RDW: 14.1 % (ref 11.5–15.5)
WBC: 7.4 10*3/uL (ref 4.0–10.5)

## 2013-12-29 LAB — TROPONIN I: Troponin I: <0.3 ng/mL (ref <0.30)
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
## 2013-12-29 NOTE — ED Notes (Signed)
Pt has ambulated several times without difficulty, pt states he is ready to go.

## 2013-12-29 NOTE — Discharge Instructions (Signed)
Near-Syncope Near-syncope (commonly known as near fainting) is sudden weakness, dizziness, or feeling like you might pass out. During an episode of near-syncope, you may also develop pale skin, have tunnel vision, or feel sick to your stomach (nauseous). Near-syncope may occur when getting up after sitting or while standing for a long time. It is caused by a sudden decrease in blood flow to the brain. This decrease can result from various causes or triggers, most of which are not serious. However, because near-syncope can sometimes be a sign of something serious, a medical evaluation is required. The specific cause is often not determined. HOME CARE INSTRUCTIONS  Monitor your condition for any changes. The following actions may help to alleviate any discomfort you are experiencing:  Have someone stay with you until you feel stable.  Lie down right away and prop your feet up if you start feeling like you might faint. Breathe deeply and steadily. Wait until all the symptoms have passed. Most of these episodes last only a few minutes. You may feel tired for several hours.   Drink enough fluids to keep your urine clear or pale yellow.   If you are taking blood pressure or heart medicine, get up slowly when seated or lying down. Take several minutes to sit and then stand. This can reduce dizziness.  Follow up with your health care provider as directed. SEEK IMMEDIATE MEDICAL CARE IF:   You have a severe headache.   You have unusual pain in the chest, abdomen, or back.   You are bleeding from the mouth or rectum, or you have black or tarry stool.   You have an irregular or very fast heartbeat.   You have repeated fainting or have seizure-like jerking during an episode.   You faint when sitting or lying down.   You have confusion.   You have difficulty walking.   You have severe weakness.   You have vision problems.  MAKE SURE YOU:   Understand these instructions.  Will  watch your condition.  Will get help right away if you are not doing well or get worse. Document Released: 01/30/2005 Document Revised: 02/04/2013 Document Reviewed: 07/05/2012 [employer] Patient Information 2015 [employer]. This information is not intended to replace advice given to you by your health care provider. Make sure you discuss any questions you have with your health care provider.  

## 2013-12-29 NOTE — ED Notes (Signed)
Pt alert & oriented x4, stable gait. Patient given discharge instructions, paperwork & prescription(s). Patient  instructed to stop at the registration desk to finish any additional paperwork. Patient verbalized understanding. Pt left department w/ no further questions. 
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
## 2013-12-30 DIAGNOSIS — R55 Syncope and collapse: Secondary | ICD-10-CM | POA: Diagnosis not present

## 2013-12-30 DIAGNOSIS — I9589 Other hypotension: Secondary | ICD-10-CM | POA: Diagnosis not present

## 2014-02-03 DIAGNOSIS — I10 Essential (primary) hypertension: Secondary | ICD-10-CM | POA: Diagnosis not present

## 2014-02-11 DIAGNOSIS — I10 Essential (primary) hypertension: Secondary | ICD-10-CM | POA: Diagnosis not present

## 2014-02-24 DIAGNOSIS — H5203 Hypermetropia, bilateral: Secondary | ICD-10-CM | POA: Diagnosis not present

## 2014-02-24 DIAGNOSIS — H2510 Age-related nuclear cataract, unspecified eye: Secondary | ICD-10-CM | POA: Diagnosis not present

## 2014-02-24 DIAGNOSIS — H52223 Regular astigmatism, bilateral: Secondary | ICD-10-CM | POA: Diagnosis not present

## 2014-02-24 DIAGNOSIS — H524 Presbyopia: Secondary | ICD-10-CM | POA: Diagnosis not present

## 2014-03-17 DIAGNOSIS — I10 Essential (primary) hypertension: Secondary | ICD-10-CM | POA: Diagnosis not present

## 2014-03-24 DIAGNOSIS — S80211A Abrasion, right knee, initial encounter: Secondary | ICD-10-CM | POA: Diagnosis not present

## 2014-03-24 DIAGNOSIS — W1830XA Fall on same level, unspecified, initial encounter: Secondary | ICD-10-CM | POA: Diagnosis not present

## 2014-03-24 DIAGNOSIS — Y9301 Activity, walking, marching and hiking: Secondary | ICD-10-CM | POA: Diagnosis not present

## 2014-03-31 DIAGNOSIS — I10 Essential (primary) hypertension: Secondary | ICD-10-CM | POA: Diagnosis not present

## 2014-04-06 ENCOUNTER — Encounter (INDEPENDENT_AMBULATORY_CARE_PROVIDER_SITE_OTHER): Payer: Self-pay | Admitting: *Deleted

## 2014-04-09 ENCOUNTER — Other Ambulatory Visit (INDEPENDENT_AMBULATORY_CARE_PROVIDER_SITE_OTHER): Payer: Self-pay | Admitting: *Deleted

## 2014-04-09 ENCOUNTER — Encounter (INDEPENDENT_AMBULATORY_CARE_PROVIDER_SITE_OTHER): Payer: Self-pay | Admitting: *Deleted

## 2014-04-09 ENCOUNTER — Telehealth (INDEPENDENT_AMBULATORY_CARE_PROVIDER_SITE_OTHER): Payer: Self-pay | Admitting: *Deleted

## 2014-04-09 DIAGNOSIS — K2270 Barrett's esophagus without dysplasia: Secondary | ICD-10-CM

## 2014-04-09 NOTE — Telephone Encounter (Signed)
Referring MD/PCP: [email or web address]   Procedure: egd  Reason/Indication:  Barrett's esophagus  Has patient had this procedure before?  Yes, 2012  If so, when, by whom and where?    Is there a family history of colon cancer?    Who?  What age when diagnosed?    Is patient diabetic?   no      Does patient have prosthetic heart valve?  no  Do you have a pacemaker?  no  Has patient ever had endocarditis? no  Has patient had joint replacement within last 12 months?  no  Does patient tend to be constipated or take laxatives?   Is patient on Coumadin, Plavix and/or Aspirin? no  Medications: see epic   Allergies: nkda  Medication Adjustment:   Procedure date & time: 04/24/14 at 815

## 2014-04-10 ENCOUNTER — Encounter (INDEPENDENT_AMBULATORY_CARE_PROVIDER_SITE_OTHER): Payer: Self-pay | Admitting: *Deleted

## 2014-04-10 NOTE — Progress Notes (Signed)
This encounter was created in error - please disregard.

## 2014-04-13 NOTE — Telephone Encounter (Signed)
agree

## 2014-04-24 ENCOUNTER — Encounter (HOSPITAL_COMMUNITY)
Admission: RE | Disposition: A | Discharge status: Home / Self Care | Payer: Self-pay | Source: Ambulatory Visit | Attending: Internal Medicine

## 2014-04-24 ENCOUNTER — Encounter (HOSPITAL_COMMUNITY): Payer: Self-pay | Admitting: *Deleted

## 2014-04-24 ENCOUNTER — Ambulatory Visit (HOSPITAL_COMMUNITY)
Admission: RE | Admit: 2014-04-24 | Discharge: 2014-04-24 | Disposition: A | Discharge status: Home / Self Care | Payer: Medicare Other | Source: Ambulatory Visit | Attending: Internal Medicine | Admitting: Internal Medicine

## 2014-04-24 DIAGNOSIS — K228 Other specified diseases of esophagus: Secondary | ICD-10-CM | POA: Insufficient documentation

## 2014-04-24 DIAGNOSIS — K449 Diaphragmatic hernia without obstruction or gangrene: Secondary | ICD-10-CM | POA: Diagnosis not present

## 2014-04-24 DIAGNOSIS — E780 Pure hypercholesterolemia: Secondary | ICD-10-CM | POA: Insufficient documentation

## 2014-04-24 DIAGNOSIS — F17210 Nicotine dependence, cigarettes, uncomplicated: Secondary | ICD-10-CM | POA: Insufficient documentation

## 2014-04-24 DIAGNOSIS — K219 Gastro-esophageal reflux disease without esophagitis: Secondary | ICD-10-CM | POA: Diagnosis not present

## 2014-04-24 DIAGNOSIS — K2270 Barrett's esophagus without dysplasia: Secondary | ICD-10-CM | POA: Diagnosis not present

## 2014-04-24 HISTORY — PX: ESOPHAGOGASTRODUODENOSCOPY: SHX5428

## 2014-04-24 SURGERY — EGD (ESOPHAGOGASTRODUODENOSCOPY)
Anesthesia: Moderate Sedation

## 2014-04-24 MED ORDER — BUTAMBEN-TETRACAINE-BENZOCAINE 2-2-14 % EX AERO
INHALATION_SPRAY | CUTANEOUS | Status: DC | PRN
  Administered 2014-04-24: 2 via TOPICAL

## 2014-04-24 MED ORDER — MIDAZOLAM HCL 5 MG/5ML IJ SOLN
INTRAMUSCULAR | Status: DC | PRN
  Administered 2014-04-24: 1 mg via INTRAVENOUS
  Administered 2014-04-24 (×2): 2 mg via INTRAVENOUS
  Administered 2014-04-24: 1 mg via INTRAVENOUS

## 2014-04-24 MED ORDER — MIDAZOLAM HCL 5 MG/5ML IJ SOLN
INTRAMUSCULAR | Status: AC
  Filled 2014-04-24: qty 10

## 2014-04-24 MED ORDER — SIMETHICONE 40 MG/0.6ML PO SUSP
ORAL | Status: DC | PRN
  Administered 2014-04-24: 08:00:00

## 2014-04-24 MED ORDER — MEPERIDINE HCL 50 MG/ML IJ SOLN
INTRAMUSCULAR | Status: DC | PRN
  Administered 2014-04-24 (×2): 25 mg via INTRAVENOUS

## 2014-04-24 MED ORDER — MEPERIDINE HCL 50 MG/ML IJ SOLN
INTRAMUSCULAR | Status: AC
  Filled 2014-04-24: qty 1

## 2014-04-24 MED ORDER — SODIUM CHLORIDE 0.9 % IV SOLN
INTRAVENOUS | Status: DC
  Administered 2014-04-24: 08:00:00 via INTRAVENOUS

## 2014-04-24 NOTE — Op Note (Signed)
EGD PROCEDURE REPORT  PATIENT:  Henry [name]  MR#:  [id number] Birthdate:  07/15/1934, 80 y.o., male Endoscopist:  Dr. [name], MD Referred By:  Dr. Lanette Hampshire, MD  Procedure Date: 04/24/2014  Procedure:   EGD  Indications:  Patient is 80 year old Caucasian male with chronic GERD complicated by short segment Barrett's esophagus was here for surveillance EGD. He states heartburns well controlled with therapy and he denies dysphagia nausea vomiting melena or epigastric pain. Last EGD was in September 2012.            Informed Consent:  The risks, benefits, alternatives & imponderables which include, but are not limited to, bleeding, infection, perforation, drug reaction and potential missed lesion have been reviewed.  The potential for biopsy, lesion removal, esophageal dilation, etc. have also been discussed.  Questions have been answered.  All parties agreeable.  Please see history & physical in medical record for more information.  Medications:  Demerol 50 mg IV Versed 6 mg IV Cetacaine spray topically for oropharyngeal anesthesia  Description of procedure:  The endoscope was introduced through the mouth and advanced to the second portion of the duodenum without difficulty or limitations. The mucosal surfaces were surveyed very carefully during advancement of the scope and upon withdrawal.  Findings:  Esophagus:  Mucosa of the proximal and middle segment was normal. 2 islands of salmon colored mucosa were noted proximal to GE junction. All in on the right side was about 10 mm x 5 mm. Other patch was small. 2 small patches of salmon mucosa noted contiguous to GE junction. No ring or stricture noted. GEJ:  38 cm Hiatus:  40 cm Stomach:  Stomach was empty and distended very well with insufflation. Folds in the proximal stomach were normal. Examination of mucosa at gastric body, antrum, pyloric channel, angularis fundus and cardia was normal. Duodenum:  Normal bulbar and post  bulbar mucosa.  Therapeutic/Diagnostic Maneuvers Performed:   Biopsy taken from small patches of Barrett's mucosa and most of it was ablated in this fashion.  Complications: None  Impression: Short segment Barrett's esophagus. Multiple biopsies taken and most of the Barrett's mucosa was removed. Small sliding hiatal hernia.   Recommendations:  Standard instructions given. I will be contacting patient with biopsy results.  REHMAN,NAJEEB U  04/24/2014  9:07 AM  CC: Dr. Lanette Hampshire, MD & Dr. Rayne Du ref. provider found

## 2014-04-24 NOTE — Discharge Instructions (Signed)
Resume usual medications and diet. No driving for 24 hours. Physician will call with biopsy results next week..  Gastrointestinal Endoscopy, Care After Refer to this sheet in the next few weeks. These instructions provide you with information on caring for yourself after your procedure. Your caregiver may also give you more specific instructions. Your treatment has been planned according to current medical practices, but problems sometimes occur. Call your caregiver if you have any problems or questions after your procedure. HOME CARE INSTRUCTIONS  If you were given medicine to help you relax (sedative), do not drive, operate machinery, or sign important documents for 24 hours.  Avoid alcohol and hot or warm beverages for the first 24 hours after the procedure.  Only take over-the-counter or prescription medicines for pain, discomfort, or fever as directed by your caregiver. You may resume taking your normal medicines unless your caregiver tells you otherwise. Ask your caregiver when you may resume taking medicines that may cause bleeding, such as aspirin, clopidogrel, or warfarin.  You may return to your normal diet and activities on the day after your procedure, or as directed by your caregiver. Walking may help to reduce any bloated feeling in your abdomen.  Drink enough fluids to keep your urine clear or pale yellow.  You may gargle with salt water if you have a sore throat. SEEK IMMEDIATE MEDICAL CARE IF:  You have severe nausea or vomiting.  You have severe abdominal pain, abdominal cramps that last longer than 6 hours, or abdominal swelling (distention).  You have severe shoulder or back pain.  You have trouble swallowing.  You have shortness of breath, your breathing is shallow, or you are breathing faster than normal.  You have a fever or a rapid heartbeat.  You vomit blood or material that looks like coffee grounds.  You have bloody, black, or tarry stools. MAKE SURE  YOU:  Understand these instructions.  Will watch your condition.  Will get help right away if you are not doing well or get worse. Document Released: 09/14/2003 Document Revised: 06/16/2013 Document Reviewed: 05/02/2011 [facility] Patient Information 2015 [place], [place]. This information is not intended to replace advice given to you by your health care provider. Make sure you discuss any questions you have with your health care provider.

## 2014-04-24 NOTE — H&P (Signed)
Henry [name] is an 80 y.o. male.   Chief Complaint: Patient is here for EGD. HPI: Patient is 80 year old Caucasian male was chronic GERD complicated by short segment Barrett's esophagus. Last EGD was in September 2012. Heartburns well controlled with therapy. He denies dysphagia nausea vomiting and epigastric pain or melena.  Past Medical History  Diagnosis Date  . Hypertension   . GERD (gastroesophageal reflux disease)   . Hypercholesteremia   . HOH (hard of hearing)     Past Surgical History  Procedure Laterality Date  . Shoulder surgery Right     torn cartiledge  . Esophagogastroduodenoscopy  10/28/2010    Procedure: ESOPHAGOGASTRODUODENOSCOPY (EGD);  Surgeon: [name], MD;  [facility];  Service: Endoscopy;  Laterality: N/A;  10:30  . Colonoscopy    . Colonoscopy  05/17/2011    Procedure: COLONOSCOPY;  Surgeon: [name], MD;  [facility];  Service: Endoscopy;  Laterality: N/A;  830  . Resection distal clavical Right 12/13/2012    Procedure: RESECTION DISTAL CLAVICAL;  Surgeon: Carole Civil, MD;  Location: [facility];  Service: Orthopedics;  Laterality: Right;  . Shoulder arthroscopy with bicepstenotomy Right 12/13/2012    Procedure: SHOULDER ARTHROSCOPY WITH BICEPS TENOTOMY, limited shoulder debridement;  Surgeon: Carole Civil, MD;  Location: [facility];  Service: Orthopedics;  Laterality: Right;    Family History  Problem Relation Age of Onset  . Colon cancer Neg Hx    Social History:  reports that he quit smoking about 45 years ago. His smoking use included Cigarettes. He has a 17.5 pack-year smoking history. He does not have any smokeless tobacco history on file. He reports that he drinks about 1.8 oz of alcohol per week. He reports that he does not use illicit drugs.  Allergies: No Known Allergies  Medications Prior to Admission  Medication Sig Dispense Refill  . atorvastatin (LIPITOR) 40 MG tablet Take 40 mg by mouth every  evening.     . cholecalciferol (VITAMIN D) 1000 UNITS tablet Take 1,000 Units by mouth daily.    Marland Kitchen esomeprazole (NEXIUM) 40 MG capsule Take 40 mg by mouth daily.    . fish oil-omega-3 fatty acids 1000 MG capsule Take 1 g by mouth daily.    . hydrochlorothiazide (HYDRODIURIL) 25 MG tablet Take 25 mg by mouth daily.    Marland Kitchen telmisartan (MICARDIS) 80 MG tablet Take 80 mg by mouth daily.    Marland Kitchen HYDROcodone-acetaminophen (NORCO) 7.5-325 MG per tablet Take 1 tablet by mouth every 4 (four) hours as needed for pain. (Patient not taking: Reported on 04/13/2014) 60 tablet 0  . methocarbamol (ROBAXIN) 500 MG tablet Take 1 tablet (500 mg total) by mouth 3 (three) times daily. (Patient not taking: Reported on 12/29/2013) 60 tablet 1  . promethazine (PHENERGAN) 12.5 MG tablet Take 1 tablet (12.5 mg total) by mouth every 6 (six) hours as needed for nausea. (Patient not taking: Reported on 12/29/2013) 30 tablet 0    No results found for this or any previous visit (from the past 48 hour(s)). No results found.  ROS  Blood pressure 150/73, pulse 55, temperature 98 F (36.7 C), temperature source Oral, resp. rate 18, SpO2 98 %. Physical Exam  Constitutional: He appears well-developed and well-nourished.  HENT:  Mouth/Throat: Oropharynx is clear and moist.  Eyes: Conjunctivae are normal. No scleral icterus.  Neck: No thyromegaly present.  Cardiovascular: Normal rate, regular rhythm and normal heart sounds.   No murmur heard. Respiratory: Effort normal  and breath sounds normal.  GI: Soft. He exhibits no distension and no mass. There is no tenderness.  Musculoskeletal: He exhibits no edema.  Lymphadenopathy:    He has no cervical adenopathy.  Neurological: He is alert.  Skin: Skin is warm and dry.     Assessment/Plan Chronic GERD complicated by short segment Barrett's esophagus. Surveillance EGD.  REHMAN,NAJEEB U 04/24/2014, 8:40 AM

## 2014-04-27 ENCOUNTER — Encounter (HOSPITAL_COMMUNITY): Payer: Self-pay | Admitting: Internal Medicine

## 2014-04-27 DIAGNOSIS — I10 Essential (primary) hypertension: Secondary | ICD-10-CM | POA: Diagnosis not present

## 2014-05-01 ENCOUNTER — Encounter (INDEPENDENT_AMBULATORY_CARE_PROVIDER_SITE_OTHER): Payer: Self-pay | Admitting: *Deleted

## 2014-05-15 ENCOUNTER — Ambulatory Visit (HOSPITAL_COMMUNITY)
Admission: RE | Admit: 2014-05-15 | Discharge: 2014-05-15 | Disposition: A | Discharge status: Home / Self Care | Payer: Medicare Other | Source: Ambulatory Visit | Attending: Family Medicine | Admitting: Family Medicine

## 2014-05-15 ENCOUNTER — Other Ambulatory Visit (HOSPITAL_COMMUNITY): Payer: Self-pay | Admitting: Family Medicine

## 2014-05-15 DIAGNOSIS — M545 Low back pain, unspecified: Secondary | ICD-10-CM

## 2014-05-15 DIAGNOSIS — R312 Other microscopic hematuria: Secondary | ICD-10-CM | POA: Insufficient documentation

## 2014-05-15 DIAGNOSIS — M5136 Other intervertebral disc degeneration, lumbar region: Secondary | ICD-10-CM | POA: Diagnosis not present

## 2014-05-15 DIAGNOSIS — M47896 Other spondylosis, lumbar region: Secondary | ICD-10-CM | POA: Insufficient documentation

## 2014-05-15 DIAGNOSIS — M47816 Spondylosis without myelopathy or radiculopathy, lumbar region: Secondary | ICD-10-CM | POA: Diagnosis not present

## 2014-05-15 HISTORY — PX: OTHER SURGICAL HISTORY: SHX169

## 2014-05-19 DIAGNOSIS — R319 Hematuria, unspecified: Secondary | ICD-10-CM | POA: Diagnosis not present

## 2014-05-19 DIAGNOSIS — M9903 Segmental and somatic dysfunction of lumbar region: Secondary | ICD-10-CM | POA: Diagnosis not present

## 2014-05-19 DIAGNOSIS — M546 Pain in thoracic spine: Secondary | ICD-10-CM | POA: Diagnosis not present

## 2014-05-19 DIAGNOSIS — M9902 Segmental and somatic dysfunction of thoracic region: Secondary | ICD-10-CM | POA: Diagnosis not present

## 2014-05-19 DIAGNOSIS — M5441 Lumbago with sciatica, right side: Secondary | ICD-10-CM | POA: Diagnosis not present

## 2014-05-21 ENCOUNTER — Other Ambulatory Visit: Payer: Self-pay | Admitting: Family Medicine

## 2014-05-21 DIAGNOSIS — M5136 Other intervertebral disc degeneration, lumbar region: Secondary | ICD-10-CM

## 2014-05-22 ENCOUNTER — Ambulatory Visit
Admission: RE | Admit: 2014-05-22 | Discharge: 2014-05-22 | Disposition: A | Discharge status: Home / Self Care | Payer: Medicare Other | Source: Ambulatory Visit | Attending: Family Medicine | Admitting: Family Medicine

## 2014-05-22 DIAGNOSIS — M5136 Other intervertebral disc degeneration, lumbar region: Secondary | ICD-10-CM | POA: Diagnosis not present

## 2014-05-22 HISTORY — PX: OTHER SURGICAL HISTORY: SHX169

## 2014-05-22 MED ORDER — METHYLPREDNISOLONE ACETATE 40 MG/ML INJ SUSP (RADIOLOG
120.0000 mg | Freq: Once | INTRAMUSCULAR | Status: AC
  Administered 2014-05-22: 120 mg via EPIDURAL

## 2014-05-22 MED ORDER — IOHEXOL 180 MG/ML  SOLN
1.0000 mL | Freq: Once | INTRAMUSCULAR | Status: AC | PRN
  Administered 2014-05-22: 1 mL via EPIDURAL

## 2014-05-22 NOTE — Discharge Instructions (Signed)

## 2014-05-23 ENCOUNTER — Emergency Department (HOSPITAL_COMMUNITY): Payer: Medicare Other

## 2014-05-23 ENCOUNTER — Inpatient Hospital Stay (HOSPITAL_COMMUNITY): Payer: Medicare Other | Admitting: Anesthesiology

## 2014-05-23 ENCOUNTER — Encounter (HOSPITAL_COMMUNITY): Payer: Self-pay

## 2014-05-23 ENCOUNTER — Inpatient Hospital Stay (HOSPITAL_COMMUNITY)
Admission: EM | Admit: 2014-05-23 | Discharge: 2014-05-26 | DRG: 023 | Disposition: A | Discharge status: Rehabilitation Facility | Payer: Medicare Other | Attending: Neurology | Admitting: Neurology

## 2014-05-23 ENCOUNTER — Encounter (HOSPITAL_COMMUNITY)
Admission: EM | Disposition: A | Discharge status: Rehabilitation Facility | Payer: Self-pay | Source: Home / Self Care | Attending: Neurology

## 2014-05-23 ENCOUNTER — Inpatient Hospital Stay (HOSPITAL_COMMUNITY): Payer: Medicare Other

## 2014-05-23 DIAGNOSIS — R739 Hyperglycemia, unspecified: Secondary | ICD-10-CM | POA: Diagnosis present

## 2014-05-23 DIAGNOSIS — Z79899 Other long term (current) drug therapy: Secondary | ICD-10-CM | POA: Diagnosis not present

## 2014-05-23 DIAGNOSIS — E780 Pure hypercholesterolemia: Secondary | ICD-10-CM | POA: Diagnosis present

## 2014-05-23 DIAGNOSIS — I959 Hypotension, unspecified: Secondary | ICD-10-CM | POA: Diagnosis present

## 2014-05-23 DIAGNOSIS — D62 Acute posthemorrhagic anemia: Secondary | ICD-10-CM | POA: Diagnosis not present

## 2014-05-23 DIAGNOSIS — Z4682 Encounter for fitting and adjustment of non-vascular catheter: Secondary | ICD-10-CM | POA: Diagnosis not present

## 2014-05-23 DIAGNOSIS — R339 Retention of urine, unspecified: Secondary | ICD-10-CM | POA: Diagnosis not present

## 2014-05-23 DIAGNOSIS — J9601 Acute respiratory failure with hypoxia: Secondary | ICD-10-CM

## 2014-05-23 DIAGNOSIS — I700 Atherosclerosis of aorta: Secondary | ICD-10-CM | POA: Diagnosis not present

## 2014-05-23 DIAGNOSIS — H9190 Unspecified hearing loss, unspecified ear: Secondary | ICD-10-CM | POA: Diagnosis present

## 2014-05-23 DIAGNOSIS — Z87891 Personal history of nicotine dependence: Secondary | ICD-10-CM | POA: Diagnosis not present

## 2014-05-23 DIAGNOSIS — I69354 Hemiplegia and hemiparesis following cerebral infarction affecting left non-dominant side: Secondary | ICD-10-CM | POA: Diagnosis not present

## 2014-05-23 DIAGNOSIS — G8191 Hemiplegia, unspecified affecting right dominant side: Secondary | ICD-10-CM | POA: Diagnosis present

## 2014-05-23 DIAGNOSIS — R4701 Aphasia: Secondary | ICD-10-CM | POA: Diagnosis present

## 2014-05-23 DIAGNOSIS — I63412 Cerebral infarction due to embolism of left middle cerebral artery: Secondary | ICD-10-CM | POA: Diagnosis present

## 2014-05-23 DIAGNOSIS — J9690 Respiratory failure, unspecified, unspecified whether with hypoxia or hypercapnia: Secondary | ICD-10-CM | POA: Diagnosis not present

## 2014-05-23 DIAGNOSIS — R29810 Facial weakness: Secondary | ICD-10-CM | POA: Diagnosis present

## 2014-05-23 DIAGNOSIS — I10 Essential (primary) hypertension: Secondary | ICD-10-CM | POA: Diagnosis not present

## 2014-05-23 DIAGNOSIS — R29898 Other symptoms and signs involving the musculoskeletal system: Secondary | ICD-10-CM | POA: Diagnosis not present

## 2014-05-23 DIAGNOSIS — I6602 Occlusion and stenosis of left middle cerebral artery: Secondary | ICD-10-CM | POA: Diagnosis not present

## 2014-05-23 DIAGNOSIS — I6522 Occlusion and stenosis of left carotid artery: Secondary | ICD-10-CM | POA: Diagnosis present

## 2014-05-23 DIAGNOSIS — R401 Stupor: Secondary | ICD-10-CM | POA: Diagnosis not present

## 2014-05-23 DIAGNOSIS — I6789 Other cerebrovascular disease: Secondary | ICD-10-CM | POA: Diagnosis not present

## 2014-05-23 DIAGNOSIS — R400 Somnolence: Secondary | ICD-10-CM | POA: Diagnosis not present

## 2014-05-23 DIAGNOSIS — T43215A Adverse effect of selective serotonin and norepinephrine reuptake inhibitors, initial encounter: Secondary | ICD-10-CM | POA: Diagnosis not present

## 2014-05-23 DIAGNOSIS — I517 Cardiomegaly: Secondary | ICD-10-CM | POA: Diagnosis not present

## 2014-05-23 DIAGNOSIS — Z9981 Dependence on supplemental oxygen: Secondary | ICD-10-CM | POA: Diagnosis not present

## 2014-05-23 DIAGNOSIS — N401 Enlarged prostate with lower urinary tract symptoms: Secondary | ICD-10-CM | POA: Diagnosis not present

## 2014-05-23 DIAGNOSIS — I69321 Dysphasia following cerebral infarction: Secondary | ICD-10-CM | POA: Diagnosis not present

## 2014-05-23 DIAGNOSIS — I63132 Cerebral infarction due to embolism of left carotid artery: Secondary | ICD-10-CM | POA: Diagnosis not present

## 2014-05-23 DIAGNOSIS — R531 Weakness: Secondary | ICD-10-CM | POA: Insufficient documentation

## 2014-05-23 DIAGNOSIS — N138 Other obstructive and reflux uropathy: Secondary | ICD-10-CM | POA: Diagnosis not present

## 2014-05-23 DIAGNOSIS — M6281 Muscle weakness (generalized): Secondary | ICD-10-CM | POA: Diagnosis not present

## 2014-05-23 DIAGNOSIS — K219 Gastro-esophageal reflux disease without esophagitis: Secondary | ICD-10-CM | POA: Diagnosis present

## 2014-05-23 DIAGNOSIS — T45515A Adverse effect of anticoagulants, initial encounter: Secondary | ICD-10-CM | POA: Diagnosis not present

## 2014-05-23 DIAGNOSIS — R062 Wheezing: Secondary | ICD-10-CM | POA: Diagnosis not present

## 2014-05-23 DIAGNOSIS — K869 Disease of pancreas, unspecified: Secondary | ICD-10-CM | POA: Diagnosis not present

## 2014-05-23 DIAGNOSIS — E785 Hyperlipidemia, unspecified: Secondary | ICD-10-CM | POA: Diagnosis present

## 2014-05-23 DIAGNOSIS — Q251 Coarctation of aorta: Secondary | ICD-10-CM | POA: Diagnosis not present

## 2014-05-23 DIAGNOSIS — H0520 Unspecified exophthalmos: Secondary | ICD-10-CM | POA: Diagnosis not present

## 2014-05-23 DIAGNOSIS — I639 Cerebral infarction, unspecified: Secondary | ICD-10-CM | POA: Diagnosis not present

## 2014-05-23 DIAGNOSIS — I69320 Aphasia following cerebral infarction: Secondary | ICD-10-CM | POA: Diagnosis not present

## 2014-05-23 DIAGNOSIS — I63512 Cerebral infarction due to unspecified occlusion or stenosis of left middle cerebral artery: Secondary | ICD-10-CM | POA: Diagnosis not present

## 2014-05-23 DIAGNOSIS — R310 Gross hematuria: Secondary | ICD-10-CM | POA: Diagnosis not present

## 2014-05-23 DIAGNOSIS — R40241 Glasgow coma scale score 13-15: Secondary | ICD-10-CM | POA: Diagnosis not present

## 2014-05-23 DIAGNOSIS — Z9889 Other specified postprocedural states: Secondary | ICD-10-CM | POA: Diagnosis not present

## 2014-05-23 DIAGNOSIS — M6289 Other specified disorders of muscle: Secondary | ICD-10-CM | POA: Diagnosis not present

## 2014-05-23 DIAGNOSIS — Z7401 Bed confinement status: Secondary | ICD-10-CM | POA: Diagnosis not present

## 2014-05-23 HISTORY — PX: RADIOLOGY WITH ANESTHESIA: SHX6223

## 2014-05-23 LAB — COMPREHENSIVE METABOLIC PANEL
ALBUMIN: 3.7 g/dL (ref 3.5–5.2)
ALT: 28 U/L (ref 0–53)
AST: 22 U/L (ref 0–37)
Alkaline Phosphatase: 52 U/L (ref 39–117)
Anion gap: 11 (ref 5–15)
BUN: 19 mg/dL (ref 6–23)
CALCIUM: 9 mg/dL (ref 8.4–10.5)
CO2: 24 mmol/L (ref 19–32)
Chloride: 103 mmol/L (ref 96–112)
Creatinine, Ser: 0.91 mg/dL (ref 0.50–1.35)
GFR calc Af Amer: >90 mL/min (ref >90)
GFR calc non Af Amer: 80 mL/min — ABNORMAL LOW (ref >90)
GLUCOSE: 137 mg/dL — AB (ref 70–99)
POTASSIUM: 4.3 mmol/L (ref 3.5–5.1)
Sodium: 138 mmol/L (ref 135–145)
TOTAL PROTEIN: 7.1 g/dL (ref 6.0–8.3)
Total Bilirubin: 0.6 mg/dL (ref 0.3–1.2)

## 2014-05-23 LAB — DIFFERENTIAL
Basophils Absolute: 0 10*3/uL (ref 0.0–0.1)
Basophils Relative: 0 % (ref 0–1)
Eosinophils Absolute: 0 10*3/uL (ref 0.0–0.7)
Eosinophils Relative: 0 % (ref 0–5)
LYMPHS ABS: 1.5 10*3/uL (ref 0.7–4.0)
Lymphocytes Relative: 18 % (ref 12–46)
Monocytes Absolute: 0.4 10*3/uL (ref 0.1–1.0)
Monocytes Relative: 5 % (ref 3–12)
NEUTROS ABS: 6.5 10*3/uL (ref 1.7–7.7)
NEUTROS PCT: 77 % (ref 43–77)

## 2014-05-23 LAB — POCT I-STAT 3, ART BLOOD GAS (G3+)
ACID-BASE DEFICIT: 6 mmol/L — AB (ref 0.0–2.0)
BICARBONATE: 20.1 meq/L (ref 20.0–24.0)
O2 Saturation: 99 %
PH ART: 7.316 — AB (ref 7.350–7.450)
Patient temperature: 96.3
TCO2: 21 mmol/L (ref 0–100)
pCO2 arterial: 38.8 mmHg (ref 35.0–45.0)
pO2, Arterial: 135 mmHg — ABNORMAL HIGH (ref 80.0–100.0)

## 2014-05-23 LAB — CBC
HCT: 42.3 % (ref 39.0–52.0)
Hemoglobin: 13.9 g/dL (ref 13.0–17.0)
MCH: 28.1 pg (ref 26.0–34.0)
MCHC: 32.9 g/dL (ref 30.0–36.0)
MCV: 85.5 fL (ref 78.0–100.0)
PLATELETS: 226 10*3/uL (ref 150–400)
RBC: 4.95 MIL/uL (ref 4.22–5.81)
RDW: 13.1 % (ref 11.5–15.5)
WBC: 8.5 10*3/uL (ref 4.0–10.5)

## 2014-05-23 LAB — RAPID URINE DRUG SCREEN, HOSP PERFORMED
Amphetamines: NOT DETECTED
BENZODIAZEPINES: NOT DETECTED
Barbiturates: NOT DETECTED
Cocaine: NOT DETECTED
Opiates: NOT DETECTED
Tetrahydrocannabinol: NOT DETECTED

## 2014-05-23 LAB — MRSA PCR SCREENING: MRSA by PCR: NEGATIVE

## 2014-05-23 LAB — URINALYSIS, ROUTINE W REFLEX MICROSCOPIC
Bilirubin Urine: NEGATIVE
Glucose, UA: NEGATIVE mg/dL
Hgb urine dipstick: NEGATIVE
Ketones, ur: NEGATIVE mg/dL
LEUKOCYTES UA: NEGATIVE
NITRITE: NEGATIVE
Protein, ur: NEGATIVE mg/dL
SPECIFIC GRAVITY, URINE: 1.029 (ref 1.005–1.030)
UROBILINOGEN UA: 0.2 mg/dL (ref 0.0–1.0)
pH: 5.5 (ref 5.0–8.0)

## 2014-05-23 LAB — PROTIME-INR
INR: 1.06 (ref 0.00–1.49)
Prothrombin Time: 13.9 seconds (ref 11.6–15.2)

## 2014-05-23 LAB — APTT: aPTT: 27 seconds (ref 24–37)

## 2014-05-23 LAB — ETHANOL

## 2014-05-23 LAB — TROPONIN I

## 2014-05-23 LAB — CBG MONITORING, ED: GLUCOSE-CAPILLARY: 135 mg/dL — AB (ref 70–99)

## 2014-05-23 LAB — PLATELET INHIBITION P2Y12: Platelet Function  P2Y12: 113 [PRU] — ABNORMAL LOW (ref 194–418)

## 2014-05-23 SURGERY — RADIOLOGY WITH ANESTHESIA
Anesthesia: General

## 2014-05-23 MED ORDER — NICARDIPINE HCL IN NACL 20-0.86 MG/200ML-% IV SOLN: 5.0000 mg/h | INTRAVENOUS | Status: DC

## 2014-05-23 MED ORDER — PANTOPRAZOLE SODIUM 40 MG IV SOLR
40.0000 mg | Freq: Every day | INTRAVENOUS | Status: DC
  Administered 2014-05-23 – 2014-05-26 (×4): 40 mg via INTRAVENOUS
  Filled 2014-05-23 (×3): qty 40

## 2014-05-23 MED ORDER — ALBUTEROL SULFATE (2.5 MG/3ML) 0.083% IN NEBU
2.5000 mg | INHALATION_SOLUTION | RESPIRATORY_TRACT | Status: DC | PRN
Start: 2014-05-23 — End: 2014-05-26
  Administered 2014-05-25 (×2): 2.5 mg via RESPIRATORY_TRACT
  Filled 2014-05-23 (×2): qty 3

## 2014-05-23 MED ORDER — FENTANYL CITRATE 0.05 MG/ML IJ SOLN
INTRAMUSCULAR | Status: DC | PRN
  Administered 2014-05-23: 150 ug via INTRAVENOUS
  Administered 2014-05-23: 80 ug via INTRAVENOUS
  Administered 2014-05-23 (×3): 100 ug via INTRAVENOUS

## 2014-05-23 MED ORDER — IOHEXOL 350 MG/ML SOLN
50.0000 mL | Freq: Once | INTRAVENOUS | Status: AC | PRN
  Administered 2014-05-23: 50 mL via INTRAVENOUS

## 2014-05-23 MED ORDER — LIDOCAINE HCL 1 % IJ SOLN
INTRAMUSCULAR | Status: AC
Start: 2014-05-23 — End: 2014-05-23
  Filled 2014-05-23: qty 20

## 2014-05-23 MED ORDER — SODIUM CHLORIDE 0.9 % IV BOLUS (SEPSIS)
1000.0000 mL | Freq: Once | INTRAVENOUS | Status: AC
  Administered 2014-05-23: 1000 mL via INTRAVENOUS

## 2014-05-23 MED ORDER — CLOPIDOGREL BISULFATE 75 MG PO TABS
75.0000 mg | ORAL_TABLET | Freq: Every day | ORAL | Status: DC
  Administered 2014-05-24 – 2014-05-26 (×3): 75 mg via ORAL
  Filled 2014-05-23 (×5): qty 1

## 2014-05-23 MED ORDER — GLYCOPYRROLATE 0.2 MG/ML IJ SOLN
INTRAMUSCULAR | Status: DC | PRN
  Administered 2014-05-23: 0.4 mg via INTRAVENOUS

## 2014-05-23 MED ORDER — PROPOFOL 10 MG/ML IV BOLUS
INTRAVENOUS | Status: DC | PRN
  Administered 2014-05-23: 200 mg via INTRAVENOUS

## 2014-05-23 MED ORDER — ONDANSETRON HCL 4 MG/2ML IJ SOLN: 4.0000 mg | Freq: Four times a day (QID) | INTRAMUSCULAR | Status: DC | PRN

## 2014-05-23 MED ORDER — SUCCINYLCHOLINE CHLORIDE 20 MG/ML IJ SOLN
INTRAMUSCULAR | Status: DC | PRN
  Administered 2014-05-23: 120 mg via INTRAVENOUS

## 2014-05-23 MED ORDER — EPHEDRINE SULFATE 50 MG/ML IJ SOLN
INTRAMUSCULAR | Status: DC | PRN
  Administered 2014-05-23 (×2): 5 mg via INTRAVENOUS
  Administered 2014-05-23: 10 mg via INTRAVENOUS

## 2014-05-23 MED ORDER — VECURONIUM BROMIDE 10 MG IV SOLR
INTRAVENOUS | Status: DC | PRN
  Administered 2014-05-23 (×2): 10 mg via INTRAVENOUS

## 2014-05-23 MED ORDER — PERFLUTREN LIPID MICROSPHERE
1.0000 mL | INTRAVENOUS | Status: AC | PRN
  Administered 2014-05-23: 4 mL via INTRAVENOUS
  Filled 2014-05-23: qty 10

## 2014-05-23 MED ORDER — IOHEXOL 300 MG/ML  SOLN
150.0000 mL | Freq: Once | INTRAMUSCULAR | Status: AC | PRN
  Administered 2014-05-23: 150 mL via INTRAVENOUS

## 2014-05-23 MED ORDER — ASPIRIN 325 MG PO TABS
ORAL_TABLET | ORAL | Status: AC
  Filled 2014-05-23: qty 2

## 2014-05-23 MED ORDER — LIDOCAINE HCL (CARDIAC) 20 MG/ML IV SOLN
INTRAVENOUS | Status: DC | PRN
  Administered 2014-05-23: 100 mg via INTRAVENOUS

## 2014-05-23 MED ORDER — CEFAZOLIN SODIUM-DEXTROSE 2-3 GM-% IV SOLR
INTRAVENOUS | Status: AC
  Administered 2014-05-23: 2 g via INTRAVENOUS
  Filled 2014-05-23: qty 50

## 2014-05-23 MED ORDER — CLOPIDOGREL BISULFATE 75 MG PO TABS
ORAL_TABLET | ORAL | Status: AC
  Filled 2014-05-23: qty 4

## 2014-05-23 MED ORDER — ATROPINE SULFATE 1 MG/ML IJ SOLN
INTRAMUSCULAR | Status: DC | PRN
  Administered 2014-05-23 (×2): 0.2 mg via INTRAVENOUS
  Administered 2014-05-23: .1 mg via INTRAVENOUS

## 2014-05-23 MED ORDER — ASPIRIN 325 MG PO TABS
ORAL_TABLET | ORAL | Status: DC | PRN
  Administered 2014-05-23: 650 mg via NASOGASTRIC

## 2014-05-23 MED ORDER — ROCURONIUM BROMIDE 100 MG/10ML IV SOLN
INTRAVENOUS | Status: DC | PRN
  Administered 2014-05-23: 50 mg via INTRAVENOUS

## 2014-05-23 MED ORDER — ACETAMINOPHEN 500 MG PO TABS: 1000.0000 mg | ORAL_TABLET | Freq: Four times a day (QID) | ORAL | Status: DC | PRN

## 2014-05-23 MED ORDER — ACETAMINOPHEN 650 MG RE SUPP: 650.0000 mg | Freq: Four times a day (QID) | RECTAL | Status: DC | PRN

## 2014-05-23 MED ORDER — NOREPINEPHRINE BITARTRATE 1 MG/ML IV SOLN
2.0000 ug/min | INTRAVENOUS | Status: DC
  Administered 2014-05-23: 10 ug/min via INTRAVENOUS
  Administered 2014-05-24: 6 ug/min via INTRAVENOUS
  Filled 2014-05-23 (×3): qty 4

## 2014-05-23 MED ORDER — PROPOFOL 10 MG/ML IV EMUL
5.0000 ug/kg/min | INTRAVENOUS | Status: DC
  Administered 2014-05-23: 35 ug/kg/min via INTRAVENOUS
  Administered 2014-05-23 (×2): 25 ug/kg/min via INTRAVENOUS
  Administered 2014-05-24: 10 ug/kg/min via INTRAVENOUS
  Administered 2014-05-24: 15 ug/kg/min via INTRAVENOUS
  Administered 2014-05-24: 35 ug/kg/min via INTRAVENOUS
  Filled 2014-05-23 (×7): qty 100

## 2014-05-23 MED ORDER — STROKE: EARLY STAGES OF RECOVERY BOOK
Freq: Once | Status: DC
  Filled 2014-05-23: qty 1

## 2014-05-23 MED ORDER — NITROGLYCERIN 1 MG/10 ML FOR IR/CATH LAB
INTRA_ARTERIAL | Status: AC
  Filled 2014-05-23: qty 10

## 2014-05-23 MED ORDER — PHENYLEPHRINE HCL 10 MG/ML IJ SOLN
INTRAMUSCULAR | Status: DC | PRN
  Administered 2014-05-23 (×2): 80 ug via INTRAVENOUS

## 2014-05-23 MED ORDER — CETYLPYRIDINIUM CHLORIDE 0.05 % MT LIQD
7.0000 mL | Freq: Four times a day (QID) | OROMUCOSAL | Status: DC
  Administered 2014-05-23 – 2014-05-26 (×11): 7 mL via OROMUCOSAL

## 2014-05-23 MED ORDER — CLOPIDOGREL BISULFATE 75 MG PO TABS
ORAL_TABLET | ORAL | Status: DC | PRN
  Administered 2014-05-23: 300 mg via NASOGASTRIC
  Administered 2014-05-24: 75 mg

## 2014-05-23 MED ORDER — PROPOFOL INFUSION 10 MG/ML OPTIME
INTRAVENOUS | Status: DC | PRN
  Administered 2014-05-23: 50 ug/kg/min via INTRAVENOUS

## 2014-05-23 MED ORDER — SODIUM CHLORIDE 0.9 % IV SOLN
INTRAVENOUS | Status: DC
  Administered 2014-05-23: 1000 mL via INTRAVENOUS
  Administered 2014-05-23: 11:00:00 via INTRAVENOUS

## 2014-05-23 MED ORDER — ALTEPLASE 100 MG IV SOLR
INTRAVENOUS | Status: AC
  Filled 2014-05-23: qty 1

## 2014-05-23 MED ORDER — SODIUM CHLORIDE 0.9 % IV SOLN
INTRAVENOUS | Status: DC
  Administered 2014-05-24 – 2014-05-26 (×4): via INTRAVENOUS

## 2014-05-23 MED ORDER — ASPIRIN 325 MG PO TABS
325.0000 mg | ORAL_TABLET | Freq: Every day | ORAL | Status: DC
  Administered 2014-05-24 – 2014-05-25 (×2): 325 mg via ORAL
  Filled 2014-05-23 (×4): qty 1

## 2014-05-23 MED ORDER — ALTEPLASE 30 MG/30 ML FOR INTERV. RAD
INTRA_ARTERIAL | Status: DC | PRN
  Administered 2014-05-23 (×2): 2 mg via INTRA_ARTERIAL
  Administered 2014-05-23: 3 mg via INTRA_ARTERIAL
  Administered 2014-05-23 (×2): 4 mg via INTRA_ARTERIAL

## 2014-05-23 MED ORDER — PHENYLEPHRINE HCL 10 MG/ML IJ SOLN
10.0000 mg | INTRAVENOUS | Status: DC | PRN
  Administered 2014-05-23: 60 ug/min via INTRAVENOUS

## 2014-05-23 MED ORDER — CHLORHEXIDINE GLUCONATE 0.12 % MT SOLN
15.0000 mL | Freq: Two times a day (BID) | OROMUCOSAL | Status: DC
  Administered 2014-05-23 – 2014-05-25 (×5): 15 mL via OROMUCOSAL
  Filled 2014-05-23 (×6): qty 15

## 2014-05-23 MED ORDER — IOHEXOL 350 MG/ML SOLN
100.0000 mL | Freq: Once | INTRAVENOUS | Status: AC | PRN
  Administered 2014-05-23: 100 mL via INTRAVENOUS

## 2014-05-23 MED ORDER — SODIUM CHLORIDE 0.9 % IV SOLN
INTRAVENOUS | Status: DC | PRN
  Administered 2014-05-23 (×3): via INTRAVENOUS

## 2014-05-23 NOTE — ED Provider Notes (Signed)
CSN: [id number]     Arrival date & time 05/23/14  0127 History   First MD Initiated Contact with Patient 05/23/14 0141     Chief Complaint  Patient presents with  . Loss of Consciousness    An emergency department physician performed an initial assessment on this suspected stroke patient at 29. (Consider location/radiation/quality/duration/timing/severity/associated sxs/prior Treatment) HPI Patient transferred from [facility] emergency department for code stroke. Patient is nonverbal and unable to contribute history. Level V caveat applies. Per previous records patient last seen normal at 9 PM. Was found by family at 1 AM with the patient was nonverbal. In the emergency department patient had right facial droop with right upper extremity weakness. CT angiogram found left internal carotid occlusion. Dr. Janann Colonel at bedside. Past Medical History  Diagnosis Date  . Hypertension   . GERD (gastroesophageal reflux disease)   . Hypercholesteremia   . HOH (hard of hearing)    Past Surgical History  Procedure Laterality Date  . Shoulder surgery Right     torn cartiledge  . Esophagogastroduodenoscopy  10/28/2010    Procedure: ESOPHAGOGASTRODUODENOSCOPY (EGD);  Surgeon: [name], MD;  [facility];  Service: Endoscopy;  Laterality: N/A;  10:30  . Colonoscopy    . Colonoscopy  05/17/2011    Procedure: COLONOSCOPY;  Surgeon: [name], MD;  [facility];  Service: Endoscopy;  Laterality: N/A;  830  . Resection distal clavical Right 12/13/2012    Procedure: RESECTION DISTAL CLAVICAL;  Surgeon: Carole Civil, MD;  Location: [facility];  Service: Orthopedics;  Laterality: Right;  . Shoulder arthroscopy with bicepstenotomy Right 12/13/2012    Procedure: SHOULDER ARTHROSCOPY WITH BICEPS TENOTOMY, limited shoulder debridement;  Surgeon: Carole Civil, MD;  Location: [facility];  Service: Orthopedics;  Laterality: Right;  . Esophagogastroduodenoscopy N/A 04/24/2014     Procedure: ESOPHAGOGASTRODUODENOSCOPY (EGD);  Surgeon: [name], MD;  [facility];  Service: Endoscopy;  Laterality: N/A;  71   Family History  Problem Relation Age of Onset  . Colon cancer Neg Hx    History  Substance Use Topics  . Smoking status: Former Smoker -- 0.50 packs/day for 35 years    Types: Cigarettes    Quit date: 12/09/1968  . Smokeless tobacco: Not on file  . Alcohol Use: 1.8 oz/week    3 Glasses of wine per week    Review of Systems  Unable to perform ROS     Allergies  Review of patient's allergies indicates no known allergies.  Home Medications   Prior to Admission medications   Medication Sig Start Date End Date Taking? Authorizing Provider  atorvastatin (LIPITOR) 40 MG tablet Take 40 mg by mouth every evening.     Historical Provider, MD  cholecalciferol (VITAMIN D) 1000 UNITS tablet Take 1,000 Units by mouth daily.    Historical Provider, MD  esomeprazole (NEXIUM) 40 MG capsule Take 40 mg by mouth daily.    Historical Provider, MD  fish oil-omega-3 fatty acids 1000 MG capsule Take 1 g by mouth daily.    Historical Provider, MD  hydrochlorothiazide (HYDRODIURIL) 25 MG tablet Take 25 mg by mouth daily.    Historical Provider, MD  HYDROcodone-acetaminophen (NORCO) 7.5-325 MG per tablet Take 1 tablet by mouth every 4 (four) hours as needed for pain. Patient not taking: Reported on 04/13/2014 12/13/12   Carole Civil, MD  methocarbamol (ROBAXIN) 500 MG tablet Take 1 tablet (500 mg total) by mouth 3 (three) times daily.  Patient not taking: Reported on 12/29/2013 12/13/12   Carole Civil, MD  promethazine (PHENERGAN) 12.5 MG tablet Take 1 tablet (12.5 mg total) by mouth every 6 (six) hours as needed for nausea. Patient not taking: Reported on 12/29/2013 12/13/12   Carole Civil, MD  telmisartan (MICARDIS) 80 MG tablet Take 80 mg by mouth daily.    Historical Provider, MD   BP 171/70 mmHg  Pulse 96  Temp(Src) 98.3 F (36.8 C)  (Rectal)  Resp 18  Ht 5\' 10"  (1.778 m)  Wt 212 lb (96.163 kg)  BMI 30.42 kg/m2  SpO2 97% Physical Exam  Constitutional: He appears well-developed and well-nourished. No distress.  Somnolent  HENT:  Head: Normocephalic and atraumatic.  Eyes: EOM are normal. Pupils are equal, round, and reactive to light.  Pupils 2 mm bilaterally  Neck: Normal range of motion. Neck supple.  Cardiovascular: Normal rate and regular rhythm.   Pulmonary/Chest: Effort normal and breath sounds normal. No respiratory distress. He has no wheezes. He has no rales.  Abdominal: Soft. Bowel sounds are normal. He exhibits no distension and no mass. There is no tenderness. There is no rebound and no guarding.  Musculoskeletal: Normal range of motion. He exhibits no edema or tenderness.  Neurological:  Right lower facial droop. Right upper extremity weakness against gravity compared to left. Right lower extremity weakness against gravity compared to left. Slurred speech  Skin: Skin is warm and dry. No rash noted. No erythema.  Nursing note and vitals reviewed.   ED Course  Procedures (including critical care time) Labs Review Labs Reviewed  COMPREHENSIVE METABOLIC PANEL - Abnormal; Notable for the following:    Glucose, Bld 137 (*)    GFR calc non Af Amer 80 (*)    All other components within normal limits  ETHANOL  PROTIME-INR  APTT  CBC  DIFFERENTIAL  TROPONIN I  URINE RAPID DRUG SCREEN (HOSP PERFORMED)  URINALYSIS, ROUTINE W REFLEX MICROSCOPIC    Imaging Review Ct Angio Head W/cm &/or Wo Cm  05/23/2014   CLINICAL DATA:  Initial evaluation for acute syncope, right-sided weakness.  EXAM: CT ANGIOGRAPHY HEAD  TECHNIQUE: Multidetector CT imaging of the head was performed using the standard protocol during bolus administration of intravenous contrast. Multiplanar CT image reconstructions and MIPs were obtained to evaluate the vascular anatomy.  CONTRAST:  170mL OMNIPAQUE IOHEXOL 350 MG/ML SOLN  COMPARISON:   Prior CT from earlier the same day.  FINDINGS: CTA HEAD  Study is degraded by motion artifact.  Anterior circulation: Visualized portion of the distal segment of the right internal carotid artery is widely patent. The petrous, cavernous and supraclinoid segments of the right ICA are widely patent. Minimal calcified plaque present within the right cavernous segment without significant stenosis.  The left internal carotid artery is not visualized within the distal neck, likely occluded proximally. The left ICA remains occluded at its petrous and cavernous segments. There is some distal reconstitution at the supraclinoid segment, likely via contralateral flow cross the circle of Willis. The A1 segments and anterior communicating artery are widely patent. A anterior cerebral arteries are patent bilaterally, although poorly evaluated distally due to motion artifact.  There is somewhat thready an irregular flow within the left M1 segment, which may in part be related to motion artifact. There is question of superimposed focal moderate to severe stenosis within the distal left M1/ proximal left M2 segment at the base of the sylvian fissure. The distal left MCA branches are opacified.  Right  M1 segment and its distal branches are opacified.  Posterior circulation: Right vertebral artery is dominant and widely patent to the vertebrobasilar junction. The somewhat diminutive left vertebral artery patent as well. Posterior inferior cerebral arteries are patent bilaterally. Basilar artery is widely patent. Superior cerebellar arteries patent proximally. Posterior cerebral arteries are opacified bilaterally without high-grade stenosis or occlusion. A small left posterior communicating artery is present.  Venous sinuses: No abnormality identified within the venous sinuses.  Anatomic variants: No anatomic variant.  Delayed phase:No abnormal enhancement on delayed sequences. Atrophy with chronic microvascular ischemic disease noted.   IMPRESSION: 1. Nonvisualization of the left internal carotid artery, occluded proximally within the neck. There is distal reconstitution at the supraclinoid segment of the left ICA via contralateral flow across the circle of Willis. Flow within the left M1 segment is present but somewhat thready and irregular in appearance, which may in part be related to motion artifact on this exam. There is question of a superimposed focal moderate to severe stenosis and/or partially occlusive thrombus within the distal left M1/ proximal M2 segment near the base of the sylvian fissure. CTA imaging of the neck is suggested to evaluate the extent of the left ICA occlusion. 2. Widely patent right internal carotid artery system. 3. Widely patent vertebrobasilar system. A small left posterior communicating artery is present. Critical Value/emergent results were called by telephone at the time of interpretation on 05/23/2014 at 3:51 am to Dr. Rolland Porter , who verbally acknowledged these results.   Electronically Signed   By: Jeannine Boga M.D.   On: 05/23/2014 03:58   Ct Head Wo Contrast  05/23/2014   CLINICAL DATA:  New right-sided weakness.  Unresponsive.  A aphasia.  EXAM: CT HEAD WITHOUT CONTRAST  TECHNIQUE: Contiguous axial images were obtained from the base of the skull through the vertex without intravenous contrast.  COMPARISON:  CT head without contrast 12/29/2013.  FINDINGS: Atrophy and extensive subcortical white matter disease is not significantly changed. No acute cortical infarct is present. The basal ganglia are intact. The ventricles are proportionate to the degree of atrophy.  Paranasal sinuses and mastoid air cells are clear. The calvarium is intact. No significant extra-axial fluid collection is present.  IMPRESSION: 1. No acute intracranial abnormality or significant interval change. 2. Stable atrophy and diffuse white matter disease. This likely reflects the sequela of chronic microvascular ischemia. These  results were called by telephone at the time of interpretation on 05/23/2014 at 2:06 am to Dr. Rolland Porter , who verbally acknowledged these results.   Electronically Signed   By: San Morelle M.D.   On: 05/23/2014 02:07   Dg Epidurography  05/22/2014   CLINICAL DATA:  Lumbosacral spondylosis without myelopathy. LEFT leg radicular symptoms.  DDD (degenerative disc disease), lumbar M51.36 (ICD-10-CM)  Subsequent encounter.  EXAM: LUMBAR INTERLAMINAR EPIDURAL INJECTION  FLUOROSCOPY TIME:  8 seconds corresponding to a Dose Area Product of 20 Gy*m2  PROCEDURE: Procedure: Informed written consent was obtained. Time-out was performed.  We discussed the moderate likelihood of moderate lasting relief/attainment of therapeutic goal. The overlying skin was cleansed with betadine soap and anesthetized with 1% lidocaine without epinephrine. An interlaminar approach was performed at L4-5 on the LEFT. 20 gauge needle was advanced using loss-of-resistance technique.  DIAGNOSTIC/ THERAPEUTIC EPIDURAL INJECTION: Injection of Omnipaque 180 shows a good epidural pattern with spread above and below the level of needle placement, primarily on the side of needle placement. No vascular or subarachnoid opacification was seen. 120 mg of Depo-Medrol  mixed with 5 cc of 1% Lidocaine were instilled. The procedure was well-tolerated, and the patient was discharged thirty minutes following the injection in good condition.  IMPRESSION: Technically successful LEFT L4-5 lumbar interlaminar epidural injection.   Electronically Signed   By: Rolla Flatten M.D.   On: 05/22/2014 12:22     EKG Interpretation   Date/Time:  Saturday May 23 2014 01:31:37 EDT Ventricular Rate:  87 PR Interval:  170 QRS Duration: 93 QT Interval:  376 QTC Calculation: 452 R Axis:   -7 Text Interpretation:  Sinus rhythm LOW VOLTAGE Precordial leads Otherwise  within normal limits No significant change since last tracing 29 Dec 2013  Confirmed by [facility]   MD-I, IVA ([id number]) on 05/23/2014 1:42:41 AM      MDM   Final diagnoses:  Acute right-sided weakness  Expressive aphasia    Dr. Janann Colonel at bedside. Discussed with interventional radiologist. Burnis Medin get CT perfusion study.    Julianne Rice, MD 05/23/14 [phone]

## 2014-05-23 NOTE — ED Notes (Signed)
Pt in by [facility] with c/o syncopal episode at home.  Per ems, pt has not responded verbally to their questions, but obeys all commands.  Pt arrives awake with eyes open, but appears sleepy.

## 2014-05-23 NOTE — Progress Notes (Signed)
INITIAL NUTRITION ASSESSMENT  DOCUMENTATION CODES Per approved criteria  -Obesity Unspecified   INTERVENTION:  If unable to extubate within next 24 hours, recommend initiate TF via OGT with Vital High Protein at 10 ml/h, goal rate with Prostat 60 ml QID to provide 1040 kcals, 141 gm protein, 201 ml free water daily.  Above TF regimen plus current calories from Propofol will provide a total of 1491 kcals (72% of estimated needs).  Once extubated, recommend swallow evaluation before advancing PO diet.  NUTRITION DIAGNOSIS: Inadequate oral intake related to inability to eat as evidenced by NPO status.   Goal: Enteral nutrition to provide 65-70% of estimated calorie needs based on ASPEN guidelines for hypocaloric, high protein feeding in critically ill obese individuals.  Monitor:  TF tolerance/adequacy, weight trend, labs, vent status.  Reason for Assessment: VDRF  80 y.o. male  Admitting Dx: Occluded L ICA  ASSESSMENT: Patient presented to [facility] ED with aphasia, syncope, and right sided weakness. S/P angioplasty to acutely occluded L ICA and stent to L MCA.  Spoke with RN about patient. He will likely be extubated tomorrow. Nutrition focused physical exam completed.  No muscle or subcutaneous fat depletion noticed.  Patient is currently intubated on ventilator support MV: 12 L/min Temp (24hrs), Avg:97.8 F (36.6 C), Min:96.3 F (35.7 C), Max:98.3 F (36.8 C)  Propofol: 17.1 ml/hr providing 451 kcals/day.  Height: Ht Readings from Last 1 Encounters:  05/23/14 5\' 10"  (1.778 m)    Weight: Wt Readings from Last 1 Encounters:  05/23/14 224 lb 3.3 oz (101.7 kg)    Ideal Body Weight: 75.5 kg  % Ideal Body Weight: 135%  Wt Readings from Last 10 Encounters:  05/23/14 224 lb 3.3 oz (101.7 kg)  12/29/13 230 lb (104.327 kg)  03/20/13 233 lb (105.688 kg)  02/11/13 233 lb (105.688 kg)  12/24/12 233 lb (105.688 kg)  12/16/12 233 lb (105.688 kg)  12/03/12 232 lb (105.235  kg)  11/14/12 232 lb (105.235 kg)  05/17/11 236 lb (107.049 kg)  05/10/11 236 lb 14.4 oz (107.457 kg)    Usual Body Weight: 230 lbs  % Usual Body Weight: 97%  BMI:  Body mass index is 32.17 kg/(m^2). class 1 obesity  Estimated Nutritional Needs: Kcal: 2056 Protein: > 150 gm Fluid: 2-2.2 L  Skin: WDL  Diet Order: Diet NPO time specified  EDUCATION NEEDS: -Education not appropriate at this time   Intake/Output Summary (Last 24 hours) at 05/23/14 1218 Last data filed at 05/23/14 1100  Gross per 24 hour  Intake 2053.3 ml  Output    900 ml  Net 1153.3 ml    Last BM: unknown   Labs:   Recent Labs Lab 05/23/14 0158  NA 138  K 4.3  CL 103  CO2 24  BUN 19  CREATININE 0.91  CALCIUM 9.0  GLUCOSE 137*    CBG (last 3)   Recent Labs  05/23/14 0545  GLUCAP 135*    Scheduled Meds: .  stroke: mapping our early stages of recovery book   Does not apply Once  . antiseptic oral rinse  7 mL Mouth Rinse QID  . aspirin      . [START ON 05/24/2014] aspirin  325 mg Oral Q breakfast  . chlorhexidine  15 mL Mouth Rinse BID  . clopidogrel      . [START ON 05/24/2014] clopidogrel  75 mg Oral Q breakfast  . lidocaine      . nitroGLYCERIN      . pantoprazole (PROTONIX)  IV  40 mg Intravenous Daily    Continuous Infusions: . sodium chloride 50 mL/hr at 05/23/14 1122  . sodium chloride    . niCARDipine    . propofol 35 mcg/kg/min (05/23/14 1100)    Past Medical History  Diagnosis Date  . Hypertension   . GERD (gastroesophageal reflux disease)   . Hypercholesteremia   . HOH (hard of hearing)     Past Surgical History  Procedure Laterality Date  . Shoulder surgery Right     torn cartiledge  . Esophagogastroduodenoscopy  10/28/2010    Procedure: ESOPHAGOGASTRODUODENOSCOPY (EGD);  Surgeon: [name], MD;  [facility];  Service: Endoscopy;  Laterality: N/A;  10:30  . Colonoscopy    . Colonoscopy  05/17/2011    Procedure: COLONOSCOPY;  Surgeon:  [name], MD;  [facility];  Service: Endoscopy;  Laterality: N/A;  830  . Resection distal clavical Right 12/13/2012    Procedure: RESECTION DISTAL CLAVICAL;  Surgeon: Carole Civil, MD;  Location: [facility];  Service: Orthopedics;  Laterality: Right;  . Shoulder arthroscopy with bicepstenotomy Right 12/13/2012    Procedure: SHOULDER ARTHROSCOPY WITH BICEPS TENOTOMY, limited shoulder debridement;  Surgeon: Carole Civil, MD;  Location: [facility];  Service: Orthopedics;  Laterality: Right;  . Esophagogastroduodenoscopy N/A 04/24/2014    Procedure: ESOPHAGOGASTRODUODENOSCOPY (EGD);  Surgeon: [name], MD;  [facility];  Service: Endoscopy;  Laterality: N/A;  [place], [place], [place], [place] Pager [phone] After Hours Pager [phone]

## 2014-05-23 NOTE — Anesthesia Postprocedure Evaluation (Signed)
  Anesthesia Post-op Note  Patient: Henry [name]  Procedure(s) Performed: Procedure(s): RADIOLOGY WITH ANESTHESIA (N/A)  Patient Location: ICU  Anesthesia Type:General  Level of Consciousness: sedated, unresponsive and Patient remains intubated per anesthesia plan  Airway and Oxygen Therapy: Patient remains intubated per anesthesia plan  Post-op Pain: unable to evaluate due to sedation  Post-op Assessment: Post-op Vital signs reviewed, Patient's Cardiovascular Status Stable and Respiratory Function Stable  Post-op Vital Signs: Reviewed and stable  Last Vitals:  Filed Vitals:   05/23/14 0615  BP: 149/72  Pulse: 95  Temp:   Resp:     Complications: No apparent anesthesia complications

## 2014-05-23 NOTE — Procedures (Signed)
S/P lt common carotid arteriogram,followed by complete revascularization of acutely occluded Lt ICA prox with stent assisted angioplasty and stenting,and of Lt MCA with x1 pass with the Solitaire 42mm x 40 mm stent retrieval device.TICI 3 revascularization

## 2014-05-23 NOTE — Progress Notes (Signed)
BP has been low >> likely from sedation.  Not much improvement after fluid bolus.  Will add levophed to keep SBP 120 to 140 as per neuro-IR recommendations.  Chesley Mires, MD [facility] Pulmonary/Critical Care 05/23/2014, 2:52 PM Pager:  [phone] After 3pm call: [phone]

## 2014-05-23 NOTE — Anesthesia Preprocedure Evaluation (Addendum)
Anesthesia Evaluation  Patient identified by MRN, date of birth, ID band Patient confused    Reviewed: Allergy & Precautions, Patient's Chart, lab work & pertinent test results, Unable to perform ROS - Chart review only  Airway Mallampati: II  TM Distance: >3 FB     Dental  (+) Teeth Intact   Pulmonary former smoker,  breath sounds clear to auscultation        Cardiovascular hypertension, Pt. on medications Rhythm:Regular Rate:Normal     Neuro/Psych    GI/Hepatic   Endo/Other    Renal/GU      Musculoskeletal   Abdominal   Peds  Hematology   Anesthesia Other Findings   Reproductive/Obstetrics                            Anesthesia Physical Anesthesia Plan  ASA: IV and emergent  Anesthesia Plan: General   Post-op Pain Management:    Induction: Intravenous  Airway Management Planned: Oral ETT  Additional Equipment: Arterial line  Intra-op Plan:   Post-operative Plan:   Informed Consent: I have reviewed the patients History and Physical, chart, labs and discussed the procedure including the risks, benefits and alternatives for the proposed anesthesia with the patient or authorized representative who has indicated his/her understanding and acceptance.     Plan Discussed with: CRNA and Anesthesiologist  Anesthesia Plan Comments: (Acute L. MCA occlusion with L. Brian stroke Htn GERD Former smoker   Plan GA with oral ETT and art line)        Anesthesia Quick Evaluation

## 2014-05-23 NOTE — ED Notes (Signed)
Family at bedside. 

## 2014-05-23 NOTE — ED Notes (Signed)
Pt transported to IR by Elaina Pattee, RN

## 2014-05-23 NOTE — Progress Notes (Signed)
  Echocardiogram 2D Echocardiogram with Definity has been performed.  Diamond Nickel 05/23/2014, 2:39 PM

## 2014-05-23 NOTE — Sedation Documentation (Signed)
Family updated as to patient's status.

## 2014-05-23 NOTE — ED Notes (Signed)
Patient transported to IR 

## 2014-05-23 NOTE — Progress Notes (Signed)
Pt's SBP low 90s- sedation reduced- pt biting tongue and moving L side vigorously. CCM MD notified. Liter bolus of NS started per order. Will cont to monitor.

## 2014-05-23 NOTE — Consult Note (Signed)
Stroke Consult    Chief Complaint: syncope and right sided weakness HPI: Henry [name] is an 80 y.o. male hx of HTN, HLD presenting to [facility] with syncopal episode. Upon arrival found to be aphasic with right sided weakness. LSW 2100. Outside IV tPA window by time of arrival. A CT angiogram was ordered and the patient was transferred to [facility] for possible IR intervention. Upon arrival patient unable to provide further history and no family present. CT head and CT angiogram imaging reviewed with neuro-radiology. Findings pertinent for nonvisualization of the left ICA, occluded proximally within the neck. Case discussed with neuro-IR, Dr Estanislado Pandy, will proceed with perfusion study.   Initial NIHSS of 15 (though likely would be higher as unable to fully participate in testing).   Date last known well: 05/22/2014 Time last known well: 2100 tPA Given: no, outside IV tPA window.  Past Medical History  Diagnosis Date  . Hypertension   . GERD (gastroesophageal reflux disease)   . Hypercholesteremia   . HOH (hard of hearing)     Past Surgical History  Procedure Laterality Date  . Shoulder surgery Right     torn cartiledge  . Esophagogastroduodenoscopy  10/28/2010    Procedure: ESOPHAGOGASTRODUODENOSCOPY (EGD);  Surgeon: [name], MD;  Location: [facility];  Service: Endoscopy;  Laterality: N/A;  10:30  . Colonoscopy    . Colonoscopy  05/17/2011    Procedure: COLONOSCOPY;  Surgeon: [name], MD;  Location: [facility];  Service: Endoscopy;  Laterality: N/A;  830  . Resection distal clavical Right 12/13/2012    Procedure: RESECTION DISTAL CLAVICAL;  Surgeon: Carole Civil, MD;  Location: [facility] ORS;  Service: Orthopedics;  Laterality: Right;  . Shoulder arthroscopy with bicepstenotomy Right 12/13/2012    Procedure: SHOULDER ARTHROSCOPY WITH BICEPS TENOTOMY, limited shoulder debridement;  Surgeon: Carole Civil, MD;  Location: [facility];  Service: Orthopedics;  Laterality:  Right;  . Esophagogastroduodenoscopy N/A 04/24/2014    Procedure: ESOPHAGOGASTRODUODENOSCOPY (EGD);  Surgeon: [name], MD;  Location: [facility];  Service: Endoscopy;  Laterality: N/A;  25    Family History  Problem Relation Age of Onset  . Colon cancer Neg Hx    Social History:  reports that he quit smoking about 45 years ago. His smoking use included Cigarettes. He has a 17.5 pack-year smoking history. He does not have any smokeless tobacco history on file. He reports that he drinks about 1.8 oz of alcohol per week. He reports that he does not use illicit drugs.  Allergies: No Known Allergies   (Not in a hospital admission)  ROS: Out of a complete 14 system review, the patient complains of only the following symptoms, and all other reviewed systems are negative. Unable to obtain  Physical Examination: Filed Vitals:   05/23/14 0405  BP:   Pulse:   Temp: 98.3 F (36.8 C)  Resp:    Physical Exam  Constitutional: He appears well-developed and well-nourished.  Psych: Affect appropriate to situation Eyes: No scleral injection HENT: No OP obstrucion Head: Normocephalic.  Cardiovascular: Normal rate and regular rhythm.  Respiratory: Effort normal and breath sounds normal.  GI: Soft. Bowel sounds are normal. No distension. There is no tenderness.  Skin: WDI  Neurologic Examination: Mental Status: Lethargic but aroused with voice. Intermittently following commands though not consistent. Marked expressive aphasia. Mild to moderate dysarthria.  Cranial Nerves: II: optic discs not visualized, decreased blink to threat on right side, pupils equal, round, reactive to  light III,IV, VI: ptosis not present, eyes midline, will look to the left, unable to get past midline to the right V,VII: right facial weakness, unable to test facial sensation VIII: hearing normal bilaterally IX,X: gag reflex present XI: unable to test XII: unable to test Motor: Unable to formally  test but left side grossly appears to be 5/5 Drift of RUE and RLE. Appears able to lift UE and LE against gravity and minimal resistance Tone and bulk:normal tone throughout; no atrophy noted Sensory: withdrawal to noxious stimuli in all extremities though less briskly on the right Deep Tendon Reflexes: 2+ and symmetric throughout Plantars: Right: downgoing   Left: downgoing Cerebellar: Unable to test due to mental status Gait: unable to test  Laboratory Studies:   Basic Metabolic Panel:  Recent Labs Lab 05/23/14 0158  NA 138  K 4.3  CL 103  CO2 24  GLUCOSE 137*  BUN 19  CREATININE 0.91  CALCIUM 9.0    Liver Function Tests:  Recent Labs Lab 05/23/14 0158  AST 22  ALT 28  ALKPHOS 52  BILITOT 0.6  PROT 7.1  ALBUMIN 3.7   No results for input(s): LIPASE, AMYLASE in the last 168 hours. No results for input(s): AMMONIA in the last 168 hours.  CBC:  Recent Labs Lab 05/23/14 0158  WBC 8.5  NEUTROABS 6.5  HGB 13.9  HCT 42.3  MCV 85.5  PLT 226    Cardiac Enzymes:  Recent Labs Lab 05/23/14 0158  TROPONINI <0.03    BNP: Invalid input(s): POCBNP  CBG: No results for input(s): GLUCAP in the last 168 hours.  Microbiology: Results for orders placed or performed during the hospital encounter of 08/25/06  Culture, blood (routine x 2)     Status: None   Collection Time: 08/25/06  9:30 AM  Result Value Ref Range Status   Specimen Description BLOOD LEFT ARM  Final   Special Requests   Final    BOTTLES DRAWN AEROBIC AND ANAEROBIC [place]   Culture NO GROWTH 5 DAYS  Final   Report Status [id number] FINAL  Final    Coagulation Studies:  Recent Labs  05/23/14 0158  LABPROT 13.9  INR 1.06    Urinalysis: No results for input(s): COLORURINE, LABSPEC, PHURINE, GLUCOSEU, HGBUR, BILIRUBINUR, KETONESUR, PROTEINUR, UROBILINOGEN, NITRITE, LEUKOCYTESUR in the last 168 hours.  Invalid input(s): APPERANCEUR  Lipid Panel:  No results found for: CHOL,  TRIG, HDL, CHOLHDL, VLDL, LDLCALC  HgbA1C: No results found for: HGBA1C  Urine Drug Screen:  No results found for: LABOPIA, COCAINSCRNUR, LABBENZ, AMPHETMU, THCU, LABBARB  Alcohol Level:  Recent Labs Lab 05/23/14 0158  ETH <5    Other results:  Imaging: Ct Angio Head W/cm &/or Wo Cm  05/23/2014   CLINICAL DATA:  Initial evaluation for acute syncope, right-sided weakness.  EXAM: CT ANGIOGRAPHY HEAD  TECHNIQUE: Multidetector CT imaging of the head was performed using the standard protocol during bolus administration of intravenous contrast. Multiplanar CT image reconstructions and MIPs were obtained to evaluate the vascular anatomy.  CONTRAST:  196mL OMNIPAQUE IOHEXOL 350 MG/ML SOLN  COMPARISON:  Prior CT from earlier the same day.  FINDINGS: CTA HEAD  Study is degraded by motion artifact.  Anterior circulation: Visualized portion of the distal segment of the right internal carotid artery is widely patent. The petrous, cavernous and supraclinoid segments of the right ICA are widely patent. Minimal calcified plaque present within the right cavernous segment without significant stenosis.  The left internal carotid artery is not  visualized within the distal neck, likely occluded proximally. The left ICA remains occluded at its petrous and cavernous segments. There is some distal reconstitution at the supraclinoid segment, likely via contralateral flow cross the circle of Willis. The A1 segments and anterior communicating artery are widely patent. A anterior cerebral arteries are patent bilaterally, although poorly evaluated distally due to motion artifact.  There is somewhat thready an irregular flow within the left M1 segment, which may in part be related to motion artifact. There is question of superimposed focal moderate to severe stenosis within the distal left M1/ proximal left M2 segment at the base of the sylvian fissure. The distal left MCA branches are opacified.  Right M1 segment and its distal  branches are opacified.  Posterior circulation: Right vertebral artery is dominant and widely patent to the vertebrobasilar junction. The somewhat diminutive left vertebral artery patent as well. Posterior inferior cerebral arteries are patent bilaterally. Basilar artery is widely patent. Superior cerebellar arteries patent proximally. Posterior cerebral arteries are opacified bilaterally without high-grade stenosis or occlusion. A small left posterior communicating artery is present.  Venous sinuses: No abnormality identified within the venous sinuses.  Anatomic variants: No anatomic variant.  Delayed phase:No abnormal enhancement on delayed sequences. Atrophy with chronic microvascular ischemic disease noted.  IMPRESSION: 1. Nonvisualization of the left internal carotid artery, occluded proximally within the neck. There is distal reconstitution at the supraclinoid segment of the left ICA via contralateral flow across the circle of Willis. Flow within the left M1 segment is present but somewhat thready and irregular in appearance, which may in part be related to motion artifact on this exam. There is question of a superimposed focal moderate to severe stenosis and/or partially occlusive thrombus within the distal left M1/ proximal M2 segment near the base of the sylvian fissure. CTA imaging of the neck is suggested to evaluate the extent of the left ICA occlusion. 2. Widely patent right internal carotid artery system. 3. Widely patent vertebrobasilar system. A small left posterior communicating artery is present. Critical Value/emergent results were called by telephone at the time of interpretation on 05/23/2014 at 3:51 am to Dr. Rolland Porter , who verbally acknowledged these results.   Electronically Signed   By: Jeannine Boga M.D.   On: 05/23/2014 03:58   Ct Head Wo Contrast  05/23/2014   CLINICAL DATA:  New right-sided weakness.  Unresponsive.  A aphasia.  EXAM: CT HEAD WITHOUT CONTRAST  TECHNIQUE:  Contiguous axial images were obtained from the base of the skull through the vertex without intravenous contrast.  COMPARISON:  CT head without contrast 12/29/2013.  FINDINGS: Atrophy and extensive subcortical white matter disease is not significantly changed. No acute cortical infarct is present. The basal ganglia are intact. The ventricles are proportionate to the degree of atrophy.  Paranasal sinuses and mastoid air cells are clear. The calvarium is intact. No significant extra-axial fluid collection is present.  IMPRESSION: 1. No acute intracranial abnormality or significant interval change. 2. Stable atrophy and diffuse white matter disease. This likely reflects the sequela of chronic microvascular ischemia. These results were called by telephone at the time of interpretation on 05/23/2014 at 2:06 am to Dr. Rolland Porter , who verbally acknowledged these results.   Electronically Signed   By: San Morelle M.D.   On: 05/23/2014 02:07   Dg Epidurography  05/22/2014   CLINICAL DATA:  Lumbosacral spondylosis without myelopathy. LEFT leg radicular symptoms.  DDD (degenerative disc disease), lumbar M51.36 (ICD-10-CM)  Subsequent encounter.  EXAM: LUMBAR INTERLAMINAR EPIDURAL INJECTION  FLUOROSCOPY TIME:  8 seconds corresponding to a Dose Area Product of 20 Gy*m2  PROCEDURE: Procedure: Informed written consent was obtained. Time-out was performed.  We discussed the moderate likelihood of moderate lasting relief/attainment of therapeutic goal. The overlying skin was cleansed with betadine soap and anesthetized with 1% lidocaine without epinephrine. An interlaminar approach was performed at L4-5 on the LEFT. 20 gauge needle was advanced using loss-of-resistance technique.  DIAGNOSTIC/ THERAPEUTIC EPIDURAL INJECTION: Injection of Omnipaque 180 shows a good epidural pattern with spread above and below the level of needle placement, primarily on the side of needle placement. No vascular or subarachnoid opacification  was seen. 120 mg of Depo-Medrol mixed with 5 cc of 1% Lidocaine were instilled. The procedure was well-tolerated, and the patient was discharged thirty minutes following the injection in good condition.  IMPRESSION: Technically successful LEFT L4-5 lumbar interlaminar epidural injection.   Electronically Signed   By: Rolla Flatten M.D.   On: 05/22/2014 12:22    Assessment: 80 y.o. male hx of HTN and HLD presenting with possible syncopal episode found to have profound aphasia and right sided weakness consistent with a L MCA infarct. CT angiogram shows nonvisualization of the left ICA, occluded proximally within the neck. Discussed with Dr Estanislado Pandy, patient sent for CT perfusion study. Based on CT perfusion patient sent for IR intervention.    Plan: 1. HgbA1c, fasting lipid panel 2. MRI, MRA  of the brain without contrast 3. PT consult, OT consult, Speech consult 4. Echocardiogram 5. Carotid dopplers 6. Prophylactic therapy-hold antiplatelet post IR 7. Risk factor modification 8. Telemetry monitoring 9. Frequent neuro checks 10. NPO until RN stroke swallow screen  This patient is critically ill and at significant risk of neurological worsening, death and care requires constant monitoring of vital signs, hemodynamics,respiratory and cardiac monitoring,review of multiple databases, neurological assessment, discussion with family, other specialists and medical decision making of high complexity. I spent 55 inutes of neurocritical care time in the care of this patient.    Jim Like, DO [facility] [phone]  If 7pm- 7am, please page neurology on call as listed in AMION. 05/23/2014, 4:14 AM

## 2014-05-23 NOTE — Progress Notes (Signed)
Patient ID: Henry [name], male   DOB: 10-27-1934, 80 y.o.   MRN: [id number] Post procedural CT reveals no evidence of hemorrhage.. D/W family. P2Y12 113. Will repeat in am Also during the procedure patient was given a total of 18mg  of IA INTEGRELIN Dr Estanislado Pandy

## 2014-05-23 NOTE — Progress Notes (Signed)
Chaplain responded to request from RN to make contact with patient family regarding pending news about stroke.  Met wife Leonia Reader and daughters [name] and Kilkenny in ED waiting room and escorted them to sub-waiting B.  Provided emotional support, orientation and drinks to family.  They were anxious about time-sensitivity because [facility] doc told them that 5 AM was a critical time.  Accompanied doctor who shared initial report about treatment.  They continue to be anxious.  Daughters are switching out visiting while wife remains bedside. Please call as further support is needed.   Luana Shu [phone]     05/23/14 0500  Clinical Encounter Type  Visited With Patient and family together  Visit Type Initial;Psychological support;Critical Care  Referral From Nurse

## 2014-05-23 NOTE — Progress Notes (Signed)
ED nurse Ruthy Dick Called code stroke at 0158 Patient was beginning scanned at [place] Radiologist called at [place],  Dr. Jobe Igo Exam sent to [facility] consult at [place], exam completed and ended at 0205\Code Stroke beeper went off at [facility]

## 2014-05-23 NOTE — Transfer of Care (Addendum)
Immediate Anesthesia Transfer of Care Note  Patient: Henry [name]  Procedure(s) Performed: Procedure(s): RADIOLOGY WITH ANESTHESIA (N/A)  Patient Location:[facility]  Anesthesia Type:General  Level of Consciousness: sedated, unresponsive and Patient remains intubated per anesthesia plan  Airway & Oxygen Therapy: Patient remains intubated per anesthesia plan and Patient placed on Ventilator (see vital sign flow sheet for setting)  Post-op Assessment: Report given to RN and Post -op Vital signs reviewed and stable  Post vital signs: Reviewed and stable  Last Vitals:  Filed Vitals:   05/23/14 0615  BP: 149/72  Pulse: 95  Temp:   Resp:     Complications: No apparent anesthesia complications

## 2014-05-23 NOTE — ED Notes (Signed)
IR informed pt is ready to come for procedure. Floor called and informed pt is going to IR.

## 2014-05-23 NOTE — ED Notes (Signed)
[place] notified

## 2014-05-23 NOTE — ED Provider Notes (Signed)
CSN: [id number]     Arrival date & time 05/23/14  0127 History   First MD Initiated Contact with Patient 05/23/14 0141     Chief Complaint  Patient presents with  . Loss of Consciousness    Level V caveat because patient is nonverbal  @EDPCLEARED @ (Consider location/radiation/quality/duration/timing/severity/associated sxs/prior Treatment) HPI  Family reports they went to bed about 9 PM. Patient was normal at that time. They report he normally goes to bed later at 11 p.m. however he was not seen by his family until about 1 AM when they were awakened when they heard a thump in his room. He was found in his bedroom by the door. He was unable to speak at that time. EMS was called. Patient is awake and follows commands however he does not answer even yes or no questions.  PCP Dr Everette Rank  Past Medical History  Diagnosis Date  . Hypertension   . GERD (gastroesophageal reflux disease)   . Hypercholesteremia   . HOH (hard of hearing)    Past Surgical History  Procedure Laterality Date  . Shoulder surgery Right     torn cartiledge  . Esophagogastroduodenoscopy  10/28/2010    Procedure: ESOPHAGOGASTRODUODENOSCOPY (EGD);  Surgeon: [name], MD;  Location: [facility];  Service: Endoscopy;  Laterality: N/A;  10:30  . Colonoscopy    . Colonoscopy  05/17/2011    Procedure: COLONOSCOPY;  Surgeon: [name], MD;  Location: [facility];  Service: Endoscopy;  Laterality: N/A;  830  . Resection distal clavical Right 12/13/2012    Procedure: RESECTION DISTAL CLAVICAL;  Surgeon: Carole Civil, MD;  Location: [facility];  Service: Orthopedics;  Laterality: Right;  . Shoulder arthroscopy with bicepstenotomy Right 12/13/2012    Procedure: SHOULDER ARTHROSCOPY WITH BICEPS TENOTOMY, limited shoulder debridement;  Surgeon: Carole Civil, MD;  Location: [facility];  Service: Orthopedics;  Laterality: Right;  . Esophagogastroduodenoscopy N/A 04/24/2014    Procedure: ESOPHAGOGASTRODUODENOSCOPY  (EGD);  Surgeon: [name], MD;  Location: [facility];  Service: Endoscopy;  Laterality: N/A;  16   Family History  Problem Relation Age of Onset  . Colon cancer Neg Hx    History  Substance Use Topics  . Smoking status: Former Smoker -- 0.50 packs/day for 35 years    Types: Cigarettes    Quit date: 12/09/1968  . Smokeless tobacco: Not on file  . Alcohol Use: 1.8 oz/week    3 Glasses of wine per week  lives at home   Review of Systems  Unable to perform ROS: Patient nonverbal      Allergies  Review of patient's allergies indicates no known allergies.  Home Medications   Prior to Admission medications   Medication Sig Start Date End Date Taking? Authorizing Provider  atorvastatin (LIPITOR) 40 MG tablet Take 40 mg by mouth every evening.     Historical Provider, MD  cholecalciferol (VITAMIN D) 1000 UNITS tablet Take 1,000 Units by mouth daily.    Historical Provider, MD  esomeprazole (NEXIUM) 40 MG capsule Take 40 mg by mouth daily.    Historical Provider, MD  fish oil-omega-3 fatty acids 1000 MG capsule Take 1 g by mouth daily.    Historical Provider, MD  hydrochlorothiazide (HYDRODIURIL) 25 MG tablet Take 25 mg by mouth daily.    Historical Provider, MD  HYDROcodone-acetaminophen (NORCO) 7.5-325 MG per tablet Take 1 tablet by mouth every 4 (four) hours as needed for pain. Patient not taking: Reported on 04/13/2014 12/13/12  Carole Civil, MD  methocarbamol (ROBAXIN) 500 MG tablet Take 1 tablet (500 mg total) by mouth 3 (three) times daily. Patient not taking: Reported on 12/29/2013 12/13/12   Carole Civil, MD  promethazine (PHENERGAN) 12.5 MG tablet Take 1 tablet (12.5 mg total) by mouth every 6 (six) hours as needed for nausea. Patient not taking: Reported on 12/29/2013 12/13/12   Carole Civil, MD  telmisartan (MICARDIS) 80 MG tablet Take 80 mg by mouth daily.    Historical Provider, MD   BP 177/100 mmHg  Pulse 92  Temp(Src) 98.1 F (36.7 C)  (Oral)  Resp 16  Ht 5\' 10"  (1.778 m)  Wt 212 lb (96.163 kg)  BMI 30.42 kg/m2  SpO2 100%  Vital signs normal except for hypertension  Physical Exam  Constitutional: He appears well-developed and well-nourished.  Non-toxic appearance. He does not appear ill. No distress.  HENT:  Head: Normocephalic and atraumatic.  Right Ear: External ear normal.  Left Ear: External ear normal.  Nose: Nose normal. No mucosal edema or rhinorrhea.  Mouth/Throat: Oropharynx is clear and moist and mucous membranes are normal. No dental abscesses or uvula swelling.  Eyes: Conjunctivae and EOM are normal. Pupils are equal, round, and reactive to light.  Neck: Normal range of motion and full passive range of motion without pain. Neck supple.  Cardiovascular: Normal rate, regular rhythm and normal heart sounds.  Exam reveals no gallop and no friction rub.   No murmur heard. Pulmonary/Chest: Effort normal and breath sounds normal. No respiratory distress. He has no wheezes. He has no rhonchi. He has no rales. He exhibits no tenderness and no crepitus.  Abdominal: Soft. Normal appearance and bowel sounds are normal. He exhibits no distension. There is no tenderness. There is no rebound and no guarding.  Musculoskeletal: Normal range of motion. He exhibits no edema or tenderness.  Moves all extremities well.   Neurological: He is alert. He has normal strength. No cranial nerve deficit.  Patient is noted to have a prominent right facial droop. Patient follows commands however he does not respond verbally. He also is unable to shake his head yes or no to questions. He is unable to grip with his right hand. He has some mild movement of his right upper extremity but he is unable to lift his arm against gravity. His left grip and left upper extremity are normal. Patient has normal range of motion in his right lower leg with good flexion and extension it appears to have some minor weakness in the left lower extremity.  Skin:  Skin is warm, dry and intact. No rash noted. No erythema. No pallor.  Psychiatric: He has a normal mood and affect. His speech is normal and behavior is normal. His mood appears not anxious.  Nursing note and vitals reviewed.   ED Course  Procedures (including critical care time)  01:48CODE STROKE called after my assessment.  NIH score 14  02:06 Dr Jobe Igo, radiology, called CT head results.   02:02 Specialist on call screener, will have neurologist evaluate  02:30 Dr Janann Colonel, Neurology at [facility], wants CT angio of cranial vessels, feels IR candidate until 5 am. States to send to [facility] ED and he will evalute patient there. [facility] doesn't have available truck, [facility] EMS will need to transport.   02:35 Dr Ronnald Ramp, [facility] Neurologist, states outside of tPA window, she states the window for IR is usually 6-8 hours and he would be a candidate.   Pt sent for CT angio  brain  02: 58 Family given update and need to transfer to [facility], in addition no MRI or echocardiogram available here over the weekend.   03:07 Gretta Cool, Camera operator at [facility] ED informed of transfer  03:09 Dr Lita Mains made aware of transfer and that Dr Janann Colonel is going to see patient to decide if he is IR candidate  03:10 recheck, still unable to grip with right hand, has some movement of the RUE but is not able to lift against gravity, right facial droop not as prominent, I did not hear him speak but EMS states he had some verbal grunting  03:50 Radiology called results of his CTA, suggests CTA of neck vessels. Pt has obstruction of his Lt ICA  Labs Review Results for orders placed or performed during the hospital encounter of 05/23/14  Ethanol  Result Value Ref Range   Alcohol, Ethyl (B) <5 0 - 9 mg/dL  Protime-INR  Result Value Ref Range   Prothrombin Time 13.9 11.6 - 15.2 seconds   INR 1.06 0.00 - 1.49  APTT  Result Value Ref Range   aPTT 27 24 - 37 seconds  CBC  Result Value Ref Range   WBC 8.5 4.0 - 10.5 K/uL   RBC 4.95 4.22 -  5.81 MIL/uL   Hemoglobin 13.9 13.0 - 17.0 g/dL   HCT 42.3 39.0 - 52.0 %   MCV 85.5 78.0 - 100.0 fL   MCH 28.1 26.0 - 34.0 pg   MCHC 32.9 30.0 - 36.0 g/dL   RDW 13.1 11.5 - 15.5 %   Platelets 226 150 - 400 K/uL  Differential  Result Value Ref Range   Neutrophils Relative % 77 43 - 77 %   Neutro Abs 6.5 1.7 - 7.7 K/uL   Lymphocytes Relative 18 12 - 46 %   Lymphs Abs 1.5 0.7 - 4.0 K/uL   Monocytes Relative 5 3 - 12 %   Monocytes Absolute 0.4 0.1 - 1.0 K/uL   Eosinophils Relative 0 0 - 5 %   Eosinophils Absolute 0.0 0.0 - 0.7 K/uL   Basophils Relative 0 0 - 1 %   Basophils Absolute 0.0 0.0 - 0.1 K/uL  Comprehensive metabolic panel  Result Value Ref Range   Sodium 138 135 - 145 mmol/L   Potassium 4.3 3.5 - 5.1 mmol/L   Chloride 103 96 - 112 mmol/L   CO2 24 19 - 32 mmol/L   Glucose, Bld 137 (H) 70 - 99 mg/dL   BUN 19 6 - 23 mg/dL   Creatinine, Ser 0.91 0.50 - 1.35 mg/dL   Calcium 9.0 8.4 - 10.5 mg/dL   Total Protein 7.1 6.0 - 8.3 g/dL   Albumin 3.7 3.5 - 5.2 g/dL   AST 22 0 - 37 U/L   ALT 28 0 - 53 U/L   Alkaline Phosphatase 52 39 - 117 U/L   Total Bilirubin 0.6 0.3 - 1.2 mg/dL   GFR calc non Af Amer 80 (L) >90 mL/min   GFR calc Af Amer >90 >90 mL/min   Anion gap 11 5 - 15  Troponin I  Result Value Ref Range   Troponin I <0.03 <0.031 ng/mL    Laboratory interpretation all normal     Imaging Review Ct Head Wo Contrast  05/23/2014   CLINICAL DATA:  New right-sided weakness.  Unresponsive.  A aphasia.  EXAM: CT HEAD WITHOUT CONTRAST  TECHNIQUE: Contiguous axial images were obtained from the base of the skull through the vertex without intravenous contrast.  COMPARISON:  CT head without contrast 12/29/2013.  FINDINGS: Atrophy and extensive subcortical white matter disease is not significantly changed. No acute cortical infarct is present. The basal ganglia are intact. The ventricles are proportionate to the degree of atrophy.  Paranasal sinuses and mastoid air cells are clear.  The calvarium is intact. No significant extra-axial fluid collection is present.  IMPRESSION: 1. No acute intracranial abnormality or significant interval change. 2. Stable atrophy and diffuse white matter disease. This likely reflects the sequela of chronic microvascular ischemia. These results were called by telephone at the time of interpretation on 05/23/2014 at 2:06 am to Dr. Rolland Porter , who verbally acknowledged these results.   Electronically Signed   By: San Morelle M.D.   On: 05/23/2014 02:07   Dg Epidurography  05/22/2014   CLINICAL DATA:  Lumbosacral spondylosis without myelopathy. LEFT leg radicular symptoms.  DDD (degenerative disc disease), lumbar M51.36 (ICD-10-CM)  Subsequent encounter.  EXAM: LUMBAR INTERLAMINAR EPIDURAL INJECTION  FLUOROSCOPY TIME:  8 seconds corresponding to a Dose Area Product of 20 Gy*m2  PROCEDURE: Procedure: Informed written consent was obtained. Time-out was performed.  We discussed the moderate likelihood of moderate lasting relief/attainment of therapeutic goal. The overlying skin was cleansed with betadine soap and anesthetized with 1% lidocaine without epinephrine. An interlaminar approach was performed at L4-5 on the LEFT. 20 gauge needle was advanced using loss-of-resistance technique.  DIAGNOSTIC/ THERAPEUTIC EPIDURAL INJECTION: Injection of Omnipaque 180 shows a good epidural pattern with spread above and below the level of needle placement, primarily on the side of needle placement. No vascular or subarachnoid opacification was seen. 120 mg of Depo-Medrol mixed with 5 cc of 1% Lidocaine were instilled. The procedure was well-tolerated, and the patient was discharged thirty minutes following the injection in good condition.  IMPRESSION: Technically successful LEFT L4-5 lumbar interlaminar epidural injection.   Electronically Signed   By: Rolla Flatten M.D.   On: 05/22/2014 12:22   Dg Lumbar Spine Complete  05/15/2014   CLINICAL DATA:  80 year old male with  bilateral lumbar back pain beginning last night. Microscopic hematuria. Initial encounter.  EXAM: LUMBAR SPINE - COMPLETE 4+ VIEW  COMPARISON:  Lumbar MRI 03/31/2008.  FINDINGS: Normal lumbar segmentation which corresponds to the same numbering system in 2010. Multilevel chronic lumbar disc degeneration with vacuum disc and endplate spurring. Stable vertebral height and alignment since 2010. Visible lower thoracic levels appear intact. There is also vacuum disc at T12-L1. Mild to moderate facet hypertrophy throughout the lumbar spine, relatively sparing the L1-L2 level. No pars fracture. Sacral ala and SI joints within normal limits. Aortoiliac calcified atherosclerosis noted.  IMPRESSION: No acute osseous abnormality identified. In the lumbar spine. Widespread advanced disc and facet degeneration.   Electronically Signed   By: Genevie Ann M.D.   On: 05/15/2014 16:57    Ct Angio Head W/cm &/or Wo Cm  05/23/2014   CLINICAL DATA:  Initial evaluation for acute syncope, right-sided weakness.  EXAM: CT ANGIOGRAPHY HEAD  TECHNIQUE: Multidetector CT imaging of the head was performed using the standard protocol during bolus administration of intravenous contrast. Multiplanar CT image reconstructions and MIPs were obtained to evaluate the vascular anatomy.  CONTRAST:  191mL OMNIPAQUE IOHEXOL 350 MG/ML SOLN  COMPARISON:  Prior CT from earlier the same day.  FINDINGS: CTA HEAD  Study is degraded by motion artifact.  Anterior circulation: Visualized portion of the distal segment of the right internal carotid artery is widely patent. The petrous, cavernous and supraclinoid segments of the  right ICA are widely patent. Minimal calcified plaque present within the right cavernous segment without significant stenosis.  The left internal carotid artery is not visualized within the distal neck, likely occluded proximally. The left ICA remains occluded at its petrous and cavernous segments. There is some distal reconstitution at the  supraclinoid segment, likely via contralateral flow cross the circle of Willis. The A1 segments and anterior communicating artery are widely patent. A anterior cerebral arteries are patent bilaterally, although poorly evaluated distally due to motion artifact.  There is somewhat thready an irregular flow within the left M1 segment, which may in part be related to motion artifact. There is question of superimposed focal moderate to severe stenosis within the distal left M1/ proximal left M2 segment at the base of the sylvian fissure. The distal left MCA branches are opacified.  Right M1 segment and its distal branches are opacified.  Posterior circulation: Right vertebral artery is dominant and widely patent to the vertebrobasilar junction. The somewhat diminutive left vertebral artery patent as well. Posterior inferior cerebral arteries are patent bilaterally. Basilar artery is widely patent. Superior cerebellar arteries patent proximally. Posterior cerebral arteries are opacified bilaterally without high-grade stenosis or occlusion. A small left posterior communicating artery is present.  Venous sinuses: No abnormality identified within the venous sinuses.  Anatomic variants: No anatomic variant.  Delayed phase:No abnormal enhancement on delayed sequences. Atrophy with chronic microvascular ischemic disease noted.  IMPRESSION: 1. Nonvisualization of the left internal carotid artery, occluded proximally within the neck. There is distal reconstitution at the supraclinoid segment of the left ICA via contralateral flow across the circle of Willis. Flow within the left M1 segment is present but somewhat thready and irregular in appearance, which may in part be related to motion artifact on this exam. There is question of a superimposed focal moderate to severe stenosis and/or partially occlusive thrombus within the distal left M1/ proximal M2 segment near the base of the sylvian fissure. CTA imaging of the neck is  suggested to evaluate the extent of the left ICA occlusion. 2. Widely patent right internal carotid artery system. 3. Widely patent vertebrobasilar system. A small left posterior communicating artery is present. Critical Value/emergent results were called by telephone at the time of interpretation on 05/23/2014 at 3:51 am to Dr. Rolland Porter , who verbally acknowledged these results.   Electronically Signed   By: Jeannine Boga M.D.   On: 05/23/2014 03:58       EKG Interpretation   Date/Time:  Saturday May 23 2014 01:31:37 EDT Ventricular Rate:  87 PR Interval:  170 QRS Duration: 93 QT Interval:  376 QTC Calculation: 452 R Axis:   -7 Text Interpretation:  Sinus rhythm LOW VOLTAGE Precordial leads Otherwise  within normal limits No significant change since last tracing 29 Dec 2013  Confirmed by [facility]  MD-I, Ezreal Turay ([id number]) on 05/23/2014 1:42:41 AM      MDM   Final diagnoses:  Acute right-sided weakness  Expressive aphasia   Plan transfer to [facility] ED to be evaluated by Dr Assunta Gambles, MD, [place] Performed by: Rolland Porter L Total critical care time: 52 min Critical care time was exclusive of separately billable procedures and treating other patients. Critical care was necessary to treat or prevent imminent or life-threatening deterioration. Critical care was time spent personally by me on the following activities: development of treatment plan with patient and/or surrogate as well as nursing, discussions with consultants, evaluation of patient's response to treatment, examination  of patient, obtaining history from patient or surrogate, ordering and performing treatments and interventions, ordering and review of laboratory studies, ordering and review of radiographic studies, pulse oximetry and re-evaluation of patient's condition.     Rolland Porter, MD 05/23/14 [phone]

## 2014-05-23 NOTE — Consult Note (Signed)
PULMONARY / CRITICAL CARE MEDICINE   Name: Henry [name] MRN: [id number] DOB: 1934-06-23    ADMISSION DATE:  05/23/2014 CONSULTATION DATE:  05/23/14  REFERRING MD : Neuro   CHIEF COMPLAINT:  Vent Management   INITIAL PRESENTATION:  80 yo admitted with syncope/right sided weakness out of TPA window s/p angioplasty to acutely occluded L ICA  And stent to L MCA   STUDIES:  CT Head 4/9 >No acute intracranial abnormality or significant interval change. 2. Stable atrophy and diffuse white matter disease. This likely reflects the sequela of chronic microvascular ischemia. CTA brain 4/9 >Nonvisualization of the left internal carotid artery, occluded proximally within the neck. There is distal reconstitution at the supraclinoid segment of the left ICA via contralateral flow across the circle of Willis. Flow within the left M1 segment is present but somewhat thready and irregular in appearance, which may in part be related to motion artifact on this exam. There is question of a superimposed focal moderate to severe stenosis and/or partially occlusive thrombus within the distal left M1/ proximal M2 segment near the base of the sylvian fissure. CTA imaging of the neck is suggested to evaluate the extent of the left ICA occlusion. CT cerebral perfusion 4/9 >Area of ischemic core infarct within the left MCA territory, most prevalent within the anterior left frontal lobe MRI 4/9 >>  SIGNIFICANT EVENTS: 4/9 IR >S/P lt common carotid arteriogram,followed by complete revascularization of acutely occluded Lt ICA prox with stent assisted angioplasty and stenting,and of Lt MCA with x1 pass     HISTORY OF PRESENT ILLNESS:   Henry [name] is an 80 y.o. male hx of HTN, HLD presenting to [facility] with syncopal episode. Upon arrival found to be aphasic with right sided weakness. LSW 2100. Outside IV tPA window by time of arrival. A CT angiogram was ordered and the patient was transferred to [facility] for  possible IR intervention. Upon arrival patient unable to provide further history and no family present. CT head and CT angiogram imaging reviewed with neuro-radiology. Findings pertinent for nonvisualization of the left ICA, occluded proximally within the neck. Case discussed with neuro-IR, Dr Estanislado Pandy,   Initial NIHSS of 15 (though likely would be higher as unable to fully participate in testing).  Pt went to IR with Dr. Estanislado Pandy >S/P lt common carotid arteriogram,followed by complete revascularization of acutely occluded Lt ICA prox with stent assisted angioplasty and stenting,and of Lt MCA with x1 pass with the Solitaire 70mm x 40 mm stent retrieval device.TICI 3 revascularization Pt returned from IR on vent . PCCM consulted for vent management.    PAST MEDICAL HISTORY :   has a past medical history of Hypertension; GERD (gastroesophageal reflux disease); Hypercholesteremia; and HOH (hard of hearing).  has past surgical history that includes Shoulder surgery (Right); Esophagogastroduodenoscopy (10/28/2010); Colonoscopy; Colonoscopy (05/17/2011); Resection distal clavical (Right, 12/13/2012); Shoulder arthroscopy with bicepstenotomy (Right, 12/13/2012); and Esophagogastroduodenoscopy (N/A, 04/24/2014). Prior to Admission medications   Medication Sig Start Date End Date Taking? Authorizing Provider  alfuzosin (UROXATRAL) 10 MG 24 hr tablet Take 10 mg by mouth daily with breakfast.   Yes Historical Provider, MD  amLODipine (NORVASC) 5 MG tablet Take 5 mg by mouth daily.   Yes Historical Provider, MD  atorvastatin (LIPITOR) 40 MG tablet Take 40 mg by mouth every evening.    Yes Historical Provider, MD  esomeprazole (NEXIUM) 40 MG capsule Take 40 mg by mouth daily.   Yes Historical Provider, MD  hydrochlorothiazide (HYDRODIURIL) 25 MG tablet  Take 25 mg by mouth daily.   Yes Historical Provider, MD  meclizine (ANTIVERT) 25 MG tablet Take 25 mg by mouth 3 (three) times daily as needed for dizziness.   Yes  Historical Provider, MD  HYDROcodone-acetaminophen (NORCO) 7.5-325 MG per tablet Take 1 tablet by mouth every 4 (four) hours as needed for pain. Patient not taking: Reported on 04/13/2014 12/13/12   Carole Civil, MD  methocarbamol (ROBAXIN) 500 MG tablet Take 1 tablet (500 mg total) by mouth 3 (three) times daily. Patient not taking: Reported on 12/29/2013 12/13/12   Carole Civil, MD  promethazine (PHENERGAN) 12.5 MG tablet Take 1 tablet (12.5 mg total) by mouth every 6 (six) hours as needed for nausea. Patient not taking: Reported on 12/29/2013 12/13/12   Carole Civil, MD   No Known Allergies  FAMILY HISTORY:  indicated that his mother is deceased. He indicated that his father is deceased. He indicated that his sister is deceased. He indicated that his brother is deceased.  SOCIAL HISTORY:  reports that he quit smoking about 45 years ago. His smoking use included Cigarettes. He has a 17.5 pack-year smoking history. He does not have any smokeless tobacco history on file. He reports that he drinks about 1.8 oz of alcohol per week. He reports that he does not use illicit drugs.  REVIEW OF SYSTEMS:  Unable to obtain on vent   SUBJECTIVE:   Return from IR  On vent sedated    VITAL SIGNS: Temp:  [98.1 F (36.7 C)-98.3 F (36.8 C)] 98.3 F (36.8 C) (04/09 0414) Pulse Rate:  [67-106] 95 (04/09 0615) Resp:  [11-26] 17 (04/09 0530) BP: (137-177)/(54-100) 149/72 mmHg (04/09 0615) SpO2:  [94 %-100 %] 97 % (04/09 0615) Weight:  [212 lb (96.163 kg)] 212 lb (96.163 kg) (04/09 0205) HEMODYNAMICS:   VENTILATOR SETTINGS:   INTAKE / OUTPUT:  Intake/Output Summary (Last 24 hours) at 05/23/14 1053 Last data filed at 05/23/14 1048  Gross per 24 hour  Intake   2000 ml  Output    300 ml  Net   1700 ml    PHYSICAL EXAMINATION: General: sedated on vent  Neuro:  Sedated , RAAS -2  HEENT:  ETT  Cardiovascular:  Reg , no m/r/g Lungs:  Decreased BS in bases  Abdomen:  Soft ,  BS hypoactive  Musculoskeletal:  Intact  Skin:  Intact   LABS:  CBC  Recent Labs Lab 05/23/14 0158  WBC 8.5  HGB 13.9  HCT 42.3  PLT 226   Coag's  Recent Labs Lab 05/23/14 0158  APTT 27  INR 1.06   BMET  Recent Labs Lab 05/23/14 0158  NA 138  K 4.3  CL 103  CO2 24  BUN 19  CREATININE 0.91  GLUCOSE 137*   Electrolytes  Recent Labs Lab 05/23/14 0158  CALCIUM 9.0   Sepsis Markers No results for input(s): LATICACIDVEN, PROCALCITON, O2SATVEN in the last 168 hours. ABG No results for input(s): PHART, PCO2ART, PO2ART in the last 168 hours. Liver Enzymes  Recent Labs Lab 05/23/14 0158  AST 22  ALT 28  ALKPHOS 52  BILITOT 0.6  ALBUMIN 3.7   Cardiac Enzymes  Recent Labs Lab 05/23/14 0158  TROPONINI <0.03   Glucose  Recent Labs Lab 05/23/14 0545  GLUCAP 135*    Imaging Dg Epidurography  05/22/2014   CLINICAL DATA:  Lumbosacral spondylosis without myelopathy. LEFT leg radicular symptoms.  DDD (degenerative disc disease), lumbar M51.36 (ICD-10-CM)  Subsequent encounter.  EXAM:  LUMBAR INTERLAMINAR EPIDURAL INJECTION  FLUOROSCOPY TIME:  8 seconds corresponding to a Dose Area Product of 20 Gy*m2  PROCEDURE: Procedure: Informed written consent was obtained. Time-out was performed.  We discussed the moderate likelihood of moderate lasting relief/attainment of therapeutic goal. The overlying skin was cleansed with betadine soap and anesthetized with 1% lidocaine without epinephrine. An interlaminar approach was performed at L4-5 on the LEFT. 20 gauge needle was advanced using loss-of-resistance technique.  DIAGNOSTIC/ THERAPEUTIC EPIDURAL INJECTION: Injection of Omnipaque 180 shows a good epidural pattern with spread above and below the level of needle placement, primarily on the side of needle placement. No vascular or subarachnoid opacification was seen. 120 mg of Depo-Medrol mixed with 5 cc of 1% Lidocaine were instilled. The procedure was well-tolerated,  and the patient was discharged thirty minutes following the injection in good condition.  IMPRESSION: Technically successful LEFT L4-5 lumbar interlaminar epidural injection.   Electronically Signed   By: Rolla Flatten M.D.   On: 05/22/2014 12:22     ASSESSMENT / PLAN:  PULMONARY OETT 4/9 A: Respiratory Failure /Inability to protect airway due to CVA  P:   Vent support  Daily SBT  Assess for wean/extubation in am if mentation improved  ABG in 1 hr  VAP precautions  Check cxr   CARDIOVASCULAR CVL none  A: HTN >trop neg  P:  SB/P goal 120-140 Hold home meds  SCD  2 D echo pending  Check cholesterol in am   RENAL A:  No acute issues  P:   Replace electrolytes as indicated  Check bmet in am  Cont IVF at [place] A:  GI prophalaxis  P:   NPO  PPI   HEMATOLOGIC A:  No acute issues  P :  Monitor cbc    INFECTIOUS A:  None apparent  P:   Follow temp/wbc curve   ENDOCRINE A:  Hyperglycemia  P:   Add SSI if BS tr up   NEUROLOGIC A:   P:   RASS goal: -2  Cont sedation    FAMILY  - Updates: none present 4/9    - Inter-disciplinary family meet or Palliative Care meeting due by:  day 7  /4/16     TODAY'S SUMMARY: 54 yo64 yo admitted with syncope/right sided weakness out of TPA window s/p angioplasty to acutely occluded L ICA  And stent to L MCA       PARRETT,TAMMY NP-C  Pulmonary and Critical Care Medicine [facility] Pager: [phone]  05/23/2014, 10:53 AM   Reviewed above, and examined.  80 yo male with Rt sided weakness and found to have Lt ICA occlusion s/p neuro IR.  He remains on vent post procedure.  RASS -5, on full vent support, lungs clear, heart rate regular, abd soft, no edema.  Will continue full vent support until neuro status stable, goal SBP 120 to 150.  Chesley Mires, MD [facility] Pulmonary/Critical Care 05/23/2014, 12:54 PM Pager:  [phone] After 3pm call: [phone]

## 2014-05-23 NOTE — ED Notes (Signed)
The patient is attempting to pull at his foley.  The family and I are reorienting the patient.

## 2014-05-23 NOTE — ED Notes (Signed)
Lita Mains MD, April RN, and neurology MD at bedside.

## 2014-05-23 NOTE — ED Notes (Signed)
Chaplain paged to communicate with family.

## 2014-05-24 ENCOUNTER — Inpatient Hospital Stay (HOSPITAL_COMMUNITY): Payer: Medicare Other

## 2014-05-24 DIAGNOSIS — R531 Weakness: Secondary | ICD-10-CM | POA: Insufficient documentation

## 2014-05-24 LAB — LIPID PANEL
CHOLESTEROL: 110 mg/dL (ref 0–200)
HDL: 35 mg/dL — AB (ref >39)
LDL CALC: 59 mg/dL (ref 0–99)
Total CHOL/HDL Ratio: 3.1 RATIO
Triglycerides: 79 mg/dL (ref <150)
VLDL: 16 mg/dL (ref 0–40)

## 2014-05-24 LAB — CBC WITH DIFFERENTIAL/PLATELET
BASOS PCT: 0 % (ref 0–1)
Basophils Absolute: 0 10*3/uL (ref 0.0–0.1)
EOS ABS: 0 10*3/uL (ref 0.0–0.7)
Eosinophils Relative: 0 % (ref 0–5)
HCT: 34.1 % — ABNORMAL LOW (ref 39.0–52.0)
Hemoglobin: 10.9 g/dL — ABNORMAL LOW (ref 13.0–17.0)
Lymphocytes Relative: 19 % (ref 12–46)
Lymphs Abs: 2 10*3/uL (ref 0.7–4.0)
MCH: 27.5 pg (ref 26.0–34.0)
MCHC: 32 g/dL (ref 30.0–36.0)
MCV: 85.9 fL (ref 78.0–100.0)
MONO ABS: 1.2 10*3/uL — AB (ref 0.1–1.0)
Monocytes Relative: 11 % (ref 3–12)
Neutro Abs: 7.7 10*3/uL (ref 1.7–7.7)
Neutrophils Relative %: 70 % (ref 43–77)
Platelets: 218 10*3/uL (ref 150–400)
RBC: 3.97 MIL/uL — AB (ref 4.22–5.81)
RDW: 13.8 % (ref 11.5–15.5)
WBC: 11 10*3/uL — ABNORMAL HIGH (ref 4.0–10.5)

## 2014-05-24 LAB — BASIC METABOLIC PANEL
ANION GAP: 12 (ref 5–15)
BUN: 15 mg/dL (ref 6–23)
CALCIUM: 7.9 mg/dL — AB (ref 8.4–10.5)
CO2: 17 mmol/L — ABNORMAL LOW (ref 19–32)
Chloride: 108 mmol/L (ref 96–112)
Creatinine, Ser: 0.95 mg/dL (ref 0.50–1.35)
GFR, EST NON AFRICAN AMERICAN: 78 mL/min — AB (ref >90)
Glucose, Bld: 142 mg/dL — ABNORMAL HIGH (ref 70–99)
Potassium: 3.6 mmol/L (ref 3.5–5.1)
Sodium: 137 mmol/L (ref 135–145)

## 2014-05-24 LAB — PLATELET INHIBITION P2Y12: Platelet Function  P2Y12: 274 [PRU] (ref 194–418)

## 2014-05-24 MED ORDER — MIDAZOLAM HCL 2 MG/2ML IJ SOLN: 2.0000 mg | INTRAMUSCULAR | Status: DC | PRN

## 2014-05-24 MED ORDER — POTASSIUM CHLORIDE 10 MEQ/100ML IV SOLN
10.0000 meq | INTRAVENOUS | Status: AC
  Administered 2014-05-24 (×2): 10 meq via INTRAVENOUS
  Filled 2014-05-24: qty 100

## 2014-05-24 NOTE — Progress Notes (Signed)
[facility] ADULT ICU REPLACEMENT PROTOCOL FOR AM LAB REPLACEMENT ONLY  The patient does apply for the [facility] Adult ICU Electrolyte Replacment Protocol based on the criteria listed below:   1. Is GFR >/= 40 ml/min? Yes.    Patient's GFR today is 78 2. Is urine output >/= 0.5 ml/kg/hr for the last 6 hours? Yes.   Patient's UOP is 0.5 ml/kg/hr 3. Is BUN < 60 mg/dL? Yes.    Patient's BUN today is 15 4. Abnormal electrolyte(s): K3.6 5. Ordered repletion with: KCL per protocol 6. If a panic level lab has been reported, has the CCM MD in charge been notified? Yes.  .   Physician:  E Deterding,MD  [facility] Clock 05/24/2014 6:20 AM

## 2014-05-24 NOTE — Progress Notes (Addendum)
Bilateral carotid artery duplex completed.  Right:  ECA stenosis.  40-59% internal carotid artery stenosis by velocities.  But ICA/CCA ratio of 3.07 suggests stenosis in the 60-79% range.  Left:  <50% stenosis in the stent.  Bilateral:  Vertebral artery flow is antegrade.

## 2014-05-24 NOTE — Progress Notes (Signed)
Interventional radiology here at bedside to remove 81fr right groin sheath.  Pt id'ed by armband.  Area prepped and draped. exoseal used as vascular closure.  Pressure held until hemostasis achieved.  V-pad and tegaderm applied.  Pressure dressing with sandbag also applied.  RN Tammy here at bedside. No complications noted.  jkc/bf

## 2014-05-24 NOTE — Consult Note (Signed)
PULMONARY / CRITICAL CARE MEDICINE   Name: Henry [name] MRN: [id number] DOB: 1934-09-29    ADMISSION DATE:  05/23/2014 CONSULTATION DATE:  05/23/14  REFERRING MD : Neuro   CHIEF COMPLAINT:  Vent Management   INITIAL PRESENTATION:  80 yo admitted with syncope/right sided weakness found to have a CVA (out of TPA window) s/p angioplasty/stent  to acutely occluded L ICA  /L MCA   STUDIES:  CT Head 4/9 >No acute intracranial abnormality or significant interval change. 2. Stable atrophy and diffuse white matter disease. This likely reflects the sequela of chronic microvascular ischemia. CTA brain 4/9 >Nonvisualization of the left internal carotid artery, occluded proximally within the neck. There is distal reconstitution at the supraclinoid segment of the left ICA via contralateral flow across the circle of Willis. Flow within the left M1 segment is present but somewhat thready and irregular in appearance, which may in part be related to motion artifact on this exam. There is question of a superimposed focal moderate to severe stenosis and/or partially occlusive thrombus within the distal left M1/ proximal M2 segment near the base of the sylvian fissure. CTA imaging of the neck is suggested to evaluate the extent of the left ICA occlusion. CT cerebral perfusion 4/9 >Area of ischemic core infarct within the left MCA territory, most prevalent within the anterior left frontal lobe MRI 4/10 >>  SIGNIFICANT EVENTS: 4/9 IR >S/P lt common carotid arteriogram,followed by complete revascularization of acutely occluded Lt ICA prox with stent assisted angioplasty and stenting,and of Lt MCA with x1 pass     SUBJECTIVE:   Moves left side, On vent sedated  B/p dropped yesterday , pressors started  Sheath to be removed today around lunchtime Weaning this am with good vol /sats  MRI today    VITAL SIGNS: Temp:  [96.3 F (35.7 C)-99.3 F (37.4 C)] 99.3 F (37.4 C) (04/10 0747) Pulse Rate:   [44-80] 48 (04/10 0945) Resp:  [7-23] 17 (04/10 0945) BP: (81-145)/(31-82) 145/58 mmHg (04/10 0900) SpO2:  [97 %-100 %] 100 % (04/10 0945) Arterial Line BP: (81-175)/(45-96) 132/56 mmHg (04/10 0945) FiO2 (%):  [40 %-50 %] 40 % (04/10 0913) Weight:  [224 lb 3.3 oz (101.7 kg)] 224 lb 3.3 oz (101.7 kg) (04/09 1100) HEMODYNAMICS:   VENTILATOR SETTINGS: Vent Mode:  [-] PSV;CPAP FiO2 (%):  [40 %-50 %] 40 % Set Rate:  [14 bmp] 14 bmp Vt Set:  [600 mL] 600 mL PEEP:  [5 cmH20] 5 cmH20 Pressure Support:  [12 cmH20] 12 cmH20 Plateau Pressure:  [17 cmH20-20 cmH20] 17 cmH20 INTAKE / OUTPUT:  Intake/Output Summary (Last 24 hours) at 05/24/14 0959 Last data filed at 05/24/14 0900  Gross per 24 hour  Intake 4319.56 ml  Output   1480 ml  Net 2839.56 ml    PHYSICAL EXAMINATION: General: sedated on vent  Neuro:  Sedated , RAAS -2  HEENT:  ETT  Cardiovascular:  Reg , no m/r/g Lungs:  Decreased BS in bases  Abdomen:  Soft , BS hypoactive  Musculoskeletal:  Intact  Skin:  Intact   LABS:  CBC  Recent Labs Lab 05/23/14 0158 05/24/14 0354  WBC 8.5 11.0*  HGB 13.9 10.9*  HCT 42.3 34.1*  PLT 226 218   Coag's  Recent Labs Lab 05/23/14 0158  APTT 27  INR 1.06   BMET  Recent Labs Lab 05/23/14 0158 05/24/14 0354  NA 138 137  K 4.3 3.6  CL 103 108  CO2 24 17*  BUN 19 15  CREATININE 0.91 0.95  GLUCOSE 137* 142*   Electrolytes  Recent Labs Lab 05/23/14 0158 05/24/14 0354  CALCIUM 9.0 7.9*   Sepsis Markers No results for input(s): LATICACIDVEN, PROCALCITON, O2SATVEN in the last 168 hours. ABG  Recent Labs Lab 05/23/14 1243  PHART 7.316*  PCO2ART 38.8  PO2ART 135.0*   Liver Enzymes  Recent Labs Lab 05/23/14 0158  AST 22  ALT 28  ALKPHOS 52  BILITOT 0.6  ALBUMIN 3.7   Cardiac Enzymes  Recent Labs Lab 05/23/14 0158  TROPONINI <0.03   Glucose  Recent Labs Lab 05/23/14 0545  GLUCAP 135*    Imaging Ct Angio Head W/cm &/or Wo Cm  05/23/2014    CLINICAL DATA:  Initial evaluation for acute syncope, right-sided weakness.  EXAM: CT ANGIOGRAPHY HEAD  TECHNIQUE: Multidetector CT imaging of the head was performed using the standard protocol during bolus administration of intravenous contrast. Multiplanar CT image reconstructions and MIPs were obtained to evaluate the vascular anatomy.  CONTRAST:  1100mL OMNIPAQUE IOHEXOL 350 MG/ML SOLN  COMPARISON:  Prior CT from earlier the same day.  FINDINGS: CTA HEAD  Study is degraded by motion artifact.  Anterior circulation: Visualized portion of the distal segment of the right internal carotid artery is widely patent. The petrous, cavernous and supraclinoid segments of the right ICA are widely patent. Minimal calcified plaque present within the right cavernous segment without significant stenosis.  The left internal carotid artery is not visualized within the distal neck, likely occluded proximally. The left ICA remains occluded at its petrous and cavernous segments. There is some distal reconstitution at the supraclinoid segment, likely via contralateral flow cross the circle of Willis. The A1 segments and anterior communicating artery are widely patent. A anterior cerebral arteries are patent bilaterally, although poorly evaluated distally due to motion artifact.  There is somewhat thready an irregular flow within the left M1 segment, which may in part be related to motion artifact. There is question of superimposed focal moderate to severe stenosis within the distal left M1/ proximal left M2 segment at the base of the sylvian fissure. The distal left MCA branches are opacified.  Right M1 segment and its distal branches are opacified.  Posterior circulation: Right vertebral artery is dominant and widely patent to the vertebrobasilar junction. The somewhat diminutive left vertebral artery patent as well. Posterior inferior cerebral arteries are patent bilaterally. Basilar artery is widely patent. Superior cerebellar  arteries patent proximally. Posterior cerebral arteries are opacified bilaterally without high-grade stenosis or occlusion. A small left posterior communicating artery is present.  Venous sinuses: No abnormality identified within the venous sinuses.  Anatomic variants: No anatomic variant.  Delayed phase:No abnormal enhancement on delayed sequences. Atrophy with chronic microvascular ischemic disease noted.  IMPRESSION: 1. Nonvisualization of the left internal carotid artery, occluded proximally within the neck. There is distal reconstitution at the supraclinoid segment of the left ICA via contralateral flow across the circle of Willis. Flow within the left M1 segment is present but somewhat thready and irregular in appearance, which may in part be related to motion artifact on this exam. There is question of a superimposed focal moderate to severe stenosis and/or partially occlusive thrombus within the distal left M1/ proximal M2 segment near the base of the sylvian fissure. CTA imaging of the neck is suggested to evaluate the extent of the left ICA occlusion. 2. Widely patent right internal carotid artery system. 3. Widely patent vertebrobasilar system. A small left posterior communicating artery is present. Critical Value/emergent results  were called by telephone at the time of interpretation on 05/23/2014 at 3:51 am to Dr. Rolland Porter , who verbally acknowledged these results.   Electronically Signed   By: Jeannine Boga M.D.   On: 05/23/2014 03:58   Ct Head Wo Contrast  05/23/2014   CLINICAL DATA:  80 year old male post left internal carotid artery occlusion post endovascular stenting. Subsequent encounter.  EXAM: CT HEAD WITHOUT CONTRAST  TECHNIQUE: Contiguous axial images were obtained from the base of the skull through the vertex without intravenous contrast.  COMPARISON:  05/23/2014.  FINDINGS: Post angiography without evidence of intracranial hemorrhage or intracranial enhancing lesion.  Indistinctness  left subinsular region, left basal ganglia and portions of the left corona radiata/left frontal lobe may represent result of acute infarct.  Prominent small vessel disease type changes.  Atrophy without hydrocephalus.  Ethmoid sinus air cell mucosal thickening.  Exophthalmos.  IMPRESSION: Post angiography without evidence of intracranial hemorrhage or intracranial enhancing lesion.  Indistinctness left subinsular region, left basal ganglia and portions of the left corona radiata/left frontal lobe may represent result of acute infarct.  Prominent small vessel disease type changes.   Electronically Signed   By: Genia Del M.D.   On: 05/23/2014 10:53   Ct Head Wo Contrast  05/23/2014   CLINICAL DATA:  New right-sided weakness.  Unresponsive.  A aphasia.  EXAM: CT HEAD WITHOUT CONTRAST  TECHNIQUE: Contiguous axial images were obtained from the base of the skull through the vertex without intravenous contrast.  COMPARISON:  CT head without contrast 12/29/2013.  FINDINGS: Atrophy and extensive subcortical white matter disease is not significantly changed. No acute cortical infarct is present. The basal ganglia are intact. The ventricles are proportionate to the degree of atrophy.  Paranasal sinuses and mastoid air cells are clear. The calvarium is intact. No significant extra-axial fluid collection is present.  IMPRESSION: 1. No acute intracranial abnormality or significant interval change. 2. Stable atrophy and diffuse white matter disease. This likely reflects the sequela of chronic microvascular ischemia. These results were called by telephone at the time of interpretation on 05/23/2014 at 2:06 am to Dr. Rolland Porter , who verbally acknowledged these results.   Electronically Signed   By: San Morelle M.D.   On: 05/23/2014 02:07   Ct Cerebral Perfusion W/cm  05/23/2014   CLINICAL DATA:  80 year old male with history of left ICA occlusion, evaluate for stroke.  EXAM: CT CEREBRAL PERFUSION WITH CONTRAST   CONTRAST:  57mL OMNIPAQUE IOHEXOL 350 MG/ML SOLN  COMPARISON:  Comparison made with prior CTA performed earlier the same day.  FINDINGS: Dedicated cerebral perfusion maps demonstrate a focal area of slightly decreased cerebral blood volume and cerebral blood flow with elevated time to peak, compatible with core infarct in the anterior left frontal lobe. Adjacent to this, there are scattered area is of elevated is time to peak with relatively preserved cerebral blood volume and cerebral blood flow, compatible with ischemic penumbra within the left MCA territory.  IMPRESSION: 1. Area of ischemic core infarct within the left MCA territory, most prevalent within the anterior left frontal lobe. 2. Adjacent areas of elevated time to peak with relatively preserved CBV in the left MCA territory. Finding suspicious for ischemic penumbra. Size of ischemic penumbra overall approximately 1/3 the size of affected area. Critical Value/emergent results were called by telephone at the time of interpretation on 05/23/2014 at 5:43 am to Dr. Jim Like , who verbally acknowledged these results.   Electronically Signed   By: Marland Kitchen  Jeannine Boga M.D.   On: 05/23/2014 05:45   Dg Chest Port 1 View  05/23/2014   CLINICAL DATA:  Respiratory failure  EXAM: PORTABLE CHEST - 1 VIEW  COMPARISON:  12/29/2013  FINDINGS: Borderline cardiomegaly. Central mild vascular congestion and mild perihilar increased bronchial markings. No convincing pulmonary edema. Mild basilar atelectasis without segmental infiltrate. Endotracheal tube in place with tip 3.3 cm above the carina. NG tube in place.  IMPRESSION: Central mild vascular congestion and mild perihilar increased bronchial markings. No convincing pulmonary edema. Mild basilar atelectasis without segmental infiltrate. Endotracheal tube in place with tip 3.3 cm above the carina. NG tube in place.   Electronically Signed   By: Lahoma Crocker M.D.   On: 05/23/2014 11:31     ASSESSMENT /  PLAN:  PULMONARY OETT 4/9 A: Respiratory Failure /Inability to protect airway due to CVA  P:   Vent support -wean as able  Assess extubation after sheath removal  VAP precautions   CARDIOVASCULAR CVL none  A: HTN >trop neg  Echo 4/9 >mild LVH, EF 65%, GR1 DD , PAP 35  P:  SB/P goal 120-140 Hold home meds  SCD     RENAL A:  No acute issues  P:   Replace electrolytes as indicated  Check bmet in am  Cont IVF at [place] A:  GI prophalaxis  P:   NPO  PPI   HEMATOLOGIC A:  No acute issues  P :  Monitor cbc    INFECTIOUS A:  None apparent  P:   Follow temp/wbc curve   ENDOCRINE A:  Hyperglycemia  P:   Add SSI if BS tr up   NEUROLOGIC A:   P:   RASS goal: -2  Cont sedation    FAMILY  - Updates: none present 4/9    - Inter-disciplinary family meet or Palliative Care meeting due by:  day 7  /4/16     TODAY'S SUMMARY: 80 yo admitted with syncope/right sided weakness out of TPA window s/p angioplasty to acutely occluded L ICA  And stent to L MCA . Hypotension with sedation on pressors ,  moving left side . Hope to extubate after sheath removed.  MRI to follow.       [facility] NP-C  Pulmonary and [place] Pager: [phone]  05/24/2014, 9:59 AM  Reviewed above and examined.  He is tolerating pressure support.  Remains on sedation until sheath removed later today.  He is scheduled for MRI also.  Chest clear, abd soft, no edema.  Will keep on vent until after sheath removed, and MRI done.  Defer to neuro IR whether his SBP parameters can be liberalized so that he can come off of levophed.  Updated pts family at bedside.  CC time by me independent of APP time is 35 minutes.  Chesley Mires, MD [facility] [place] Pulmonary/Critical Care 05/24/2014, 10:58 AM Pager:  [phone] After 3pm call: [phone]

## 2014-05-24 NOTE — H&P (Signed)
Stroke Consult   Chief Complaint: syncope and right sided weakness HPI: Henry [name] is an 80 y.o. male hx of HTN, HLD presenting to [facility] with syncopal episode. Upon arrival found to be aphasic with right sided weakness. LSW 2100. Outside IV tPA window by time of arrival. A CT angiogram was ordered and the patient was transferred to [facility] for possible IR intervention. Upon arrival patient unable to provide further history and no family present. CT head and CT angiogram imaging reviewed with neuro-radiology. Findings pertinent for nonvisualization of the left ICA, occluded proximally within the neck. Case discussed with neuro-IR, Dr Estanislado Pandy, will proceed with perfusion study.   Initial NIHSS of 15 (though likely would be higher as unable to fully participate in testing).   Date last known well: 05/22/2014 Time last known well: 2100 tPA Given: no, outside IV tPA window.  Past Medical History  Diagnosis Date  . Hypertension   . GERD (gastroesophageal reflux disease)   . Hypercholesteremia   . HOH (hard of hearing)     Past Surgical History  Procedure Laterality Date  . Shoulder surgery Right     torn cartiledge  . Esophagogastroduodenoscopy  10/28/2010    Procedure: ESOPHAGOGASTRODUODENOSCOPY (EGD); Surgeon: [name], MD; Location: [facility]; Service: Endoscopy; Laterality: N/A; 10:30  . Colonoscopy    . Colonoscopy  05/17/2011    Procedure: COLONOSCOPY; Surgeon: [name], MD; Location: [facility]; Service: Endoscopy; Laterality: N/A; 830  . Resection distal clavical Right 12/13/2012    Procedure: RESECTION DISTAL CLAVICAL; Surgeon: Carole Civil, MD; Location: [facility]; Service: Orthopedics; Laterality: Right;  . Shoulder arthroscopy with bicepstenotomy Right 12/13/2012    Procedure: SHOULDER ARTHROSCOPY WITH BICEPS TENOTOMY, limited shoulder debridement; Surgeon: Carole Civil, MD; Location: [facility]; Service: Orthopedics; Laterality: Right;  . Esophagogastroduodenoscopy N/A 04/24/2014    Procedure: ESOPHAGOGASTRODUODENOSCOPY (EGD); Surgeon: [name], MD; Location: [facility]; Service: Endoscopy; Laterality: N/A; 8    Family History  Problem Relation Age of Onset  . Colon cancer Neg Hx    Social History:  reports that he quit smoking about 45 years ago. His smoking use included Cigarettes. He has a 17.5 pack-year smoking history. He does not have any smokeless tobacco history on file. He reports that he drinks about 1.8 oz of alcohol per week. He reports that he does not use illicit drugs.  Allergies: No Known Allergies   (Not in a hospital admission)  ROS: Out of a complete 14 system review, the patient complains of only the following symptoms, and all other reviewed systems are negative. Unable to obtain  Physical Examination: Filed Vitals:   05/23/14 0405  BP:   Pulse:   Temp: 98.3 F (36.8 C)  Resp:    Physical Exam  Constitutional: He appears well-developed and well-nourished.  Psych: Affect appropriate to situation Eyes: No scleral injection HENT: No OP obstrucion Head: Normocephalic.  Cardiovascular: Normal rate and regular rhythm.  Respiratory: Effort normal and breath sounds normal.  GI: Soft. Bowel sounds are normal. No distension. There is no tenderness.  Skin: WDI  Neurologic Examination: Mental Status: Lethargic but aroused with voice. Intermittently following commands though not consistent. Marked expressive aphasia. Mild to moderate dysarthria.  Cranial Nerves: II: optic discs not visualized, decreased blink to threat on right side, pupils equal, round, reactive to light III,IV, VI: ptosis not present, eyes midline, will look to the left, unable to get past midline to the right V,VII: right facial  weakness, unable to test facial sensation VIII: hearing normal  bilaterally IX,X: gag reflex present XI: unable to test XII: unable to test Motor: Unable to formally test but left side grossly appears to be 5/5 Drift of RUE and RLE. Appears able to lift UE and LE against gravity and minimal resistance Tone and bulk:normal tone throughout; no atrophy noted Sensory: withdrawal to noxious stimuli in all extremities though less briskly on the right Deep Tendon Reflexes: 2+ and symmetric throughout Plantars: Right: downgoingLeft: downgoing Cerebellar: Unable to test due to mental status Gait: unable to test  Laboratory Studies:  Basic Metabolic Panel:  Last Labs      Recent Labs Lab 05/23/14 0158  NA 138  K 4.3  CL 103  CO2 24  GLUCOSE 137*  BUN 19  CREATININE 0.91  CALCIUM 9.0      Liver Function Tests:  Last Labs      Recent Labs Lab 05/23/14 0158  AST 22  ALT 28  ALKPHOS 52  BILITOT 0.6  PROT 7.1  ALBUMIN 3.7      Last Labs     No results for input(s): LIPASE, AMYLASE in the last 168 hours.    Last Labs     No results for input(s): AMMONIA in the last 168 hours.    CBC:  Last Labs      Recent Labs Lab 05/23/14 0158  WBC 8.5  NEUTROABS 6.5  HGB 13.9  HCT 42.3  MCV 85.5  PLT 226      Cardiac Enzymes:  Last Labs      Recent Labs Lab 05/23/14 0158  TROPONINI <0.03      BNP:  Last Labs     Invalid input(s): POCBNP    CBG:  Last Labs     No results for input(s): GLUCAP in the last 168 hours.    Microbiology: Results for orders placed or performed during the hospital encounter of 08/25/06  Culture, blood (routine x 2) Status: None   Collection Time: 08/25/06 9:30 AM  Result Value Ref Range Status   Specimen Description BLOOD LEFT ARM  Final   Special Requests   Final    BOTTLES DRAWN AEROBIC AND ANAEROBIC [place]   Culture NO GROWTH 5 DAYS  Final   Report Status  [id number] FINAL  Final    Coagulation Studies:  Recent Labs (last 2 labs)      Recent Labs  05/23/14 0158  LABPROT 13.9  INR 1.06       Last Labs     Urinalysis: No results for input(s): COLORURINE, LABSPEC, PHURINE, GLUCOSEU, HGBUR, BILIRUBINUR, KETONESUR, PROTEINUR, UROBILINOGEN, NITRITE, LEUKOCYTESUR in the last 168 hours.  Invalid input(s): APPERANCEUR    Lipid Panel:   Labs (Brief)    No results found for: CHOL, TRIG, HDL, CHOLHDL, VLDL, LDLCALC    HgbA1C:    Recent Labs    No results found for: HGBA1C    Urine Drug Screen:    Labs (Brief)    No results found for: LABOPIA, COCAINSCRNUR, LABBENZ, AMPHETMU, THCU, LABBARB     Last Labs     Alcohol Level:  Recent Labs Lab 05/23/14 0158  ETH <5      Other results:  Imaging:  Imaging Results (Last 48 hours)    Ct Angio Head W/cm &/or Wo Cm  05/23/2014 CLINICAL DATA: Initial evaluation for acute syncope, right-sided weakness. EXAM: CT ANGIOGRAPHY HEAD TECHNIQUE: Multidetector CT imaging of the head was performed using the standard protocol  during bolus administration of intravenous contrast. Multiplanar CT image reconstructions and MIPs were obtained to evaluate the vascular anatomy. CONTRAST: 171mL OMNIPAQUE IOHEXOL 350 MG/ML SOLN COMPARISON: Prior CT from earlier the same day. FINDINGS: CTA HEAD Study is degraded by motion artifact. Anterior circulation: Visualized portion of the distal segment of the right internal carotid artery is widely patent. The petrous, cavernous and supraclinoid segments of the right ICA are widely patent. Minimal calcified plaque present within the right cavernous segment without significant stenosis. The left internal carotid artery is not visualized within the distal neck, likely occluded proximally. The left ICA remains occluded at its petrous and cavernous segments. There is some distal reconstitution at the supraclinoid segment, likely via contralateral flow  cross the circle of Willis. The A1 segments and anterior communicating artery are widely patent. A anterior cerebral arteries are patent bilaterally, although poorly evaluated distally due to motion artifact. There is somewhat thready an irregular flow within the left M1 segment, which may in part be related to motion artifact. There is question of superimposed focal moderate to severe stenosis within the distal left M1/ proximal left M2 segment at the base of the sylvian fissure. The distal left MCA branches are opacified. Right M1 segment and its distal branches are opacified. Posterior circulation: Right vertebral artery is dominant and widely patent to the vertebrobasilar junction. The somewhat diminutive left vertebral artery patent as well. Posterior inferior cerebral arteries are patent bilaterally. Basilar artery is widely patent. Superior cerebellar arteries patent proximally. Posterior cerebral arteries are opacified bilaterally without high-grade stenosis or occlusion. A small left posterior communicating artery is present. Venous sinuses: No abnormality identified within the venous sinuses. Anatomic variants: No anatomic variant. Delayed phase:No abnormal enhancement on delayed sequences. Atrophy with chronic microvascular ischemic disease noted. IMPRESSION: 1. Nonvisualization of the left internal carotid artery, occluded proximally within the neck. There is distal reconstitution at the supraclinoid segment of the left ICA via contralateral flow across the circle of Willis. Flow within the left M1 segment is present but somewhat thready and irregular in appearance, which may in part be related to motion artifact on this exam. There is question of a superimposed focal moderate to severe stenosis and/or partially occlusive thrombus within the distal left M1/ proximal M2 segment near the base of the sylvian fissure. CTA imaging of the neck is suggested to evaluate the extent of the left ICA  occlusion. 2. Widely patent right internal carotid artery system. 3. Widely patent vertebrobasilar system. A small left posterior communicating artery is present. Critical Value/emergent results were called by telephone at the time of interpretation on 05/23/2014 at 3:51 am to Dr. Rolland Porter , who verbally acknowledged these results. Electronically Signed By: Jeannine Boga M.D. On: 05/23/2014 03:58   Ct Head Wo Contrast  05/23/2014 CLINICAL DATA: New right-sided weakness. Unresponsive. A aphasia. EXAM: CT HEAD WITHOUT CONTRAST TECHNIQUE: Contiguous axial images were obtained from the base of the skull through the vertex without intravenous contrast. COMPARISON: CT head without contrast 12/29/2013. FINDINGS: Atrophy and extensive subcortical white matter disease is not significantly changed. No acute cortical infarct is present. The basal ganglia are intact. The ventricles are proportionate to the degree of atrophy. Paranasal sinuses and mastoid air cells are clear. The calvarium is intact. No significant extra-axial fluid collection is present. IMPRESSION: 1. No acute intracranial abnormality or significant interval change. 2. Stable atrophy and diffuse white matter disease. This likely reflects the sequela of chronic microvascular ischemia. These results were called by telephone at  the time of interpretation on 05/23/2014 at 2:06 am to Dr. Rolland Porter , who verbally acknowledged these results. Electronically Signed By: San Morelle M.D. On: 05/23/2014 02:07   Dg Epidurography  05/22/2014 CLINICAL DATA: Lumbosacral spondylosis without myelopathy. LEFT leg radicular symptoms. DDD (degenerative disc disease), lumbar M51.36 (ICD-10-CM) Subsequent encounter. EXAM: LUMBAR INTERLAMINAR EPIDURAL INJECTION FLUOROSCOPY TIME: 8 seconds corresponding to a Dose Area Product of 20 Gy*m2 PROCEDURE: Procedure: Informed written consent was obtained. Time-out was performed. We discussed  the moderate likelihood of moderate lasting relief/attainment of therapeutic goal. The overlying skin was cleansed with betadine soap and anesthetized with 1% lidocaine without epinephrine. An interlaminar approach was performed at L4-5 on the LEFT. 20 gauge needle was advanced using loss-of-resistance technique. DIAGNOSTIC/ THERAPEUTIC EPIDURAL INJECTION: Injection of Omnipaque 180 shows a good epidural pattern with spread above and below the level of needle placement, primarily on the side of needle placement. No vascular or subarachnoid opacification was seen. 120 mg of Depo-Medrol mixed with 5 cc of 1% Lidocaine were instilled. The procedure was well-tolerated, and the patient was discharged thirty minutes following the injection in good condition. IMPRESSION: Technically successful LEFT L4-5 lumbar interlaminar epidural injection. Electronically Signed By: Rolla Flatten M.D. On: 05/22/2014 12:22     Assessment: 80 y.o. male hx of HTN and HLD presenting with possible syncopal episode found to have profound aphasia and right sided weakness consistent with a L MCA infarct. CT angiogram shows nonvisualization of the left ICA, occluded proximally within the neck. Discussed with Dr Estanislado Pandy, patient sent for CT perfusion study. Based on CT perfusion patient sent for IR intervention.    Plan: 1. HgbA1c, fasting lipid panel 2. MRI, MRA of the brain without contrast 3. PT consult, OT consult, Speech consult 4. Echocardiogram 5. Carotid dopplers 6. Prophylactic therapy-hold antiplatelet post IR 7. Risk factor modification 8. Telemetry monitoring 9. Frequent neuro checks 10. NPO until RN stroke swallow screen  This patient is critically ill and at significant risk of neurological worsening, death and care requires constant monitoring of vital signs, hemodynamics,respiratory and cardiac monitoring,review of multiple databases, neurological assessment, discussion with family, other specialists and  medical decision making of high complexity. I spent 55 inutes of neurocritical care time in the care of this patient.    Jim Like, DO [facility] [phone]  If 7pm- 7am, please page neurology on call as listed in AMION. 05/23/2014, 4:14 AM

## 2014-05-24 NOTE — Progress Notes (Signed)
Patient ID: Henry [name], male   DOB: Feb 20, 1934, 80 y.o.   MRN: [id number]      Subjective:  Pt intubated, sedated; not F/C  Allergies: Review of patient's allergies indicates no known allergies.  Medications: Prior to Admission medications   Medication Sig Start Date End Date Taking? Authorizing Provider  alfuzosin (UROXATRAL) 10 MG 24 hr tablet Take 10 mg by mouth daily with breakfast.   Yes Historical Provider, MD  amLODipine (NORVASC) 5 MG tablet Take 5 mg by mouth daily.   Yes Historical Provider, MD  atorvastatin (LIPITOR) 40 MG tablet Take 40 mg by mouth every evening.    Yes Historical Provider, MD  esomeprazole (NEXIUM) 40 MG capsule Take 40 mg by mouth daily.   Yes Historical Provider, MD  hydrochlorothiazide (HYDRODIURIL) 25 MG tablet Take 25 mg by mouth daily.   Yes Historical Provider, MD  meclizine (ANTIVERT) 25 MG tablet Take 25 mg by mouth 3 (three) times daily as needed for dizziness.   Yes Historical Provider, MD  HYDROcodone-acetaminophen (NORCO) 7.5-325 MG per tablet Take 1 tablet by mouth every 4 (four) hours as needed for pain. Patient not taking: Reported on 04/13/2014 12/13/12   Carole Civil, MD  methocarbamol (ROBAXIN) 500 MG tablet Take 1 tablet (500 mg total) by mouth 3 (three) times daily. Patient not taking: Reported on 12/29/2013 12/13/12   Carole Civil, MD  promethazine (PHENERGAN) 12.5 MG tablet Take 1 tablet (12.5 mg total) by mouth every 6 (six) hours as needed for nausea. Patient not taking: Reported on 12/29/2013 12/13/12   Carole Civil, MD     Vital Signs: BP 103/45 mmHg  Pulse 48  Temp(Src) 99.3 F (37.4 C) (Axillary)  Resp 18  Ht 5\' 10"  (1.778 m)  Wt 224 lb 3.3 oz (101.7 kg)  BMI 32.17 kg/m2  SpO2 98%  Physical Exam intubated, sedated; pupils equal, reactive; purposeful movement on left, not right; chest- CTA bilat; heart- bradycardic, reg; abd- obese, soft, +BS ,NT; rt CFA with intact sheath, soft, no hematoma;  intact distal pulses  Imaging: Ct Angio Head W/cm &/or Wo Cm  05/23/2014   CLINICAL DATA:  Initial evaluation for acute syncope, right-sided weakness.  EXAM: CT ANGIOGRAPHY HEAD  TECHNIQUE: Multidetector CT imaging of the head was performed using the standard protocol during bolus administration of intravenous contrast. Multiplanar CT image reconstructions and MIPs were obtained to evaluate the vascular anatomy.  CONTRAST:  11mL OMNIPAQUE IOHEXOL 350 MG/ML SOLN  COMPARISON:  Prior CT from earlier the same day.  FINDINGS: CTA HEAD  Study is degraded by motion artifact.  Anterior circulation: Visualized portion of the distal segment of the right internal carotid artery is widely patent. The petrous, cavernous and supraclinoid segments of the right ICA are widely patent. Minimal calcified plaque present within the right cavernous segment without significant stenosis.  The left internal carotid artery is not visualized within the distal neck, likely occluded proximally. The left ICA remains occluded at its petrous and cavernous segments. There is some distal reconstitution at the supraclinoid segment, likely via contralateral flow cross the circle of Willis. The A1 segments and anterior communicating artery are widely patent. A anterior cerebral arteries are patent bilaterally, although poorly evaluated distally due to motion artifact.  There is somewhat thready an irregular flow within the left M1 segment, which may in part be related to motion artifact. There is question of superimposed focal moderate to severe stenosis within the distal left M1/ proximal left M2 segment  at the base of the sylvian fissure. The distal left MCA branches are opacified.  Right M1 segment and its distal branches are opacified.  Posterior circulation: Right vertebral artery is dominant and widely patent to the vertebrobasilar junction. The somewhat diminutive left vertebral artery patent as well. Posterior inferior cerebral arteries are  patent bilaterally. Basilar artery is widely patent. Superior cerebellar arteries patent proximally. Posterior cerebral arteries are opacified bilaterally without high-grade stenosis or occlusion. A small left posterior communicating artery is present.  Venous sinuses: No abnormality identified within the venous sinuses.  Anatomic variants: No anatomic variant.  Delayed phase:No abnormal enhancement on delayed sequences. Atrophy with chronic microvascular ischemic disease noted.  IMPRESSION: 1. Nonvisualization of the left internal carotid artery, occluded proximally within the neck. There is distal reconstitution at the supraclinoid segment of the left ICA via contralateral flow across the circle of Willis. Flow within the left M1 segment is present but somewhat thready and irregular in appearance, which may in part be related to motion artifact on this exam. There is question of a superimposed focal moderate to severe stenosis and/or partially occlusive thrombus within the distal left M1/ proximal M2 segment near the base of the sylvian fissure. CTA imaging of the neck is suggested to evaluate the extent of the left ICA occlusion. 2. Widely patent right internal carotid artery system. 3. Widely patent vertebrobasilar system. A small left posterior communicating artery is present. Critical Value/emergent results were called by telephone at the time of interpretation on 05/23/2014 at 3:51 am to Dr. Rolland Porter , who verbally acknowledged these results.   Electronically Signed   By: Jeannine Boga M.D.   On: 05/23/2014 03:58   Ct Head Wo Contrast  05/23/2014   CLINICAL DATA:  80 year old male post left internal carotid artery occlusion post endovascular stenting. Subsequent encounter.  EXAM: CT HEAD WITHOUT CONTRAST  TECHNIQUE: Contiguous axial images were obtained from the base of the skull through the vertex without intravenous contrast.  COMPARISON:  05/23/2014.  FINDINGS: Post angiography without evidence of  intracranial hemorrhage or intracranial enhancing lesion.  Indistinctness left subinsular region, left basal ganglia and portions of the left corona radiata/left frontal lobe may represent result of acute infarct.  Prominent small vessel disease type changes.  Atrophy without hydrocephalus.  Ethmoid sinus air cell mucosal thickening.  Exophthalmos.  IMPRESSION: Post angiography without evidence of intracranial hemorrhage or intracranial enhancing lesion.  Indistinctness left subinsular region, left basal ganglia and portions of the left corona radiata/left frontal lobe may represent result of acute infarct.  Prominent small vessel disease type changes.   Electronically Signed   By: Genia Del M.D.   On: 05/23/2014 10:53   Ct Head Wo Contrast  05/23/2014   CLINICAL DATA:  New right-sided weakness.  Unresponsive.  A aphasia.  EXAM: CT HEAD WITHOUT CONTRAST  TECHNIQUE: Contiguous axial images were obtained from the base of the skull through the vertex without intravenous contrast.  COMPARISON:  CT head without contrast 12/29/2013.  FINDINGS: Atrophy and extensive subcortical white matter disease is not significantly changed. No acute cortical infarct is present. The basal ganglia are intact. The ventricles are proportionate to the degree of atrophy.  Paranasal sinuses and mastoid air cells are clear. The calvarium is intact. No significant extra-axial fluid collection is present.  IMPRESSION: 1. No acute intracranial abnormality or significant interval change. 2. Stable atrophy and diffuse white matter disease. This likely reflects the sequela of chronic microvascular ischemia. These results were called by telephone  at the time of interpretation on 05/23/2014 at 2:06 am to Dr. Rolland Porter , who verbally acknowledged these results.   Electronically Signed   By: San Morelle M.D.   On: 05/23/2014 02:07   Dg Epidurography  05/22/2014   CLINICAL DATA:  Lumbosacral spondylosis without myelopathy. LEFT leg  radicular symptoms.  DDD (degenerative disc disease), lumbar M51.36 (ICD-10-CM)  Subsequent encounter.  EXAM: LUMBAR INTERLAMINAR EPIDURAL INJECTION  FLUOROSCOPY TIME:  8 seconds corresponding to a Dose Area Product of 20 Gy*m2  PROCEDURE: Procedure: Informed written consent was obtained. Time-out was performed.  We discussed the moderate likelihood of moderate lasting relief/attainment of therapeutic goal. The overlying skin was cleansed with betadine soap and anesthetized with 1% lidocaine without epinephrine. An interlaminar approach was performed at L4-5 on the LEFT. 20 gauge needle was advanced using loss-of-resistance technique.  DIAGNOSTIC/ THERAPEUTIC EPIDURAL INJECTION: Injection of Omnipaque 180 shows a good epidural pattern with spread above and below the level of needle placement, primarily on the side of needle placement. No vascular or subarachnoid opacification was seen. 120 mg of Depo-Medrol mixed with 5 cc of 1% Lidocaine were instilled. The procedure was well-tolerated, and the patient was discharged thirty minutes following the injection in good condition.  IMPRESSION: Technically successful LEFT L4-5 lumbar interlaminar epidural injection.   Electronically Signed   By: Rolla Flatten M.D.   On: 05/22/2014 12:22   Ct Cerebral Perfusion W/cm  05/23/2014   CLINICAL DATA:  80 year old male with history of left ICA occlusion, evaluate for stroke.  EXAM: CT CEREBRAL PERFUSION WITH CONTRAST  CONTRAST:  91mL OMNIPAQUE IOHEXOL 350 MG/ML SOLN  COMPARISON:  Comparison made with prior CTA performed earlier the same day.  FINDINGS: Dedicated cerebral perfusion maps demonstrate a focal area of slightly decreased cerebral blood volume and cerebral blood flow with elevated time to peak, compatible with core infarct in the anterior left frontal lobe. Adjacent to this, there are scattered area is of elevated is time to peak with relatively preserved cerebral blood volume and cerebral blood flow, compatible with  ischemic penumbra within the left MCA territory.  IMPRESSION: 1. Area of ischemic core infarct within the left MCA territory, most prevalent within the anterior left frontal lobe. 2. Adjacent areas of elevated time to peak with relatively preserved CBV in the left MCA territory. Finding suspicious for ischemic penumbra. Size of ischemic penumbra overall approximately 1/3 the size of affected area. Critical Value/emergent results were called by telephone at the time of interpretation on 05/23/2014 at 5:43 am to Dr. Jim Like , who verbally acknowledged these results.   Electronically Signed   By: Jeannine Boga M.D.   On: 05/23/2014 05:45   Dg Chest Port 1 View  05/23/2014   CLINICAL DATA:  Respiratory failure  EXAM: PORTABLE CHEST - 1 VIEW  COMPARISON:  12/29/2013  FINDINGS: Borderline cardiomegaly. Central mild vascular congestion and mild perihilar increased bronchial markings. No convincing pulmonary edema. Mild basilar atelectasis without segmental infiltrate. Endotracheal tube in place with tip 3.3 cm above the carina. NG tube in place.  IMPRESSION: Central mild vascular congestion and mild perihilar increased bronchial markings. No convincing pulmonary edema. Mild basilar atelectasis without segmental infiltrate. Endotracheal tube in place with tip 3.3 cm above the carina. NG tube in place.   Electronically Signed   By: Lahoma Crocker M.D.   On: 05/23/2014 11:31    Labs:  CBC:  Recent Labs  12/29/13 1638 05/23/14 0158 05/24/14 0354  WBC 7.4 8.5 11.0*  HGB 13.3 13.9 10.9*  HCT 40.3 42.3 34.1*  PLT 173 226 218    COAGS:  Recent Labs  05/23/14 0158  INR 1.06  APTT 27    BMP:  Recent Labs  12/29/13 1638 05/23/14 0158 05/24/14 0354  NA 137 138 137  K 3.7 4.3 3.6  CL 99 103 108  CO2 27 24 17*  GLUCOSE 115* 137* 142*  BUN 17 19 15   CALCIUM 8.6 9.0 7.9*  CREATININE 1.07 0.91 0.95  GFRNONAA 65* 80* 78*  GFRAA 75* >90 >90    LIVER FUNCTION TESTS:  Recent Labs   05/23/14 0158  BILITOT 0.6  AST 22  ALT 28  ALKPHOS 52  PROT 7.1  ALBUMIN 3.7    Assessment and Plan: S/p left MCA CVA 4/9 with complete revasc of occluded left ICA prox with stent assisted PTA, left MCA clot retrieval; P2Y12 today is 274- add additional dose of plavix today and recheck P2Y12 again in am; keep cervical collar on for now ; maintain SBP 120-140 range for additional 24 hrs; for art sheath removal this am and MRI brain afterwards; other plans as per neurology/CCM; above d/w Dr. Estanislado Pandy   Signed: Autumn Messing 05/24/2014, 10:44 AM   I spent a total of 15 minutes in face to face in clinical consultation/evaluation, greater than 50% of which was counseling/coordinating care for cerebral angio with revascularization of left ICA/MCA

## 2014-05-24 NOTE — Progress Notes (Signed)
Utilization review completed.  

## 2014-05-24 NOTE — Progress Notes (Signed)
PT Cancellation Note  Patient Details Name: Henry [name] MRN: [id number] DOB: 13-Nov-1934   Cancelled Treatment:    Reason Eval/Treat Not Completed: Patient not medically ready.  Pt continues to be on bedrest.  Will hold PT and mobility at this time and f/u as appropriate.     Oshay Stranahan, Thornton Papas 05/24/2014, 10:46 AM

## 2014-05-24 NOTE — Progress Notes (Signed)
Stroke Team Progress Note  HISTORY Henry [name] is an 80 y.o. male hx of HTN, HLD presenting to [facility] with syncopal episode. Upon arrival found to be aphasic with right sided weakness. LSW 2100. Outside IV tPA window by time of arrival. A CT angiogram was ordered and the patient was transferred to [facility] for possible IR intervention. Upon arrival patient unable to provide further history and no family present. CT head and CT angiogram imaging reviewed with neuro-radiology. Findings pertinent for nonvisualization of the left ICA, occluded proximally within the neck. Case discussed with neuro-IR, Dr Estanislado Pandy, will proceed with perfusion study.   Initial NIHSS of 15 (though likely would be higher as unable to fully participate in testing).   Date last known well: 05/22/2014 Time last known well: 2100 tPA Given: no, outside IV tPA window.   SUBJECTIVE The patient is sedated, intubated, not able to follow commands.  OBJECTIVE Most recent Vital Signs: Filed Vitals:   05/24/14 0700 05/24/14 0747 05/24/14 0800 05/24/14 0900  BP: 119/45  132/67 145/58  Pulse: 44  44 47  Temp:  99.3 F (37.4 C)    TempSrc:  Axillary    Resp: 14  15 14   Height:      Weight:      SpO2: 99%  100% 99%   CBG (last 3)   Recent Labs  05/23/14 0545  GLUCAP 135*    IV Fluid Intake:   . sodium chloride Stopped (05/23/14 1218)  . sodium chloride 75 mL/hr at 05/24/14 0903  . niCARDipine    . norepinephrine (LEVOPHED) Adult infusion 3 mcg/min (05/24/14 0917)  . propofol 30 mcg/kg/min (05/24/14 0917)    MEDICATIONS  .  stroke: mapping our early stages of recovery book   Does not apply Once  . antiseptic oral rinse  7 mL Mouth Rinse QID  . aspirin  325 mg Oral Q breakfast  . chlorhexidine  15 mL Mouth Rinse BID  . clopidogrel  75 mg Oral Q breakfast  . pantoprazole (PROTONIX) IV  40 mg Intravenous Daily   PRN:  acetaminophen **OR** acetaminophen, albuterol, alteplase, aspirin, clopidogrel, ondansetron  (ZOFRAN) IV  Diet:  Diet NPO time specified  Activity:  Bedrest DVT Prophylaxis:  SCD  CLINICALLY SIGNIFICANT STUDIES Basic Metabolic Panel:  Recent Labs Lab 05/23/14 0158 05/24/14 0354  NA 138 137  K 4.3 3.6  CL 103 108  CO2 24 17*  GLUCOSE 137* 142*  BUN 19 15  CREATININE 0.91 0.95  CALCIUM 9.0 7.9*   Liver Function Tests:  Recent Labs Lab 05/23/14 0158  AST 22  ALT 28  ALKPHOS 52  BILITOT 0.6  PROT 7.1  ALBUMIN 3.7   CBC:  Recent Labs Lab 05/23/14 0158 05/24/14 0354  WBC 8.5 11.0*  NEUTROABS 6.5 7.7  HGB 13.9 10.9*  HCT 42.3 34.1*  MCV 85.5 85.9  PLT 226 218   Coagulation:  Recent Labs Lab 05/23/14 0158  LABPROT 13.9  INR 1.06   Cardiac Enzymes:  Recent Labs Lab 05/23/14 0158  TROPONINI <0.03   Urinalysis:  Recent Labs Lab 05/23/14 0428  COLORURINE YELLOW  LABSPEC 1.029  PHURINE 5.5  GLUCOSEU NEGATIVE  HGBUR NEGATIVE  BILIRUBINUR NEGATIVE  KETONESUR NEGATIVE  PROTEINUR NEGATIVE  UROBILINOGEN 0.2  NITRITE NEGATIVE  LEUKOCYTESUR NEGATIVE   Lipid Panel    Component Value Date/Time   CHOL 110 05/24/2014 0354   TRIG 79 05/24/2014 0354   HDL 35* 05/24/2014 0354   CHOLHDL 3.1 05/24/2014 0354  VLDL 16 05/24/2014 0354   LDLCALC 59 05/24/2014 0354   HgbA1C No results found for: HGBA1C  Urine Drug Screen:     Component Value Date/Time   LABOPIA NONE DETECTED 05/23/2014 0428   COCAINSCRNUR NONE DETECTED 05/23/2014 0428   LABBENZ NONE DETECTED 05/23/2014 0428   AMPHETMU NONE DETECTED 05/23/2014 0428   THCU NONE DETECTED 05/23/2014 0428   LABBARB NONE DETECTED 05/23/2014 0428    Alcohol Level:  Recent Labs Lab 05/23/14 0158  ETH <5    Ct Angio Head W/cm &/or Wo Cm  05/23/2014   CLINICAL DATA:  Initial evaluation for acute syncope, right-sided weakness.  EXAM: CT ANGIOGRAPHY HEAD  TECHNIQUE: Multidetector CT imaging of the head was performed using the standard protocol during bolus administration of intravenous contrast.  Multiplanar CT image reconstructions and MIPs were obtained to evaluate the vascular anatomy.  CONTRAST:  120mL OMNIPAQUE IOHEXOL 350 MG/ML SOLN  COMPARISON:  Prior CT from earlier the same day.  FINDINGS: CTA HEAD  Study is degraded by motion artifact.  Anterior circulation: Visualized portion of the distal segment of the right internal carotid artery is widely patent. The petrous, cavernous and supraclinoid segments of the right ICA are widely patent. Minimal calcified plaque present within the right cavernous segment without significant stenosis.  The left internal carotid artery is not visualized within the distal neck, likely occluded proximally. The left ICA remains occluded at its petrous and cavernous segments. There is some distal reconstitution at the supraclinoid segment, likely via contralateral flow cross the circle of Bastian Andreoli. The A1 segments and anterior communicating artery are widely patent. A anterior cerebral arteries are patent bilaterally, although poorly evaluated distally due to motion artifact.  There is somewhat thready an irregular flow within the left M1 segment, which may in part be related to motion artifact. There is question of superimposed focal moderate to severe stenosis within the distal left M1/ proximal left M2 segment at the base of the sylvian fissure. The distal left MCA branches are opacified.  Right M1 segment and its distal branches are opacified.  Posterior circulation: Right vertebral artery is dominant and widely patent to the vertebrobasilar junction. The somewhat diminutive left vertebral artery patent as well. Posterior inferior cerebral arteries are patent bilaterally. Basilar artery is widely patent. Superior cerebellar arteries patent proximally. Posterior cerebral arteries are opacified bilaterally without high-grade stenosis or occlusion. A small left posterior communicating artery is present.  Venous sinuses: No abnormality identified within the venous sinuses.   Anatomic variants: No anatomic variant.  Delayed phase:No abnormal enhancement on delayed sequences. Atrophy with chronic microvascular ischemic disease noted.  IMPRESSION: 1. Nonvisualization of the left internal carotid artery, occluded proximally within the neck. There is distal reconstitution at the supraclinoid segment of the left ICA via contralateral flow across the circle of Leigh Kaeding. Flow within the left M1 segment is present but somewhat thready and irregular in appearance, which may in part be related to motion artifact on this exam. There is question of a superimposed focal moderate to severe stenosis and/or partially occlusive thrombus within the distal left M1/ proximal M2 segment near the base of the sylvian fissure. CTA imaging of the neck is suggested to evaluate the extent of the left ICA occlusion. 2. Widely patent right internal carotid artery system. 3. Widely patent vertebrobasilar system. A small left posterior communicating artery is present. Critical Value/emergent results were called by telephone at the time of interpretation on 05/23/2014 at 3:51 am to Dr. Rolland Porter , who  verbally acknowledged these results.   Electronically Signed   By: Jeannine Boga M.D.   On: 05/23/2014 03:58   Ct Head Wo Contrast  05/23/2014   CLINICAL DATA:  80 year old male post left internal carotid artery occlusion post endovascular stenting. Subsequent encounter.  EXAM: CT HEAD WITHOUT CONTRAST  TECHNIQUE: Contiguous axial images were obtained from the base of the skull through the vertex without intravenous contrast.  COMPARISON:  05/23/2014.  FINDINGS: Post angiography without evidence of intracranial hemorrhage or intracranial enhancing lesion.  Indistinctness left subinsular region, left basal ganglia and portions of the left corona radiata/left frontal lobe may represent result of acute infarct.  Prominent small vessel disease type changes.  Atrophy without hydrocephalus.  Ethmoid sinus air cell mucosal  thickening.  Exophthalmos.  IMPRESSION: Post angiography without evidence of intracranial hemorrhage or intracranial enhancing lesion.  Indistinctness left subinsular region, left basal ganglia and portions of the left corona radiata/left frontal lobe may represent result of acute infarct.  Prominent small vessel disease type changes.   Electronically Signed   By: Genia Del M.D.   On: 05/23/2014 10:53   Ct Head Wo Contrast  05/23/2014   CLINICAL DATA:  New right-sided weakness.  Unresponsive.  A aphasia.  EXAM: CT HEAD WITHOUT CONTRAST  TECHNIQUE: Contiguous axial images were obtained from the base of the skull through the vertex without intravenous contrast.  COMPARISON:  CT head without contrast 12/29/2013.  FINDINGS: Atrophy and extensive subcortical white matter disease is not significantly changed. No acute cortical infarct is present. The basal ganglia are intact. The ventricles are proportionate to the degree of atrophy.  Paranasal sinuses and mastoid air cells are clear. The calvarium is intact. No significant extra-axial fluid collection is present.  IMPRESSION: 1. No acute intracranial abnormality or significant interval change. 2. Stable atrophy and diffuse white matter disease. This likely reflects the sequela of chronic microvascular ischemia. These results were called by telephone at the time of interpretation on 05/23/2014 at 2:06 am to Dr. Rolland Porter , who verbally acknowledged these results.   Electronically Signed   By: San Morelle M.D.   On: 05/23/2014 02:07   Dg Epidurography  05/22/2014   CLINICAL DATA:  Lumbosacral spondylosis without myelopathy. LEFT leg radicular symptoms.  DDD (degenerative disc disease), lumbar M51.36 (ICD-10-CM)  Subsequent encounter.  EXAM: LUMBAR INTERLAMINAR EPIDURAL INJECTION  FLUOROSCOPY TIME:  8 seconds corresponding to a Dose Area Product of 20 Gy*m2  PROCEDURE: Procedure: Informed written consent was obtained. Time-out was performed.  We discussed  the moderate likelihood of moderate lasting relief/attainment of therapeutic goal. The overlying skin was cleansed with betadine soap and anesthetized with 1% lidocaine without epinephrine. An interlaminar approach was performed at L4-5 on the LEFT. 20 gauge needle was advanced using loss-of-resistance technique.  DIAGNOSTIC/ THERAPEUTIC EPIDURAL INJECTION: Injection of Omnipaque 180 shows a good epidural pattern with spread above and below the level of needle placement, primarily on the side of needle placement. No vascular or subarachnoid opacification was seen. 120 mg of Depo-Medrol mixed with 5 cc of 1% Lidocaine were instilled. The procedure was well-tolerated, and the patient was discharged thirty minutes following the injection in good condition.  IMPRESSION: Technically successful LEFT L4-5 lumbar interlaminar epidural injection.   Electronically Signed   By: Rolla Flatten M.D.   On: 05/22/2014 12:22   Ct Cerebral Perfusion W/cm  05/23/2014   CLINICAL DATA:  80 year old male with history of left ICA occlusion, evaluate for stroke.  EXAM: CT CEREBRAL PERFUSION  WITH CONTRAST  CONTRAST:  91mL OMNIPAQUE IOHEXOL 350 MG/ML SOLN  COMPARISON:  Comparison made with prior CTA performed earlier the same day.  FINDINGS: Dedicated cerebral perfusion maps demonstrate a focal area of slightly decreased cerebral blood volume and cerebral blood flow with elevated time to peak, compatible with core infarct in the anterior left frontal lobe. Adjacent to this, there are scattered area is of elevated is time to peak with relatively preserved cerebral blood volume and cerebral blood flow, compatible with ischemic penumbra within the left MCA territory.  IMPRESSION: 1. Area of ischemic core infarct within the left MCA territory, most prevalent within the anterior left frontal lobe. 2. Adjacent areas of elevated time to peak with relatively preserved CBV in the left MCA territory. Finding suspicious for ischemic penumbra. Size of  ischemic penumbra overall approximately 1/3 the size of affected area. Critical Value/emergent results were called by telephone at the time of interpretation on 05/23/2014 at 5:43 am to Dr. Jim Like , who verbally acknowledged these results.   Electronically Signed   By: Jeannine Boga M.D.   On: 05/23/2014 05:45   Dg Chest Port 1 View  05/23/2014   CLINICAL DATA:  Respiratory failure  EXAM: PORTABLE CHEST - 1 VIEW  COMPARISON:  12/29/2013  FINDINGS: Borderline cardiomegaly. Central mild vascular congestion and mild perihilar increased bronchial markings. No convincing pulmonary edema. Mild basilar atelectasis without segmental infiltrate. Endotracheal tube in place with tip 3.3 cm above the carina. NG tube in place.  IMPRESSION: Central mild vascular congestion and mild perihilar increased bronchial markings. No convincing pulmonary edema. Mild basilar atelectasis without segmental infiltrate. Endotracheal tube in place with tip 3.3 cm above the carina. NG tube in place.   Electronically Signed   By: Lahoma Crocker M.D.   On: 05/23/2014 11:31    CT of the brain   IMPRESSION: Post angiography without evidence of intracranial hemorrhage or intracranial enhancing lesion.  Indistinctness left subinsular region, left basal ganglia and portions of the left corona radiata/left frontal lobe may represent result of acute infarct.  Prominent small vessel disease type changes.  MRI of the brain   Pending MRA of the brain    2D Echocardiogram   Study Conclusions  - Left ventricle: The cavity size was normal. Wall thickness was increased in a pattern of mild LVH. Systolic function was vigorous. The estimated ejection fraction was in the range of 65% to 70%. Wall motion was normal; there were no regional wall motion abnormalities. Doppler parameters are consistent with abnormal left ventricular relaxation (grade 1 diastolic dysfunction). - Aortic valve: There was no stenosis. -  Mitral valve: Mildly calcified annulus. There was no significant regurgitation. - Left atrium: The atrium was mildly dilated. - Right ventricle: The cavity size was normal. Systolic function was normal. - Pulmonary arteries: PA peak pressure: 35 mm Hg (S). - Systemic veins: IVC measured 2.0 cm with < 50% respirophasic variation, suggesting RA pressure 8 mmHg.  Impressions:  - Normal LV size with mild LV hypertrophy. EF 65-70%. Normal RV size and systolic function. No significant valvular abnormalities. Carotid Doppler   Pending CXR   IMPRESSION: Central mild vascular congestion and mild perihilar increased bronchial markings. No convincing pulmonary edema. Mild basilar atelectasis without segmental infiltrate. Endotracheal tube in place with tip 3.3 cm above the carina. NG tube in place. EKG  Sinus rhythm LOW VOLTAGE Precordial leads Otherwise within normal limits No significant change since last tracing 29 Dec 2013  Therapy Recommendations pending  Physical Exam  General: The patient is intubated, sedated at the time of examination.  Respiratory: Clear lung fields  Cardiovascular examination: Regular rate and rhythm, no murmurs  Abdomen: Soft, nontender  Skin: No significant peripheral edema is noted.   Neurologic Exam  Mental status: The patient is sedated, unresponsive.   Cranial nerves: Facial symmetry is present. The patient is intubated. Eyes are mid position, no blink to threat. Pupils are equal, trace reactive.  Motor: The patient has symmetric motor tone all fours, decreased  Sensory examination: No response to deep pain stimulation all fours.  Coordination: The patient is unable to cooperate for cerebellar testing.  Gait and station: The gait cannot be tested.  Reflexes: Deep tendon reflexes are symmetric. Toes are neutral.    ASSESSMENT Mr. Henry [name] is a 80 y.o. male presenting with a right hemiparesis, the patient has an  occluded left internal carotid artery, left middle cerebral. The patient required interventional procedure, given the results of the perfusion scan. The patient has had stenting of the left internal carotid artery. The patient remains intubated, on the ventilator. The patient is sedated.   Left middle cerebral artery, carotid artery distribution stroke  Hypertension  Dyslipidemia  Hospital day # 1  TREATMENT/PLAN  Plans for extubation later today  MRI of the brain following extubation, hopefully today.  Physical, occupational, speech therapy when appropriate  Supportive care, critical care medicine following  Aspirin and Plavix therapy   Gracelyn Coventry KEITH  05/24/2014 9:35 AM  [phone]

## 2014-05-25 ENCOUNTER — Inpatient Hospital Stay (HOSPITAL_COMMUNITY): Payer: Medicare Other

## 2014-05-25 DIAGNOSIS — Q251 Coarctation of aorta: Secondary | ICD-10-CM

## 2014-05-25 DIAGNOSIS — R4701 Aphasia: Secondary | ICD-10-CM

## 2014-05-25 DIAGNOSIS — E785 Hyperlipidemia, unspecified: Secondary | ICD-10-CM

## 2014-05-25 DIAGNOSIS — M6289 Other specified disorders of muscle: Secondary | ICD-10-CM

## 2014-05-25 DIAGNOSIS — I63132 Cerebral infarction due to embolism of left carotid artery: Secondary | ICD-10-CM

## 2014-05-25 DIAGNOSIS — I10 Essential (primary) hypertension: Secondary | ICD-10-CM

## 2014-05-25 DIAGNOSIS — J9690 Respiratory failure, unspecified, unspecified whether with hypoxia or hypercapnia: Secondary | ICD-10-CM | POA: Insufficient documentation

## 2014-05-25 DIAGNOSIS — I639 Cerebral infarction, unspecified: Secondary | ICD-10-CM

## 2014-05-25 LAB — BASIC METABOLIC PANEL
Anion gap: 3 — ABNORMAL LOW (ref 5–15)
BUN: 17 mg/dL (ref 6–23)
CO2: 23 mmol/L (ref 19–32)
CREATININE: 0.88 mg/dL (ref 0.50–1.35)
Calcium: 8.2 mg/dL — ABNORMAL LOW (ref 8.4–10.5)
Chloride: 113 mmol/L — ABNORMAL HIGH (ref 96–112)
GFR, EST NON AFRICAN AMERICAN: 81 mL/min — AB (ref >90)
Glucose, Bld: 105 mg/dL — ABNORMAL HIGH (ref 70–99)
Potassium: 3.8 mmol/L (ref 3.5–5.1)
Sodium: 139 mmol/L (ref 135–145)

## 2014-05-25 LAB — HEMOGLOBIN A1C
Hgb A1c MFr Bld: 6.2 % — ABNORMAL HIGH (ref 4.8–5.6)
MEAN PLASMA GLUCOSE: 131 mg/dL

## 2014-05-25 LAB — PLATELET INHIBITION P2Y12: PLATELET FUNCTION P2Y12: 144 [PRU] — AB (ref 194–418)

## 2014-05-25 MED ORDER — ATORVASTATIN CALCIUM 40 MG PO TABS
40.0000 mg | ORAL_TABLET | Freq: Every evening | ORAL | Status: DC
  Administered 2014-05-26: 40 mg via ORAL
  Filled 2014-05-25 (×2): qty 1

## 2014-05-25 MED ORDER — ENOXAPARIN SODIUM 40 MG/0.4ML ~~LOC~~ SOLN
40.0000 mg | SUBCUTANEOUS | Status: DC
  Administered 2014-05-25 – 2014-05-26 (×2): 40 mg via SUBCUTANEOUS
  Filled 2014-05-25 (×2): qty 0.4

## 2014-05-25 NOTE — Progress Notes (Signed)
P2y12 is 144 today this has been discussed with Dr. Estanislado Pandy and he recommends to continue ASA 325mg  daily and plavix 75 mg daily. Will repeat p2y12 tomorrow.   Tsosie Billing PA-C Interventional Radiology  05/25/14  11:08 AM

## 2014-05-25 NOTE — Evaluation (Signed)
Speech Language Pathology Evaluation Patient Details Name: Henry [name] MRN: [id number] DOB: [birth year] Today's Date: 05/25/2014 Time: 5400-8676 SLP Time Calculation (min) (ACUTE ONLY): 18 min  Problem List:  Patient Active Problem List   Diagnosis Date Noted  . Acute right-sided weakness   . Stroke 05/23/2014  . S/P shoulder surgery 12/24/2012  . S/P arthroscopy of shoulder 12/16/2012  . Shoulder arthritis 12/13/2012  . Biceps tendon rupture, proximal 12/13/2012  . Partial tear of right subscapularis tendon 12/13/2012  . Hypertension 05/10/2011  . Hyperlipidemia 05/10/2011  . Barrett esophagus 05/10/2011  . Esophagitis 05/10/2011  . FOOT SPRAIN, RIGHT 07/18/2007   Past Medical History:  Past Medical History  Diagnosis Date  . Hypertension   . GERD (gastroesophageal reflux disease)   . Hypercholesteremia   . HOH (hard of hearing)    Past Surgical History:  Past Surgical History  Procedure Laterality Date  . Shoulder surgery Right     torn cartiledge  . Esophagogastroduodenoscopy  10/28/2010    Procedure: ESOPHAGOGASTRODUODENOSCOPY (EGD);  Surgeon: [name], MD;  Location: [facility];  Service: Endoscopy;  Laterality: N/A;  10:30  . Colonoscopy    . Colonoscopy  05/17/2011    Procedure: COLONOSCOPY;  Surgeon: [name], MD;  Location: [facility];  Service: Endoscopy;  Laterality: N/A;  830  . Resection distal clavical Right 12/13/2012    Procedure: RESECTION DISTAL CLAVICAL;  Surgeon: Carole Civil, MD;  Location: [facility];  Service: Orthopedics;  Laterality: Right;  . Shoulder arthroscopy with bicepstenotomy Right 12/13/2012    Procedure: SHOULDER ARTHROSCOPY WITH BICEPS TENOTOMY, limited shoulder debridement;  Surgeon: Carole Civil, MD;  Location: [facility];  Service: Orthopedics;  Laterality: Right;  . Esophagogastroduodenoscopy N/A 04/24/2014    Procedure: ESOPHAGOGASTRODUODENOSCOPY (EGD);  Surgeon: [name], MD;  Location: [facility];  Service: Endoscopy;  Laterality: N/A;  44   HPI:  80 y.o. male hx of HTN, HLD, GERD, HOH admitted with syncopal episode, aphasia and right sided weakness. Per MD report findings pertinent for nonvisualization of the left ICA, occluded proximally within the neck. MRI multi small areas of acute ischemia LEFT middle cerebral artery territory. Small vascular flow void, less likely micro hemorrhage LEFT posterior frontal lobe without lobar hematoma. Intubated 4/9-4/11 am. CXR low lung volumes, with airspace and interstitial opacities potentially reflecting atelectasis and/ or consolidation.   Assessment / Plan / Recommendation Clinical Impression  Pt exhibits deficits affecting motor speech, comprehension and expressive language resulting in decreased vocal intensity, breathy vocal quality, oral/verbal apraxia, decreased ability to follow simple 2 step commands.  Expressive language at the phrase level includes neologisms, phonemic paraphasias without evidence of awareness or frustration. Pt identified single words in field of two with 40% accuracy. Ongoing ST to facilitate communicative and further diagnostic treatment of cognition recommended in acute and inpatient rehab.     SLP Assessment  Patient needs continued Speech Lanaguage Pathology Services    Follow Up Recommendations  Inpatient Rehab    Frequency and Duration min 2x/week  2 weeks   Pertinent Vitals/Pain Pain Assessment: No/denies pain   SLP Goals  Potential to Achieve Goals (ACUTE ONLY): Good  SLP Evaluation Prior Functioning  Cognitive/Linguistic Baseline: Within functional limits Type of Home: House  Lives With: Spouse Available Help at Discharge: Family;Available 24 hours/day   Cognition  Overall Cognitive Status: Impaired/Different from baseline Arousal/Alertness: Awake/alert Orientation Level:  (inaccurate with y/n ?'s due to aphasia) Attention: Sustained Sustained Attention: Appears intact  Memory:  (will  assess in diagnostic treatment) Awareness: Impaired Awareness Impairment: Intellectual impairment;Emergent impairment;Anticipatory impairment (did not exhibit frustration) Problem Solving:  (will asssess further) Safety/Judgment: Impaired Comments:  Research scientist (life sciences) states pt attempting to get OOB)    Comprehension  Auditory Comprehension Overall Auditory Comprehension: Impaired Yes/No Questions: Impaired Basic Biographical Questions: 51-75% accurate (biographical and environmental) Commands: Impaired Two Step Basic Commands: 25-49% accurate EffectiveTechniques: Repetition Visual Recognition/Discrimination Discrimination: Not tested Reading Comprehension Reading Status: Impaired Word level:  (90) Sentence Level:  (90%) Paragraph Level: Not tested Interfering Components: Right neglect/inattention (possible impulsivity)    Expression Expression Primary Mode of Expression: Verbal Verbal Expression Overall Verbal Expression: Impaired Initiation: No impairment Automatic Speech:  (little verbalization and lip approximation) Level of Generative/Spontaneous Verbalization: Phrase Repetition: Impaired Level of Impairment: Word level Naming: Impairment Responsive: Not tested Confrontation: Impaired (20% accuracy common objects) Verbal Errors: Phonemic paraphasias;Perseveration;Not aware of errors;Semantic paraphasias Pragmatics: No impairment Written Expression Dominant Hand: Right Written Expression: Exceptions to [facility] Self Formulation Ability: Letter;Word   Oral / Motor Oral Motor/Sensory Function Overall Oral Motor/Sensory Function: Impaired Labial ROM: Reduced right Labial Symmetry: Abnormal symmetry right Labial Strength: Reduced Lingual ROM: Reduced left Lingual Symmetry: Abnormal symmetry right Lingual Strength: Reduced Facial ROM: Reduced right Motor Speech Overall Motor Speech: Impaired Respiration: Within functional limits Phonation: Breathy;Low vocal intensity Resonance:  Within functional limits Articulation: Impaired Level of Impairment: Word Intelligibility: Intelligibility reduced Word: 25-49% accurate Phrase: 0-24% accurate Motor Planning: Impaired Level of Impairment: Phrase Motor Speech Errors: Unaware   GO     Houston Siren 05/25/2014, 3:54 PM   Orbie Pyo Jayel Inks M.Ed [employer] [phone]

## 2014-05-25 NOTE — Procedures (Signed)
Extubation Procedure Note  Patient Details:   Name: Henry [name] DOB: 1934-10-01 MRN: [id number]   Airway Documentation:     Evaluation  O2 sats: stable throughout Complications: No apparent complications Patient did tolerate procedure well. Bilateral Breath Sounds: Diminished Suctioning: Airway Yes  Patient tolerated wean. MD ordered to extubate. Positive for cuff leak. Patient extubated to a 4 Lpm nasal cannula. No signs of dyspnea or stridor. RN at bedside. Patient resting comfortably.   Myrtie Neither 05/25/2014, 10:25 AM

## 2014-05-25 NOTE — Progress Notes (Signed)
*  PRELIMINARY RESULTS* Vascular Ultrasound Lower extremity venous duplex has been completed.  Preliminary findings: No DVT noted in visualized veins.    Landry Mellow, RDMS, RVT  05/25/2014, 1:51 PM

## 2014-05-25 NOTE — Evaluation (Signed)
Occupational Therapy Evaluation Patient Details Name: Henry [name] MRN: [id number] DOB: 02-18-1934 Today's Date: 05/25/2014    History of Present Illness Patient is a 80 y/o male admitted with syncope and right sided weakness. CT: Left MCA infarct and s/p complete revascularization of acutely occluded Lt ICA . PMHx:HTN   Clinical Impression   This 80 yo male admitted with above presents to acute OT with impulsiveness, decreased use of RUE, decreased cognition, decreased mobility, and decreased balance, with possible visual issues all affecting his PLOF to being independent with BADLs and IADLs. He will benefit from acute OT with follow up on CIR to get to a S level or better.    Follow Up Recommendations  CIR    Equipment Recommendations   (TBD next venue)       Precautions / Restrictions Precautions Precautions: Fall Precaution Comments: h/o dizziness in standing per family (?vestibular versus orthostatic issues) Restrictions Weight Bearing Restrictions: No      Mobility Bed Mobility Overal bed mobility: Needs Assistance Bed Mobility: Supine to Sit     Supine to sit: Mod assist;+2 for safety/equipment     General bed mobility comments: +another to assist with lines  Transfers Overall transfer level: Needs assistance Equipment used: 2 person hand held assist Transfers: Sit to/from Stand Sit to Stand: Min assist;+2 safety/equipment         General transfer comment: assist for safety/balance; pt initially impulsive requiring verbal, tactile, gesturing cues for safety    Balance Overall balance assessment: Needs assistance Sitting-balance support: No upper extremity supported;Feet supported Sitting balance-Leahy Scale: Good Sitting balance - Comments: reaches to feet and anteriorly about 8"   Standing balance support: Bilateral upper extremity supported Standing balance-Leahy Scale: Poor Standing balance comment: grossly unstable, provided UE support for  standing activities                            ADL Overall ADL's : Needs assistance/impaired Eating/Feeding: NPO   Grooming: Moderate assistance;Sitting (with mod cues)   Upper Body Bathing: Minimal assitance;Sitting (with mod cues )   Lower Body Bathing: Moderate assistance (with +2 min A sit<>stand, mod cues )   Upper Body Dressing : Moderate assistance (with mod cues)   Lower Body Dressing: Maximal assistance (with +2 min A sit<>stand; mod cues)   Toilet Transfer: +2 for physical assistance;Ambulation;Minimal assistance (Bil HHA) Toilet Transfer Details (indicate cue type and reason): Bed> out door with eventual sitting in recliner behind him Toileting- Clothing Manipulation and Hygiene:  (mod cues)               Vision Vision Assessment?: Vision impaired- to be further tested in functional context Additional Comments: wears glasses (bifocals)          Pertinent Vitals/Pain Pain Assessment: No/denies pain     Hand Dominance Right   Extremity/Trunk Assessment Upper Extremity Assessment Upper Extremity Assessment: RUE deficits/detail RUE Deficits / Details: moves slower than the left RUE Coordination: decreased fine motor;decreased gross motor   Lower Extremity Assessment Lower Extremity Assessment: RLE deficits/detail RLE Deficits / Details: moves antigravity; slower to respond in testing ankle DF       Communication Communication Communication: Receptive difficulties;Expressive difficulties   Cognition Arousal/Alertness: Awake/alert Behavior During Therapy: Impulsive Overall Cognitive Status: Impaired/Different from baseline Area of Impairment: Orientation;Attention;Following commands;Safety/judgement;Problem solving Orientation Level:  (intially nodded yes to being born in Jan, but then with time chose Sept. Nodded yes to 2014) Current Attention Level:  Sustained   Following Commands: Follows one step commands inconsistently;Follows one step  commands with increased time Safety/Judgement: Decreased awareness of safety;Decreased awareness of deficits   Problem Solving: Requires verbal cues;Requires tactile cues                Home Living Family/patient expects to be discharged to:: Private residence Living Arrangements: Spouse/significant other Available Help at Discharge: Family;Available 24 hours/day Type of Home: House Home Access: Stairs to enter [employer] of Steps: 1 Entrance Stairs-Rails: None Home Layout: One level     Bathroom Shower/Tub: Tub/shower unit;Curtain Shower/tub characteristics: Architectural technologist: Standard     Home Equipment: None      Lives With: Spouse    Prior Functioning/Environment Level of Independence: Independent        Comments: loves to golf. Retired Nature conservation officer    OT Diagnosis: Generalized weakness;Cognitive deficits;Disturbance of vision;Hemiplegia dominant side   OT Problem List: Decreased strength;Impaired balance (sitting and/or standing);Decreased safety awareness;Decreased cognition;Impaired UE functional use;Decreased coordination;Obesity;Impaired vision/perception;Decreased knowledge of use of DME or AE   OT Treatment/Interventions: Self-care/ADL training;Therapeutic exercise;Therapeutic activities;DME and/or AE instruction;Cognitive remediation/compensation;Balance training;Patient/family education;Visual/perceptual remediation/compensation    OT Goals(Current goals can be found in the care plan section) Acute Rehab OT Goals Patient Stated Goal: family: to get him to rehab OT Goal Formulation: With family Time For Goal Achievement: 06/01/14 Potential to Achieve Goals: Good  OT Frequency: Min 3X/week           Co-evaluation PT/OT/SLP Co-Evaluation/Treatment: Yes Reason for Co-Treatment: For patient/therapist safety PT goals addressed during session: Balance;Mobility/safety with mobility OT goals addressed during session: Strengthening/ROM       End of Session Equipment Utilized During Treatment: Gait belt;Rolling walker Nurse Communication: Mobility status  Activity Tolerance: Patient tolerated treatment well Patient left: in chair;with call bell/phone within reach;with family/visitor present   Time: 1191-4782 OT Time Calculation (min): 32 min Charges:  OT General Charges $OT Visit: 1 Procedure OT Evaluation $Initial OT Evaluation Tier I: 1 Procedure  Almon Register [phone] 05/25/2014, 3:57 PM

## 2014-05-25 NOTE — Progress Notes (Signed)
Speech Language Pathology   Patient Details Name: Henry [name] MRN: [id number] DOB: 08/09/1934 Today's Date: 05/25/2014 Time:  -        Pt currently on ventilator. RN reported hopefull extubation soon. ST will follow along.     Orbie Pyo [place].Ed [employer] [phone]

## 2014-05-25 NOTE — Progress Notes (Signed)
STROKE TEAM PROGRESS NOTE   SUBJECTIVE (INTERVAL HISTORY) His wife and daughters are at the bedside.  Overall he feels his condition is rapidly improving. He was extubated today and was sitting in chair due to around. He still had difficulty with speech, but right upper and lower extremity muscle strength much improved.   OBJECTIVE Temp:  [97.8 F (36.6 C)-100 F (37.8 C)] 98.7 F (37.1 C) (04/11 1546) Pulse Rate:  [38-82] 54 (04/11 1611) Cardiac Rhythm:  [-] Sinus bradycardia (04/11 1500) Resp:  [14-26] 19 (04/11 1611) BP: (95-135)/(43-97) 122/64 mmHg (04/11 1611) SpO2:  [93 %-100 %] 95 % (04/11 1611) Arterial Line BP: (104-170)/(50-81) 105/60 mmHg (04/11 1200) FiO2 (%):  [40 %] 40 % (04/11 0830)   Recent Labs Lab 05/23/14 0545  GLUCAP 135*    Recent Labs Lab 05/23/14 0158 05/24/14 0354 05/25/14 0350  NA 138 137 139  K 4.3 3.6 3.8  CL 103 108 113*  CO2 24 17* 23  GLUCOSE 137* 142* 105*  BUN 19 15 17   CREATININE 0.91 0.95 0.88  CALCIUM 9.0 7.9* 8.2*    Recent Labs Lab 05/23/14 0158  AST 22  ALT 28  ALKPHOS 52  BILITOT 0.6  PROT 7.1  ALBUMIN 3.7    Recent Labs Lab 05/23/14 0158 05/24/14 0354  WBC 8.5 11.0*  NEUTROABS 6.5 7.7  HGB 13.9 10.9*  HCT 42.3 34.1*  MCV 85.5 85.9  PLT 226 218    Recent Labs Lab 05/23/14 0158  TROPONINI <0.03    Recent Labs  05/23/14 0158  LABPROT 13.9  INR 1.06    Recent Labs  05/23/14 0428  COLORURINE YELLOW  LABSPEC 1.029  PHURINE 5.5  GLUCOSEU NEGATIVE  HGBUR NEGATIVE  BILIRUBINUR NEGATIVE  KETONESUR NEGATIVE  PROTEINUR NEGATIVE  UROBILINOGEN 0.2  NITRITE NEGATIVE  LEUKOCYTESUR NEGATIVE       Component Value Date/Time   CHOL 110 05/24/2014 0354   TRIG 79 05/24/2014 0354   HDL 35* 05/24/2014 0354   CHOLHDL 3.1 05/24/2014 0354   VLDL 16 05/24/2014 0354   LDLCALC 59 05/24/2014 0354   Lab Results  Component Value Date   HGBA1C 6.2* 05/24/2014      Component Value Date/Time   LABOPIA NONE  DETECTED 05/23/2014 0428   COCAINSCRNUR NONE DETECTED 05/23/2014 0428   LABBENZ NONE DETECTED 05/23/2014 0428   AMPHETMU NONE DETECTED 05/23/2014 0428   THCU NONE DETECTED 05/23/2014 0428   LABBARB NONE DETECTED 05/23/2014 0428     Recent Labs Lab 05/23/14 0158  ETH <5    I have personally reviewed the radiological images below and agree with the radiology interpretations.  Ct Angio Head W/cm &/or Wo Cm  05/23/2014   IMPRESSION: 1. Nonvisualization of the left internal carotid artery, occluded proximally within the neck. There is distal reconstitution at the supraclinoid segment of the left ICA via contralateral flow across the circle of Willis. Flow within the left M1 segment is present but somewhat thready and irregular in appearance, which may in part be related to motion artifact on this exam. There is question of a superimposed focal moderate to severe stenosis and/or partially occlusive thrombus within the distal left M1/ proximal M2 segment near the base of the sylvian fissure. CTA imaging of the neck is suggested to evaluate the extent of the left ICA occlusion. 2. Widely patent right internal carotid artery system. 3. Widely patent vertebrobasilar system. A small left posterior communicating artery is present.    Ct Head Wo Contrast  05/23/2014  IMPRESSION: Post angiography without evidence of intracranial hemorrhage or intracranial enhancing lesion.  Indistinctness left subinsular region, left basal ganglia and portions of the left corona radiata/left frontal lobe may represent result of acute infarct.  Prominent small vessel disease type changes.     05/23/2014   IMPRESSION: 1. No acute intracranial abnormality or significant interval change. 2. Stable atrophy and diffuse white matter disease. This likely reflects the sequela of chronic microvascular ischemia.   Ct Cerebral Perfusion W/cm  05/23/2014   IMPRESSION: 1. Area of ischemic core infarct within the left MCA territory, most  prevalent within the anterior left frontal lobe. 2. Adjacent areas of elevated time to peak with relatively preserved CBV in the left MCA territory. Finding suspicious for ischemic penumbra. Size of ischemic penumbra overall approximately 1/3 the size of affected area.   Dg Chest Port 1 View  05/25/2014   IMPRESSION: Endotracheal tube similar to comparison, terminating approximately 2.9 cm above the carina. Unchanged gastric tube.  Low lung volumes, with airspace and interstitial opacities potentially reflecting atelectasis and/ or consolidation.  Widened mediastinum, accentuated by the rotation of the patient. If there were concern for acute mediastinal or vascular abnormality, CT would be advised.     Dg Chest Port 1 View  05/23/2014   IMPRESSION: Central mild vascular congestion and mild perihilar increased bronchial markings. No convincing pulmonary edema. Mild basilar atelectasis without segmental infiltrate. Endotracheal tube in place with tip 3.3 cm above the carina. NG tube in place.      Mri and Mra Head/brain Wo Cm  05/24/2014   IMPRESSION: MRI HEAD: Multi small areas of acute ischemia LEFT middle cerebral artery territory. Small vascular flow void, less likely micro hemorrhage LEFT posterior frontal lobe without lobar hematoma.  Involutional changes. Moderate to severe white matter changes suggest chronic small vessel ischemic disease.  Findings suggests of normal pressure hydrocephalus.  MRA HEAD: Recannulized LEFT internal carotid artery. Normal MRA of the head, complete circle of Willis.     Carotid Doppler  Right: ECA stenosis. 40-59% internal carotid artery stenosis by velocities. But ICA/CCA ratio of 3.07 suggests stenosis in the 60-79% range. Left: <50% stenosis in the stent. Bilateral: Vertebral artery flow is antegrade.  2D Echocardiogram  - Normal LV size with mild LV hypertrophy. EF 65-70%. Normal RV size and systolic function. No significant  valvular abnormalities.  LE venous doppler - negative for DVT   PHYSICAL EXAM  Temp:  [97.8 F (36.6 C)-100 F (37.8 C)] 98.7 F (37.1 C) (04/11 1546) Pulse Rate:  [38-82] 54 (04/11 1611) Resp:  [14-26] 19 (04/11 1611) BP: (95-135)/(43-97) 122/64 mmHg (04/11 1611) SpO2:  [93 %-100 %] 95 % (04/11 1611) Arterial Line BP: (104-170)/(50-81) 105/60 mmHg (04/11 1200) FiO2 (%):  [40 %] 40 % (04/11 0830)  General - Well nourished, well developed, in no apparent distress, sitting in chair.  Ophthalmologic - Sharp disc margins OU.  Cardiovascular - Regular rate and rhythm with no murmur.  Neck - supple, no carotid bruits  Mental Status -  Awake, alert, sitting in chair, severe dysarthria with intelligible words, expressive aphasia, able to follow some simple commands, not complex commands. Moderate perseveration.  Cranial Nerves II - XII - II - blinking to visual threat bilaterally. III, IV, VI - Extraocular movements intact. V - Facial sensation not cooperative on exam. VII - Facial movement intact bilaterally, but mild right nasolabial fold flattening. VIII - Hearing & vestibular intact bilaterally. X - not cooperative on exam. XI - not  cooperative on exam. XII - Tongue protrusion intact.  Motor Strength - The patient's strength was 4/5 right upper extremity and pronator drift was present on the right. 4/5 right lower extremity proximally and 5-/5 distally. Bulk was normal and fasciculations were absent.   Motor Tone - Muscle tone was assessed at the neck and appendages and was normal.  Reflexes - The patient's reflexes were symmetrical in all extremities and he had no pathological reflexes.  Sensory - Light touch, temperature/pinprick were not cooperative on exam.    Coordination - not cooperative on exam.  Tremor was absent.  Gait and Station - not tested due to weakness.   ASSESSMENT/PLAN Mr. QUADE RAMIREZ is a 80 y.o. male with history of hypertension,  hyperlipidemia admitted for aphasia and right-sided weakness. s/p IR with TICI 3 recannulization of left ICA and left MCA, left ICA s/p stenting. Symptoms much improved.    Stroke:  Dominant left MCA territory infarct with ICA occlusion status post mechanical thrombectomy with left ICA stenting,  Embolic secondary to large vessels atherosclerosis  MRI  left MCA territory infarct  MRA  unremarkable after recannulization of left ICA and MCA   Carotid Doppler  Left ICA stent, right ICA 50-60% stenosis   2D Echo  Unremarkable  LE venous Doppler negative for DVT  LDL 59   HbA1c 6.2  Lovenoxor for VTE prophylaxis  Diet NPO time specified   no antithrombotic prior to admission, now on aspirin 325 mg orally every day  Patient counseled to be compliant with his antithrombotic medications  Ongoing aggressive stroke risk factor management  Therapy recommendations:  Pending  Disposition:  Pending   Left ICA occlusion  S/p recannulization with mechanical thrombectomy  S/p ICA stenting  Discussed with Dr. Estanislado Pandy, the left ICA occlusion likely due to extensive atherosclerosis and calcified plaques  Continue aspirin for now  Hypertension  Home meds:   Amlodipine and HCTZ Permissive hypertension (OK if <220/120) for 24-48 hours post stroke and then gradually normalized within 5-7 days. Currently on low BP meds  Stable  Patient counseled to be compliant with his blood pressure medications  Hyperlipidemia  Home meds:  Lipitor 40    Currently on Lipitor 40  LDL 59 , goal < 70  Continue statin at discharge  Other Stroke Risk Factors  Advanced age  Obesity, Body mass index is 32.17 kg/(m^2).   Other Active Problems    Other Pertinent History    Hospital day # 2  This patient is critically ill due to left MCA stroke, left ICA occlusion status post mechanical thrombectomy and at significant risk of neurological worsening, death form recurrent stroke and  in-stent stenosis. This patient's care requires constant monitoring of vital signs, hemodynamics, respiratory and cardiac monitoring, review of multiple databases, neurological assessment, discussion with family, other specialists and medical decision making of high complexity. I spent 35 minutes of neurocritical care time in the care of this patient.  Rosalin Hawking, MD PhD Stroke Neurology 05/25/2014 4:24 PM    To contact Stroke Continuity provider, please refer to [email or web address] After hours, contact General Neurology

## 2014-05-25 NOTE — Evaluation (Signed)
Clinical/Bedside Swallow Evaluation Patient Details  Name: Henry [name] MRN: [id number] Date of Birth: 1935-01-15  Today's Date: 05/25/2014 Time: SLP Start Time (ACUTE ONLY): 6295 SLP Stop Time (ACUTE ONLY): 1412 SLP Time Calculation (min) (ACUTE ONLY): 13 min  Past Medical History:  Past Medical History  Diagnosis Date  . Hypertension   . GERD (gastroesophageal reflux disease)   . Hypercholesteremia   . HOH (hard of hearing)    Past Surgical History:  Past Surgical History  Procedure Laterality Date  . Shoulder surgery Right     torn cartiledge  . Esophagogastroduodenoscopy  10/28/2010    Procedure: ESOPHAGOGASTRODUODENOSCOPY (EGD);  Surgeon: [name], MD;  Location: [facility];  Service: Endoscopy;  Laterality: N/A;  10:30  . Colonoscopy    . Colonoscopy  05/17/2011    Procedure: COLONOSCOPY;  Surgeon: [name], MD;  Location: [facility];  Service: Endoscopy;  Laterality: N/A;  830  . Resection distal clavical Right 12/13/2012    Procedure: RESECTION DISTAL CLAVICAL;  Surgeon: Carole Civil, MD;  Location: [facility];  Service: Orthopedics;  Laterality: Right;  . Shoulder arthroscopy with bicepstenotomy Right 12/13/2012    Procedure: SHOULDER ARTHROSCOPY WITH BICEPS TENOTOMY, limited shoulder debridement;  Surgeon: Carole Civil, MD;  Location: [facility];  Service: Orthopedics;  Laterality: Right;  . Esophagogastroduodenoscopy N/A 04/24/2014    Procedure: ESOPHAGOGASTRODUODENOSCOPY (EGD);  Surgeon: [name], MD;  Location: [facility];  Service: Endoscopy;  Laterality: N/A;  31   HPI:  80 y.o. male hx of HTN, HLD, GERD, HOH admitted with syncopal episode, aphasia and right sided weakness. Per MD report findings pertinent for nonvisualization of the left ICA, occluded proximally within the neck. MRI multi small areas of acute ischemia LEFT middle cerebral artery territory. Small vascular flow void, less likely micro hemorrhage LEFT posterior frontal  lobe without lobar hematoma. Intubated 4/9-4/11 am. CXR low lung volumes, with airspace and interstitial opacities potentially reflecting atelectasis and/ or consolidation.   Assessment / Plan / Recommendation Clinical Impression  Pt with indications of delayed initiation of swallow and palpable decreased larygneal elevation. Additional aspiration risks include inability to produce strong volitional cough, breathy vocal quality and low intensity and receptive and expressive aphasia. Recommend continue NPO, oral care, MBS or FEES next date (history of esophageal dysphagia, however may be beneficial to observe with FEES).    Aspiration Risk   (mod-severe)    Diet Recommendation NPO        Other  Recommendations Recommended Consults:  (MBS versus FEES, will see determine 4/12) Oral Care Recommendations: Oral care BID   Follow Up Recommendations  Inpatient Rehab    Frequency and Duration        Pertinent Vitals/Pain none         Swallow Study Prior Functional Status  Type of Home: House Available Help at Discharge: Family;Available 24 hours/day       Oral/Motor/Sensory Function Overall Oral Motor/Sensory Function: Impaired Labial ROM: Reduced right Labial Symmetry: Abnormal symmetry right Labial Strength: Reduced Lingual ROM: Reduced left Lingual Symmetry: Abnormal symmetry right Lingual Strength: Reduced Facial ROM: Reduced right   Ice Chips Ice chips: Impaired Presentation: Spoon Pharyngeal Phase Impairments: Suspected delayed Swallow;Decreased hyoid-laryngeal movement   Thin Liquid Thin Liquid: Impaired Presentation: Spoon Pharyngeal  Phase Impairments: Suspected delayed Swallow;Decreased hyoid-laryngeal movement    Nectar Thick Nectar Thick Liquid: Not tested   Honey Thick Honey Thick Liquid: Not tested   Puree Puree: Impaired Presentation: Spoon Pharyngeal  Phase Impairments: Suspected delayed Swallow;Decreased hyoid-laryngeal movement   Solid   GO    Solid:  Not tested       [place] Siren 05/25/2014,3:24 PM  Orbie Pyo [place].[employer] [phone]

## 2014-05-25 NOTE — Progress Notes (Signed)
Rehab Admissions Coordinator Note:  Patient was screened by Retta Diones for appropriateness for an Inpatient Acute Rehab Consult.  At this time, we are recommending Inpatient Rehab consult.  Retta Diones 05/25/2014, 2:47 PM  I can be reached at [phone].

## 2014-05-25 NOTE — Consult Note (Signed)
PULMONARY / CRITICAL CARE MEDICINE   Name: Henry [name] MRN: [id number] DOB: 12-30-1934    ADMISSION DATE:  05/23/2014 CONSULTATION DATE:  05/23/14  REFERRING MD : Neuro   CHIEF COMPLAINT:  Vent Management   INITIAL PRESENTATION:  80 yo admitted with syncope/right sided weakness found to have a CVA (out of TPA window) s/p angioplasty/stent  to acutely occluded L ICA  /L MCA   STUDIES:  CT Head 4/9 >No acute intracranial abnormality or significant interval change. 2. Stable atrophy and diffuse white matter disease. This likely reflects the sequela of chronic microvascular ischemia. CTA brain 4/9 >Nonvisualization of the left internal carotid artery, occluded proximally within the neck. There is distal reconstitution at the supraclinoid segment of the left ICA via contralateral flow across the circle of Willis. Flow within the left M1 segment is present but somewhat thready and irregular in appearance, which may in part be related to motion artifact on this exam. There is question of a superimposed focal moderate to severe stenosis and/or partially occlusive thrombus within the distal left M1/ proximal M2 segment near the base of the sylvian fissure. CTA imaging of the neck is suggested to evaluate the extent of the left ICA occlusion. CT cerebral perfusion 4/9 >Area of ischemic core infarct within the left MCA territory, most prevalent within the anterior left frontal lobe MRI 4/10 >>Multi small areas of acute ischemia LEFT middle cerebral artery territory Echo 4/9- wnl  SIGNIFICANT EVENTS: 4/9 IR >S/P lt common carotid arteriogram,followed by complete revascularization of acutely occluded Lt ICA prox with stent assisted angioplasty and stenting,and of Lt MCA with x1 pass    SUBJECTIVE:   sedated  VITAL SIGNS: Temp:  [97.8 F (36.6 C)-100 F (37.8 C)] 100 F (37.8 C) (04/11 0734) Pulse Rate:  [38-57] 46 (04/11 0830) Resp:  [14-21] 18 (04/11 0830) BP: (95-134)/(42-67)  133/54 mmHg (04/11 0830) SpO2:  [97 %-100 %] 99 % (04/11 0830) Arterial Line BP: (104-171)/(50-78) 152/61 mmHg (04/11 0830) FiO2 (%):  [40 %] 40 % (04/11 0830) HEMODYNAMICS:   VENTILATOR SETTINGS: Vent Mode:  [-] PSV;CPAP FiO2 (%):  [40 %] 40 % Set Rate:  [14 bmp] 14 bmp Vt Set:  [600 mL] 600 mL PEEP:  [5 cmH20] 5 cmH20 Pressure Support:  [5 NOM76-72 cmH20] 5 cmH20 Plateau Pressure:  [17 cmH20-22 cmH20] 22 cmH20 INTAKE / OUTPUT:  Intake/Output Summary (Last 24 hours) at 05/25/14 0917 Last data filed at 05/25/14 0800  Gross per 24 hour  Intake 1782.59 ml  Output    610 ml  Net 1172.59 ml    PHYSICAL EXAMINATION: General: sedated on vent  Neuro:  Sedated , RAAS -1, follows commands, rt weaker 2-3/5 upper, rt lower improved HEENT:  ETT , jvd up Cardiovascular:  Reg , no m/r/g Lungs:  Reduced, BS Abdomen:  Soft , BS wnl Musculoskeletal:  Intact  Skin:  Intact   LABS:  CBC  Recent Labs Lab 05/23/14 0158 05/24/14 0354  WBC 8.5 11.0*  HGB 13.9 10.9*  HCT 42.3 34.1*  PLT 226 218   Coag's  Recent Labs Lab 05/23/14 0158  APTT 27  INR 1.06   BMET  Recent Labs Lab 05/23/14 0158 05/24/14 0354 05/25/14 0350  NA 138 137 139  K 4.3 3.6 3.8  CL 103 108 113*  CO2 24 17* 23  BUN 19 15 17   CREATININE 0.91 0.95 0.88  GLUCOSE 137* 142* 105*   Electrolytes  Recent Labs Lab 05/23/14 0158 05/24/14 0354 05/25/14 0350  CALCIUM 9.0 7.9* 8.2*   Sepsis Markers No results for input(s): LATICACIDVEN, PROCALCITON, O2SATVEN in the last 168 hours. ABG  Recent Labs Lab 05/23/14 1243  PHART 7.316*  PCO2ART 38.8  PO2ART 135.0*   Liver Enzymes  Recent Labs Lab 05/23/14 0158  AST 22  ALT 28  ALKPHOS 52  BILITOT 0.6  ALBUMIN 3.7   Cardiac Enzymes  Recent Labs Lab 05/23/14 0158  TROPONINI <0.03   Glucose  Recent Labs Lab 05/23/14 0545  GLUCAP 135*    Imaging Mr Brain Wo Contrast  05/24/2014   CLINICAL DATA:  Syncopal episode, aphasic and  RIGHT-sided weakness. Known acute LEFT middle cerebral artery territory infarct and LEFT ICA occlusion. History of hypertension and hyperlipidemia.  EXAM: MRI HEAD WITHOUT CONTRAST  MRA HEAD WITHOUT CONTRAST  TECHNIQUE: Multiplanar, multiecho pulse sequences of the brain and surrounding structures were obtained without intravenous contrast. Angiographic images of the head were obtained using MRA technique without contrast.  COMPARISON:  CT and CTA of the head May 23, 2014  FINDINGS: MRI HEAD FINDINGS  Motion degraded examination, severely motion degraded coronal T2. Patchy areas of reduced diffusion in LEFT frontal, temporal and parietal lobes, including posterior aspect of the LEFT putaminal and, subcentimeter focus of reduced diffusion LEFT corona radiata. Low ADC values. Largest area proximally 15 mm. No susceptibility artifact to suggest lobar hematoma. Tiny tubular focus of susceptibility artifact in LEFT frontal lobe suggests flow void.  Moderate ventriculomegaly, likely on the basis of global parenchymal brain volume loss, with disproportionate sulcal effacement at the convexities. Confluent supratentorial white matter T2 hyperintensities in addition to T2 hyperintense signal associated with the acute ischemia. No midline shift, mass effect or mass lesions.  No abnormal extra-axial fluid collections. Ocular globes and orbital contents are unremarkable. Mild pan paranasal sinus mucosal thickening without air-fluid levels. Trace mastoid effusions. No abnormal sellar expansion. No cerebellar tonsillar ectopia. No suspicious bone marrow signal.  MRA HEAD FINDINGS  Anterior circulation: Normal flow related enhancement of the included cervical, petrous, cavernous and supra clinoid internal carotid arteries bilaterally. Normal flow related enhancement anterior middle cerebral arteries.  Posterior circulation: RIGHT vertebral artery is dominant. Normal flow early enhancement of the vertebral arteries,  vertebrobasilar junction, basilar artery and posterior cerebral arteries. Robust RIGHT, small LEFT posterior communicating arteries are present. Normal flow early enhancement posterior cerebral arteries.  No large vessel occlusion, high-grade stenosis, aneurysm, suspicious luminal irregularity.  IMPRESSION: MRI HEAD: Multi small areas of acute ischemia LEFT middle cerebral artery territory. Small vascular flow void, less likely micro hemorrhage LEFT posterior frontal lobe without lobar hematoma.  Involutional changes. Moderate to severe white matter changes suggest chronic small vessel ischemic disease.  Findings suggests of normal pressure hydrocephalus.  MRA HEAD: Recannulized LEFT internal carotid artery. Normal MRA of the head, complete circle of Willis.   Electronically Signed   By: Elon Alas   On: 05/24/2014 18:45   Mr Jodene Nam Head/brain Wo Cm  05/24/2014   CLINICAL DATA:  Syncopal episode, aphasic and RIGHT-sided weakness. Known acute LEFT middle cerebral artery territory infarct and LEFT ICA occlusion. History of hypertension and hyperlipidemia.  EXAM: MRI HEAD WITHOUT CONTRAST  MRA HEAD WITHOUT CONTRAST  TECHNIQUE: Multiplanar, multiecho pulse sequences of the brain and surrounding structures were obtained without intravenous contrast. Angiographic images of the head were obtained using MRA technique without contrast.  COMPARISON:  CT and CTA of the head May 23, 2014  FINDINGS: MRI HEAD FINDINGS  Motion degraded examination, severely motion degraded coronal  T2. Patchy areas of reduced diffusion in LEFT frontal, temporal and parietal lobes, including posterior aspect of the LEFT putaminal and, subcentimeter focus of reduced diffusion LEFT corona radiata. Low ADC values. Largest area proximally 15 mm. No susceptibility artifact to suggest lobar hematoma. Tiny tubular focus of susceptibility artifact in LEFT frontal lobe suggests flow void.  Moderate ventriculomegaly, likely on the basis of global  parenchymal brain volume loss, with disproportionate sulcal effacement at the convexities. Confluent supratentorial white matter T2 hyperintensities in addition to T2 hyperintense signal associated with the acute ischemia. No midline shift, mass effect or mass lesions.  No abnormal extra-axial fluid collections. Ocular globes and orbital contents are unremarkable. Mild pan paranasal sinus mucosal thickening without air-fluid levels. Trace mastoid effusions. No abnormal sellar expansion. No cerebellar tonsillar ectopia. No suspicious bone marrow signal.  MRA HEAD FINDINGS  Anterior circulation: Normal flow related enhancement of the included cervical, petrous, cavernous and supra clinoid internal carotid arteries bilaterally. Normal flow related enhancement anterior middle cerebral arteries.  Posterior circulation: RIGHT vertebral artery is dominant. Normal flow early enhancement of the vertebral arteries, vertebrobasilar junction, basilar artery and posterior cerebral arteries. Robust RIGHT, small LEFT posterior communicating arteries are present. Normal flow early enhancement posterior cerebral arteries.  No large vessel occlusion, high-grade stenosis, aneurysm, suspicious luminal irregularity.  IMPRESSION: MRI HEAD: Multi small areas of acute ischemia LEFT middle cerebral artery territory. Small vascular flow void, less likely micro hemorrhage LEFT posterior frontal lobe without lobar hematoma.  Involutional changes. Moderate to severe white matter changes suggest chronic small vessel ischemic disease.  Findings suggests of normal pressure hydrocephalus.  MRA HEAD: Recannulized LEFT internal carotid artery. Normal MRA of the head, complete circle of Willis.   Electronically Signed   By: Elon Alas   On: 05/24/2014 18:45     ASSESSMENT / PLAN:  PULMONARY OETT 4/9 A: Respiratory Failure /Inability to protect airway due to CVA  P:   Weaning cpap 5 ps 5, goal 30 min , assess rsbi, mechanics Neuro  status looks good for extubation pcxr follow up for atx Assess cough, gag  CARDIOVASCULAR CVL none  A: HTN >trop neg  Echo 4/9 >mild LVH, EF 65%, GR1 DD , PAP 35  P:  SB/P goal 120-140 per neurology SCD  Echo WNL   RENAL A:  No acute issues  P:   Avoid free water kvo Chem in am   GASTROINTESTINAL A:  GI prophalaxis  P:   NPO  PPI  Start TF if not extubated  HEMATOLOGIC A:  No acute issues  P :  Monitor cbc  Asa, plavix Consider lovenox  INFECTIOUS A:  None apparent  P:   Follow temp/wbc curve   ENDOCRINE A:  Hyperglycemia  P:   cbg with bmet  NEUROLOGIC A:  stroke P:   RASS goal: 0 Dc sedation  slp will be needed PT  FAMILY  - Updates: none present 4/9, 4/11   - Inter-disciplinary family meet or Palliative Care meeting due by:  day 7  /4/16   Ccm time 30 min   Lavon Paganini. Titus Mould, MD, [place] Pgr: [facility]

## 2014-05-25 NOTE — Evaluation (Signed)
Physical Therapy Evaluation Patient Details Name: Henry [name] MRN: [id number] DOB: 05/11/1934 Today's Date: 05/25/2014   History of Present Illness  Patient is a 80 y/o male admitted with syncope and right sided weakness. CT: Left MCA infarct and s/p complete revascularization of acutely occluded Lt ICA . PMHx:HTN  Clinical Impression  Patient presents with decreased independence with mobility due to deficits listed in PT problem list.  He will benefit from skilled PT in the acute setting to maximize independence and safety to allow return home with family assist following CIR level rehab stay.    Follow Up Recommendations CIR    Equipment Recommendations  Other (comment) (TBA)    Recommendations for Other Services       Precautions / Restrictions Precautions Precautions: Fall Precaution Comments: h/o dizziness in standing per family (?vestibular versus orthostatic issues) Restrictions Weight Bearing Restrictions: No      Mobility  Bed Mobility Overal bed mobility: Needs Assistance Bed Mobility: Supine to Sit     Supine to sit: Mod assist;+2 for safety/equipment     General bed mobility comments: +another to assist with lines  Transfers Overall transfer level: Needs assistance   Transfers: Sit to/from Stand Sit to Stand: Min assist;+2 safety/equipment         General transfer comment: assist for safety/balance; pt initially impulsive requiring verbal, tactile, gesturing cues for safety  Ambulation/Gait Ambulation/Gait assistance: Min assist;+2 physical assistance Ambulation Distance (Feet): 35 Feet Assistive device: 2 person hand held assist Gait Pattern/deviations: Step-through pattern;Shuffle;Decreased stride length;Wide base of support     General Gait Details: self limited, but unsure if pt dizzy/lightheaded or just confused due to communication deficits  Stairs            Wheelchair Mobility    Modified Rankin (Stroke Patients  Only) Modified Rankin (Stroke Patients Only) Pre-Morbid Rankin Score: No symptoms Modified Rankin: Moderately severe disability     Balance Overall balance assessment: Needs assistance   Sitting balance-Leahy Scale: Good Sitting balance - Comments: reaches to feet and anteriorly about 8"   Standing balance support: Bilateral upper extremity supported Standing balance-Leahy Scale: Poor Standing balance comment: grossly unstable, provided UE support for standing activities                             Pertinent Vitals/Pain Pain Assessment: No/denies pain    Home Living Family/patient expects to be discharged to:: Private residence Living Arrangements: Spouse/significant other Available Help at Discharge: Family;Available 24 hours/day Type of Home: House Home Access: Stairs to enter Entrance Stairs-Rails: None Entrance Stairs-Number of Steps: 1 Home Layout: One level Home Equipment: None      Prior Function Level of Independence: Independent         Comments: loves to golf. Retired Media planner   Dominant Hand: Right    Extremity/Trunk Assessment   Upper Extremity Assessment: RUE deficits/detail RUE Deficits / Details: moves slower than the left         Lower Extremity Assessment: RLE deficits/detail RLE Deficits / Details: moves antigravity; slower to respond in testing ankle DF       Communication   Communication: Receptive difficulties;Expressive difficulties  Cognition Arousal/Alertness: Awake/alert Behavior During Therapy: Impulsive Overall Cognitive Status: Impaired/Different from baseline Area of Impairment: Orientation;Attention;Following commands;Safety/judgement;Problem solving Orientation Level:  (initially nodded yes to born in Jan, took time to change to Sept;  nodded yes year being 2000) Current Attention Level: Sustained  Following Commands: Follows one step commands inconsistently;Follows one step commands  with increased time Safety/Judgement: Decreased awareness of safety;Decreased awareness of deficits   Problem Solving: Requires verbal cues;Requires tactile cues      General Comments General comments (skin integrity, edema, etc.): family supportive and in room throughout session.  Providing input on history and assisting with equipment as directed    Exercises        Assessment/Plan    PT Assessment Patient needs continued PT services  PT Diagnosis Abnormality of gait;Generalized weakness;Altered mental status   PT Problem List Decreased strength;Decreased mobility;Decreased safety awareness;Decreased activity tolerance;Decreased cognition;Decreased balance;Decreased knowledge of use of DME  PT Treatment Interventions DME instruction;Therapeutic exercise;Gait training;Balance training;Functional mobility training;Therapeutic activities;Patient/family education   PT Goals (Current goals can be found in the Care Plan section) Acute Rehab PT Goals Patient Stated Goal: To return to independent PT Goal Formulation: With patient/family Time For Goal Achievement: 06/08/14 Potential to Achieve Goals: Good    Frequency Min 4X/week   Barriers to discharge        Co-evaluation PT/OT/SLP Co-Evaluation/Treatment: Yes Reason for Co-Treatment: For patient/therapist safety PT goals addressed during session: Balance;Mobility/safety with mobility OT goals addressed during session: Strengthening/ROM       End of Session Equipment Utilized During Treatment: Gait belt;Cervical collar Activity Tolerance: Patient tolerated treatment well Patient left: in chair;with call bell/phone within reach;with family/visitor present Nurse Communication: Mobility status;Precautions;Other (comment) (family in room so did not place chair alarm)         Time: 5027-7412 PT Time Calculation (min) (ACUTE ONLY): 33 min   Charges:   PT Evaluation $Initial PT Evaluation Tier I: 1 Procedure     PT G  Codes:        Henry [name],Henry [name] 06/14/2014, 1:47 PM  Henry [name], [place] 06-14-14

## 2014-05-26 ENCOUNTER — Inpatient Hospital Stay (HOSPITAL_COMMUNITY): Payer: Medicare Other

## 2014-05-26 ENCOUNTER — Encounter (HOSPITAL_COMMUNITY): Payer: Self-pay | Admitting: Radiology

## 2014-05-26 ENCOUNTER — Inpatient Hospital Stay (HOSPITAL_COMMUNITY)
Admission: RE | Admit: 2014-05-26 | Discharge: 2014-06-05 | DRG: 057 | Disposition: A | Discharge status: Home Health | Payer: Medicare Other | Source: Intra-hospital | Attending: Physical Medicine & Rehabilitation | Admitting: Physical Medicine & Rehabilitation

## 2014-05-26 DIAGNOSIS — K869 Disease of pancreas, unspecified: Secondary | ICD-10-CM | POA: Diagnosis not present

## 2014-05-26 DIAGNOSIS — R062 Wheezing: Secondary | ICD-10-CM | POA: Diagnosis not present

## 2014-05-26 DIAGNOSIS — K219 Gastro-esophageal reflux disease without esophagitis: Secondary | ICD-10-CM | POA: Diagnosis not present

## 2014-05-26 DIAGNOSIS — K8020 Calculus of gallbladder without cholecystitis without obstruction: Secondary | ICD-10-CM | POA: Diagnosis not present

## 2014-05-26 DIAGNOSIS — E785 Hyperlipidemia, unspecified: Secondary | ICD-10-CM | POA: Diagnosis not present

## 2014-05-26 DIAGNOSIS — T45515A Adverse effect of anticoagulants, initial encounter: Secondary | ICD-10-CM

## 2014-05-26 DIAGNOSIS — R310 Gross hematuria: Secondary | ICD-10-CM

## 2014-05-26 DIAGNOSIS — I69320 Aphasia following cerebral infarction: Secondary | ICD-10-CM | POA: Diagnosis not present

## 2014-05-26 DIAGNOSIS — C252 Malignant neoplasm of tail of pancreas: Secondary | ICD-10-CM | POA: Diagnosis not present

## 2014-05-26 DIAGNOSIS — R339 Retention of urine, unspecified: Secondary | ICD-10-CM | POA: Diagnosis not present

## 2014-05-26 DIAGNOSIS — N138 Other obstructive and reflux uropathy: Secondary | ICD-10-CM | POA: Diagnosis not present

## 2014-05-26 DIAGNOSIS — Z79899 Other long term (current) drug therapy: Secondary | ICD-10-CM

## 2014-05-26 DIAGNOSIS — R1314 Dysphagia, pharyngoesophageal phase: Secondary | ICD-10-CM | POA: Diagnosis present

## 2014-05-26 DIAGNOSIS — I69321 Dysphasia following cerebral infarction: Secondary | ICD-10-CM | POA: Diagnosis not present

## 2014-05-26 DIAGNOSIS — N401 Enlarged prostate with lower urinary tract symptoms: Secondary | ICD-10-CM

## 2014-05-26 DIAGNOSIS — N400 Enlarged prostate without lower urinary tract symptoms: Secondary | ICD-10-CM | POA: Diagnosis not present

## 2014-05-26 DIAGNOSIS — I69354 Hemiplegia and hemiparesis following cerebral infarction affecting left non-dominant side: Principal | ICD-10-CM

## 2014-05-26 DIAGNOSIS — R933 Abnormal findings on diagnostic imaging of other parts of digestive tract: Secondary | ICD-10-CM | POA: Diagnosis not present

## 2014-05-26 DIAGNOSIS — D62 Acute posthemorrhagic anemia: Secondary | ICD-10-CM

## 2014-05-26 DIAGNOSIS — R482 Apraxia: Secondary | ICD-10-CM | POA: Diagnosis not present

## 2014-05-26 DIAGNOSIS — I63512 Cerebral infarction due to unspecified occlusion or stenosis of left middle cerebral artery: Secondary | ICD-10-CM | POA: Diagnosis present

## 2014-05-26 DIAGNOSIS — R1310 Dysphagia, unspecified: Secondary | ICD-10-CM | POA: Diagnosis not present

## 2014-05-26 DIAGNOSIS — Z87891 Personal history of nicotine dependence: Secondary | ICD-10-CM | POA: Diagnosis not present

## 2014-05-26 DIAGNOSIS — T43215A Adverse effect of selective serotonin and norepinephrine reuptake inhibitors, initial encounter: Secondary | ICD-10-CM | POA: Diagnosis not present

## 2014-05-26 DIAGNOSIS — H9190 Unspecified hearing loss, unspecified ear: Secondary | ICD-10-CM | POA: Diagnosis not present

## 2014-05-26 DIAGNOSIS — I10 Essential (primary) hypertension: Secondary | ICD-10-CM | POA: Diagnosis not present

## 2014-05-26 DIAGNOSIS — I6789 Other cerebrovascular disease: Secondary | ICD-10-CM | POA: Diagnosis not present

## 2014-05-26 DIAGNOSIS — I69920 Aphasia following unspecified cerebrovascular disease: Secondary | ICD-10-CM | POA: Diagnosis not present

## 2014-05-26 DIAGNOSIS — R4701 Aphasia: Secondary | ICD-10-CM | POA: Diagnosis not present

## 2014-05-26 DIAGNOSIS — R319 Hematuria, unspecified: Secondary | ICD-10-CM | POA: Diagnosis not present

## 2014-05-26 DIAGNOSIS — G8190 Hemiplegia, unspecified affecting unspecified side: Secondary | ICD-10-CM | POA: Diagnosis not present

## 2014-05-26 DIAGNOSIS — N3289 Other specified disorders of bladder: Secondary | ICD-10-CM | POA: Diagnosis not present

## 2014-05-26 LAB — CBC WITH DIFFERENTIAL/PLATELET
BASOS ABS: 0 10*3/uL (ref 0.0–0.1)
BASOS PCT: 0 % (ref 0–1)
Eosinophils Absolute: 0 10*3/uL (ref 0.0–0.7)
Eosinophils Relative: 0 % (ref 0–5)
HCT: 35.2 % — ABNORMAL LOW (ref 39.0–52.0)
Hemoglobin: 11.2 g/dL — ABNORMAL LOW (ref 13.0–17.0)
Lymphocytes Relative: 19 % (ref 12–46)
Lymphs Abs: 1.8 10*3/uL (ref 0.7–4.0)
MCH: 27.4 pg (ref 26.0–34.0)
MCHC: 31.8 g/dL (ref 30.0–36.0)
MCV: 86.1 fL (ref 78.0–100.0)
MONOS PCT: 11 % (ref 3–12)
Monocytes Absolute: 1.1 10*3/uL — ABNORMAL HIGH (ref 0.1–1.0)
NEUTROS PCT: 70 % (ref 43–77)
Neutro Abs: 6.6 10*3/uL (ref 1.7–7.7)
PLATELETS: 187 10*3/uL (ref 150–400)
RBC: 4.09 MIL/uL — AB (ref 4.22–5.81)
RDW: 13.8 % (ref 11.5–15.5)
WBC: 9.5 10*3/uL (ref 4.0–10.5)

## 2014-05-26 LAB — BASIC METABOLIC PANEL
Anion gap: 6 (ref 5–15)
BUN: 20 mg/dL (ref 6–23)
CHLORIDE: 114 mmol/L — AB (ref 96–112)
CO2: 23 mmol/L (ref 19–32)
Calcium: 8.4 mg/dL (ref 8.4–10.5)
Creatinine, Ser: 0.93 mg/dL (ref 0.50–1.35)
GFR calc non Af Amer: 79 mL/min — ABNORMAL LOW (ref >90)
Glucose, Bld: 105 mg/dL — ABNORMAL HIGH (ref 70–99)
POTASSIUM: 3.8 mmol/L (ref 3.5–5.1)
Sodium: 143 mmol/L (ref 135–145)

## 2014-05-26 LAB — PLATELET INHIBITION P2Y12: Platelet Function  P2Y12: 113 [PRU] — ABNORMAL LOW (ref 194–418)

## 2014-05-26 MED ORDER — CLOPIDOGREL BISULFATE 75 MG PO TABS
75.0000 mg | ORAL_TABLET | Freq: Every day | ORAL | Status: DC
  Administered 2014-05-27 – 2014-06-01 (×6): 75 mg via ORAL
  Filled 2014-05-26 (×7): qty 1

## 2014-05-26 MED ORDER — ASPIRIN EC 81 MG PO TBEC
81.0000 mg | DELAYED_RELEASE_TABLET | Freq: Every day | ORAL | Status: DC
  Administered 2014-05-26: 81 mg via ORAL
  Filled 2014-05-26: qty 1

## 2014-05-26 MED ORDER — ONDANSETRON HCL 4 MG/2ML IJ SOLN: 4.0000 mg | Freq: Four times a day (QID) | INTRAMUSCULAR | Status: DC | PRN

## 2014-05-26 MED ORDER — ATORVASTATIN CALCIUM 40 MG PO TABS
40.0000 mg | ORAL_TABLET | Freq: Every evening | ORAL | Status: DC
  Administered 2014-05-27 – 2014-06-04 (×9): 40 mg via ORAL
  Filled 2014-05-26 (×10): qty 1

## 2014-05-26 MED ORDER — ENOXAPARIN SODIUM 40 MG/0.4ML ~~LOC~~ SOLN
40.0000 mg | SUBCUTANEOUS | Status: DC
  Administered 2014-05-27 – 2014-05-31 (×5): 40 mg via SUBCUTANEOUS
  Filled 2014-05-26 (×6): qty 0.4

## 2014-05-26 MED ORDER — ONDANSETRON HCL 4 MG PO TABS: 4.0000 mg | ORAL_TABLET | Freq: Four times a day (QID) | ORAL | Status: DC | PRN

## 2014-05-26 MED ORDER — PANTOPRAZOLE SODIUM 40 MG PO PACK: 40.0000 mg | PACK | Freq: Every day | ORAL | Status: DC

## 2014-05-26 MED ORDER — ACETAMINOPHEN 650 MG RE SUPP: 650.0000 mg | Freq: Four times a day (QID) | RECTAL | Status: DC | PRN

## 2014-05-26 MED ORDER — ACETAMINOPHEN 500 MG PO TABS
1000.0000 mg | ORAL_TABLET | Freq: Four times a day (QID) | ORAL | Status: DC | PRN
  Administered 2014-06-01: 1000 mg via ORAL
  Filled 2014-05-26: qty 2

## 2014-05-26 MED ORDER — ENOXAPARIN SODIUM 40 MG/0.4ML ~~LOC~~ SOLN: 40.0000 mg | SUBCUTANEOUS | Status: DC

## 2014-05-26 MED ORDER — CETYLPYRIDINIUM CHLORIDE 0.05 % MT LIQD: 7.0000 mL | Freq: Two times a day (BID) | OROMUCOSAL | Status: DC

## 2014-05-26 MED ORDER — SORBITOL 70 % SOLN: 30.0000 mL | Freq: Every day | Status: DC | PRN

## 2014-05-26 MED ORDER — ALBUTEROL SULFATE (2.5 MG/3ML) 0.083% IN NEBU
2.5000 mg | INHALATION_SOLUTION | RESPIRATORY_TRACT | Status: DC | PRN
  Administered 2014-05-27 – 2014-05-28 (×2): 2.5 mg via RESPIRATORY_TRACT
  Filled 2014-05-26 (×3): qty 3

## 2014-05-26 MED ORDER — ASPIRIN EC 81 MG PO TBEC
81.0000 mg | DELAYED_RELEASE_TABLET | Freq: Every day | ORAL | Status: DC
  Administered 2014-05-27 – 2014-06-05 (×10): 81 mg via ORAL
  Filled 2014-05-26 (×11): qty 1

## 2014-05-26 MED ORDER — ENOXAPARIN SODIUM 40 MG/0.4ML ~~LOC~~ SOLN
40.0000 mg | SUBCUTANEOUS | Status: DC
  Filled 2014-05-26: qty 0.4

## 2014-05-26 NOTE — Progress Notes (Signed)
Physical Therapy Treatment Patient Details Name: Henry [name] MRN: [id number] DOB: 1934/12/09 Today's Date: 05/26/2014    History of Present Illness Patient is a 80 y/o male admitted with syncope and right sided weakness. CT: Left MCA infarct and s/p complete revascularization of acutely occluded Lt ICA . PMHx:HTN    PT Comments    Pt impulsive and eager for OOB.  Pt needs max gestural cueing for safety with mobility.  Pt has difficulty attending to tasks with outside distractions.  Continue to feel pt would benefit from CIR at D/C.  Will continue to follow.    Follow Up Recommendations  CIR     Equipment Recommendations  None recommended by PT    Recommendations for Other Services       Precautions / Restrictions Precautions Precautions: Fall Precaution Comments: h/o dizziness in standing per family (?vestibular versus orthostatic issues) Restrictions Weight Bearing Restrictions: No    Mobility  Bed Mobility Overal bed mobility: Needs Assistance Bed Mobility: Supine to Sit     Supine to sit: Mod assist;HOB elevated     General bed mobility comments: A with bringing trunk up to sitting and scooting hips to EOB.  pt has difficulty following verbal directions, but does well with gestural cueing.    Transfers Overall transfer level: Needs assistance Equipment used: Rolling walker (2 wheeled) Transfers: Sit to/from [facility] Sit to Stand: Min assist;+2 physical assistance;+2 safety/equipment Stand pivot transfers: Min assist;+2 physical assistance       General transfer comment: pt impulsive and upon sight of RW kept reaching out for it and trying to standing despite multiple verbal and gestural cues.  2nd person present for safety and to A with management of lines.  pt indicating needing to have BM, so pivoted to 3-in-1 without success.  RN aware.    Ambulation/Gait Ambulation/Gait assistance: Min assist;+2 physical assistance;+2  safety/equipment Ambulation Distance (Feet): 40 Feet (and 30) Assistive device: Rolling walker (2 wheeled) Gait Pattern/deviations: Step-through pattern;Decreased stride length;Decreased weight shift to left;Drifts right/left     General Gait Details: pt needs A for management of RW, positioning within RW.  pt easily distracted and begins to drift to R side with outside distractions.  pt with audible wheezing during ambulation and worse as he fatigues, but O2 sats remained in 90's on RA.     Stairs            Wheelchair Mobility    Modified Rankin (Stroke Patients Only) Modified Rankin (Stroke Patients Only) Pre-Morbid Rankin Score: No symptoms Modified Rankin: Moderately severe disability     Balance Overall balance assessment: Needs assistance Sitting-balance support: No upper extremity supported;Feet supported Sitting balance-Leahy Scale: Good     Standing balance support: Bilateral upper extremity supported;During functional activity Standing balance-Leahy Scale: Poor                      Cognition Arousal/Alertness: Awake/alert Behavior During Therapy: Impulsive Overall Cognitive Status: Impaired/Different from baseline Area of Impairment: Orientation;Attention;Following commands;Safety/judgement;Awareness;Problem solving Orientation Level: Disoriented to;Time;Situation (pt having trouble picking correct answer out of list of 3.) Current Attention Level: Focused   Following Commands: Follows one step commands inconsistently Safety/Judgement: Decreased awareness of safety;Decreased awareness of deficits Awareness: Intellectual Problem Solving: Slow processing;Difficulty sequencing;Decreased initiation;Requires verbal cues;Requires tactile cues      Exercises      General Comments        Pertinent Vitals/Pain Pain Assessment: No/denies pain    Home Living  Prior Function            PT Goals (current goals can  now be found in the care plan section) Acute Rehab PT Goals Patient Stated Goal: family: to get him to rehab PT Goal Formulation: With patient/family Time For Goal Achievement: 06/08/14 Potential to Achieve Goals: Good Progress towards PT goals: Progressing toward goals    Frequency  Min 4X/week    PT Plan Current plan remains appropriate    Co-evaluation             End of Session Equipment Utilized During Treatment: Gait belt Activity Tolerance: Patient tolerated treatment well Patient left: in chair;with call bell/phone within reach;with chair alarm set;with family/visitor present     Time: 5631-4970 PT Time Calculation (min) (ACUTE ONLY): 31 min  Charges:  $Gait Training: 8-22 mins $Therapeutic Activity: 8-22 mins                    G CodesCatarina Hartshorn, [place] 05/26/2014, 9:41 AM

## 2014-05-26 NOTE — Discharge Summary (Signed)
Stroke Discharge Summary  Patient ID: Henry [name]   MRN: [id number]      DOB: [birth year]  Date of Admission: 05/23/2014 Date of Discharge: 05/26/2014  Attending Physician:  Rosalin Hawking, MD, Stroke MD  Consulting Physician(s):    pulmonary/intensive care and rehabilitation medicine  Patient's PCP:  Lanette Hampshire, MD  Discharge Diagnoses:    Stroke, left MCA territory infarct   Left ICA and MCA tandem occlusion s/p mechanical thrombectomy and carotid artery stenting   Acute right-sided weakness   Expressive aphasia   Respiratory failure s/p intubation and extubation  BMI  Body mass index is 32.17 kg/(m^2).  Past Medical History  Diagnosis Date  . Hypertension   . GERD (gastroesophageal reflux disease)   . Hypercholesteremia   . HOH (hard of hearing)    Past Surgical History  Procedure Laterality Date  . Shoulder surgery Right     torn cartiledge  . Esophagogastroduodenoscopy  10/28/2010    Procedure: ESOPHAGOGASTRODUODENOSCOPY (EGD);  Surgeon: [name], MD;  Location: [facility];  Service: Endoscopy;  Laterality: N/A;  10:30  . Colonoscopy    . Colonoscopy  05/17/2011    Procedure: COLONOSCOPY;  Surgeon: [name], MD;  Location: [facility];  Service: Endoscopy;  Laterality: N/A;  830  . Resection distal clavical Right 12/13/2012    Procedure: RESECTION DISTAL CLAVICAL;  Surgeon: Carole Civil, MD;  Location: [facility];  Service: Orthopedics;  Laterality: Right;  . Shoulder arthroscopy with bicepstenotomy Right 12/13/2012    Procedure: SHOULDER ARTHROSCOPY WITH BICEPS TENOTOMY, limited shoulder debridement;  Surgeon: Carole Civil, MD;  Location: [facility];  Service: Orthopedics;  Laterality: Right;  . Esophagogastroduodenoscopy N/A 04/24/2014    Procedure: ESOPHAGOGASTRODUODENOSCOPY (EGD);  Surgeon: [name], MD;  Location: [facility];  Service: Endoscopy;  Laterality: N/A;  815    Medications to be continued on Rehab .  stroke: mapping  our early stages of recovery book   Does not apply Once  . antiseptic oral rinse  7 mL Mouth Rinse QID  . aspirin EC  81 mg Oral Daily  . atorvastatin  40 mg Oral QPM  . chlorhexidine  15 mL Mouth Rinse BID  . clopidogrel  75 mg Oral Q breakfast  . enoxaparin (LOVENOX) injection  40 mg Subcutaneous Q24H  . pantoprazole (PROTONIX) IV  40 mg Intravenous Daily    LABORATORY STUDIES CBC    Component Value Date/Time   WBC 9.5 05/26/2014 0240   WBC 5.7 10/11/2010   RBC 4.09* 05/26/2014 0240   HGB 11.2* 05/26/2014 0240   HCT 35.2* 05/26/2014 0240   PLT 187 05/26/2014 0240   MCV 86.1 05/26/2014 0240   MCH 27.4 05/26/2014 0240   MCHC 31.8 05/26/2014 0240   RDW 13.8 05/26/2014 0240   LYMPHSABS 1.8 05/26/2014 0240   MONOABS 1.1* 05/26/2014 0240   EOSABS 0.0 05/26/2014 0240   BASOSABS 0.0 05/26/2014 0240   CMP    Component Value Date/Time   NA 143 05/26/2014 0240   NA 141 10/11/2010   K 3.8 05/26/2014 0240   CL 114* 05/26/2014 0240   CO2 23 05/26/2014 0240   GLUCOSE 105* 05/26/2014 0240   BUN 20 05/26/2014 0240   BUN 21 10/11/2010   CREATININE 0.93 05/26/2014 0240   CREATININE 1.0 10/11/2010   CALCIUM 8.4 05/26/2014 0240   PROT 7.1 05/23/2014 0158   ALBUMIN 3.7 05/23/2014 0158   AST 22 05/23/2014 0158  ALT 28 05/23/2014 0158   ALKPHOS 52 05/23/2014 0158   BILITOT 0.6 05/23/2014 0158   GFRNONAA 79* 05/26/2014 0240   GFRAA >90 05/26/2014 0240   COAGS Lab Results  Component Value Date   INR 1.06 05/23/2014   Lipid Panel    Component Value Date/Time   CHOL 110 05/24/2014 0354   TRIG 79 05/24/2014 0354   HDL 35* 05/24/2014 0354   CHOLHDL 3.1 05/24/2014 0354   VLDL 16 05/24/2014 0354   LDLCALC 59 05/24/2014 0354   HgbA1C  Lab Results  Component Value Date   HGBA1C 6.2* 05/24/2014   Cardiac Panel (last 3 results) No results for input(s): CKTOTAL, CKMB, TROPONINI, RELINDX in the last 72 hours. Urinalysis    Component Value Date/Time   COLORURINE YELLOW  05/23/2014 0428   APPEARANCEUR CLEAR 05/23/2014 0428   LABSPEC 1.029 05/23/2014 0428   PHURINE 5.5 05/23/2014 0428   GLUCOSEU NEGATIVE 05/23/2014 0428   HGBUR NEGATIVE 05/23/2014 0428   BILIRUBINUR NEGATIVE 05/23/2014 0428   KETONESUR NEGATIVE 05/23/2014 0428   PROTEINUR NEGATIVE 05/23/2014 0428   UROBILINOGEN 0.2 05/23/2014 0428   NITRITE NEGATIVE 05/23/2014 0428   LEUKOCYTESUR NEGATIVE 05/23/2014 0428   Urine Drug Screen     Component Value Date/Time   LABOPIA NONE DETECTED 05/23/2014 0428   COCAINSCRNUR NONE DETECTED 05/23/2014 0428   LABBENZ NONE DETECTED 05/23/2014 0428   AMPHETMU NONE DETECTED 05/23/2014 0428   THCU NONE DETECTED 05/23/2014 0428   LABBARB NONE DETECTED 05/23/2014 0428    Alcohol Level    Component Value Date/Time   ETH <5 05/23/2014 0158     SIGNIFICANT DIAGNOSTIC STUDIES Ct Angio Head W/cm &/or Wo Cm  05/23/2014 IMPRESSION: 1. Nonvisualization of the left internal carotid artery, occluded proximally within the neck. There is distal reconstitution at the supraclinoid segment of the left ICA via contralateral flow across the circle of Willis. Flow within the left M1 segment is present but somewhat thready and irregular in appearance, which may in part be related to motion artifact on this exam. There is question of a superimposed focal moderate to severe stenosis and/or partially occlusive thrombus within the distal left M1/ proximal M2 segment near the base of the sylvian fissure. CTA imaging of the neck is suggested to evaluate the extent of the left ICA occlusion. 2. Widely patent right internal carotid artery system. 3. Widely patent vertebrobasilar system. A small left posterior communicating artery is present.   Ct Head Wo Contrast  05/23/2014 IMPRESSION: Post angiography without evidence of intracranial hemorrhage or intracranial enhancing lesion. Indistinctness left subinsular region, left basal ganglia and portions of the left corona radiata/left  frontal lobe may represent result of acute infarct. Prominent small vessel disease type changes.   05/23/2014 IMPRESSION: 1. No acute intracranial abnormality or significant interval change. 2. Stable atrophy and diffuse white matter disease. This likely reflects the sequela of chronic microvascular ischemia.   Ct Cerebral Perfusion W/cm  05/23/2014 IMPRESSION: 1. Area of ischemic core infarct within the left MCA territory, most prevalent within the anterior left frontal lobe. 2. Adjacent areas of elevated time to peak with relatively preserved CBV in the left MCA territory. Finding suspicious for ischemic penumbra. Size of ischemic penumbra overall approximately 1/3 the size of affected area.   Dg Chest Port 1 View  05/25/2014 IMPRESSION: Endotracheal tube similar to comparison, terminating approximately 2.9 cm above the carina. Unchanged gastric tube. Low lung volumes, with airspace and interstitial opacities potentially reflecting atelectasis and/ or consolidation. Widened mediastinum,  accentuated by the rotation of the patient. If there were concern for acute mediastinal or vascular abnormality, CT would be advised.   Dg Chest Port 1 View  05/23/2014 IMPRESSION: Central mild vascular congestion and mild perihilar increased bronchial markings. No convincing pulmonary edema. Mild basilar atelectasis without segmental infiltrate. Endotracheal tube in place with tip 3.3 cm above the carina. NG tube in place.   Mri and Mra Head/brain Wo Cm  05/24/2014 IMPRESSION: MRI HEAD: Multi small areas of acute ischemia LEFT middle cerebral artery territory. Small vascular flow void, less likely micro hemorrhage LEFT posterior frontal lobe without lobar hematoma. Involutional changes. Moderate to severe white matter changes suggest chronic small vessel ischemic disease. Findings suggests of normal pressure hydrocephalus. MRA HEAD: Recannulized LEFT internal carotid artery. Normal MRA of the head,  complete circle of Willis.   Carotid Doppler Right: ECA stenosis. 40-59% internal carotid artery stenosis by velocities. But ICA/CCA ratio of 3.07 suggests stenosis in the 60-79% range. Left: <50% stenosis in the stent. Bilateral: Vertebral artery flow is antegrade.  2D Echocardiogram - Normal LV size with mild LV hypertrophy. EF 65-70%. Normal RV size and systolic function. No significant valvular abnormalities.  LE venous doppler - negative for DVT    HISTORY OF PRESENT ILLNES Henry [name] is an 80 y.o. male hx of HTN, HLD presenting to [facility] with syncopal episode. Upon arrival found to be aphasic with right sided weakness. LSW 2100. Outside IV tPA window by time of arrival. A CT angiogram was ordered and the patient was transferred to [facility] for possible IR intervention. Upon arrival patient unable to provide further history and no family present. CT head and CT angiogram imaging reviewed with neuro-radiology. Findings pertinent for nonvisualization of the left ICA, occluded proximally within the neck. Case discussed with neuro-IR, Dr Estanislado Pandy, will proceed with perfusion study.   Initial NIHSS of 15 (though likely would be higher as unable to fully participate in testing).   Date last known well: 05/22/2014 Time last known well: 2100 tPA Given: no, outside IV tPA window.   HOSPITAL COURSE Perfusion study showed large penumbra, he was taking to IR, and mechanical thrombectomy was performed. Left ICA and MCA recannulized, and left ICA stenting was performed. He was remained intubated and admitted to neuro ICU. MRI showed left MCA territory infarct, and MRI showed white patent left ICA and MCA. Carotid Doppler showed left ICA stenting, right ICA 50-60% stenosis. 2-D echo unremarkable. LDL 59 and A1c 6.2. Lower stream the DVT negative. He was extubated on 05/25/2014 and his right-sided upper and lower extremity muscle strength improved nicely, however he still has severe  dysarthria and aphasia. Rehabilitation consult placed and he was scheduled for CIR. He passed swallow on discharge, was put on pured diet. He will need continued aspirin and Plavix for 3 months, and then Plavix monotherapy lifetime, and the follow-up in clinic. He was discharged to CIR in good condition.  DISCHARGE EXAM Blood pressure 135/65, pulse 43, temperature 98.6 F (37 C), temperature source Oral, resp. rate 27, height 5\' 10"  (1.778 m), weight 224 lb 3.3 oz (101.7 kg), SpO2 98 %.   General - Well nourished, well developed, in no apparent distress, sitting in chair.  Ophthalmologic - Sharp disc margins OU.  Cardiovascular - Regular rate and rhythm with no murmur.  Neck - supple, no carotid bruits  Mental Status -  Awake, alert, lying in bed, severe dysarthria with intelligible words, expressive aphasia but able to articulate single simple word,  able to follow some simple commands, not complex commands.  Cranial Nerves II - XII - II - blinking to visual threat bilaterally. III, IV, VI - Extraocular movements intact. V - Facial sensation not cooperative on exam. VII - right facial droop. VIII - Hearing & vestibular intact bilaterally. X - not cooperative on exam. XI - not cooperative on exam. XII - Tongue protrusion intact.  Motor Strength - The patient's strength was 4/5 right upper extremity and pronator drift was present on the right. 4/5 right lower extremity proximally and 5-/5 distally. Bulk was normal and fasciculations were absent.  Motor Tone - Muscle tone was assessed at the neck and appendages and was normal.  Reflexes - The patient's reflexes were symmetrical in all extremities and he had no pathological reflexes.  Sensory - Light touch, temperature/pinprick were not cooperative on exam.   Coordination - not cooperative on exam. Tremor was absent.  Gait and Station - not tested due to weakness.   Discharge Diet  DIET - DYS 1 Room service appropriate?: No;  Fluid consistency:: Thin liquids  DISCHARGE PLAN  Disposition:  Transfer to [facility] for ongoing PT, OT and ST  aspirin 81 mg orally every day and clopidogrel 75 mg orally every day for secondary stroke prevention.  Recommend ongoing risk factor control by Primary Care Physician at time of discharge from inpatient rehabilitation.  Follow-up MCINNIS,ANGUS G, MD in 2 weeks following discharge from rehab.  Follow-up with Dr. Rosalin Hawking, Stroke Clinic in 2 months.   35 minutes were spent preparing discharge.  Rosalin Hawking, MD PhD Stroke Neurology 05/26/2014 2:14 PM

## 2014-05-26 NOTE — H&P (Signed)
Physical Medicine and Rehabilitation Admission H&P    Henry Complaint  Patient presents with  . Loss of Consciousness  : HPI: Henry [name] is a 80 y.o.right handed male with history of hypertension, hyperlipidemia. Independent prior to admission living with his wife. Presented 05/23/2014 to [facility] with syncopal episode with findings of aphasia as well as right-sided weakness. Patient did not receive TPA. Initial cranial CT scan showed no acute intracranial abnormality. CT angio of the head showed nonvisualization of the left trocar artery, occluded proximally within the neck. Patient was transferred to [facility] for ongoing care. CT cerebral perfusion scan showed areas of ischemic core infarct within the left MCA territory. Echocardiogram with ejection fraction of 65% grade 1 diastolic dysfunction. Venous Doppler study with no signs of DVT. Carotid arteriogram completed followed by revascularization of acutely occluded left ICA proximal with stent assisted angioplasty per interventional radiology 05/23/2014. Patient initially maintained on ventilator followed by critical care medicine for a short time. Follow-up MRI of the brain 05/24/2014 showing multiple small areas of acute ischemic left middle cerebral artery territory. Neurology follow-up currently maintained on aspirin and Plavix for CVA prophylaxis as well as subcutaneous Lovenox for DVT prophylaxis. Presently nothing by mouth await swallow study completed 05/26/2014 placed on a dysphagia 1 thin liquid diet. Occupational therapy evaluation completed 05/25/2014 with recommendations of physical medicine rehabilitation consult. Patient was admitted for comprehensive rehabilitation program  Review of Systems  Psychiatric/Behavioral: Positive for depression.   Review of Systems  Unable to perform ROS: language    Past Medical History  Diagnosis Date  . Hypertension   . GERD (gastroesophageal reflux disease)   .  Hypercholesteremia   . HOH (hard of hearing)    Past Surgical History  Procedure Laterality Date  . Shoulder surgery Right     torn cartiledge  . Esophagogastroduodenoscopy  10/28/2010    Procedure: ESOPHAGOGASTRODUODENOSCOPY (EGD);  Surgeon: [name], MD;  Location: [facility];  Service: Endoscopy;  Laterality: N/A;  10:30  . Colonoscopy    . Colonoscopy  05/17/2011    Procedure: COLONOSCOPY;  Surgeon: [name], MD;  Location: [facility];  Service: Endoscopy;  Laterality: N/A;  830  . Resection distal clavical Right 12/13/2012    Procedure: RESECTION DISTAL CLAVICAL;  Surgeon: Carole Civil, MD;  Location: [facility];  Service: Orthopedics;  Laterality: Right;  . Shoulder arthroscopy with bicepstenotomy Right 12/13/2012    Procedure: SHOULDER ARTHROSCOPY WITH BICEPS TENOTOMY, limited shoulder debridement;  Surgeon: Carole Civil, MD;  Location: [facility];  Service: Orthopedics;  Laterality: Right;  . Esophagogastroduodenoscopy N/A 04/24/2014    Procedure: ESOPHAGOGASTRODUODENOSCOPY (EGD);  Surgeon: [name], MD;  Location: [facility];  Service: Endoscopy;  Laterality: N/A;  32   Family History  Problem Relation Age of Onset  . Colon cancer Neg Hx    Social History:  reports that he quit smoking about 45 years ago. His smoking use included Cigarettes. He has a 17.5 pack-year smoking history. He does not have any smokeless tobacco history on file. He reports that he drinks about 1.8 oz of alcohol per week. He reports that he does not use illicit drugs. Allergies: No Known Allergies Medications Prior to Admission  Medication Sig Dispense Refill  . alfuzosin (UROXATRAL) 10 MG 24 hr tablet Take 10 mg by mouth daily with breakfast.    . amLODipine (NORVASC) 5 MG tablet Take 5 mg by mouth daily.    Marland Kitchen  atorvastatin (LIPITOR) 40 MG tablet Take 40 mg by mouth every evening.     Marland Kitchen esomeprazole (NEXIUM) 40 MG capsule Take 40 mg by mouth daily.    . hydrochlorothiazide  (HYDRODIURIL) 25 MG tablet Take 25 mg by mouth daily.    . meclizine (ANTIVERT) 25 MG tablet Take 25 mg by mouth 3 (three) times daily as needed for dizziness.    Marland Kitchen HYDROcodone-acetaminophen (NORCO) 7.5-325 MG per tablet Take 1 tablet by mouth every 4 (four) hours as needed for pain. (Patient not taking: Reported on 04/13/2014) 60 tablet 0  . methocarbamol (ROBAXIN) 500 MG tablet Take 1 tablet (500 mg total) by mouth 3 (three) times daily. (Patient not taking: Reported on 12/29/2013) 60 tablet 1  . promethazine (PHENERGAN) 12.5 MG tablet Take 1 tablet (12.5 mg total) by mouth every 6 (six) hours as needed for nausea. (Patient not taking: Reported on 12/29/2013) 30 tablet 0    Home: [place] expects to be discharged to:: Private residence Living Arrangements: Spouse/significant other Available Help at Discharge: Family, Available 24 hours/day Type of Home: House Home Access: Stairs to enter [employer] of Steps: 1 Entrance Stairs-Rails: None Home Layout: One level Home Equipment: None  Lives With: Spouse   Functional History: Prior Function Level of Independence: Independent Comments: loves to golf. Retired Health and safety inspector Status:  Mobility: Bed Mobility Overal bed mobility: Needs Assistance Bed Mobility: Supine to Sit Supine to sit: Mod assist, HOB elevated General bed mobility comments: A with bringing trunk up to sitting and scooting hips to EOB.  pt has difficulty following verbal directions, but does well with gestural cueing.   Transfers Overall transfer level: Needs assistance Equipment used: Rolling walker (2 wheeled) Transfers: Sit to/from Stand, Stand Pivot Transfers Sit to Stand: Min assist, +2 physical assistance, +2 safety/equipment Stand pivot transfers: Min assist, +2 physical assistance General transfer comment: pt impulsive and upon sight of RW kept reaching out for it and trying to standing despite multiple verbal and gestural  cues.  2nd person present for safety and to A with management of lines.  pt indicating needing to have BM, so pivoted to 3-in-1 without success.  RN aware.   Ambulation/Gait Ambulation/Gait assistance: Min assist, +2 physical assistance, +2 safety/equipment Ambulation Distance (Feet): 40 Feet (and 30) Assistive device: Rolling walker (2 wheeled) Gait Pattern/deviations: Step-through pattern, Decreased stride length, Decreased weight shift to left, Drifts right/left General Gait Details: pt needs A for management of RW, positioning within RW.  pt easily distracted and begins to drift to R side with outside distractions.  pt with audible wheezing during ambulation and worse as he fatigues, but O2 sats remained in 90's on RA.      ADL: ADL Overall ADL's : Needs assistance/impaired Eating/Feeding: NPO Grooming: Moderate assistance, Sitting (with mod cues) Upper Body Bathing: Minimal assitance, Sitting (with mod cues ) Lower Body Bathing: Moderate assistance (with +2 min A sit<>stand, mod cues ) Upper Body Dressing : Moderate assistance (with mod cues) Lower Body Dressing: Maximal assistance (with +2 min A sit<>stand; mod cues) Toilet Transfer: +2 for physical assistance, Ambulation, Minimal assistance (Bil HHA) Toilet Transfer Details (indicate cue type and reason): Bed> out door with eventual sitting in recliner behind him Toileting- Clothing Manipulation and Hygiene:  (mod cues)  Cognition: Cognition Overall Cognitive Status: Impaired/Different from baseline Arousal/Alertness: Awake/alert Orientation Level: Oriented to person, Other (comment), Oriented to situation (nods yes/no) Attention: Sustained Sustained Attention: Appears intact Memory:  (will assess in diagnostic treatment) Awareness:  Impaired Awareness Impairment: Intellectual impairment, Emergent impairment, Anticipatory impairment (did not exhibit frustration) Problem Solving:  (will asssess further) Safety/Judgment:  Impaired Comments:  Research scientist (life sciences) states pt attempting to get OOB) Cognition Arousal/Alertness: Awake/alert Behavior During Therapy: Impulsive Overall Cognitive Status: Impaired/Different from baseline Area of Impairment: Orientation, Attention, Following commands, Safety/judgement, Awareness, Problem solving Orientation Level: Disoriented to, Time, Situation (pt having trouble picking correct answer out of list of 3.) Current Attention Level: Focused Following Commands: Follows one step commands inconsistently Safety/Judgement: Decreased awareness of safety, Decreased awareness of deficits Awareness: Intellectual Problem Solving: Slow processing, Difficulty sequencing, Decreased initiation, Requires verbal cues, Requires tactile cues Difficult to assess due to: Impaired communication, Hard of hearing/deaf  Physical Exam: Blood pressure 130/58, pulse 46, temperature 98 F (36.7 C), temperature source Oral, resp. rate 21, height _0  (1.778 m), weight 101.7 kg (224 lb 3.3 oz), SpO2 100 %. Physical Exam HENT: oral mucosa pink/moist Head: Normocephalic. atraumatic Eyes: EOM are normal.  Neck: Normal range of motion. Neck supple. No thyromegaly present.  Cardiovascular: Normal rate and regular rhythm. no murmurs. No gallops Respiratory: Effort normal and breath sounds normal. No respiratory distress.  GI: Soft. Bowel sounds are normal. He exhibits no distension.  Musculoskeletal: He exhibits edema (RLE).  Neurological: right central 7, decreased oral-motor control Patient is alert and very impulsive. Aphasic but was able to state his name. Very inconsistent to follow commands---sometimes was close to completing given task. He displays minimal withdrawal to painful stimuli. Strength grossly 3 to 4-/5 but inconsistent due to apraxia, language issues. Perseverates on given tasks at times.  Skin: Skin is warm and dry.  Psychiatric:  Flat, non-distressed  Results for orders placed or performed  during the hospital encounter of 05/23/14 (from the past 48 hour(s))  BMET in AM     Status: Abnormal   Collection Time: 05/25/14  3:50 AM  Result Value Ref Range   Sodium 139 135 - 145 mmol/L   Potassium 3.8 3.5 - 5.1 mmol/L   Chloride 113 (H) 96 - 112 mmol/L   CO2 23 19 - 32 mmol/L   Glucose, Bld 105 (H) 70 - 99 mg/dL   BUN 17 6 - 23 mg/dL   Creatinine, Ser 0.88 0.50 - 1.35 mg/dL   Calcium 8.2 (L) 8.4 - 10.5 mg/dL   GFR calc non Af Amer 81 (L) >90 mL/min   GFR calc Af Amer >90 >90 mL/min    Comment: (NOTE) The eGFR has been calculated using the CKD EPI equation. This calculation has not been validated in all clinical situations. eGFR's persistently <90 mL/min signify possible Chronic Kidney Disease.    Anion gap 3 (L) 5 - 15  Platelet inhibition p2y12     Status: Abnormal   Collection Time: 05/25/14  3:50 AM  Result Value Ref Range   Platelet Function  P2Y12 144 (L) 194 - 418 PRU    Comment:        The literature has shown a direct correlation of PRU values over 230 with higher risks of thrombotic events.  Lower PRU values are associated with platelet inhibition.   Basic metabolic panel     Status: Abnormal   Collection Time: 05/26/14  2:40 AM  Result Value Ref Range   Sodium 143 135 - 145 mmol/L   Potassium 3.8 3.5 - 5.1 mmol/L   Chloride 114 (H) 96 - 112 mmol/L   CO2 23 19 - 32 mmol/L   Glucose, Bld 105 (H) 70 - 99 mg/dL  BUN 20 6 - 23 mg/dL   Creatinine, Ser 0.93 0.50 - 1.35 mg/dL   Calcium 8.4 8.4 - 10.5 mg/dL   GFR calc non Af Amer 79 (L) >90 mL/min   GFR calc Af Amer >90 >90 mL/min    Comment: (NOTE) The eGFR has been calculated using the CKD EPI equation. This calculation has not been validated in all clinical situations. eGFR's persistently <90 mL/min signify possible Chronic Kidney Disease.    Anion gap 6 5 - 15  CBC with Differential/Platelet     Status: Abnormal   Collection Time: 05/26/14  2:40 AM  Result Value Ref Range   WBC 9.5 4.0 - 10.5 K/uL    RBC 4.09 (L) 4.22 - 5.81 MIL/uL   Hemoglobin 11.2 (L) 13.0 - 17.0 g/dL   HCT 35.2 (L) 39.0 - 52.0 %   MCV 86.1 78.0 - 100.0 fL   MCH 27.4 26.0 - 34.0 pg   MCHC 31.8 30.0 - 36.0 g/dL   RDW 13.8 11.5 - 15.5 %   Platelets 187 150 - 400 K/uL   Neutrophils Relative % 70 43 - 77 %   Neutro Abs 6.6 1.7 - 7.7 K/uL   Lymphocytes Relative 19 12 - 46 %   Lymphs Abs 1.8 0.7 - 4.0 K/uL   Monocytes Relative 11 3 - 12 %   Monocytes Absolute 1.1 (H) 0.1 - 1.0 K/uL   Eosinophils Relative 0 0 - 5 %   Eosinophils Absolute 0.0 0.0 - 0.7 K/uL   Basophils Relative 0 0 - 1 %   Basophils Absolute 0.0 0.0 - 0.1 K/uL  Platelet inhibition p2y12     Status: Abnormal   Collection Time: 05/26/14  2:40 AM  Result Value Ref Range   Platelet Function  P2Y12 113 (L) 194 - 418 PRU    Comment:        The literature has shown a direct correlation of PRU values over 230 with higher risks of thrombotic events.  Lower PRU values are associated with platelet inhibition.    Mr Brain Wo Contrast  05/24/2014   CLINICAL DATA:  Syncopal episode, aphasic and RIGHT-sided weakness. Known acute LEFT middle cerebral artery territory infarct and LEFT ICA occlusion. History of hypertension and hyperlipidemia.  EXAM: MRI HEAD WITHOUT CONTRAST  MRA HEAD WITHOUT CONTRAST  TECHNIQUE: Multiplanar, multiecho pulse sequences of the brain and surrounding structures were obtained without intravenous contrast. Angiographic images of the head were obtained using MRA technique without contrast.  COMPARISON:  CT and CTA of the head May 23, 2014  FINDINGS: MRI HEAD FINDINGS  Motion degraded examination, severely motion degraded coronal T2. Patchy areas of reduced diffusion in LEFT frontal, temporal and parietal lobes, including posterior aspect of the LEFT putaminal and, subcentimeter focus of reduced diffusion LEFT corona radiata. Low ADC values. Largest area proximally 15 mm. No susceptibility artifact to suggest lobar hematoma. Tiny tubular  focus of susceptibility artifact in LEFT frontal lobe suggests flow void.  Moderate ventriculomegaly, likely on the basis of global parenchymal brain volume loss, with disproportionate sulcal effacement at the convexities. Confluent supratentorial white matter T2 hyperintensities in addition to T2 hyperintense signal associated with the acute ischemia. No midline shift, mass effect or mass lesions.  No abnormal extra-axial fluid collections. Ocular globes and orbital contents are unremarkable. Mild pan paranasal sinus mucosal thickening without air-fluid levels. Trace mastoid effusions. No abnormal sellar expansion. No cerebellar tonsillar ectopia. No suspicious bone marrow signal.  MRA HEAD FINDINGS  Anterior circulation:  Normal flow related enhancement of the included cervical, petrous, cavernous and supra clinoid internal carotid arteries bilaterally. Normal flow related enhancement anterior middle cerebral arteries.  Posterior circulation: RIGHT vertebral artery is dominant. Normal flow early enhancement of the vertebral arteries, vertebrobasilar junction, basilar artery and posterior cerebral arteries. Robust RIGHT, small LEFT posterior communicating arteries are present. Normal flow early enhancement posterior cerebral arteries.  No large vessel occlusion, high-grade stenosis, aneurysm, suspicious luminal irregularity.  IMPRESSION: MRI HEAD: Multi small areas of acute ischemia LEFT middle cerebral artery territory. Small vascular flow void, less likely micro hemorrhage LEFT posterior frontal lobe without lobar hematoma.  Involutional changes. Moderate to severe white matter changes suggest chronic small vessel ischemic disease.  Findings suggests of normal pressure hydrocephalus.  MRA HEAD: Recannulized LEFT internal carotid artery. Normal MRA of the head, complete circle of Willis.   Electronically Signed   By: Elon Alas   On: 05/24/2014 18:45   Dg Chest Port 1 View  05/26/2014   CLINICAL DATA:   Cerebral vascular accident  EXAM: PORTABLE CHEST - 1 VIEW  COMPARISON:  May 25, 2014  FINDINGS: Endotracheal tube and nasogastric tube have been removed. No pneumothorax. No edema or consolidation. Heart is mildly enlarged with pulmonary vascularity within normal limits. There is atherosclerotic change in the aortic arch. No adenopathy.  IMPRESSION: No edema or consolidation. No pneumothorax. Stable cardiac prominence.   Electronically Signed   By: Lowella Grip III M.D.   On: 05/26/2014 08:14   Dg Chest Port 1 View  05/25/2014   CLINICAL DATA:  80 year old male with a history of respiratory failure.  EXAM: PORTABLE CHEST - 1 VIEW  COMPARISON:  05/23/2014, 12/29/2013  FINDINGS: Left rotation of the patient.  Widened superior mediastinum, similar to the comparison, accentuated by the rotation of the patient.  Heart size unchanged.  Calcifications of the aortic arch.  Low lung volumes accentuating the interstitium.  Mixed interstitial and airspace disease of the bilateral infrahilar regions, particularly on the left.  No pneumothorax.  Endotracheal tube terminates approximately 2.9 cm above the carina.  Unchanged gastric tube, terminating in the upper abdomen.  IMPRESSION: Endotracheal tube similar to comparison, terminating approximately 2.9 cm above the carina. Unchanged gastric tube.  Low lung volumes, with airspace and interstitial opacities potentially reflecting atelectasis and/ or consolidation.  Widened mediastinum, accentuated by the rotation of the patient. If there were concern for acute mediastinal or vascular abnormality, CT would be advised.  Atherosclerosis  Signed,  Dulcy Fanny. Earleen Newport, DO  [facility]  [facility]   Electronically Signed   By: Corrie Mckusick D.O.   On: 05/25/2014 08:24   Mr Jodene Nam Head/brain Wo Cm  05/24/2014   CLINICAL DATA:  Syncopal episode, aphasic and RIGHT-sided weakness. Known acute LEFT middle cerebral artery territory infarct  and LEFT ICA occlusion. History of hypertension and hyperlipidemia.  EXAM: MRI HEAD WITHOUT CONTRAST  MRA HEAD WITHOUT CONTRAST  TECHNIQUE: Multiplanar, multiecho pulse sequences of the brain and surrounding structures were obtained without intravenous contrast. Angiographic images of the head were obtained using MRA technique without contrast.  COMPARISON:  CT and CTA of the head May 23, 2014  FINDINGS: MRI HEAD FINDINGS  Motion degraded examination, severely motion degraded coronal T2. Patchy areas of reduced diffusion in LEFT frontal, temporal and parietal lobes, including posterior aspect of the LEFT putaminal and, subcentimeter focus of reduced diffusion LEFT corona radiata. Low ADC values. Largest area proximally 15 mm. No susceptibility artifact to suggest  lobar hematoma. Tiny tubular focus of susceptibility artifact in LEFT frontal lobe suggests flow void.  Moderate ventriculomegaly, likely on the basis of global parenchymal brain volume loss, with disproportionate sulcal effacement at the convexities. Confluent supratentorial white matter T2 hyperintensities in addition to T2 hyperintense signal associated with the acute ischemia. No midline shift, mass effect or mass lesions.  No abnormal extra-axial fluid collections. Ocular globes and orbital contents are unremarkable. Mild pan paranasal sinus mucosal thickening without air-fluid levels. Trace mastoid effusions. No abnormal sellar expansion. No cerebellar tonsillar ectopia. No suspicious bone marrow signal.  MRA HEAD FINDINGS  Anterior circulation: Normal flow related enhancement of the included cervical, petrous, cavernous and supra clinoid internal carotid arteries bilaterally. Normal flow related enhancement anterior middle cerebral arteries.  Posterior circulation: RIGHT vertebral artery is dominant. Normal flow early enhancement of the vertebral arteries, vertebrobasilar junction, basilar artery and posterior cerebral arteries. Robust RIGHT, small  LEFT posterior communicating arteries are present. Normal flow early enhancement posterior cerebral arteries.  No large vessel occlusion, high-grade stenosis, aneurysm, suspicious luminal irregularity.  IMPRESSION: MRI HEAD: Multi small areas of acute ischemia LEFT middle cerebral artery territory. Small vascular flow void, less likely micro hemorrhage LEFT posterior frontal lobe without lobar hematoma.  Involutional changes. Moderate to severe white matter changes suggest chronic small vessel ischemic disease.  Findings suggests of normal pressure hydrocephalus.  MRA HEAD: Recannulized LEFT internal carotid artery. Normal MRA of the head, complete circle of Willis.   Electronically Signed   By: Elon Alas   On: 05/24/2014 18:45       Medical Problem List and Plan: 1. Functional deficits secondary to left MCA territory infarct with ICA occlusion status post mechanical thrombectomy with left ICA stenting, embolic secondary to large vessel atherosclerosis 2.  DVT Prophylaxis/Anticoagulation: Subcutaneous Lovenox. Monitor platelet counts and any signs of bleeding. Venous Doppler studies 05/25/2014 negative 3. Pain Management: Tylenol as needed 4. Dysphagia. Dysphagia 1 thin liquid diet. Follow-up speech therapy 5. Neuropsych: This patient is capable of making decisions on his own behalf. 6. Skin/Wound Care: Routine skin checks 7. Fluids/Electrolytes/Nutrition: Strict I&O's with follow-up chemistries 8. Hyperlipidemia. Lipitor 9. Hypertension. Allowing for permissive hypertension. No present antihypertensive medication. Patient on Norvasc 5 mg daily, hydrochlorothiazide 25 mg daily prior to admission. Resume as tolerated 10. GERD. Protonix   Post Admission Physician Evaluation: 1. Functional deficits secondary  to left MCA infarct. 2. Patient is admitted to receive collaborative, interdisciplinary care between the physiatrist, rehab nursing staff, and therapy team. 3. Patient's level of  medical complexity and substantial therapy needs in context of that medical necessity cannot be provided at a lesser intensity of care such as a SNF. 4. Patient has experienced substantial functional loss from his/her baseline which was documented above under the "Functional History" and "Functional Status" headings.  Judging by the patient's diagnosis, physical exam, and functional history, the patient has potential for functional progress which will result in measurable gains while on inpatient rehab.  These gains will be of substantial and practical use upon discharge  in facilitating mobility and self-care at the household level. 5. Physiatrist will provide 24 hour management of medical needs as well as oversight of the therapy plan/treatment and provide guidance as appropriate regarding the interaction of the two. 6. 24 hour rehab nursing will assist with bladder management, bowel management, safety, skin/wound care, disease management, medication administration, pain management and patient education  and help integrate therapy concepts, techniques,education, etc. 7. PT will assess and treat for/with: Lower  extremity strength, range of motion, stamina, balance, functional mobility, safety, adaptive techniques and equipment, NMR, cognitive linguistic integration, pain control, ego support, family ed.   Goals are: supervision to min assist. 8. OT will assess and treat for/with: ADL's, functional mobility, safety, upper extremity strength, adaptive techniques and equipment, NMR, cognitive linguistic integration, ego support, family education.   Goals are: supervision to min assist. Therapy may proceed with showering this patient. 9. SLP will assess and treat for/with: language, cognition, swallowing, communications.  Goals are: min to max assist. 10. Case Management and Social Worker will assess and treat for psychological issues and discharge planning. 11. Team conference will be held weekly to assess  progress toward goals and to determine barriers to discharge. 12. Patient will receive at least 3 hours of therapy per day at least 5 days per week. 13. ELOS: 11-16 days       14. Prognosis:  excellent     Meredith Staggers, MD, [facility] 05/26/2014   05/26/2014

## 2014-05-26 NOTE — Consult Note (Signed)
Physical Medicine and Rehabilitation Consult Reason for Consult: Left MCA territory infarct Referring Physician: Dr.Xu    HPI: Henry [name] is a 80 y.o.right handed  male with history of hypertension, hyperlipidemia. Independent prior to admission living with his wife. Presented 05/23/2014 to [place] with syncopal episode with findings of aphasia as well as right-sided weakness. Patient did not receive TPA. Initial cranial CT scan showed no acute intracranial abnormality. CT angio of the head showed nonvisualization of the left trocar artery, occluded proximally within the neck. Patient was transferred [facility] for ongoing care. CT cerebral perfusion scan showed areas of ischemic core infarct within the left MCA territory. Echocardiogram with ejection fraction of 01% grade 1 diastolic dysfunction. Venous Doppler study with no signs of DVT. Carotid arteriogram completed followed by revascularization of acutely occluded left ICA proximal with stent assisted angioplasty per interventional radiology 05/23/2014. Patient initially maintained on ventilator followed by critical care medicine for a short time. Follow-up MRI of the brain 05/24/2014 showing multiple small areas of acute ischemic left middle cerebral artery territory. Neurology follow-up currently maintained on aspirin and Plavix for CVA prophylaxis as well as subcutaneous Lovenox for DVT prophylaxis. Presently nothing by mouth await swallow study. Occupational therapy evaluation completed 05/25/2014 with recommendations of physical medicine rehabilitation consult.   Review of Systems  Unable to perform ROS: language   Past Medical History  Diagnosis Date  . Hypertension   . GERD (gastroesophageal reflux disease)   . Hypercholesteremia   . HOH (hard of hearing)    Past Surgical History  Procedure Laterality Date  . Shoulder surgery Right     torn cartiledge  . Esophagogastroduodenoscopy  10/28/2010   Procedure: ESOPHAGOGASTRODUODENOSCOPY (EGD);  Surgeon: [name], MD;  Location: [facility];  Service: Endoscopy;  Laterality: N/A;  10:30  . Colonoscopy    . Colonoscopy  05/17/2011    Procedure: COLONOSCOPY;  Surgeon: [name], MD;  Location: [facility];  Service: Endoscopy;  Laterality: N/A;  830  . Resection distal clavical Right 12/13/2012    Procedure: RESECTION DISTAL CLAVICAL;  Surgeon: Carole Civil, MD;  Location: [facility];  Service: Orthopedics;  Laterality: Right;  . Shoulder arthroscopy with bicepstenotomy Right 12/13/2012    Procedure: SHOULDER ARTHROSCOPY WITH BICEPS TENOTOMY, limited shoulder debridement;  Surgeon: Carole Civil, MD;  Location: [facility];  Service: Orthopedics;  Laterality: Right;  . Esophagogastroduodenoscopy N/A 04/24/2014    Procedure: ESOPHAGOGASTRODUODENOSCOPY (EGD);  Surgeon: [name], MD;  Location: [facility];  Service: Endoscopy;  Laterality: N/A;  7   Family History  Problem Relation Age of Onset  . Colon cancer Neg Hx    Social History:  reports that he quit smoking about 45 years ago. His smoking use included Cigarettes. He has a 17.5 pack-year smoking history. He does not have any smokeless tobacco history on file. He reports that he drinks about 1.8 oz of alcohol per week. He reports that he does not use illicit drugs. Allergies: No Known Allergies Medications Prior to Admission  Medication Sig Dispense Refill  . alfuzosin (UROXATRAL) 10 MG 24 hr tablet Take 10 mg by mouth daily with breakfast.    . amLODipine (NORVASC) 5 MG tablet Take 5 mg by mouth daily.    Marland Kitchen atorvastatin (LIPITOR) 40 MG tablet Take 40 mg by mouth every evening.     Marland Kitchen esomeprazole (NEXIUM) 40 MG capsule Take 40 mg by mouth daily.    Marland Kitchen  hydrochlorothiazide (HYDRODIURIL) 25 MG tablet Take 25 mg by mouth daily.    . meclizine (ANTIVERT) 25 MG tablet Take 25 mg by mouth 3 (three) times daily as needed for dizziness.    Marland Kitchen HYDROcodone-acetaminophen  (NORCO) 7.5-325 MG per tablet Take 1 tablet by mouth every 4 (four) hours as needed for pain. (Patient not taking: Reported on 04/13/2014) 60 tablet 0  . methocarbamol (ROBAXIN) 500 MG tablet Take 1 tablet (500 mg total) by mouth 3 (three) times daily. (Patient not taking: Reported on 12/29/2013) 60 tablet 1  . promethazine (PHENERGAN) 12.5 MG tablet Take 1 tablet (12.5 mg total) by mouth every 6 (six) hours as needed for nausea. (Patient not taking: Reported on 12/29/2013) 30 tablet 0    Home: [place] expects to be discharged to:: Private residence Living Arrangements: Spouse/significant other Available Help at Discharge: Family, Available 24 hours/day Type of Home: House Home Access: Stairs to enter [employer] of Steps: 1 Entrance Stairs-Rails: None Home Layout: One level Home Equipment: None  Lives With: Spouse  Functional History: Prior Function Level of Independence: Independent Comments: loves to golf. Retired Scientific laboratory technician Status:  Mobility: Bed Mobility Overal bed mobility: Needs Assistance Bed Mobility: Supine to Sit Supine to sit: Mod assist, +2 for safety/equipment General bed mobility comments: +another to assist with lines Transfers Overall transfer level: Needs assistance Equipment used: 2 person hand held assist Transfers: Sit to/from Stand Sit to Stand: Min assist, +2 safety/equipment General transfer comment: assist for safety/balance; pt initially impulsive requiring verbal, tactile, gesturing cues for safety Ambulation/Gait Ambulation/Gait assistance: Min assist, +2 physical assistance Ambulation Distance (Feet): 35 Feet Assistive device: 2 person hand held assist Gait Pattern/deviations: Step-through pattern, Shuffle, Decreased stride length, Wide base of support General Gait Details: self limited, but unsure if pt dizzy/lightheaded or just confused due to communication deficits    ADL: ADL Overall ADL's : Needs  assistance/impaired Eating/Feeding: NPO Grooming: Moderate assistance, Sitting (with mod cues) Upper Body Bathing: Minimal assitance, Sitting (with mod cues ) Lower Body Bathing: Moderate assistance (with +2 min A sit<>stand, mod cues ) Upper Body Dressing : Moderate assistance (with mod cues) Lower Body Dressing: Maximal assistance (with +2 min A sit<>stand; mod cues) Toilet Transfer: +2 for physical assistance, Ambulation, Minimal assistance (Bil HHA) Toilet Transfer Details (indicate cue type and reason): Bed> out door with eventual sitting in recliner behind him Toileting- Clothing Manipulation and Hygiene:  (mod cues)  Cognition: Cognition Overall Cognitive Status: Impaired/Different from baseline Arousal/Alertness: Awake/alert Orientation Level: Oriented to person, Oriented to place (assessed by pt nodding "yes" or "no") Attention: Sustained Sustained Attention: Appears intact Memory:  (will assess in diagnostic treatment) Awareness: Impaired Awareness Impairment: Intellectual impairment, Emergent impairment, Anticipatory impairment (did not exhibit frustration) Problem Solving:  (will asssess further) Safety/Judgment: Impaired Comments:  Research scientist (life sciences) states pt attempting to get OOB) Cognition Arousal/Alertness: Awake/alert Behavior During Therapy: Impulsive Overall Cognitive Status: Impaired/Different from baseline Area of Impairment: Orientation, Attention, Following commands, Safety/judgement, Problem solving Orientation Level:  (intially nodded yes to being born in Jan, but then with time chose Sept. Nodded yes to 2014) Current Attention Level: Sustained Following Commands: Follows one step commands inconsistently, Follows one step commands with increased time Safety/Judgement: Decreased awareness of safety, Decreased awareness of deficits Problem Solving: Requires verbal cues, Requires tactile cues Difficult to assess due to: Impaired communication, Hard of  hearing/deaf  Blood pressure 112/56, pulse 46, temperature 98.3 F (36.8 C), temperature source Oral, resp. rate 27, height 5\' 10"  (1.778  m), weight 101.7 kg (224 lb 3.3 oz), SpO2 96 %. Physical Exam  HENT:  Head: Normocephalic.  Eyes: EOM are normal.  Neck: Normal range of motion. Neck supple. No thyromegaly present.  Cardiovascular: Normal rate and regular rhythm.   Respiratory: Effort normal and breath sounds normal. No respiratory distress.  GI: Soft. Bowel sounds are normal. He exhibits no distension.  Musculoskeletal: He exhibits edema (RLE).  Neurological:  Patient is alert and very impulsive. Aphasic but was able to state his name. Very inconsistent to follow commands---sometimes was close to completing given task. He displays minimal withdrawal to painful stimuli. Strength grossly 3 to 4-/5 but inconsistent due to apraxia, language issues. Perseverates on given tasks at times.   Skin: Skin is warm and dry.  Psychiatric:  Flat, non-distressed    Results for orders placed or performed during the hospital encounter of 05/23/14 (from the past 24 hour(s))  Basic metabolic panel     Status: Abnormal   Collection Time: 05/26/14  2:40 AM  Result Value Ref Range   Sodium 143 135 - 145 mmol/L   Potassium 3.8 3.5 - 5.1 mmol/L   Chloride 114 (H) 96 - 112 mmol/L   CO2 23 19 - 32 mmol/L   Glucose, Bld 105 (H) 70 - 99 mg/dL   BUN 20 6 - 23 mg/dL   Creatinine, Ser 0.93 0.50 - 1.35 mg/dL   Calcium 8.4 8.4 - 10.5 mg/dL   GFR calc non Af Amer 79 (L) >90 mL/min   GFR calc Af Amer >90 >90 mL/min   Anion gap 6 5 - 15  CBC with Differential/Platelet     Status: Abnormal   Collection Time: 05/26/14  2:40 AM  Result Value Ref Range   WBC 9.5 4.0 - 10.5 K/uL   RBC 4.09 (L) 4.22 - 5.81 MIL/uL   Hemoglobin 11.2 (L) 13.0 - 17.0 g/dL   HCT 35.2 (L) 39.0 - 52.0 %   MCV 86.1 78.0 - 100.0 fL   MCH 27.4 26.0 - 34.0 pg   MCHC 31.8 30.0 - 36.0 g/dL   RDW 13.8 11.5 - 15.5 %   Platelets 187 150 -  400 K/uL   Neutrophils Relative % 70 43 - 77 %   Neutro Abs 6.6 1.7 - 7.7 K/uL   Lymphocytes Relative 19 12 - 46 %   Lymphs Abs 1.8 0.7 - 4.0 K/uL   Monocytes Relative 11 3 - 12 %   Monocytes Absolute 1.1 (H) 0.1 - 1.0 K/uL   Eosinophils Relative 0 0 - 5 %   Eosinophils Absolute 0.0 0.0 - 0.7 K/uL   Basophils Relative 0 0 - 1 %   Basophils Absolute 0.0 0.0 - 0.1 K/uL  Platelet inhibition p2y12     Status: Abnormal   Collection Time: 05/26/14  2:40 AM  Result Value Ref Range   Platelet Function  P2Y12 113 (L) 194 - 418 PRU   Mr Brain Wo Contrast  05/24/2014   CLINICAL DATA:  Syncopal episode, aphasic and RIGHT-sided weakness. Known acute LEFT middle cerebral artery territory infarct and LEFT ICA occlusion. History of hypertension and hyperlipidemia.  EXAM: MRI HEAD WITHOUT CONTRAST  MRA HEAD WITHOUT CONTRAST  TECHNIQUE: Multiplanar, multiecho pulse sequences of the brain and surrounding structures were obtained without intravenous contrast. Angiographic images of the head were obtained using MRA technique without contrast.  COMPARISON:  CT and CTA of the head May 23, 2014  FINDINGS: MRI HEAD FINDINGS  Motion degraded examination, severely motion  degraded coronal T2. Patchy areas of reduced diffusion in LEFT frontal, temporal and parietal lobes, including posterior aspect of the LEFT putaminal and, subcentimeter focus of reduced diffusion LEFT corona radiata. Low ADC values. Largest area proximally 15 mm. No susceptibility artifact to suggest lobar hematoma. Tiny tubular focus of susceptibility artifact in LEFT frontal lobe suggests flow void.  Moderate ventriculomegaly, likely on the basis of global parenchymal brain volume loss, with disproportionate sulcal effacement at the convexities. Confluent supratentorial white matter T2 hyperintensities in addition to T2 hyperintense signal associated with the acute ischemia. No midline shift, mass effect or mass lesions.  No abnormal extra-axial fluid  collections. Ocular globes and orbital contents are unremarkable. Mild pan paranasal sinus mucosal thickening without air-fluid levels. Trace mastoid effusions. No abnormal sellar expansion. No cerebellar tonsillar ectopia. No suspicious bone marrow signal.  MRA HEAD FINDINGS  Anterior circulation: Normal flow related enhancement of the included cervical, petrous, cavernous and supra clinoid internal carotid arteries bilaterally. Normal flow related enhancement anterior middle cerebral arteries.  Posterior circulation: RIGHT vertebral artery is dominant. Normal flow early enhancement of the vertebral arteries, vertebrobasilar junction, basilar artery and posterior cerebral arteries. Robust RIGHT, small LEFT posterior communicating arteries are present. Normal flow early enhancement posterior cerebral arteries.  No large vessel occlusion, high-grade stenosis, aneurysm, suspicious luminal irregularity.  IMPRESSION: MRI HEAD: Multi small areas of acute ischemia LEFT middle cerebral artery territory. Small vascular flow void, less likely micro hemorrhage LEFT posterior frontal lobe without lobar hematoma.  Involutional changes. Moderate to severe white matter changes suggest chronic small vessel ischemic disease.  Findings suggests of normal pressure hydrocephalus.  MRA HEAD: Recannulized LEFT internal carotid artery. Normal MRA of the head, complete circle of Willis.   Electronically Signed   By: Elon Alas   On: 05/24/2014 18:45   Dg Chest Port 1 View  05/25/2014   CLINICAL DATA:  80 year old male with a history of respiratory failure.  EXAM: PORTABLE CHEST - 1 VIEW  COMPARISON:  05/23/2014, 12/29/2013  FINDINGS: Left rotation of the patient.  Widened superior mediastinum, similar to the comparison, accentuated by the rotation of the patient.  Heart size unchanged.  Calcifications of the aortic arch.  Low lung volumes accentuating the interstitium.  Mixed interstitial and airspace disease of the bilateral  infrahilar regions, particularly on the left.  No pneumothorax.  Endotracheal tube terminates approximately 2.9 cm above the carina.  Unchanged gastric tube, terminating in the upper abdomen.  IMPRESSION: Endotracheal tube similar to comparison, terminating approximately 2.9 cm above the carina. Unchanged gastric tube.  Low lung volumes, with airspace and interstitial opacities potentially reflecting atelectasis and/ or consolidation.  Widened mediastinum, accentuated by the rotation of the patient. If there were concern for acute mediastinal or vascular abnormality, CT would be advised.  Atherosclerosis  Signed,  Dulcy Fanny. Earleen Newport, DO  [facility]  [facility]   Electronically Signed   By: Corrie Mckusick D.O.   On: 05/25/2014 08:24   Mr Jodene Nam Head/brain Wo Cm  05/24/2014   CLINICAL DATA:  Syncopal episode, aphasic and RIGHT-sided weakness. Known acute LEFT middle cerebral artery territory infarct and LEFT ICA occlusion. History of hypertension and hyperlipidemia.  EXAM: MRI HEAD WITHOUT CONTRAST  MRA HEAD WITHOUT CONTRAST  TECHNIQUE: Multiplanar, multiecho pulse sequences of the brain and surrounding structures were obtained without intravenous contrast. Angiographic images of the head were obtained using MRA technique without contrast.  COMPARISON:  CT and CTA of the head May 23, 2014  FINDINGS: MRI HEAD FINDINGS  Motion degraded examination, severely motion degraded coronal T2. Patchy areas of reduced diffusion in LEFT frontal, temporal and parietal lobes, including posterior aspect of the LEFT putaminal and, subcentimeter focus of reduced diffusion LEFT corona radiata. Low ADC values. Largest area proximally 15 mm. No susceptibility artifact to suggest lobar hematoma. Tiny tubular focus of susceptibility artifact in LEFT frontal lobe suggests flow void.  Moderate ventriculomegaly, likely on the basis of global parenchymal brain volume loss, with disproportionate  sulcal effacement at the convexities. Confluent supratentorial white matter T2 hyperintensities in addition to T2 hyperintense signal associated with the acute ischemia. No midline shift, mass effect or mass lesions.  No abnormal extra-axial fluid collections. Ocular globes and orbital contents are unremarkable. Mild pan paranasal sinus mucosal thickening without air-fluid levels. Trace mastoid effusions. No abnormal sellar expansion. No cerebellar tonsillar ectopia. No suspicious bone marrow signal.  MRA HEAD FINDINGS  Anterior circulation: Normal flow related enhancement of the included cervical, petrous, cavernous and supra clinoid internal carotid arteries bilaterally. Normal flow related enhancement anterior middle cerebral arteries.  Posterior circulation: RIGHT vertebral artery is dominant. Normal flow early enhancement of the vertebral arteries, vertebrobasilar junction, basilar artery and posterior cerebral arteries. Robust RIGHT, small LEFT posterior communicating arteries are present. Normal flow early enhancement posterior cerebral arteries.  No large vessel occlusion, high-grade stenosis, aneurysm, suspicious luminal irregularity.  IMPRESSION: MRI HEAD: Multi small areas of acute ischemia LEFT middle cerebral artery territory. Small vascular flow void, less likely micro hemorrhage LEFT posterior frontal lobe without lobar hematoma.  Involutional changes. Moderate to severe white matter changes suggest chronic small vessel ischemic disease.  Findings suggests of normal pressure hydrocephalus.  MRA HEAD: Recannulized LEFT internal carotid artery. Normal MRA of the head, complete circle of Willis.   Electronically Signed   By: Elon Alas   On: 05/24/2014 18:45    Assessment/Plan: Diagnosis: left MCA infarct 1. Does the need for close, 24 hr/day medical supervision in concert with the patient's rehab needs make it unreasonable for this patient to be served in a less intensive setting?  Yes 2. Co-Morbidities requiring supervision/potential complications: respiratory failure, htn 3. Due to bladder management, bowel management, safety, skin/wound care, disease management, medication administration, pain management and patient education, does the patient require 24 hr/day rehab nursing? Yes 4. Does the patient require coordinated care of a physician, rehab nurse, PT (1-2 hrs/day, 5 days/week), OT (1-2 hrs/day, 5 days/week) and SLP (1-2 hrs/day, 5 days/week) to address physical and functional deficits in the context of the above medical diagnosis(es)? Yes Addressing deficits in the following areas: balance, endurance, locomotion, strength, transferring, bowel/bladder control, bathing, dressing, feeding, grooming, toileting, cognition, speech, language, swallowing and psychosocial support 5. Can the patient actively participate in an intensive therapy program of at least 3 hrs of therapy per day at least 5 days per week? Yes 6. The potential for patient to make measurable gains while on inpatient rehab is excellent 7. Anticipated functional outcomes upon discharge from inpatient rehab are supervision  with PT, supervision with OT, supervision, min assist and mod assist with SLP. 8. Estimated rehab length of stay to reach the above functional goals is: 10-14 days 9. Does the patient have adequate social supports and living environment to accommodate these discharge functional goals? Yes 10. Anticipated D/C setting: Home 11. Anticipated post D/C treatments: HH therapy and Outpatient therapy 12. Overall Rehab/Functional Prognosis: excellent  RECOMMENDATIONS: This patient's condition is appropriate for continued rehabilitative care in the following setting:  CIR Patient has agreed to participate in recommended program. Yes Note that insurance prior authorization may be required for reimbursement for recommended care.  Comment: Rehab Admissions Coordinator to follow  up.  Thanks,  Meredith Staggers, MD, Mellody Drown     05/26/2014

## 2014-05-26 NOTE — Progress Notes (Signed)
Medicare IM (Important Message) delivered to patient's room today by me in anticipation of discharge. His wife and daughter were present and stated understanding of form.  Sandi Mariscal, RN BSN MHA CCM  Case Manager, Trauma Service/Unit [facility] [phone]

## 2014-05-26 NOTE — Progress Notes (Signed)
Speech Language Pathology  Patient Details Name: Henry [name] MRN: [id number] DOB: 10-23-1934 Today's Date: 05/26/2014 Time: 9024-0973 SLP Time Calculation (min) (ACUTE ONLY): 25 min      MBS complete. Please see results and recommendations under chart review, then imaging and click on swallow function.      Orbie Pyo [place].Ed [employer] [phone]

## 2014-05-26 NOTE — Progress Notes (Signed)
Speech Language Pathology    Patient Details Name: Henry [name] MRN: [id number] DOB: 1934-07-05 Today's Date: 05/26/2014 Time:  -      MBS today at 11:30    [place] Mon.[employer] [phone]

## 2014-05-26 NOTE — PMR Pre-admission (Signed)
PMR Admission Coordinator Pre-Admission Assessment  Patient: Henry [name] is an 80 y.o., male MRN: [id number] DOB: 12-04-1934 Height: 5\' 10"  (177.8 cm) Weight: 101.7 kg (224 lb 3.3 oz)              Insurance Information HMO:     PPO:      PCP:      IPA:      80/20:      OTHER:  PRIMARY:  Medicare A and B      Policy#:  [id number]      Subscriber:  self CM Name:        Phone#:      Fax#:  Pre-Cert#:       Employer: retired Benefits:  Phone #:      Name:  Eff. Date: 10/14/01     Deduct: $1288      Out of Pocket Max:       Life Max: no CIR:  100%      SNF:  100% first 20 days Outpatient:  80%/20%     Co-Pay:  Home Health:  100%      Co-Pay:  DME:  80%/20%     Co-Pay:  Providers:  Pt. choice SECONDARY:  Tricare for Life      Policy#: [id number]       Subscriber:  self CM Name:       Phone#:      Fax#:  Pre-Cert#:       Employer:  Benefits:  Phone #:      Name:  Eff. Date:      Deduct:       Out of Pocket Max:       Life Max:  CIR:       SNF:  Outpatient:      Co-Pay:  Home Health:       Co-Pay:  DME:      Co-Pay:   Medicaid Application Date:       Case Manager:  Disability Application Date:       Case Worker:   Emergency Contact Information Contact Information    Name Relation Home Work Mobile   [place] Spouse [phone]     nt Medical History  Patient Admitting Diagnosis: left MCA infarct History of Present Illness: Henry [name] is a 80 y.o.right handed male with history of hypertension, hyperlipidemia. Independent prior to admission living with his wife. Presented 05/23/2014 to [facility] with syncopal episode with findings of aphasia as well as right-sided weakness. Patient did not receive TPA. Initial cranial CT scan showed no acute intracranial abnormality. CT angio of the head showed nonvisualization of the left trocar artery, occluded proximally within the neck. Patient was transferred to [facility] for ongoing care. CT  cerebral perfusion scan showed areas of ischemic core infarct within the left MCA territory. Echocardiogram with ejection fraction of 32% grade 1 diastolic dysfunction. Venous Doppler study with no signs of DVT. Carotid arteriogram completed followed by revascularization of acutely occluded left ICA proximal with stent assisted angioplasty per interventional radiology 05/23/2014. Patient initially maintained on ventilator followed by critical care medicine for a short time. Follow-up MRI of the brain 05/24/2014 showing multiple small areas of acute ischemic left middle cerebral artery territory. Neurology follow-up currently maintained on aspirin and Plavix for CVA prophylaxis as well as subcutaneous Lovenox for DVT prophylaxis. Presently nothing by mouth await swallow study completed 05/26/2014 placed on a dysphagia 1 thin liquid diet. Occupational therapy evaluation completed  05/25/2014 with recommendations of physical medicine rehabilitation consult. Patient was admitted for comprehensive rehabilitation program Total: 9 [facility]    Past Medical History  Past Medical History  Diagnosis Date  . Hypertension   . GERD (gastroesophageal reflux disease)   . Hypercholesteremia   . HOH (hard of hearing)     Family History  family history is negative for Colon cancer.  Prior Rehab/Hospitalizations: OPPT for shoulder injury   Current Medications   Current facility-administered medications:  .   stroke: mapping our early stages of recovery book, , Does not apply, Once, Drema Dallas, DO .  acetaminophen (TYLENOL) tablet 1,000 mg, 1,000 mg, Oral, Q6H PRN **OR** acetaminophen (TYLENOL) suppository 650 mg, 650 mg, Rectal, Q6H PRN, Luanne Bras, MD .  albuterol (PROVENTIL) (2.5 MG/3ML) 0.083% nebulizer solution 2.5 mg, 2.5 mg, Nebulization, Q2H PRN, Melvenia Needles, NP, 2.5 mg at 05/25/14 2131 .  antiseptic oral rinse (CPC / CETYLPYRIDINIUM CHLORIDE 0.05%) solution 7 mL, 7 mL, Mouth Rinse, QID, Tammy S  Parrett, NP, 7 mL at 05/26/14 1304 .  aspirin EC tablet 81 mg, 81 mg, Oral, Daily, Rosalin Hawking, MD, 81 mg at 05/26/14 1302 .  atorvastatin (LIPITOR) tablet 40 mg, 40 mg, Oral, QPM, Rosalin Hawking, MD, 40 mg at 05/25/14 1711 .  chlorhexidine (PERIDEX) 0.12 % solution 15 mL, 15 mL, Mouth Rinse, BID, Tammy S Parrett, NP, 15 mL at 05/25/14 2052 .  clopidogrel (PLAVIX) tablet 75 mg, 75 mg, Oral, Q breakfast, Luanne Bras, MD, 75 mg at 05/26/14 1300 .  enoxaparin (LOVENOX) injection 40 mg, 40 mg, Subcutaneous, Q24H, Rosalin Hawking, MD, 40 mg at 05/25/14 1727 .  ondansetron (ZOFRAN) injection 4 mg, 4 mg, Intravenous, Q6H PRN, Luanne Bras, MD .  pantoprazole (PROTONIX) injection 40 mg, 40 mg, Intravenous, Daily, Tammy S Parrett, NP, 40 mg at 05/26/14 1030  Patients Current Diet: Diet NPO time specified  Precautions / Restrictions Precautions Precautions: Fall Precaution Comments: h/o dizziness in standing per family (?vestibular versus orthostatic issues) Restrictions Weight Bearing Restrictions: No   Prior Activity Level Community (5-7x/wk): Wife reports pt. goes to breakfast with his buddies most days at the [employer] in [place] and will occasionally stay through lunch.  Pt. has been walking 3 miles per day for the last 6 weeks in order to help a buddy recover from his back surgery [place] / Equipment Home Equipment: None  Prior Functional Level Prior Function Level of Independence: Independent Comments: loves to golf, retired Network engineer  Current Functional Level Cognition  Arousal/Alertness: Awake/alert Overall Cognitive Status: Impaired/Different from baseline Difficult to assess due to: Impaired communication, Hard of hearing/deaf Current Attention Level: Focused Orientation Level: Oriented to person, Other (comment), Oriented to situation (nods yes/no) Following Commands: Follows one step commands inconsistently Safety/Judgement: Decreased awareness  of safety, Decreased awareness of deficits Attention: Sustained Sustained Attention: Appears intact Memory:  (will assess in diagnostic treatment) Awareness: Impaired Awareness Impairment: Intellectual impairment, Emergent impairment, Anticipatory impairment (did not exhibit frustration) Problem Solving:  (will asssess further) Safety/Judgment: Impaired Comments:  Research scientist (life sciences) states pt attempting to get OOB)    Extremity Assessment (includes Sensation/Coordination)  Upper Extremity Assessment: RUE deficits/detail RUE Deficits / Details: moves slower than the left RUE Coordination: decreased fine motor, decreased gross motor  Lower Extremity Assessment: RLE deficits/detail RLE Deficits / Details: moves antigravity; slower to respond in testing ankle DF    ADLs  Overall ADL's : Needs assistance/impaired Eating/Feeding: NPO Grooming: Moderate assistance, Sitting (with mod cues) Upper  Body Bathing: Minimal assitance, Sitting (with mod cues ) Lower Body Bathing: Moderate assistance (with +2 min A sit<>stand, mod cues ) Upper Body Dressing : Moderate assistance (with mod cues) Lower Body Dressing: Maximal assistance (with +2 min A sit<>stand; mod cues) Toilet Transfer: +2 for physical assistance, Ambulation, Minimal assistance (Bil HHA) Toilet Transfer Details (indicate cue type and reason): Bed> out door with eventual sitting in recliner behind him Toileting- Clothing Manipulation and Hygiene:  (mod cues)    Mobility  Overal bed mobility: Needs Assistance Bed Mobility: Supine to Sit Supine to sit: Mod assist, HOB elevated General bed mobility comments: A with bringing trunk up to sitting and scooting hips to EOB.  pt has difficulty following verbal directions, but does well with gestural cueing.      Transfers  Overall transfer level: Needs assistance Equipment used: Rolling walker (2 wheeled) Transfers: Sit to/from Stand, Stand Pivot Transfers Sit to Stand: Min assist, +2 physical  assistance, +2 safety/equipment Stand pivot transfers: Min assist, +2 physical assistance General transfer comment: pt impulsive and upon sight of RW kept reaching out for it and trying to standing despite multiple verbal and gestural cues.  2nd person present for safety and to A with management of lines.  pt indicating needing to have BM, so pivoted to 3-in-1 without success.  RN aware.      Ambulation / Gait / Stairs / Wheelchair Mobility  Ambulation/Gait Ambulation/Gait assistance: Min assist, +2 physical assistance, +2 safety/equipment Ambulation Distance (Feet): 40 Feet (and 30) Assistive device: Rolling walker (2 wheeled) Gait Pattern/deviations: Step-through pattern, Decreased stride length, Decreased weight shift to left, Drifts right/left General Gait Details: pt needs A for management of RW, positioning within RW.  pt easily distracted and begins to drift to R side with outside distractions.  pt with audible wheezing during ambulation and worse as he fatigues, but O2 sats remained in 90's on RA.      Posture / Balance Dynamic Sitting Balance Sitting balance - Comments: reaches to feet and anteriorly about 8" Balance Overall balance assessment: Needs assistance Sitting-balance support: No upper extremity supported, Feet supported Sitting balance-Leahy Scale: Good Sitting balance - Comments: reaches to feet and anteriorly about 8" Standing balance support: Bilateral upper extremity supported, During functional activity Standing balance-Leahy Scale: Poor Standing balance comment: grossly unstable, provided UE support for standing activities    Special needs/care consideration BiPAP/CPAP  no CPM   no Continuous Drip IV   no Dialysis   no  Life Vest   no Oxygen   Yes, 2L O2 via nasal cannula Special Bed  no Trach Size   no Wound Vac (area)   no Skin   Rn denies skin issues       Bowel mgmt:   Last BM 05/26/14 ; pt. Attempted to get OOB to signal his need for BM; assisted onto [facility]  and was continent of bowel Bladder mgmt:   Foley cath juste Dc'd today Diabetic mgmt no     Previous Home Environment Living Arrangements: Spouse/significant other  Lives With: Spouse Available Help at Discharge: Family, Available 24 hours/day Type of Home: House Home Layout: One level Home Access: Stairs to enter Entrance Stairs-Rails: None Entrance Stairs-Number of Steps: 1 Bathroom Shower/Tub: Public librarian, Architectural technologist: Standard Home Care Services: No  Discharge Living Setting Plans for Discharge Living Setting: Patient's home Type of Home at Discharge: House Discharge Home Layout: One level Discharge Home Access: Stairs to enter Entrance Stairs-Rails: None Entrance Stairs-Number of Steps:  1 (1 step then threshold) Discharge Bathroom Shower/Tub: Tub/shower unit Discharge Bathroom Toilet: Standard Discharge Bathroom Accessibility: Yes How Accessible: Accessible via walker Does the patient have any problems obtaining your medications?: No  Social/Family/Support Systems Patient Roles: Spouse, Parent Anticipated Caregiver: wife Sol Englert is retired and available 24/7 Ability/Limitations of Caregiver: Leonia Reader cannot physically lift due to back issues Caregiver Availability: 24/7 Discharge Plan Discussed with Primary Caregiver: Yes Is Caregiver In Agreement with Plan?: Yes Does Caregiver/Family have Issues with Lodging/Transportation while Pt is in Rehab?: No    Goals/Additional Needs Patient/Family Goal for Rehab: supervision for PT/OT; min and mod assist for SLP Expected length of stay: 10-14 days Cultural Considerations: none Equipment Needs: TBA Pt/Family Agrees to Admission and willing to participate: Yes Program Orientation Provided & Reviewed with Pt/Caregiver Including Roles  & Responsibilities: Yes   Decrease burden of Care through IP rehab admission: no   Possible need for SNF placement upon discharge:   Not  anticipated   Patient Condition: This patient's condition remains as documented in the consult dated 05/26/14 in which habilitation Physician determined and documented that the patient's condition is appropriate for intensive rehabilitative care in an inpatient rehabilitation facility. Will admit to inpatient rehab today.  Preadmission Screen Completed By:  Gerlean Ren, 05/26/2014 1:31 PM ______________________________________________________________________   Discussed status with Dr.  Su Hilt  At 1102 on 05/26/14  And received telephone approval for admission to rehab  Admission Coordinator:  Gerlean Ren, time Date 05/26/14 at 1338

## 2014-05-26 NOTE — Progress Notes (Signed)
Meredith Staggers, MD Physician Signed Physical Medicine and Rehabilitation Consult Note 05/26/2014 5:45 AM  Related encounter: ED to Hosp-Admission (Current) from 05/23/2014 in [facility]    Expand All Collapse All        Physical Medicine and Rehabilitation Consult Reason for Consult: Left MCA territory infarct Referring Physician: Dr.Xu    HPI: Henry [name] is a 80 y.o.right handed male with history of hypertension, hyperlipidemia. Independent prior to admission living with his wife. Presented 05/23/2014 to [facility] with syncopal episode with findings of aphasia as well as right-sided weakness. Patient did not receive TPA. Initial cranial CT scan showed no acute intracranial abnormality. CT angio of the head showed nonvisualization of the left trocar artery, occluded proximally within the neck. Patient was transferred to [facility] for ongoing care. CT cerebral perfusion scan showed areas of ischemic core infarct within the left MCA territory. Echocardiogram with ejection fraction of 28% grade 1 diastolic dysfunction. Venous Doppler study with no signs of DVT. Carotid arteriogram completed followed by revascularization of acutely occluded left ICA proximal with stent assisted angioplasty per interventional radiology 05/23/2014. Patient initially maintained on ventilator followed by critical care medicine for a short time. Follow-up MRI of the brain 05/24/2014 showing multiple small areas of acute ischemic left middle cerebral artery territory. Neurology follow-up currently maintained on aspirin and Plavix for CVA prophylaxis as well as subcutaneous Lovenox for DVT prophylaxis. Presently nothing by mouth await swallow study. Occupational therapy evaluation completed 05/25/2014 with recommendations of physical medicine rehabilitation consult.   Review of Systems  Unable to perform ROS: language   Past Medical History  Diagnosis Date    . Hypertension   . GERD (gastroesophageal reflux disease)   . Hypercholesteremia   . HOH (hard of hearing)    Past Surgical History  Procedure Laterality Date  . Shoulder surgery Right     torn cartiledge  . Esophagogastroduodenoscopy  10/28/2010    Procedure: ESOPHAGOGASTRODUODENOSCOPY (EGD); Surgeon: [name], MD; Location: [facility]; Service: Endoscopy; Laterality: N/A; 10:30  . Colonoscopy    . Colonoscopy  05/17/2011    Procedure: COLONOSCOPY; Surgeon: [name], MD; Location: [facility]; Service: Endoscopy; Laterality: N/A; 830  . Resection distal clavical Right 12/13/2012    Procedure: RESECTION DISTAL CLAVICAL; Surgeon: Carole Civil, MD; Location: [facility] ORS; Service: Orthopedics; Laterality: Right;  . Shoulder arthroscopy with bicepstenotomy Right 12/13/2012    Procedure: SHOULDER ARTHROSCOPY WITH BICEPS TENOTOMY, limited shoulder debridement; Surgeon: Carole Civil, MD; Location: [facility] ORS; Service: Orthopedics; Laterality: Right;  . Esophagogastroduodenoscopy N/A 04/24/2014    Procedure: ESOPHAGOGASTRODUODENOSCOPY (EGD); Surgeon: [name], MD; Location: [facility]; Service: Endoscopy; Laterality: N/A; 46   Family History  Problem Relation Age of Onset  . Colon cancer Neg Hx    Social History:  reports that he quit smoking about 45 years ago. His smoking use included Cigarettes. He has a 17.5 pack-year smoking history. He does not have any smokeless tobacco history on file. He reports that he drinks about 1.8 oz of alcohol per week. He reports that he does not use illicit drugs. Allergies: No Known Allergies Medications Prior to Admission  Medication Sig Dispense Refill  . alfuzosin (UROXATRAL) 10 MG 24 hr tablet Take 10 mg by mouth daily with breakfast.    . amLODipine (NORVASC) 5 MG tablet Take 5 mg by mouth daily.    Marland Kitchen atorvastatin  (LIPITOR) 40 MG tablet Take  40 mg by mouth every evening.     Marland Kitchen esomeprazole (NEXIUM) 40 MG capsule Take 40 mg by mouth daily.    . hydrochlorothiazide (HYDRODIURIL) 25 MG tablet Take 25 mg by mouth daily.    . meclizine (ANTIVERT) 25 MG tablet Take 25 mg by mouth 3 (three) times daily as needed for dizziness.    Marland Kitchen HYDROcodone-acetaminophen (NORCO) 7.5-325 MG per tablet Take 1 tablet by mouth every 4 (four) hours as needed for pain. (Patient not taking: Reported on 04/13/2014) 60 tablet 0  . methocarbamol (ROBAXIN) 500 MG tablet Take 1 tablet (500 mg total) by mouth 3 (three) times daily. (Patient not taking: Reported on 12/29/2013) 60 tablet 1  . promethazine (PHENERGAN) 12.5 MG tablet Take 1 tablet (12.5 mg total) by mouth every 6 (six) hours as needed for nausea. (Patient not taking: Reported on 12/29/2013) 30 tablet 0    Home: [place] expects to be discharged to:: Private residence Living Arrangements: Spouse/significant other Available Help at Discharge: Family, Available 24 hours/day Type of Home: House Home Access: Stairs to enter [employer] of Steps: 1 Entrance Stairs-Rails: None Home Layout: One level Home Equipment: None Lives With: Spouse  Functional History: Prior Function Level of Independence: Independent Comments: loves to golf. Retired Scientific laboratory technician Status:  Mobility: Bed Mobility Overal bed mobility: Needs Assistance Bed Mobility: Supine to Sit Supine to sit: Mod assist, +2 for safety/equipment General bed mobility comments: +another to assist with lines Transfers Overall transfer level: Needs assistance Equipment used: 2 person hand held assist Transfers: Sit to/from Stand Sit to Stand: Min assist, +2 safety/equipment General transfer comment: assist for safety/balance; pt initially impulsive requiring verbal, tactile, gesturing cues for safety Ambulation/Gait Ambulation/Gait assistance:  Min assist, +2 physical assistance Ambulation Distance (Feet): 35 Feet Assistive device: 2 person hand held assist Gait Pattern/deviations: Step-through pattern, Shuffle, Decreased stride length, Wide base of support General Gait Details: self limited, but unsure if pt dizzy/lightheaded or just confused due to communication deficits    ADL: ADL Overall ADL's : Needs assistance/impaired Eating/Feeding: NPO Grooming: Moderate assistance, Sitting (with mod cues) Upper Body Bathing: Minimal assitance, Sitting (with mod cues ) Lower Body Bathing: Moderate assistance (with +2 min A sit<>stand, mod cues ) Upper Body Dressing : Moderate assistance (with mod cues) Lower Body Dressing: Maximal assistance (with +2 min A sit<>stand; mod cues) Toilet Transfer: +2 for physical assistance, Ambulation, Minimal assistance (Bil HHA) Toilet Transfer Details (indicate cue type and reason): Bed> out door with eventual sitting in recliner behind him Toileting- Clothing Manipulation and Hygiene: (mod cues)  Cognition: Cognition Overall Cognitive Status: Impaired/Different from baseline Arousal/Alertness: Awake/alert Orientation Level: Oriented to person, Oriented to place (assessed by pt nodding "yes" or "no") Attention: Sustained Sustained Attention: Appears intact Memory: (will assess in diagnostic treatment) Awareness: Impaired Awareness Impairment: Intellectual impairment, Emergent impairment, Anticipatory impairment (did not exhibit frustration) Problem Solving: (will asssess further) Safety/Judgment: Impaired Comments: Research scientist (life sciences) states pt attempting to get OOB) Cognition Arousal/Alertness: Awake/alert Behavior During Therapy: Impulsive Overall Cognitive Status: Impaired/Different from baseline Area of Impairment: Orientation, Attention, Following commands, Safety/judgement, Problem solving Orientation Level: (intially nodded yes to being born in Jan, but then with time chose Sept. Nodded yes  to 2014) Current Attention Level: Sustained Following Commands: Follows one step commands inconsistently, Follows one step commands with increased time Safety/Judgement: Decreased awareness of safety, Decreased awareness of deficits Problem Solving: Requires verbal cues, Requires tactile cues Difficult to assess due to: Impaired communication, Hard of hearing/deaf  Blood  pressure 112/56, pulse 46, temperature 98.3 F (36.8 C), temperature source Oral, resp. rate 27, height 5\' 10"  (1.778 m), weight 101.7 kg (224 lb 3.3 oz), SpO2 96 %. Physical Exam  HENT:  Head: Normocephalic.  Eyes: EOM are normal.  Neck: Normal range of motion. Neck supple. No thyromegaly present.  Cardiovascular: Normal rate and regular rhythm.  Respiratory: Effort normal and breath sounds normal. No respiratory distress.  GI: Soft. Bowel sounds are normal. He exhibits no distension.  Musculoskeletal: He exhibits edema (RLE).  Neurological:  Patient is alert and very impulsive. Aphasic but was able to state his name. Very inconsistent to follow commands---sometimes was close to completing given task. He displays minimal withdrawal to painful stimuli. Strength grossly 3 to 4-/5 but inconsistent due to apraxia, language issues. Perseverates on given tasks at times.  Skin: Skin is warm and dry.  Psychiatric:  Flat, non-distressed     Lab Results Last 24 Hours    Results for orders placed or performed during the hospital encounter of 05/23/14 (from the past 24 hour(s))  Basic metabolic panel Status: Abnormal   Collection Time: 05/26/14 2:40 AM  Result Value Ref Range   Sodium 143 135 - 145 mmol/L   Potassium 3.8 3.5 - 5.1 mmol/L   Chloride 114 (H) 96 - 112 mmol/L   CO2 23 19 - 32 mmol/L   Glucose, Bld 105 (H) 70 - 99 mg/dL   BUN 20 6 - 23 mg/dL   Creatinine, Ser 0.93 0.50 - 1.35 mg/dL   Calcium 8.4 8.4 - 10.5 mg/dL   GFR calc non Af Amer 79 (L) >90 mL/min    GFR calc Af Amer >90 >90 mL/min   Anion gap 6 5 - 15  CBC with Differential/Platelet Status: Abnormal   Collection Time: 05/26/14 2:40 AM  Result Value Ref Range   WBC 9.5 4.0 - 10.5 K/uL   RBC 4.09 (L) 4.22 - 5.81 MIL/uL   Hemoglobin 11.2 (L) 13.0 - 17.0 g/dL   HCT 35.2 (L) 39.0 - 52.0 %   MCV 86.1 78.0 - 100.0 fL   MCH 27.4 26.0 - 34.0 pg   MCHC 31.8 30.0 - 36.0 g/dL   RDW 13.8 11.5 - 15.5 %   Platelets 187 150 - 400 K/uL   Neutrophils Relative % 70 43 - 77 %   Neutro Abs 6.6 1.7 - 7.7 K/uL   Lymphocytes Relative 19 12 - 46 %   Lymphs Abs 1.8 0.7 - 4.0 K/uL   Monocytes Relative 11 3 - 12 %   Monocytes Absolute 1.1 (H) 0.1 - 1.0 K/uL   Eosinophils Relative 0 0 - 5 %   Eosinophils Absolute 0.0 0.0 - 0.7 K/uL   Basophils Relative 0 0 - 1 %   Basophils Absolute 0.0 0.0 - 0.1 K/uL  Platelet inhibition p2y12 Status: Abnormal   Collection Time: 05/26/14 2:40 AM  Result Value Ref Range   Platelet Function P2Y12 113 (L) 194 - 418 PRU      Imaging Results (Last 48 hours)    Mr Brain Wo Contrast  05/24/2014 CLINICAL DATA: Syncopal episode, aphasic and RIGHT-sided weakness. Known acute LEFT middle cerebral artery territory infarct and LEFT ICA occlusion. History of hypertension and hyperlipidemia. EXAM: MRI HEAD WITHOUT CONTRAST MRA HEAD WITHOUT CONTRAST TECHNIQUE: Multiplanar, multiecho pulse sequences of the brain and surrounding structures were obtained without intravenous contrast. Angiographic images of the head were obtained using MRA technique without contrast. COMPARISON: CT and CTA of the head May 23, 2014 FINDINGS: MRI HEAD FINDINGS Motion degraded examination, severely motion degraded coronal T2. Patchy areas of reduced diffusion in LEFT frontal, temporal and parietal lobes, including posterior aspect of the LEFT putaminal and, subcentimeter focus of reduced  diffusion LEFT corona radiata. Low ADC values. Largest area proximally 15 mm. No susceptibility artifact to suggest lobar hematoma. Tiny tubular focus of susceptibility artifact in LEFT frontal lobe suggests flow void. Moderate ventriculomegaly, likely on the basis of global parenchymal brain volume loss, with disproportionate sulcal effacement at the convexities. Confluent supratentorial white matter T2 hyperintensities in addition to T2 hyperintense signal associated with the acute ischemia. No midline shift, mass effect or mass lesions. No abnormal extra-axial fluid collections. Ocular globes and orbital contents are unremarkable. Mild pan paranasal sinus mucosal thickening without air-fluid levels. Trace mastoid effusions. No abnormal sellar expansion. No cerebellar tonsillar ectopia. No suspicious bone marrow signal. MRA HEAD FINDINGS Anterior circulation: Normal flow related enhancement of the included cervical, petrous, cavernous and supra clinoid internal carotid arteries bilaterally. Normal flow related enhancement anterior middle cerebral arteries. Posterior circulation: RIGHT vertebral artery is dominant. Normal flow early enhancement of the vertebral arteries, vertebrobasilar junction, basilar artery and posterior cerebral arteries. Robust RIGHT, small LEFT posterior communicating arteries are present. Normal flow early enhancement posterior cerebral arteries. No large vessel occlusion, high-grade stenosis, aneurysm, suspicious luminal irregularity. IMPRESSION: MRI HEAD: Multi small areas of acute ischemia LEFT middle cerebral artery territory. Small vascular flow void, less likely micro hemorrhage LEFT posterior frontal lobe without lobar hematoma. Involutional changes. Moderate to severe white matter changes suggest chronic small vessel ischemic disease. Findings suggests of normal pressure hydrocephalus. MRA HEAD: Recannulized LEFT internal carotid artery. Normal MRA of the head, complete  circle of Willis. Electronically Signed By: Elon Alas On: 05/24/2014 18:45   Dg Chest Port 1 View  05/25/2014 CLINICAL DATA: 80 year old male with a history of respiratory failure. EXAM: PORTABLE CHEST - 1 VIEW COMPARISON: 05/23/2014, 12/29/2013 FINDINGS: Left rotation of the patient. Widened superior mediastinum, similar to the comparison, accentuated by the rotation of the patient. Heart size unchanged. Calcifications of the aortic arch. Low lung volumes accentuating the interstitium. Mixed interstitial and airspace disease of the bilateral infrahilar regions, particularly on the left. No pneumothorax. Endotracheal tube terminates approximately 2.9 cm above the carina. Unchanged gastric tube, terminating in the upper abdomen. IMPRESSION: Endotracheal tube similar to comparison, terminating approximately 2.9 cm above the carina. Unchanged gastric tube. Low lung volumes, with airspace and interstitial opacities potentially reflecting atelectasis and/ or consolidation. Widened mediastinum, accentuated by the rotation of the patient. If there were concern for acute mediastinal or vascular abnormality, CT would be advised. Atherosclerosis Signed, Dulcy Fanny. Earleen Newport, DO [facility] Radiology Electronically Signed By: Corrie Mckusick D.O. On: 05/25/2014 08:24   Mr Jodene Nam Head/brain Wo Cm  05/24/2014 CLINICAL DATA: Syncopal episode, aphasic and RIGHT-sided weakness. Known acute LEFT middle cerebral artery territory infarct and LEFT ICA occlusion. History of hypertension and hyperlipidemia. EXAM: MRI HEAD WITHOUT CONTRAST MRA HEAD WITHOUT CONTRAST TECHNIQUE: Multiplanar, multiecho pulse sequences of the brain and surrounding structures were obtained without intravenous contrast. Angiographic images of the head were obtained using MRA technique without contrast. COMPARISON: CT and CTA of the head May 23, 2014 FINDINGS: MRI  HEAD FINDINGS Motion degraded examination, severely motion degraded coronal T2. Patchy areas of reduced diffusion in LEFT frontal, temporal and parietal lobes, including posterior aspect of the LEFT putaminal and, subcentimeter focus of reduced diffusion LEFT corona radiata. Low ADC values.  Largest area proximally 15 mm. No susceptibility artifact to suggest lobar hematoma. Tiny tubular focus of susceptibility artifact in LEFT frontal lobe suggests flow void. Moderate ventriculomegaly, likely on the basis of global parenchymal brain volume loss, with disproportionate sulcal effacement at the convexities. Confluent supratentorial white matter T2 hyperintensities in addition to T2 hyperintense signal associated with the acute ischemia. No midline shift, mass effect or mass lesions. No abnormal extra-axial fluid collections. Ocular globes and orbital contents are unremarkable. Mild pan paranasal sinus mucosal thickening without air-fluid levels. Trace mastoid effusions. No abnormal sellar expansion. No cerebellar tonsillar ectopia. No suspicious bone marrow signal. MRA HEAD FINDINGS Anterior circulation: Normal flow related enhancement of the included cervical, petrous, cavernous and supra clinoid internal carotid arteries bilaterally. Normal flow related enhancement anterior middle cerebral arteries. Posterior circulation: RIGHT vertebral artery is dominant. Normal flow early enhancement of the vertebral arteries, vertebrobasilar junction, basilar artery and posterior cerebral arteries. Robust RIGHT, small LEFT posterior communicating arteries are present. Normal flow early enhancement posterior cerebral arteries. No large vessel occlusion, high-grade stenosis, aneurysm, suspicious luminal irregularity. IMPRESSION: MRI HEAD: Multi small areas of acute ischemia LEFT middle cerebral artery territory. Small vascular flow void, less likely micro hemorrhage LEFT posterior frontal lobe without lobar hematoma.  Involutional changes. Moderate to severe white matter changes suggest chronic small vessel ischemic disease. Findings suggests of normal pressure hydrocephalus. MRA HEAD: Recannulized LEFT internal carotid artery. Normal MRA of the head, complete circle of Willis. Electronically Signed By: Elon Alas On: 05/24/2014 18:45     Assessment/Plan: Diagnosis: left MCA infarct 1. Does the need for close, 24 hr/day medical supervision in concert with the patient's rehab needs make it unreasonable for this patient to be served in a less intensive setting? Yes 2. Co-Morbidities requiring supervision/potential complications: respiratory failure, htn 3. Due to bladder management, bowel management, safety, skin/wound care, disease management, medication administration, pain management and patient education, does the patient require 24 hr/day rehab nursing? Yes 4. Does the patient require coordinated care of a physician, rehab nurse, PT (1-2 hrs/day, 5 days/week), OT (1-2 hrs/day, 5 days/week) and SLP (1-2 hrs/day, 5 days/week) to address physical and functional deficits in the context of the above medical diagnosis(es)? Yes Addressing deficits in the following areas: balance, endurance, locomotion, strength, transferring, bowel/bladder control, bathing, dressing, feeding, grooming, toileting, cognition, speech, language, swallowing and psychosocial support 5. Can the patient actively participate in an intensive therapy program of at least 3 hrs of therapy per day at least 5 days per week? Yes 6. The potential for patient to make measurable gains while on inpatient rehab is excellent 7. Anticipated functional outcomes upon discharge from inpatient rehab are supervision with PT, supervision with OT, supervision, min assist and mod assist with SLP. 8. Estimated rehab length of stay to reach the above functional goals is: 10-14 days 9. Does the patient have adequate social supports and living  environment to accommodate these discharge functional goals? Yes 10. Anticipated D/C setting: Home 11. Anticipated post D/C treatments: HH therapy and Outpatient therapy 12. Overall Rehab/Functional Prognosis: excellent  RECOMMENDATIONS: This patient's condition is appropriate for continued rehabilitative care in the following setting: CIR Patient has agreed to participate in recommended program. Yes Note that insurance prior authorization may be required for reimbursement for recommended care.  Comment: Rehab Admissions Coordinator to follow up.  Thanks,  Meredith Staggers, MD, Mellody Drown     05/26/2014

## 2014-05-26 NOTE — Progress Notes (Signed)
Speech Language Pathology Treatment: Cognitive-Linquistic  Patient Details Name: Henry [name] MRN: [id number] DOB: 07/18/1934 Today's Date: 05/26/2014 Time: 0810-0825 SLP Time Calculation (min) (ACUTE ONLY): 15 min  Assessment / Plan / Recommendation Clinical Impression  Goal of session included facilitation of basic expression and receptive language abilities. Decreased respiratory support resulted in part word phonation with phonemic distortions (will need to target breath support). Pt stated his name independently and intelligibly x 1 and part of wife's name. Verbal and visual cues provided for automatic speech with minimal approximations during familiar song and one approximation with numbers. Observed pt following one step commands during RN assessment with 70% accuracy. ST to focus on breath support in addition to language intervention for basic needs/thoughts.   HPI HPI: 80 y.o. male hx of HTN, HLD, GERD, HOH admitted with syncopal episode, aphasia and right sided weakness. Per MD report findings pertinent for nonvisualization of the left ICA, occluded proximally within the neck. MRI multi small areas of acute ischemia LEFT middle cerebral artery territory. Small vascular flow void, less likely micro hemorrhage LEFT posterior frontal lobe without lobar hematoma. Intubated 4/9-4/11 am. CXR low lung volumes, with airspace and interstitial opacities potentially reflecting atelectasis and/ or consolidation.   Pertinent Vitals Pain Assessment: No/denies pain  SLP Plan  Continue with current plan of care    Recommendations Diet recommendations: NPO              General recommendations: Rehab consult Oral Care Recommendations: Oral care BID Follow up Recommendations: Inpatient Rehab Plan: Continue with current plan of care    GO     Houston Siren 05/26/2014, 8:41 AM   Orbie Pyo Colvin Caroli.Ed [employer] [phone]

## 2014-05-26 NOTE — Progress Notes (Signed)
PCCM Interval Note  Extubated successfully. Some UA noise but overall stable.  Swallow eval planned for today I will sign off. Please call if we can help in any way  Baltazar Apo, MD, PhD 05/26/2014, 11:16 AM [facility] [phone] or if no answer [phone]

## 2014-05-26 NOTE — Interval H&P Note (Signed)
Henry [name] was admitted today to Inpatient Rehabilitation with the diagnosis of left MCA infarct.  The patient's history has been reviewed, patient examined, and there is no change in status.  Patient continues to be appropriate for intensive inpatient rehabilitation.  I have reviewed the patient's chart and labs.  Questions were answered to the patient's satisfaction.  Alandra Sando T 05/26/2014, 8:14 PM

## 2014-05-26 NOTE — H&P (View-Only) (Signed)
Physical Medicine and Rehabilitation Admission H&P    Henry Complaint  Patient presents with  . Loss of Consciousness  : HPI: Henry [name] is a 80 y.o.right handed male with history of hypertension, hyperlipidemia. Independent prior to admission living with his wife. Presented 05/23/2014 to [facility] with syncopal episode with findings of aphasia as well as right-sided weakness. Patient did not receive TPA. Initial cranial CT scan showed no acute intracranial abnormality. CT angio of the head showed nonvisualization of the left trocar artery, occluded proximally within the neck. Patient was transferred to [facility] for ongoing care. CT cerebral perfusion scan showed areas of ischemic core infarct within the left MCA territory. Echocardiogram with ejection fraction of 65% grade 1 diastolic dysfunction. Venous Doppler study with no signs of DVT. Carotid arteriogram completed followed by revascularization of acutely occluded left ICA proximal with stent assisted angioplasty per interventional radiology 05/23/2014. Patient initially maintained on ventilator followed by critical care medicine for a short time. Follow-up MRI of the brain 05/24/2014 showing multiple small areas of acute ischemic left middle cerebral artery territory. Neurology follow-up currently maintained on aspirin and Plavix for CVA prophylaxis as well as subcutaneous Lovenox for DVT prophylaxis. Presently nothing by mouth await swallow study completed 05/26/2014 placed on a dysphagia 1 thin liquid diet. Occupational therapy evaluation completed 05/25/2014 with recommendations of physical medicine rehabilitation consult. Patient was admitted for comprehensive rehabilitation program  Review of Systems  Psychiatric/Behavioral: Positive for depression.   Review of Systems  Unable to perform ROS: language    Past Medical History  Diagnosis Date  . Hypertension   . GERD (gastroesophageal reflux disease)   .  Hypercholesteremia   . HOH (hard of hearing)    Past Surgical History  Procedure Laterality Date  . Shoulder surgery Right     torn cartiledge  . Esophagogastroduodenoscopy  10/28/2010    Procedure: ESOPHAGOGASTRODUODENOSCOPY (EGD);  Surgeon: [name], MD;  Location: [facility];  Service: Endoscopy;  Laterality: N/A;  10:30  . Colonoscopy    . Colonoscopy  05/17/2011    Procedure: COLONOSCOPY;  Surgeon: [name], MD;  Location: [facility];  Service: Endoscopy;  Laterality: N/A;  830  . Resection distal clavical Right 12/13/2012    Procedure: RESECTION DISTAL CLAVICAL;  Surgeon: Carole Civil, MD;  Location: [facility];  Service: Orthopedics;  Laterality: Right;  . Shoulder arthroscopy with bicepstenotomy Right 12/13/2012    Procedure: SHOULDER ARTHROSCOPY WITH BICEPS TENOTOMY, limited shoulder debridement;  Surgeon: Carole Civil, MD;  Location: [facility];  Service: Orthopedics;  Laterality: Right;  . Esophagogastroduodenoscopy N/A 04/24/2014    Procedure: ESOPHAGOGASTRODUODENOSCOPY (EGD);  Surgeon: [name], MD;  Location: [facility];  Service: Endoscopy;  Laterality: N/A;  32   Family History  Problem Relation Age of Onset  . Colon cancer Neg Hx    Social History:  reports that he quit smoking about 45 years ago. His smoking use included Cigarettes. He has a 17.5 pack-year smoking history. He does not have any smokeless tobacco history on file. He reports that he drinks about 1.8 oz of alcohol per week. He reports that he does not use illicit drugs. Allergies: No Known Allergies Medications Prior to Admission  Medication Sig Dispense Refill  . alfuzosin (UROXATRAL) 10 MG 24 hr tablet Take 10 mg by mouth daily with breakfast.    . amLODipine (NORVASC) 5 MG tablet Take 5 mg by mouth daily.    Marland Kitchen  atorvastatin (LIPITOR) 40 MG tablet Take 40 mg by mouth every evening.     Marland Kitchen esomeprazole (NEXIUM) 40 MG capsule Take 40 mg by mouth daily.    . hydrochlorothiazide  (HYDRODIURIL) 25 MG tablet Take 25 mg by mouth daily.    . meclizine (ANTIVERT) 25 MG tablet Take 25 mg by mouth 3 (three) times daily as needed for dizziness.    Marland Kitchen HYDROcodone-acetaminophen (NORCO) 7.5-325 MG per tablet Take 1 tablet by mouth every 4 (four) hours as needed for pain. (Patient not taking: Reported on 04/13/2014) 60 tablet 0  . methocarbamol (ROBAXIN) 500 MG tablet Take 1 tablet (500 mg total) by mouth 3 (three) times daily. (Patient not taking: Reported on 12/29/2013) 60 tablet 1  . promethazine (PHENERGAN) 12.5 MG tablet Take 1 tablet (12.5 mg total) by mouth every 6 (six) hours as needed for nausea. (Patient not taking: Reported on 12/29/2013) 30 tablet 0    Home: [place] expects to be discharged to:: Private residence Living Arrangements: Spouse/significant other Available Help at Discharge: Family, Available 24 hours/day Type of Home: House Home Access: Stairs to enter [employer] of Steps: 1 Entrance Stairs-Rails: None Home Layout: One level Home Equipment: None  Lives With: Spouse   Functional History: Prior Function Level of Independence: Independent Comments: loves to golf. Retired Health and safety inspector Status:  Mobility: Bed Mobility Overal bed mobility: Needs Assistance Bed Mobility: Supine to Sit Supine to sit: Mod assist, HOB elevated General bed mobility comments: A with bringing trunk up to sitting and scooting hips to EOB.  pt has difficulty following verbal directions, but does well with gestural cueing.   Transfers Overall transfer level: Needs assistance Equipment used: Rolling walker (2 wheeled) Transfers: Sit to/from Stand, Stand Pivot Transfers Sit to Stand: Min assist, +2 physical assistance, +2 safety/equipment Stand pivot transfers: Min assist, +2 physical assistance General transfer comment: pt impulsive and upon sight of RW kept reaching out for it and trying to standing despite multiple verbal and gestural  cues.  2nd person present for safety and to A with management of lines.  pt indicating needing to have BM, so pivoted to 3-in-1 without success.  RN aware.   Ambulation/Gait Ambulation/Gait assistance: Min assist, +2 physical assistance, +2 safety/equipment Ambulation Distance (Feet): 40 Feet (and 30) Assistive device: Rolling walker (2 wheeled) Gait Pattern/deviations: Step-through pattern, Decreased stride length, Decreased weight shift to left, Drifts right/left General Gait Details: pt needs A for management of RW, positioning within RW.  pt easily distracted and begins to drift to R side with outside distractions.  pt with audible wheezing during ambulation and worse as he fatigues, but O2 sats remained in 90's on RA.      ADL: ADL Overall ADL's : Needs assistance/impaired Eating/Feeding: NPO Grooming: Moderate assistance, Sitting (with mod cues) Upper Body Bathing: Minimal assitance, Sitting (with mod cues ) Lower Body Bathing: Moderate assistance (with +2 min A sit<>stand, mod cues ) Upper Body Dressing : Moderate assistance (with mod cues) Lower Body Dressing: Maximal assistance (with +2 min A sit<>stand; mod cues) Toilet Transfer: +2 for physical assistance, Ambulation, Minimal assistance (Bil HHA) Toilet Transfer Details (indicate cue type and reason): Bed> out door with eventual sitting in recliner behind him Toileting- Clothing Manipulation and Hygiene:  (mod cues)  Cognition: Cognition Overall Cognitive Status: Impaired/Different from baseline Arousal/Alertness: Awake/alert Orientation Level: Oriented to person, Other (comment), Oriented to situation (nods yes/no) Attention: Sustained Sustained Attention: Appears intact Memory:  (will assess in diagnostic treatment) Awareness:  Impaired Awareness Impairment: Intellectual impairment, Emergent impairment, Anticipatory impairment (did not exhibit frustration) Problem Solving:  (will asssess further) Safety/Judgment:  Impaired Comments:  Research scientist (life sciences) states pt attempting to get OOB) Cognition Arousal/Alertness: Awake/alert Behavior During Therapy: Impulsive Overall Cognitive Status: Impaired/Different from baseline Area of Impairment: Orientation, Attention, Following commands, Safety/judgement, Awareness, Problem solving Orientation Level: Disoriented to, Time, Situation (pt having trouble picking correct answer out of list of 3.) Current Attention Level: Focused Following Commands: Follows one step commands inconsistently Safety/Judgement: Decreased awareness of safety, Decreased awareness of deficits Awareness: Intellectual Problem Solving: Slow processing, Difficulty sequencing, Decreased initiation, Requires verbal cues, Requires tactile cues Difficult to assess due to: Impaired communication, Hard of hearing/deaf  Physical Exam: Blood pressure 130/58, pulse 46, temperature 98 F (36.7 C), temperature source Oral, resp. rate 21, height _0  (1.778 m), weight 101.7 kg (224 lb 3.3 oz), SpO2 100 %. Physical Exam HENT: oral mucosa pink/moist Head: Normocephalic. atraumatic Eyes: EOM are normal.  Neck: Normal range of motion. Neck supple. No thyromegaly present.  Cardiovascular: Normal rate and regular rhythm. no murmurs. No gallops Respiratory: Effort normal and breath sounds normal. No respiratory distress.  GI: Soft. Bowel sounds are normal. He exhibits no distension.  Musculoskeletal: He exhibits edema (RLE).  Neurological: right central 7, decreased oral-motor control Patient is alert and very impulsive. Aphasic but was able to state his name. Very inconsistent to follow commands---sometimes was close to completing given task. He displays minimal withdrawal to painful stimuli. Strength grossly 3 to 4-/5 but inconsistent due to apraxia, language issues. Perseverates on given tasks at times.  Skin: Skin is warm and dry.  Psychiatric:  Flat, non-distressed  Results for orders placed or performed  during the hospital encounter of 05/23/14 (from the past 48 hour(s))  BMET in AM     Status: Abnormal   Collection Time: 05/25/14  3:50 AM  Result Value Ref Range   Sodium 139 135 - 145 mmol/L   Potassium 3.8 3.5 - 5.1 mmol/L   Chloride 113 (H) 96 - 112 mmol/L   CO2 23 19 - 32 mmol/L   Glucose, Bld 105 (H) 70 - 99 mg/dL   BUN 17 6 - 23 mg/dL   Creatinine, Ser 0.88 0.50 - 1.35 mg/dL   Calcium 8.2 (L) 8.4 - 10.5 mg/dL   GFR calc non Af Amer 81 (L) >90 mL/min   GFR calc Af Amer >90 >90 mL/min    Comment: (NOTE) The eGFR has been calculated using the CKD EPI equation. This calculation has not been validated in all clinical situations. eGFR's persistently <90 mL/min signify possible Chronic Kidney Disease.    Anion gap 3 (L) 5 - 15  Platelet inhibition p2y12     Status: Abnormal   Collection Time: 05/25/14  3:50 AM  Result Value Ref Range   Platelet Function  P2Y12 144 (L) 194 - 418 PRU    Comment:        The literature has shown a direct correlation of PRU values over 230 with higher risks of thrombotic events.  Lower PRU values are associated with platelet inhibition.   Basic metabolic panel     Status: Abnormal   Collection Time: 05/26/14  2:40 AM  Result Value Ref Range   Sodium 143 135 - 145 mmol/L   Potassium 3.8 3.5 - 5.1 mmol/L   Chloride 114 (H) 96 - 112 mmol/L   CO2 23 19 - 32 mmol/L   Glucose, Bld 105 (H) 70 - 99 mg/dL  BUN 20 6 - 23 mg/dL   Creatinine, Ser 0.93 0.50 - 1.35 mg/dL   Calcium 8.4 8.4 - 10.5 mg/dL   GFR calc non Af Amer 79 (L) >90 mL/min   GFR calc Af Amer >90 >90 mL/min    Comment: (NOTE) The eGFR has been calculated using the CKD EPI equation. This calculation has not been validated in all clinical situations. eGFR's persistently <90 mL/min signify possible Chronic Kidney Disease.    Anion gap 6 5 - 15  CBC with Differential/Platelet     Status: Abnormal   Collection Time: 05/26/14  2:40 AM  Result Value Ref Range   WBC 9.5 4.0 - 10.5 K/uL    RBC 4.09 (L) 4.22 - 5.81 MIL/uL   Hemoglobin 11.2 (L) 13.0 - 17.0 g/dL   HCT 35.2 (L) 39.0 - 52.0 %   MCV 86.1 78.0 - 100.0 fL   MCH 27.4 26.0 - 34.0 pg   MCHC 31.8 30.0 - 36.0 g/dL   RDW 13.8 11.5 - 15.5 %   Platelets 187 150 - 400 K/uL   Neutrophils Relative % 70 43 - 77 %   Neutro Abs 6.6 1.7 - 7.7 K/uL   Lymphocytes Relative 19 12 - 46 %   Lymphs Abs 1.8 0.7 - 4.0 K/uL   Monocytes Relative 11 3 - 12 %   Monocytes Absolute 1.1 (H) 0.1 - 1.0 K/uL   Eosinophils Relative 0 0 - 5 %   Eosinophils Absolute 0.0 0.0 - 0.7 K/uL   Basophils Relative 0 0 - 1 %   Basophils Absolute 0.0 0.0 - 0.1 K/uL  Platelet inhibition p2y12     Status: Abnormal   Collection Time: 05/26/14  2:40 AM  Result Value Ref Range   Platelet Function  P2Y12 113 (L) 194 - 418 PRU    Comment:        The literature has shown a direct correlation of PRU values over 230 with higher risks of thrombotic events.  Lower PRU values are associated with platelet inhibition.    Mr Brain Wo Contrast  05/24/2014   CLINICAL DATA:  Syncopal episode, aphasic and RIGHT-sided weakness. Known acute LEFT middle cerebral artery territory infarct and LEFT ICA occlusion. History of hypertension and hyperlipidemia.  EXAM: MRI HEAD WITHOUT CONTRAST  MRA HEAD WITHOUT CONTRAST  TECHNIQUE: Multiplanar, multiecho pulse sequences of the brain and surrounding structures were obtained without intravenous contrast. Angiographic images of the head were obtained using MRA technique without contrast.  COMPARISON:  CT and CTA of the head May 23, 2014  FINDINGS: MRI HEAD FINDINGS  Motion degraded examination, severely motion degraded coronal T2. Patchy areas of reduced diffusion in LEFT frontal, temporal and parietal lobes, including posterior aspect of the LEFT putaminal and, subcentimeter focus of reduced diffusion LEFT corona radiata. Low ADC values. Largest area proximally 15 mm. No susceptibility artifact to suggest lobar hematoma. Tiny tubular  focus of susceptibility artifact in LEFT frontal lobe suggests flow void.  Moderate ventriculomegaly, likely on the basis of global parenchymal brain volume loss, with disproportionate sulcal effacement at the convexities. Confluent supratentorial white matter T2 hyperintensities in addition to T2 hyperintense signal associated with the acute ischemia. No midline shift, mass effect or mass lesions.  No abnormal extra-axial fluid collections. Ocular globes and orbital contents are unremarkable. Mild pan paranasal sinus mucosal thickening without air-fluid levels. Trace mastoid effusions. No abnormal sellar expansion. No cerebellar tonsillar ectopia. No suspicious bone marrow signal.  MRA HEAD FINDINGS  Anterior circulation:  Normal flow related enhancement of the included cervical, petrous, cavernous and supra clinoid internal carotid arteries bilaterally. Normal flow related enhancement anterior middle cerebral arteries.  Posterior circulation: RIGHT vertebral artery is dominant. Normal flow early enhancement of the vertebral arteries, vertebrobasilar junction, basilar artery and posterior cerebral arteries. Robust RIGHT, small LEFT posterior communicating arteries are present. Normal flow early enhancement posterior cerebral arteries.  No large vessel occlusion, high-grade stenosis, aneurysm, suspicious luminal irregularity.  IMPRESSION: MRI HEAD: Multi small areas of acute ischemia LEFT middle cerebral artery territory. Small vascular flow void, less likely micro hemorrhage LEFT posterior frontal lobe without lobar hematoma.  Involutional changes. Moderate to severe white matter changes suggest chronic small vessel ischemic disease.  Findings suggests of normal pressure hydrocephalus.  MRA HEAD: Recannulized LEFT internal carotid artery. Normal MRA of the head, complete circle of Willis.   Electronically Signed   By: Elon Alas   On: 05/24/2014 18:45   Dg Chest Port 1 View  05/26/2014   CLINICAL DATA:   Cerebral vascular accident  EXAM: PORTABLE CHEST - 1 VIEW  COMPARISON:  May 25, 2014  FINDINGS: Endotracheal tube and nasogastric tube have been removed. No pneumothorax. No edema or consolidation. Heart is mildly enlarged with pulmonary vascularity within normal limits. There is atherosclerotic change in the aortic arch. No adenopathy.  IMPRESSION: No edema or consolidation. No pneumothorax. Stable cardiac prominence.   Electronically Signed   By: Lowella Grip III M.D.   On: 05/26/2014 08:14   Dg Chest Port 1 View  05/25/2014   CLINICAL DATA:  80 year old male with a history of respiratory failure.  EXAM: PORTABLE CHEST - 1 VIEW  COMPARISON:  05/23/2014, 12/29/2013  FINDINGS: Left rotation of the patient.  Widened superior mediastinum, similar to the comparison, accentuated by the rotation of the patient.  Heart size unchanged.  Calcifications of the aortic arch.  Low lung volumes accentuating the interstitium.  Mixed interstitial and airspace disease of the bilateral infrahilar regions, particularly on the left.  No pneumothorax.  Endotracheal tube terminates approximately 2.9 cm above the carina.  Unchanged gastric tube, terminating in the upper abdomen.  IMPRESSION: Endotracheal tube similar to comparison, terminating approximately 2.9 cm above the carina. Unchanged gastric tube.  Low lung volumes, with airspace and interstitial opacities potentially reflecting atelectasis and/ or consolidation.  Widened mediastinum, accentuated by the rotation of the patient. If there were concern for acute mediastinal or vascular abnormality, CT would be advised.  Atherosclerosis  Signed,  Dulcy Fanny. Earleen Newport, DO  [facility]  [facility]   Electronically Signed   By: Corrie Mckusick D.O.   On: 05/25/2014 08:24   Mr Jodene Nam Head/brain Wo Cm  05/24/2014   CLINICAL DATA:  Syncopal episode, aphasic and RIGHT-sided weakness. Known acute LEFT middle cerebral artery territory infarct  and LEFT ICA occlusion. History of hypertension and hyperlipidemia.  EXAM: MRI HEAD WITHOUT CONTRAST  MRA HEAD WITHOUT CONTRAST  TECHNIQUE: Multiplanar, multiecho pulse sequences of the brain and surrounding structures were obtained without intravenous contrast. Angiographic images of the head were obtained using MRA technique without contrast.  COMPARISON:  CT and CTA of the head May 23, 2014  FINDINGS: MRI HEAD FINDINGS  Motion degraded examination, severely motion degraded coronal T2. Patchy areas of reduced diffusion in LEFT frontal, temporal and parietal lobes, including posterior aspect of the LEFT putaminal and, subcentimeter focus of reduced diffusion LEFT corona radiata. Low ADC values. Largest area proximally 15 mm. No susceptibility artifact to suggest  lobar hematoma. Tiny tubular focus of susceptibility artifact in LEFT frontal lobe suggests flow void.  Moderate ventriculomegaly, likely on the basis of global parenchymal brain volume loss, with disproportionate sulcal effacement at the convexities. Confluent supratentorial white matter T2 hyperintensities in addition to T2 hyperintense signal associated with the acute ischemia. No midline shift, mass effect or mass lesions.  No abnormal extra-axial fluid collections. Ocular globes and orbital contents are unremarkable. Mild pan paranasal sinus mucosal thickening without air-fluid levels. Trace mastoid effusions. No abnormal sellar expansion. No cerebellar tonsillar ectopia. No suspicious bone marrow signal.  MRA HEAD FINDINGS  Anterior circulation: Normal flow related enhancement of the included cervical, petrous, cavernous and supra clinoid internal carotid arteries bilaterally. Normal flow related enhancement anterior middle cerebral arteries.  Posterior circulation: RIGHT vertebral artery is dominant. Normal flow early enhancement of the vertebral arteries, vertebrobasilar junction, basilar artery and posterior cerebral arteries. Robust RIGHT, small  LEFT posterior communicating arteries are present. Normal flow early enhancement posterior cerebral arteries.  No large vessel occlusion, high-grade stenosis, aneurysm, suspicious luminal irregularity.  IMPRESSION: MRI HEAD: Multi small areas of acute ischemia LEFT middle cerebral artery territory. Small vascular flow void, less likely micro hemorrhage LEFT posterior frontal lobe without lobar hematoma.  Involutional changes. Moderate to severe white matter changes suggest chronic small vessel ischemic disease.  Findings suggests of normal pressure hydrocephalus.  MRA HEAD: Recannulized LEFT internal carotid artery. Normal MRA of the head, complete circle of Willis.   Electronically Signed   By: Elon Alas   On: 05/24/2014 18:45       Medical Problem List and Plan: 1. Functional deficits secondary to left MCA territory infarct with ICA occlusion status post mechanical thrombectomy with left ICA stenting, embolic secondary to large vessel atherosclerosis 2.  DVT Prophylaxis/Anticoagulation: Subcutaneous Lovenox. Monitor platelet counts and any signs of bleeding. Venous Doppler studies 05/25/2014 negative 3. Pain Management: Tylenol as needed 4. Dysphagia. Dysphagia 1 thin liquid diet. Follow-up speech therapy 5. Neuropsych: This patient is capable of making decisions on his own behalf. 6. Skin/Wound Care: Routine skin checks 7. Fluids/Electrolytes/Nutrition: Strict I&O's with follow-up chemistries 8. Hyperlipidemia. Lipitor 9. Hypertension. Allowing for permissive hypertension. No present antihypertensive medication. Patient on Norvasc 5 mg daily, hydrochlorothiazide 25 mg daily prior to admission. Resume as tolerated 10. GERD. Protonix   Post Admission Physician Evaluation: 1. Functional deficits secondary  to left MCA infarct. 2. Patient is admitted to receive collaborative, interdisciplinary care between the physiatrist, rehab nursing staff, and therapy team. 3. Patient's level of  medical complexity and substantial therapy needs in context of that medical necessity cannot be provided at a lesser intensity of care such as a SNF. 4. Patient has experienced substantial functional loss from his/her baseline which was documented above under the "Functional History" and "Functional Status" headings.  Judging by the patient's diagnosis, physical exam, and functional history, the patient has potential for functional progress which will result in measurable gains while on inpatient rehab.  These gains will be of substantial and practical use upon discharge  in facilitating mobility and self-care at the household level. 5. Physiatrist will provide 24 hour management of medical needs as well as oversight of the therapy plan/treatment and provide guidance as appropriate regarding the interaction of the two. 6. 24 hour rehab nursing will assist with bladder management, bowel management, safety, skin/wound care, disease management, medication administration, pain management and patient education  and help integrate therapy concepts, techniques,education, etc. 7. PT will assess and treat for/with: Lower  extremity strength, range of motion, stamina, balance, functional mobility, safety, adaptive techniques and equipment, NMR, cognitive linguistic integration, pain control, ego support, family ed.   Goals are: supervision to min assist. 8. OT will assess and treat for/with: ADL's, functional mobility, safety, upper extremity strength, adaptive techniques and equipment, NMR, cognitive linguistic integration, ego support, family education.   Goals are: supervision to min assist. Therapy may proceed with showering this patient. 9. SLP will assess and treat for/with: language, cognition, swallowing, communications.  Goals are: min to max assist. 10. Case Management and Social Worker will assess and treat for psychological issues and discharge planning. 11. Team conference will be held weekly to assess  progress toward goals and to determine barriers to discharge. 12. Patient will receive at least 3 hours of therapy per day at least 5 days per week. 13. ELOS: 11-16 days       14. Prognosis:  excellent     Meredith Staggers, MD, [facility] 05/26/2014   05/26/2014

## 2014-05-26 NOTE — Progress Notes (Signed)
Inpatient Rehabilitation  I met with the patient, his wife and daughter [name] at the bedside to discuss his post acute rehab options .  Wife and daughter very strongly want Henry [name]. to come to IP rehab. [facility] [name]. Is agreeable.  I have discussed Henry [name]'s medical readiness with Dr. Erlinda Hong and he is in agreement that Henry [name]. is ready for IP Rehab today,  and I have a bed available for  him.  I have notified Barbette Or, SW and [place], [place] of these [email or web address]  I will arrange for admission today.  Please call if questions.  [place] Admissions Coordinator Cell [phone] Office [phone]

## 2014-05-26 NOTE — Progress Notes (Signed)
Meredith Staggers, MD Physician Signed Physical Medicine and Rehabilitation H&P 05/26/2014 12:01 PM  Related encounter: ED to Hosp-Admission (Current) from 05/23/2014 in [facility]    Expand All Collapse All      Physical Medicine and Rehabilitation Admission H&P   Chief Complaint  Patient presents with  . Loss of Consciousness  : HPI: Henry [name] is a 80 y.o.right handed male with history of hypertension, hyperlipidemia. Independent prior to admission living with his wife. Presented 05/23/2014 to [facility] with syncopal episode with findings of aphasia as well as right-sided weakness. Patient did not receive TPA. Initial cranial CT scan showed no acute intracranial abnormality. CT angio of the head showed nonvisualization of the left trocar artery, occluded proximally within the neck. Patient was transferred to [facility] for ongoing care. CT cerebral perfusion scan showed areas of ischemic core infarct within the left MCA territory. Echocardiogram with ejection fraction of 93% grade 1 diastolic dysfunction. Venous Doppler study with no signs of DVT. Carotid arteriogram completed followed by revascularization of acutely occluded left ICA proximal with stent assisted angioplasty per interventional radiology 05/23/2014. Patient initially maintained on ventilator followed by critical care medicine for a short time. Follow-up MRI of the brain 05/24/2014 showing multiple small areas of acute ischemic left middle cerebral artery territory. Neurology follow-up currently maintained on aspirin and Plavix for CVA prophylaxis as well as subcutaneous Lovenox for DVT prophylaxis. Presently nothing by mouth await swallow study completed 05/26/2014 placed on a dysphagia 1 thin liquid diet. Occupational therapy evaluation completed 05/25/2014 with recommendations of physical medicine rehabilitation consult. Patient was admitted for comprehensive  rehabilitation program  Review of Systems  Psychiatric/Behavioral: Positive for depression.   Review of Systems  Unable to perform ROS: language    Past Medical History  Diagnosis Date  . Hypertension   . GERD (gastroesophageal reflux disease)   . Hypercholesteremia   . HOH (hard of hearing)    Past Surgical History  Procedure Laterality Date  . Shoulder surgery Right     torn cartiledge  . Esophagogastroduodenoscopy  10/28/2010    Procedure: ESOPHAGOGASTRODUODENOSCOPY (EGD); Surgeon: [name], MD; Location: [facility]; Service: Endoscopy; Laterality: N/A; 10:30  . Colonoscopy    . Colonoscopy  05/17/2011    Procedure: COLONOSCOPY; Surgeon: [name], MD; Location: [facility]; Service: Endoscopy; Laterality: N/A; 830  . Resection distal clavical Right 12/13/2012    Procedure: RESECTION DISTAL CLAVICAL; Surgeon: Carole Civil, MD; Location: [facility] ORS; Service: Orthopedics; Laterality: Right;  . Shoulder arthroscopy with bicepstenotomy Right 12/13/2012    Procedure: SHOULDER ARTHROSCOPY WITH BICEPS TENOTOMY, limited shoulder debridement; Surgeon: Carole Civil, MD; Location: [facility]S; Service: Orthopedics; Laterality: Right;  . Esophagogastroduodenoscopy N/A 04/24/2014    Procedure: ESOPHAGOGASTRODUODENOSCOPY (EGD); Surgeon: [name], MD; Location: [facility]; Service: Endoscopy; Laterality: N/A; 60   Family History  Problem Relation Age of Onset  . Colon cancer Neg Hx    Social History:  reports that he quit smoking about 45 years ago. His smoking use included Cigarettes. He has a 17.5 pack-year smoking history. He does not have any smokeless tobacco history on file. He reports that he drinks about 1.8 oz of alcohol per week. He reports that he does not use illicit drugs. Allergies: No Known Allergies Medications Prior to Admission  Medication Sig  Dispense Refill  . alfuzosin (UROXATRAL) 10 MG 24 hr tablet Take 10 mg by mouth daily with  breakfast.    . amLODipine (NORVASC) 5 MG tablet Take 5 mg by mouth daily.    Marland Kitchen atorvastatin (LIPITOR) 40 MG tablet Take 40 mg by mouth every evening.     Marland Kitchen esomeprazole (NEXIUM) 40 MG capsule Take 40 mg by mouth daily.    . hydrochlorothiazide (HYDRODIURIL) 25 MG tablet Take 25 mg by mouth daily.    . meclizine (ANTIVERT) 25 MG tablet Take 25 mg by mouth 3 (three) times daily as needed for dizziness.    Marland Kitchen HYDROcodone-acetaminophen (NORCO) 7.5-325 MG per tablet Take 1 tablet by mouth every 4 (four) hours as needed for pain. (Patient not taking: Reported on 04/13/2014) 60 tablet 0  . methocarbamol (ROBAXIN) 500 MG tablet Take 1 tablet (500 mg total) by mouth 3 (three) times daily. (Patient not taking: Reported on 12/29/2013) 60 tablet 1  . promethazine (PHENERGAN) 12.5 MG tablet Take 1 tablet (12.5 mg total) by mouth every 6 (six) hours as needed for nausea. (Patient not taking: Reported on 12/29/2013) 30 tablet 0    Home: [place] expects to be discharged to:: Private residence Living Arrangements: Spouse/significant other Available Help at Discharge: Family, Available 24 hours/day Type of Home: House Home Access: Stairs to enter [employer] of Steps: 1 Entrance Stairs-Rails: None Home Layout: One level Home Equipment: None Lives With: Spouse  Functional History: Prior Function Level of Independence: Independent Comments: loves to golf. Retired Health and safety inspector Status:  Mobility: Bed Mobility Overal bed mobility: Needs Assistance Bed Mobility: Supine to Sit Supine to sit: Mod assist, HOB elevated General bed mobility comments: A with bringing trunk up to sitting and scooting hips to EOB. pt has difficulty following verbal directions, but does well with gestural cueing.  Transfers Overall transfer level:  Needs assistance Equipment used: Rolling walker (2 wheeled) Transfers: Sit to/from Stand, Stand Pivot Transfers Sit to Stand: Min assist, +2 physical assistance, +2 safety/equipment Stand pivot transfers: Min assist, +2 physical assistance General transfer comment: pt impulsive and upon sight of RW kept reaching out for it and trying to standing despite multiple verbal and gestural cues. 2nd person present for safety and to A with management of lines. pt indicating needing to have BM, so pivoted to 3-in-1 without success. RN aware.  Ambulation/Gait Ambulation/Gait assistance: Min assist, +2 physical assistance, +2 safety/equipment Ambulation Distance (Feet): 40 Feet (and 30) Assistive device: Rolling walker (2 wheeled) Gait Pattern/deviations: Step-through pattern, Decreased stride length, Decreased weight shift to left, Drifts right/left General Gait Details: pt needs A for management of RW, positioning within RW. pt easily distracted and begins to drift to R side with outside distractions. pt with audible wheezing during ambulation and worse as he fatigues, but O2 sats remained in 90's on RA.     ADL: ADL Overall ADL's : Needs assistance/impaired Eating/Feeding: NPO Grooming: Moderate assistance, Sitting (with mod cues) Upper Body Bathing: Minimal assitance, Sitting (with mod cues ) Lower Body Bathing: Moderate assistance (with +2 min A sit<>stand, mod cues ) Upper Body Dressing : Moderate assistance (with mod cues) Lower Body Dressing: Maximal assistance (with +2 min A sit<>stand; mod cues) Toilet Transfer: +2 for physical assistance, Ambulation, Minimal assistance (Bil HHA) Toilet Transfer Details (indicate cue type and reason): Bed> out door with eventual sitting in recliner behind him Toileting- Clothing Manipulation and Hygiene: (mod cues)  Cognition: Cognition Overall Cognitive Status: Impaired/Different from baseline Arousal/Alertness: Awake/alert Orientation Level:  Oriented to person, Other (comment), Oriented to situation (nods yes/no) Attention: Sustained Sustained Attention: Appears intact  Memory: (will assess in diagnostic treatment) Awareness: Impaired Awareness Impairment: Intellectual impairment, Emergent impairment, Anticipatory impairment (did not exhibit frustration) Problem Solving: (will asssess further) Safety/Judgment: Impaired Comments: Research scientist (life sciences) states pt attempting to get OOB) Cognition Arousal/Alertness: Awake/alert Behavior During Therapy: Impulsive Overall Cognitive Status: Impaired/Different from baseline Area of Impairment: Orientation, Attention, Following commands, Safety/judgement, Awareness, Problem solving Orientation Level: Disoriented to, Time, Situation (pt having trouble picking correct answer out of list of 3.) Current Attention Level: Focused Following Commands: Follows one step commands inconsistently Safety/Judgement: Decreased awareness of safety, Decreased awareness of deficits Awareness: Intellectual Problem Solving: Slow processing, Difficulty sequencing, Decreased initiation, Requires verbal cues, Requires tactile cues Difficult to assess due to: Impaired communication, Hard of hearing/deaf  Physical Exam: Blood pressure 130/58, pulse 46, temperature 98 F (36.7 C), temperature source Oral, resp. rate 21, height 5' 10"  (1.778 m), weight 101.7 kg (224 lb 3.3 oz), SpO2 100 %. Physical Exam HENT: oral mucosa pink/moist Head: Normocephalic. atraumatic Eyes: EOM are normal.  Neck: Normal range of motion. Neck supple. No thyromegaly present.  Cardiovascular: Normal rate and regular rhythm. no murmurs. No gallops Respiratory: Effort normal and breath sounds normal. No respiratory distress.  GI: Soft. Bowel sounds are normal. He exhibits no distension.  Musculoskeletal: He exhibits edema (RLE).  Neurological: right central 7, decreased oral-motor control Patient is alert and very impulsive. Aphasic but  was able to state his name. Very inconsistent to follow commands---sometimes was close to completing given task. He displays minimal withdrawal to painful stimuli. Strength grossly 3 to 4-/5 but inconsistent due to apraxia, language issues. Perseverates on given tasks at times.  Skin: Skin is warm and dry.  Psychiatric:  Flat, non-distressed   Lab Results Last 48 Hours    Results for orders placed or performed during the hospital encounter of 05/23/14 (from the past 48 hour(s))  BMET in AM Status: Abnormal   Collection Time: 05/25/14 3:50 AM  Result Value Ref Range   Sodium 139 135 - 145 mmol/L   Potassium 3.8 3.5 - 5.1 mmol/L   Chloride 113 (H) 96 - 112 mmol/L   CO2 23 19 - 32 mmol/L   Glucose, Bld 105 (H) 70 - 99 mg/dL   BUN 17 6 - 23 mg/dL   Creatinine, Ser 0.88 0.50 - 1.35 mg/dL   Calcium 8.2 (L) 8.4 - 10.5 mg/dL   GFR calc non Af Amer 81 (L) >90 mL/min   GFR calc Af Amer >90 >90 mL/min    Comment: (NOTE) The eGFR has been calculated using the CKD EPI equation. This calculation has not been validated in all clinical situations. eGFR's persistently <90 mL/min signify possible Chronic Kidney Disease.    Anion gap 3 (L) 5 - 15  Platelet inhibition p2y12 Status: Abnormal   Collection Time: 05/25/14 3:50 AM  Result Value Ref Range   Platelet Function P2Y12 144 (L) 194 - 418 PRU    Comment:   The literature has shown a direct correlation of PRU values over 230 with higher risks of thrombotic events. Lower PRU values are associated with platelet inhibition.   Basic metabolic panel Status: Abnormal   Collection Time: 05/26/14 2:40 AM  Result Value Ref Range   Sodium 143 135 - 145 mmol/L   Potassium 3.8 3.5 - 5.1 mmol/L   Chloride 114 (H) 96 - 112 mmol/L   CO2 23 19 - 32 mmol/L   Glucose, Bld 105 (H) 70 - 99 mg/dL   BUN 20 6 - 23 mg/dL  Creatinine,  Ser 0.93 0.50 - 1.35 mg/dL   Calcium 8.4 8.4 - 10.5 mg/dL   GFR calc non Af Amer 79 (L) >90 mL/min   GFR calc Af Amer >90 >90 mL/min    Comment: (NOTE) The eGFR has been calculated using the CKD EPI equation. This calculation has not been validated in all clinical situations. eGFR's persistently <90 mL/min signify possible Chronic Kidney Disease.    Anion gap 6 5 - 15  CBC with Differential/Platelet Status: Abnormal   Collection Time: 05/26/14 2:40 AM  Result Value Ref Range   WBC 9.5 4.0 - 10.5 K/uL   RBC 4.09 (L) 4.22 - 5.81 MIL/uL   Hemoglobin 11.2 (L) 13.0 - 17.0 g/dL   HCT 35.2 (L) 39.0 - 52.0 %   MCV 86.1 78.0 - 100.0 fL   MCH 27.4 26.0 - 34.0 pg   MCHC 31.8 30.0 - 36.0 g/dL   RDW 13.8 11.5 - 15.5 %   Platelets 187 150 - 400 K/uL   Neutrophils Relative % 70 43 - 77 %   Neutro Abs 6.6 1.7 - 7.7 K/uL   Lymphocytes Relative 19 12 - 46 %   Lymphs Abs 1.8 0.7 - 4.0 K/uL   Monocytes Relative 11 3 - 12 %   Monocytes Absolute 1.1 (H) 0.1 - 1.0 K/uL   Eosinophils Relative 0 0 - 5 %   Eosinophils Absolute 0.0 0.0 - 0.7 K/uL   Basophils Relative 0 0 - 1 %   Basophils Absolute 0.0 0.0 - 0.1 K/uL  Platelet inhibition p2y12 Status: Abnormal   Collection Time: 05/26/14 2:40 AM  Result Value Ref Range   Platelet Function P2Y12 113 (L) 194 - 418 PRU    Comment:   The literature has shown a direct correlation of PRU values over 230 with higher risks of thrombotic events. Lower PRU values are associated with platelet inhibition.       Imaging Results (Last 48 hours)    Mr Brain Wo Contrast  05/24/2014 CLINICAL DATA: Syncopal episode, aphasic and RIGHT-sided weakness. Known acute LEFT middle cerebral artery territory infarct and LEFT ICA occlusion. History of hypertension and hyperlipidemia. EXAM: MRI HEAD WITHOUT CONTRAST MRA HEAD WITHOUT  CONTRAST TECHNIQUE: Multiplanar, multiecho pulse sequences of the brain and surrounding structures were obtained without intravenous contrast. Angiographic images of the head were obtained using MRA technique without contrast. COMPARISON: CT and CTA of the head May 23, 2014 FINDINGS: MRI HEAD FINDINGS Motion degraded examination, severely motion degraded coronal T2. Patchy areas of reduced diffusion in LEFT frontal, temporal and parietal lobes, including posterior aspect of the LEFT putaminal and, subcentimeter focus of reduced diffusion LEFT corona radiata. Low ADC values. Largest area proximally 15 mm. No susceptibility artifact to suggest lobar hematoma. Tiny tubular focus of susceptibility artifact in LEFT frontal lobe suggests flow void. Moderate ventriculomegaly, likely on the basis of global parenchymal brain volume loss, with disproportionate sulcal effacement at the convexities. Confluent supratentorial white matter T2 hyperintensities in addition to T2 hyperintense signal associated with the acute ischemia. No midline shift, mass effect or mass lesions. No abnormal extra-axial fluid collections. Ocular globes and orbital contents are unremarkable. Mild pan paranasal sinus mucosal thickening without air-fluid levels. Trace mastoid effusions. No abnormal sellar expansion. No cerebellar tonsillar ectopia. No suspicious bone marrow signal. MRA HEAD FINDINGS Anterior circulation: Normal flow related enhancement of the included cervical, petrous, cavernous and supra clinoid internal carotid arteries bilaterally. Normal flow related enhancement anterior middle cerebral arteries. Posterior circulation: RIGHT  vertebral artery is dominant. Normal flow early enhancement of the vertebral arteries, vertebrobasilar junction, basilar artery and posterior cerebral arteries. Robust RIGHT, small LEFT posterior communicating arteries are present. Normal flow early enhancement posterior cerebral arteries. No large  vessel occlusion, high-grade stenosis, aneurysm, suspicious luminal irregularity. IMPRESSION: MRI HEAD: Multi small areas of acute ischemia LEFT middle cerebral artery territory. Small vascular flow void, less likely micro hemorrhage LEFT posterior frontal lobe without lobar hematoma. Involutional changes. Moderate to severe white matter changes suggest chronic small vessel ischemic disease. Findings suggests of normal pressure hydrocephalus. MRA HEAD: Recannulized LEFT internal carotid artery. Normal MRA of the head, complete circle of Willis. Electronically Signed By: Elon Alas On: 05/24/2014 18:45   Dg Chest Port 1 View  05/26/2014 CLINICAL DATA: Cerebral vascular accident EXAM: PORTABLE CHEST - 1 VIEW COMPARISON: May 25, 2014 FINDINGS: Endotracheal tube and nasogastric tube have been removed. No pneumothorax. No edema or consolidation. Heart is mildly enlarged with pulmonary vascularity within normal limits. There is atherosclerotic change in the aortic arch. No adenopathy. IMPRESSION: No edema or consolidation. No pneumothorax. Stable cardiac prominence. Electronically Signed By: Lowella Grip III M.D. On: 05/26/2014 08:14   Dg Chest Port 1 View  05/25/2014 CLINICAL DATA: 80 year old male with a history of respiratory failure. EXAM: PORTABLE CHEST - 1 VIEW COMPARISON: 05/23/2014, 12/29/2013 FINDINGS: Left rotation of the patient. Widened superior mediastinum, similar to the comparison, accentuated by the rotation of the patient. Heart size unchanged. Calcifications of the aortic arch. Low lung volumes accentuating the interstitium. Mixed interstitial and airspace disease of the bilateral infrahilar regions, particularly on the left. No pneumothorax. Endotracheal tube terminates approximately 2.9 cm above the carina. Unchanged gastric tube, terminating in the upper abdomen. IMPRESSION: Endotracheal tube similar to comparison, terminating approximately  2.9 cm above the carina. Unchanged gastric tube. Low lung volumes, with airspace and interstitial opacities potentially reflecting atelectasis and/ or consolidation. Widened mediastinum, accentuated by the rotation of the patient. If there were concern for acute mediastinal or vascular abnormality, CT would be advised. Atherosclerosis Signed, Dulcy Fanny. Earleen Newport, DO [facility] Electronically Signed By: Corrie Mckusick D.O. On: 05/25/2014 08:24   Mr Jodene Nam Head/brain Wo Cm  05/24/2014 CLINICAL DATA: Syncopal episode, aphasic and RIGHT-sided weakness. Known acute LEFT middle cerebral artery territory infarct and LEFT ICA occlusion. History of hypertension and hyperlipidemia. EXAM: MRI HEAD WITHOUT CONTRAST MRA HEAD WITHOUT CONTRAST TECHNIQUE: Multiplanar, multiecho pulse sequences of the brain and surrounding structures were obtained without intravenous contrast. Angiographic images of the head were obtained using MRA technique without contrast. COMPARISON: CT and CTA of the head May 23, 2014 FINDINGS: MRI HEAD FINDINGS Motion degraded examination, severely motion degraded coronal T2. Patchy areas of reduced diffusion in LEFT frontal, temporal and parietal lobes, including posterior aspect of the LEFT putaminal and, subcentimeter focus of reduced diffusion LEFT corona radiata. Low ADC values. Largest area proximally 15 mm. No susceptibility artifact to suggest lobar hematoma. Tiny tubular focus of susceptibility artifact in LEFT frontal lobe suggests flow void. Moderate ventriculomegaly, likely on the basis of global parenchymal brain volume loss, with disproportionate sulcal effacement at the convexities. Confluent supratentorial white matter T2 hyperintensities in addition to T2 hyperintense signal associated with the acute ischemia. No midline shift, mass effect or mass lesions. No abnormal extra-axial fluid collections. Ocular globes  and orbital contents are unremarkable. Mild pan paranasal sinus mucosal thickening without air-fluid levels. Trace mastoid effusions. No abnormal sellar expansion. No cerebellar tonsillar ectopia. No suspicious bone marrow  signal. MRA HEAD FINDINGS Anterior circulation: Normal flow related enhancement of the included cervical, petrous, cavernous and supra clinoid internal carotid arteries bilaterally. Normal flow related enhancement anterior middle cerebral arteries. Posterior circulation: RIGHT vertebral artery is dominant. Normal flow early enhancement of the vertebral arteries, vertebrobasilar junction, basilar artery and posterior cerebral arteries. Robust RIGHT, small LEFT posterior communicating arteries are present. Normal flow early enhancement posterior cerebral arteries. No large vessel occlusion, high-grade stenosis, aneurysm, suspicious luminal irregularity. IMPRESSION: MRI HEAD: Multi small areas of acute ischemia LEFT middle cerebral artery territory. Small vascular flow void, less likely micro hemorrhage LEFT posterior frontal lobe without lobar hematoma. Involutional changes. Moderate to severe white matter changes suggest chronic small vessel ischemic disease. Findings suggests of normal pressure hydrocephalus. MRA HEAD: Recannulized LEFT internal carotid artery. Normal MRA of the head, complete circle of Willis. Electronically Signed By: Elon Alas On: 05/24/2014 18:45        Medical Problem List and Plan: 1. Functional deficits secondary to left MCA territory infarct with ICA occlusion status post mechanical thrombectomy with left ICA stenting, embolic secondary to large vessel atherosclerosis 2. DVT Prophylaxis/Anticoagulation: Subcutaneous Lovenox. Monitor platelet counts and any signs of bleeding. Venous Doppler studies 05/25/2014 negative 3. Pain Management: Tylenol as needed 4. Dysphagia. Dysphagia 1 thin liquid diet. Follow-up speech therapy 5. Neuropsych:  This patient is capable of making decisions on his own behalf. 6. Skin/Wound Care: Routine skin checks 7. Fluids/Electrolytes/Nutrition: Strict I&O's with follow-up chemistries 8. Hyperlipidemia. Lipitor 9. Hypertension. Allowing for permissive hypertension. No present antihypertensive medication. Patient on Norvasc 5 mg daily, hydrochlorothiazide 25 mg daily prior to admission. Resume as tolerated 10. GERD. Protonix   Post Admission Physician Evaluation: 1. Functional deficits secondary to left MCA infarct. 2. Patient is admitted to receive collaborative, interdisciplinary care between the physiatrist, rehab nursing staff, and therapy team. 3. Patient's level of medical complexity and substantial therapy needs in context of that medical necessity cannot be provided at a lesser intensity of care such as a SNF. 4. Patient has experienced substantial functional loss from his/her baseline which was documented above under the "Functional History" and "Functional Status" headings. Judging by the patient's diagnosis, physical exam, and functional history, the patient has potential for functional progress which will result in measurable gains while on inpatient rehab. These gains will be of substantial and practical use upon discharge in facilitating mobility and self-care at the household level. 5. Physiatrist will provide 24 hour management of medical needs as well as oversight of the therapy plan/treatment and provide guidance as appropriate regarding the interaction of the two. 6. 24 hour rehab nursing will assist with bladder management, bowel management, safety, skin/wound care, disease management, medication administration, pain management and patient education and help integrate therapy concepts, techniques,education, etc. 7. PT will assess and treat for/with: Lower extremity strength, range of motion, stamina, balance, functional mobility, safety, adaptive techniques and equipment, NMR,  cognitive linguistic integration, pain control, ego support, family ed. Goals are: supervision to min assist. 8. OT will assess and treat for/with: ADL's, functional mobility, safety, upper extremity strength, adaptive techniques and equipment, NMR, cognitive linguistic integration, ego support, family education. Goals are: supervision to min assist. Therapy may proceed with showering this patient. 9. SLP will assess and treat for/with: language, cognition, swallowing, communications. Goals are: min to max assist. 10. Case Management and Social Worker will assess and treat for psychological issues and discharge planning. 11. Team conference will be held weekly to assess progress toward goals and to determine  barriers to discharge. 12. Patient will receive at least 3 hours of therapy per day at least 5 days per week. 13. ELOS: 11-16 days  14. Prognosis: excellent     Meredith Staggers, MD, [place] [facility] 05/26/2014  05/26/2014

## 2014-05-26 NOTE — Progress Notes (Signed)
Gerlean Ren Rehab Admission Coordinator Signed Physical Medicine and Rehabilitation PMR Pre-admission 05/26/2014 1:31 PM  Related encounter: ED to Hosp-Admission (Current) from 05/23/2014 in [place] [facility]    Expand All Collapse All   PMR Admission Coordinator Pre-Admission Assessment  Patient: Henry [name] is an 80 y.o., male MRN: [id number] DOB: [birth year] Height: 5\' 10"  (177.8 cm) Weight: 101.7 kg (224 lb 3.3 oz)  Insurance Information HMO: PPO: PCP: IPA: 80/20: OTHER:  PRIMARY: Medicare A and B Policy#: [id number] Subscriber: self CM Name: Phone#: Fax#:  Pre-Cert#: Employer: retired Benefits: Phone #: Name:  Eff. Date: 10/14/01 Deduct: $1288 Out of Pocket Max: Life Max: no CIR: 100% SNF: 100% first 20 days Outpatient: 80%/20% Co-Pay:  Home Health: 100% Co-Pay:  DME: 80%/20% Co-Pay:  Providers: Pt. choice SECONDARY: Tricare for Life Policy#: [id number] Subscriber: self CM Name: Phone#: Fax#:  Pre-Cert#: Employer:  Benefits: Phone #: Name:  Eff. Date: Deduct: Out of Pocket Max: Life Max:  CIR: SNF:  Outpatient: Co-Pay:  Home Health: Co-Pay:  DME: Co-Pay:   Medicaid Application Date: Case Manager:  Disability Application Date: Case Worker:   Emergency Contact Information Contact Information    Name Relation Home Work Mobile   [place] Spouse [phone]     nt Medical History  Patient Admitting Diagnosis: left MCA infarct History of Present Illness: Henry [name] is a 80 y.o.right handed male with history of hypertension, hyperlipidemia.  Independent prior to admission living with his wife. Presented 05/23/2014 to [facility] with syncopal episode with findings of aphasia as well as right-sided weakness. Patient did not receive TPA. Initial cranial CT scan showed no acute intracranial abnormality. CT angio of the head showed nonvisualization of the left trocar artery, occluded proximally within the neck. Patient was transferred to [facility] for ongoing care. CT cerebral perfusion scan showed areas of ischemic core infarct within the left MCA territory. Echocardiogram with ejection fraction of 00% grade 1 diastolic dysfunction. Venous Doppler study with no signs of DVT. Carotid arteriogram completed followed by revascularization of acutely occluded left ICA proximal with stent assisted angioplasty per interventional radiology 05/23/2014. Patient initially maintained on ventilator followed by critical care medicine for a short time. Follow-up MRI of the brain 05/24/2014 showing multiple small areas of acute ischemic left middle cerebral artery territory. Neurology follow-up currently maintained on aspirin and Plavix for CVA prophylaxis as well as subcutaneous Lovenox for DVT prophylaxis. Presently nothing by mouth await swallow study completed 05/26/2014 placed on a dysphagia 1 thin liquid diet. Occupational therapy evaluation completed 05/25/2014 with recommendations of physical medicine rehabilitation consult. Patient was admitted for comprehensive rehabilitation program Total: 9 [facility]    Past Medical History  Past Medical History  Diagnosis Date  . Hypertension   . GERD (gastroesophageal reflux disease)   . Hypercholesteremia   . HOH (hard of hearing)     Family History  family history is negative for Colon cancer.  Prior Rehab/Hospitalizations: OPPT for shoulder injury  Current Medications   Current facility-administered medications:  . stroke: mapping our early stages of recovery book, ,  Does not apply, Once, Drema Dallas, DO . acetaminophen (TYLENOL) tablet 1,000 mg, 1,000 mg, Oral, Q6H PRN **OR** acetaminophen (TYLENOL) suppository 650 mg, 650 mg, Rectal, Q6H PRN, Luanne Bras, MD . albuterol (PROVENTIL) (2.5 MG/3ML) 0.083% nebulizer solution 2.5 mg, 2.5 mg, Nebulization, Q2H PRN, Melvenia Needles, NP, 2.5 mg at 05/25/14 2131 . antiseptic oral rinse (CPC / CETYLPYRIDINIUM  CHLORIDE 0.05%) solution 7 mL, 7 mL, Mouth Rinse, QID, Tammy S Parrett, NP, 7 mL at 05/26/14 1304 . aspirin EC tablet 81 mg, 81 mg, Oral, Daily, Rosalin Hawking, MD, 81 mg at 05/26/14 1302 . atorvastatin (LIPITOR) tablet 40 mg, 40 mg, Oral, QPM, Rosalin Hawking, MD, 40 mg at 05/25/14 1711 . chlorhexidine (PERIDEX) 0.12 % solution 15 mL, 15 mL, Mouth Rinse, BID, Tammy S Parrett, NP, 15 mL at 05/25/14 2052 . clopidogrel (PLAVIX) tablet 75 mg, 75 mg, Oral, Q breakfast, Luanne Bras, MD, 75 mg at 05/26/14 1300 . enoxaparin (LOVENOX) injection 40 mg, 40 mg, Subcutaneous, Q24H, Rosalin Hawking, MD, 40 mg at 05/25/14 1727 . ondansetron (ZOFRAN) injection 4 mg, 4 mg, Intravenous, Q6H PRN, Luanne Bras, MD . pantoprazole (PROTONIX) injection 40 mg, 40 mg, Intravenous, Daily, Tammy S Parrett, NP, 40 mg at 05/26/14 1030  Patients Current Diet: Diet NPO time specified  Precautions / Restrictions Precautions Precautions: Fall Precaution Comments: h/o dizziness in standing per family (?vestibular versus orthostatic issues) Restrictions Weight Bearing Restrictions: No   Prior Activity Level Community (5-7x/wk): Wife reports pt. goes to breakfast with his buddies most days at the [employer] in [place] and will occasionally stay through lunch. Pt. has been walking 3 miles per day for the last 6 weeks in order to help a buddy recover from his back surgery [place] / Equipment Home Equipment: None  Prior Functional Level Prior Function Level of Independence: Independent Comments:  loves to golf, retired Network engineer  Current Functional Level Cognition  Arousal/Alertness: Awake/alert Overall Cognitive Status: Impaired/Different from baseline Difficult to assess due to: Impaired communication, Hard of hearing/deaf Current Attention Level: Focused Orientation Level: Oriented to person, Other (comment), Oriented to situation (nods yes/no) Following Commands: Follows one step commands inconsistently Safety/Judgement: Decreased awareness of safety, Decreased awareness of deficits Attention: Sustained Sustained Attention: Appears intact Memory: (will assess in diagnostic treatment) Awareness: Impaired Awareness Impairment: Intellectual impairment, Emergent impairment, Anticipatory impairment (did not exhibit frustration) Problem Solving: (will asssess further) Safety/Judgment: Impaired Comments: Research scientist (life sciences) states pt attempting to get OOB)   Extremity Assessment (includes Sensation/Coordination)  Upper Extremity Assessment: RUE deficits/detail RUE Deficits / Details: moves slower than the left RUE Coordination: decreased fine motor, decreased gross motor  Lower Extremity Assessment: RLE deficits/detail RLE Deficits / Details: moves antigravity; slower to respond in testing ankle DF    ADLs  Overall ADL's : Needs assistance/impaired Eating/Feeding: NPO Grooming: Moderate assistance, Sitting (with mod cues) Upper Body Bathing: Minimal assitance, Sitting (with mod cues ) Lower Body Bathing: Moderate assistance (with +2 min A sit<>stand, mod cues ) Upper Body Dressing : Moderate assistance (with mod cues) Lower Body Dressing: Maximal assistance (with +2 min A sit<>stand; mod cues) Toilet Transfer: +2 for physical assistance, Ambulation, Minimal assistance (Bil HHA) Toilet Transfer Details (indicate cue type and reason): Bed> out door with eventual sitting in recliner behind him Toileting- Clothing Manipulation and Hygiene: (mod cues)    Mobility   Overal bed mobility: Needs Assistance Bed Mobility: Supine to Sit Supine to sit: Mod assist, HOB elevated General bed mobility comments: A with bringing trunk up to sitting and scooting hips to EOB. pt has difficulty following verbal directions, but does well with gestural cueing.     Transfers  Overall transfer level: Needs assistance Equipment used: Rolling walker (2 wheeled) Transfers: Sit to/from Stand, Stand Pivot Transfers Sit to Stand: Min assist, +2 physical assistance, +2 safety/equipment Stand pivot transfers: Min assist, +2 physical assistance General transfer comment:  pt impulsive and upon sight of RW kept reaching out for it and trying to standing despite multiple verbal and gestural cues. 2nd person present for safety and to A with management of lines. pt indicating needing to have BM, so pivoted to 3-in-1 without success. RN aware.     Ambulation / Gait / Stairs / Wheelchair Mobility  Ambulation/Gait Ambulation/Gait assistance: Min assist, +2 physical assistance, +2 safety/equipment Ambulation Distance (Feet): 40 Feet (and 30) Assistive device: Rolling walker (2 wheeled) Gait Pattern/deviations: Step-through pattern, Decreased stride length, Decreased weight shift to left, Drifts right/left General Gait Details: pt needs A for management of RW, positioning within RW. pt easily distracted and begins to drift to R side with outside distractions. pt with audible wheezing during ambulation and worse as he fatigues, but O2 sats remained in 90's on RA.     Posture / Balance Dynamic Sitting Balance Sitting balance - Comments: reaches to feet and anteriorly about 8" Balance Overall balance assessment: Needs assistance Sitting-balance support: No upper extremity supported, Feet supported Sitting balance-Leahy Scale: Good Sitting balance - Comments: reaches to feet and anteriorly about 8" Standing balance support: Bilateral upper extremity supported, During  functional activity Standing balance-Leahy Scale: Poor Standing balance comment: grossly unstable, provided UE support for standing activities    Special needs/care consideration BiPAP/CPAP no CPM no Continuous Drip IV no Dialysis no  Life Vest no Oxygen Yes, 2L O2 via nasal cannula Special Bed no Trach Size no Wound Vac (area) no Skin Rn denies skin issues  Bowel mgmt: Last BM 05/26/14 ; pt. Attempted to get OOB to signal his need for BM; assisted onto [facility] and was continent of bowel Bladder mgmt: Foley cath juste Dc'd today Diabetic mgmt no     Previous Home Environment Living Arrangements: Spouse/significant other Lives With: Spouse Available Help at Discharge: Family, Available 24 hours/day Type of Home: House Home Layout: One level Home Access: Stairs to enter Entrance Stairs-Rails: None Entrance Stairs-Number of Steps: 1 Bathroom Shower/Tub: Public librarian, Architectural technologist: [place]: No  Discharge Living Setting Plans for Discharge Living Setting: Patient's home Type of Home at Discharge: House Discharge Home Layout: One level Discharge Home Access: Stairs to enter Entrance Stairs-Rails: None Entrance Stairs-Number of Steps: 1 (1 step then threshold) Discharge Bathroom Shower/Tub: Tub/shower unit Discharge Bathroom Toilet: Standard Discharge Bathroom Accessibility: Yes How Accessible: Accessible via walker Does the patient have any problems obtaining your medications?: No  Social/Family/Support Systems Patient Roles: Spouse, Parent Anticipated Caregiver: wife Halford Goetzke is retired and available 24/7 Ability/Limitations of Caregiver: Leonia Reader cannot physically lift due to back issues Caregiver Availability: 24/7 Discharge Plan Discussed with Primary Caregiver: Yes Is Caregiver In Agreement with Plan?: Yes Does Caregiver/Family have Issues with Lodging/Transportation while Pt is in Rehab?:  No    Goals/Additional Needs Patient/Family Goal for Rehab: supervision for PT/OT; min and mod assist for SLP Expected length of stay: 10-14 days Cultural Considerations: none Equipment Needs: TBA Pt/Family Agrees to Admission and willing to participate: Yes Program Orientation Provided & Reviewed with Pt/Caregiver Including Roles & Responsibilities: Yes   Decrease burden of Care through IP rehab admission: no   Possible need for SNF placement upon discharge: Not anticipated   Patient Condition: This patient's condition remains as documented in the consult dated 05/26/14 in which habilitation Physician determined and documented that the patient's condition is appropriate for intensive rehabilitative care in an inpatient rehabilitation facility. Will admit to inpatient rehab today.  Preadmission Screen Completed  By: Gerlean Ren, 05/26/2014 1:31 PM ______________________________________________________________________  Discussed status with Dr. Su Hilt At 1338 on 05/26/14 And received telephone approval for admission to rehab  Admission Coordinator: Gerlean Ren, time Date 05/26/14 at 1338         Cosigned by: Meredith Staggers, MD at 05/26/2014 2:01 PM  Revision History

## 2014-05-27 ENCOUNTER — Inpatient Hospital Stay (HOSPITAL_COMMUNITY): Payer: Medicare Other | Admitting: Speech Pathology

## 2014-05-27 ENCOUNTER — Inpatient Hospital Stay (HOSPITAL_COMMUNITY): Payer: Medicare Other | Admitting: Occupational Therapy

## 2014-05-27 ENCOUNTER — Inpatient Hospital Stay (HOSPITAL_COMMUNITY): Payer: Medicare Other

## 2014-05-27 DIAGNOSIS — I69320 Aphasia following cerebral infarction: Secondary | ICD-10-CM

## 2014-05-27 DIAGNOSIS — G8190 Hemiplegia, unspecified affecting unspecified side: Secondary | ICD-10-CM

## 2014-05-27 DIAGNOSIS — R482 Apraxia: Secondary | ICD-10-CM

## 2014-05-27 LAB — CBC WITH DIFFERENTIAL/PLATELET
Basophils Absolute: 0 10*3/uL (ref 0.0–0.1)
Basophils Relative: 0 % (ref 0–1)
Eosinophils Absolute: 0.1 10*3/uL (ref 0.0–0.7)
Eosinophils Relative: 1 % (ref 0–5)
HEMATOCRIT: 34.9 % — AB (ref 39.0–52.0)
HEMOGLOBIN: 11.7 g/dL — AB (ref 13.0–17.0)
LYMPHS ABS: 1.9 10*3/uL (ref 0.7–4.0)
Lymphocytes Relative: 20 % (ref 12–46)
MCH: 28.3 pg (ref 26.0–34.0)
MCHC: 33.5 g/dL (ref 30.0–36.0)
MCV: 84.3 fL (ref 78.0–100.0)
MONO ABS: 1 10*3/uL (ref 0.1–1.0)
Monocytes Relative: 10 % (ref 3–12)
NEUTROS ABS: 6.7 10*3/uL (ref 1.7–7.7)
NEUTROS PCT: 69 % (ref 43–77)
Platelets: 198 10*3/uL (ref 150–400)
RBC: 4.14 MIL/uL — AB (ref 4.22–5.81)
RDW: 13.7 % (ref 11.5–15.5)
WBC: 9.7 10*3/uL (ref 4.0–10.5)

## 2014-05-27 LAB — COMPREHENSIVE METABOLIC PANEL
ALBUMIN: 2.8 g/dL — AB (ref 3.5–5.2)
ALT: 22 U/L (ref 0–53)
AST: 25 U/L (ref 0–37)
Alkaline Phosphatase: 42 U/L (ref 39–117)
Anion gap: 12 (ref 5–15)
BILIRUBIN TOTAL: 1 mg/dL (ref 0.3–1.2)
BUN: 22 mg/dL (ref 6–23)
CHLORIDE: 111 mmol/L (ref 96–112)
CO2: 19 mmol/L (ref 19–32)
Calcium: 8.4 mg/dL (ref 8.4–10.5)
Creatinine, Ser: 0.81 mg/dL (ref 0.50–1.35)
GFR calc Af Amer: >90 mL/min (ref >90)
GFR calc non Af Amer: 84 mL/min — ABNORMAL LOW (ref >90)
Glucose, Bld: 105 mg/dL — ABNORMAL HIGH (ref 70–99)
Potassium: 3.6 mmol/L (ref 3.5–5.1)
Sodium: 142 mmol/L (ref 135–145)
Total Protein: 6 g/dL (ref 6.0–8.3)

## 2014-05-27 LAB — GLUCOSE, CAPILLARY: Glucose-Capillary: 94 mg/dL (ref 70–99)

## 2014-05-27 MED ORDER — CIPROFLOXACIN HCL 0.3 % OP SOLN
1.0000 [drp] | OPHTHALMIC | Status: DC
  Administered 2014-05-27 – 2014-05-29 (×6): 1 [drp] via OPHTHALMIC
  Filled 2014-05-27 (×2): qty 2.5

## 2014-05-27 NOTE — Evaluation (Addendum)
Physical Therapy Assessment and Plan  Patient Details  Name: Henry [name] MRN: [id number] Date of Birth: 12/01/1934  PT Diagnosis: Abnormality of gait, Cognitive deficits, Edema and Hemiparesis dominant Rehab Potential: Good ELOS:   10-12 days  Today's Date: 05/27/2014 PT Individual Time: 0900-1010 PT Individual Time Calculation (min): 70 min    Problem List:  Patient Active Problem List   Diagnosis Date Noted  . Left middle cerebral artery stroke 05/26/2014  . Coarctation of aorta, recurrent, post-intervention   . Expressive aphasia   . Respiratory failure   . Acute right-sided weakness   . Stroke 05/23/2014  . S/P shoulder surgery 12/24/2012  . S/P arthroscopy of shoulder 12/16/2012  . Shoulder arthritis 12/13/2012  . Biceps tendon rupture, proximal 12/13/2012  . Partial tear of right subscapularis tendon 12/13/2012  . Hypertension 05/10/2011  . Hyperlipidemia 05/10/2011  . Barrett esophagus 05/10/2011  . Esophagitis 05/10/2011  . FOOT SPRAIN, RIGHT 07/18/2007    Past Medical History:  Past Medical History  Diagnosis Date  . Hypertension   . GERD (gastroesophageal reflux disease)   . Hypercholesteremia   . HOH (hard of hearing)    Past Surgical History:  Past Surgical History  Procedure Laterality Date  . Shoulder surgery Right     torn cartiledge  . Esophagogastroduodenoscopy  10/28/2010    Procedure: ESOPHAGOGASTRODUODENOSCOPY (EGD);  Surgeon: Najeeb U Rehman, MD;  Location: [facility];  Service: Endoscopy;  Laterality: N/A;  10:30  . Colonoscopy    . Colonoscopy  05/17/2011    Procedure: COLONOSCOPY;  Surgeon: Najeeb U Rehman, MD;  Location: [facility];  Service: Endoscopy;  Laterality: N/A;  830  . Resection distal clavical Right 12/13/2012    Procedure: RESECTION DISTAL CLAVICAL;  Surgeon: Stanley E Harrison, MD;  Location: [facility] ORS;  Service: Orthopedics;  Laterality: Right;  . Shoulder arthroscopy with bicepstenotomy Right 12/13/2012    Procedure:  SHOULDER ARTHROSCOPY WITH BICEPS TENOTOMY, limited shoulder debridement;  Surgeon: Stanley E Harrison, MD;  Location: [facility];  Service: Orthopedics;  Laterality: Right;  . Esophagogastroduodenoscopy N/A 04/24/2014    Procedure: ESOPHAGOGASTRODUODENOSCOPY (EGD);  Surgeon: Najeeb U Rehman, MD;  Location: [facility];  Service: Endoscopy;  Laterality: N/A;  815  . Radiology with anesthesia N/A 05/23/2014    Procedure: RADIOLOGY WITH ANESTHESIA;  Surgeon: Medication Radiologist, MD;  Location: [facility] OR;  Service: Radiology;  Laterality: N/A;    Assessment & Plan Clinical Impression: Henry [name] is a 79 y.o.right handed male with history of hypertension, hyperlipidemia. Independent prior to admission living with his wife. Presented 05/23/2014 to [facility] with syncopal episode with findings of aphasia as well as right-sided weakness. Patient did not receive TPA. Initial cranial CT scan showed no acute intracranial abnormality. CT angio of the head showed nonvisualization of the left trocar artery, occluded proximally within the neck. Patient was transferred to [facility] for ongoing care. CT cerebral perfusion scan showed areas of ischemic core infarct within the left MCA territory. Echocardiogram with ejection fraction of 65% grade 1 diastolic dysfunction. Venous Doppler study with no signs of DVT. Carotid arteriogram completed followed by revascularization of acutely occluded left ICA proximal with stent assisted angioplasty per interventional radiology 05/23/2014. Patient initially maintained on ventilator followed by critical care medicine for a short time. Follow-up MRI of the brain 05/24/2014 showing multiple small areas of acute ischemic left middle cerebral artery territory. Patient transferred to CIR on 05/26/2014 .   Patient currently requires +2 for safety   with mobility secondary to muscle joint tightness and decreased coordination, decreased cardiorespiratoy endurance and impaired  timing and sequencing and decreased coordination, aphasia, impulsivity and decreased safety awareness.  Prior to hospitalization, patient was independent  with mobility and lived with wife Spouse in a 1 level House home.  Home access is 1 STE.1Stairs to enter.  Patient will benefit from skilled PT intervention to maximize safe functional mobility, minimize fall risk and decrease caregiver burden for planned discharge home with 24 hour supervision.  Anticipate patient will benefit from follow up [place] at discharge.  PT - End of Session Endurance Deficit: Yes Endurance Deficit Description: wheezes with any effort during transfer PT Assessment Rehab Potential (ACUTE/IP ONLY): Good PT Patient demonstrates impairments in the following area(s): Behavior;Balance;Edema;Endurance;Motor;Safety PT Transfers Functional Problem(s): Bed Mobility;Bed to Chair;Car;Furniture PT Locomotion Functional Problem(s): Ambulation;Stairs;Wheelchair Mobility PT Plan PT Intensity: Minimum of 1-2 x/day ,45 to 90 minutes PT Frequency: 5 out of 7 days PT Treatment/Interventions: Ambulation/gait training;Balance/vestibular training;Cognitive remediation/compensation;Discharge planning;Community reintegration;DME/adaptive equipment instruction;Functional mobility training;Patient/family education;Neuromuscular re-education;Psychosocial support;Splinting/orthotics;Therapeutic Exercise;Therapeutic Activities;Stair training;UE/LE Strength taining/ROM;UE/LE Coordination activities;Wheelchair propulsion/positioning PT Transfers Anticipated Outcome(s): modified independent physically for basic, supervision car PT Locomotion Anticipated Outcome(s): supervision x 150' and up/down 12 steps min assist PT Recommendation Follow Up Recommendations: Home health PT Patient destination: Home Equipment Recommended: To be determined  Skilled Therapeutic Intervention  Tx today: Therapeutic activities : basic transfers, toilet transfers, gait in  home setting without AD with min assist- pt impulsive and unsafe, stepping out of w/c before locked and with legrests in way, bed mobility to rest in supine, HOB raised.  At end of session,  bed alarm set and all needs within reach.  PT Evaluation Precautions/Restrictions Precautions Precautions: Fall Precaution Comments: h/o dizziness in standing per family (?vestibular versus orthostatic issues) Restrictions Weight Bearing Restrictions: No General   Vital SignsTherapy Vitals Pulse Rate: (!) 57 BP: 120/66 mmHg Patient Position (if appropriate): Sitting Oxygen Therapy SpO2: 96 % O2 Device: Not Delivered Pain Pain Assessment Pain Assessment: No/denies pain Home Living/Prior Functioning Home Living Available Help at Discharge: Family;Available 24 hours/day Type of Home: House Home Access: Stairs to enter [employer] of Steps: 1 Entrance Stairs-Rails: None Home Layout: One level  Lives With: Spouse Prior Function Level of Independence: Independent with gait  Able to Take Stairs?: Yes Driving: Yes Vocation: Retired Comments: loves to Office manager, retired Scientist, research (medical) Overall Cognitive Status: Impaired/Different from baseline Arousal/Alertness: Awake/alert Orientation Level: Oriented to place;Oriented to time;Oriented to situation;Oriented to person (with written choice of three ) Attention: Sustained Sustained Attention: Impaired Sustained Attention Impairment: Functional basic Memory:  (difficult to determine 2/2 global aphasia ) Awareness: Impaired Awareness Impairment: Intellectual impairment;Emergent impairment Safety/Judgment: Impaired (impulsive) Comments: quick release belt, [employer], up at [employer] station due to impulsivity  Sensation Sensation Light Touch: Appears Intact Proprioception: Appears Intact Coordination Heel Shin Test: RLE with mildly reduced speed and accuracy Motor  Motor Motor: Hemiplegia  Mobility Bed Mobility Bed Mobility:  Rolling Right;Right Sidelying to Sit;Sit to Supine Rolling Right: 5: Supervision Right Sidelying to Sit: 4: Min assist Sit to Supine: 5: Supervision Transfers Transfers: Yes Stand Pivot Transfers: 4: Min assist Locomotion  Ambulation Ambulation: Yes Ambulation/Gait Assistance: 1: +2 Total assist (for safety; pt performed 75%) Ambulation Distance (Feet): 120 Feet Assistive device: None Gait Gait: Yes Gait Pattern: Impaired Gait Pattern: Lateral trunk lean to right;Lateral trunk lean to left;Decreased trunk rotation Stairs / Additional Locomotion Stairs: Yes Stairs Assistance: 1: +2 Total assist (for safety, pt  performed 75%) Stairs Assistance Details (indicate cue type and reason): self selected step through pattern, leading with R foot Stair Management Technique: Two rails Number of Stairs: 3 Height of Stairs: 5 Wheelchair Mobility Wheelchair Mobility: No  Trunk/Postural Assessment  Cervical Assessment Cervical Assessment: Within Functional Limits Thoracic Assessment Thoracic Assessment: Within Functional Limits Lumbar Assessment Lumbar Assessment: Within Functional Limits Postural Control Postural Control: Within Functional Limits  Balance Balance Balance Assessed: Yes Static Sitting Balance Static Sitting - Balance Support: No upper extremity supported;Feet unsupported Dynamic Sitting Balance Dynamic Sitting - Balance Support: Feet supported Sitting balance - Comments: reaches slightly out of BOS in all directions Dynamic Standing Balance Dynamic Standing - Balance Support: No upper extremity supported (leaning bil LEs back against mat, for safety due to aphasia; reaches within BOs in all directions) Extremity Assessment      RLE Assessment RLE Assessment: Within Functional Limits (mild edema foot); heel cord tight LLE Assessment LLE Assessment: Within Functional Limits  FIM:  FIM - Locomotion: Ambulation Ambulation/Gait Assistance: 1: +2 Total assist (for  safety; pt performed 75%)   Refer to Care Plan for Long Term Goals  Recommendations for other services: None  Discharge Criteria: Patient will be discharged from PT if patient refuses treatment 3 consecutive times without medical reason, if treatment goals not met, if there is a change in medical status, if patient makes no progress towards goals or if patient is discharged from hospital.  The above assessment, treatment plan, treatment alternatives and goals were discussed and mutually agreed upon: by patient  , 05/27/2014, 12:42 PM  

## 2014-05-27 NOTE — Evaluation (Signed)
Speech Language Pathology Assessment and Plan  Patient Details  Name: Henry [name] MRN: [id number] Date of Birth: Feb 08, 1935  SLP Diagnosis: Aphasia;Dysarthria;Cognitive Impairments;Dysphagia  Rehab Potential: Good ELOS: 10-12 days     Today's Date: 05/27/2014 SLP Individual Time: 0800-0900 SLP Individual Time Calculation (min): 60 min   Problem List:  Patient Active Problem List   Diagnosis Date Noted  . Left middle cerebral artery stroke 05/26/2014  . Coarctation of aorta, recurrent, post-intervention   . Expressive aphasia   . Respiratory failure   . Acute right-sided weakness   . Stroke 05/23/2014  . S/P shoulder surgery 12/24/2012  . S/P arthroscopy of shoulder 12/16/2012  . Shoulder arthritis 12/13/2012  . Biceps tendon rupture, proximal 12/13/2012  . Partial tear of right subscapularis tendon 12/13/2012  . Hypertension 05/10/2011  . Hyperlipidemia 05/10/2011  . Barrett esophagus 05/10/2011  . Esophagitis 05/10/2011  . FOOT SPRAIN, RIGHT 07/18/2007   Past Medical History:  Past Medical History  Diagnosis Date  . Hypertension   . GERD (gastroesophageal reflux disease)   . Hypercholesteremia   . HOH (hard of hearing)    Past Surgical History:  Past Surgical History  Procedure Laterality Date  . Shoulder surgery Right     torn cartiledge  . Esophagogastroduodenoscopy  10/28/2010    Procedure: ESOPHAGOGASTRODUODENOSCOPY (EGD);  Surgeon: [name], MD;  [facility];  Service: Endoscopy;  Laterality: N/A;  10:30  . Colonoscopy    . Colonoscopy  05/17/2011    Procedure: COLONOSCOPY;  Surgeon: [name], MD;  [facility];  Service: Endoscopy;  Laterality: N/A;  830  . Resection distal clavical Right 12/13/2012    Procedure: RESECTION DISTAL CLAVICAL;  Surgeon: Carole Civil, MD;  Location: [facility];  Service: Orthopedics;  Laterality: Right;  . Shoulder arthroscopy with bicepstenotomy Right 12/13/2012    Procedure: SHOULDER  ARTHROSCOPY WITH BICEPS TENOTOMY, limited shoulder debridement;  Surgeon: Carole Civil, MD;  Location: [facility];  Service: Orthopedics;  Laterality: Right;  . Esophagogastroduodenoscopy N/A 04/24/2014    Procedure: ESOPHAGOGASTRODUODENOSCOPY (EGD);  Surgeon: [name], MD;  [facility];  Service: Endoscopy;  Laterality: N/A;  815  . Radiology with anesthesia N/A 05/23/2014    Procedure: RADIOLOGY WITH ANESTHESIA;  Surgeon: Medication Radiologist, MD;  Location: [facility];  Service: Radiology;  Laterality: N/A;    Assessment / Plan / Recommendation Clinical Impression   Henry [name] is a 80 y.o.right handed male with history of hypertension, hyperlipidemia. Independent prior to admission living with his wife. Presented 05/23/2014 to [place] with syncopal episode with findings of aphasia as well as right-sided weakness. Patient initially maintained on ventilator followed by critical care medicine for a short time. MRI of the brain 05/24/2014 showing multiple small areas of acute ischemic left middle cerebral artery territory. Swallow study completed 05/26/2014,  placed on a dysphagia 1 thin liquid diet.  Patient was admitted for comprehensive rehabilitation program on 05/26/2014.  SLP evaluation completed on 05/27/2014 with the following results: Pt presents with s/s of a mild-moderate orally based dysphagia characterized by right sided labial and lingual weakness which resulted in weak manipulation and prolonged oral transit of pureed textures.  Pt also exhibited mild right sided buccal residue of pureed textures following the swallow which cleared with pt's spontaneous use of alternating solids and liquids.  No overt s/s of aspiration were observed with thin liquids via cup sip.  Recommend that pt continue on dys 1 textures  and thin liquids with full supervision for use of swallowing precautions (i.e. No straws, small bites/sips, out of bed for meals).   Additionally, pt  presents with a moderately severe global aphasia with verbal expression more severely impacted than comprehension.  Pt was able to follow 1-step commands in a basic, familiar context; however, he demonstrated difficulty accurately answering basic yes/no questions and receptively identifying objects from a field of three.  Pt was noted with improved accuracy of verbal output during automatic sequences in comparison to confrontational naming or spontaneous verbalization.   Verbal errors were characterized by semantic and phonemic paraphasias as well as perseveration of previous verbalizations.  Pt would benefit from further evaluation for reading comprehension given inconsistent responses to written stimuli.  Pt's functional communication was further impacted by dysarthria resulting from the abovementioned oral motor impairments which was characterized by imprecise articulation of consonants and decreased vocal intensity and impacted intelligibility at the word level.   Pt also presents with moderately-severe cognitive deficits characterized by decreased sustained attention, impulsivity, and poor intellectual/emergent awareness of deficits which impact higher level cognitive processes.  Pt would benefit from skilled ST while inpatient in order to maximize functional independence and reduce burden of care prior to discharge.  Anticipate that pt will need 24/7 supervision, assistance for medication and financial management, and ST follow up in either the home health or outpatient setting at discharge.     Skilled Therapeutic Interventions          Cognitive-linguistic and bedside swallowing evaluation completed with results and recommendations reviewed with patient.  Pt educated on rationale behind skilled ST and goals to be addressed while inpatient.  Pt left up at nursing station in [place] with quick release belt due to impulsivity.      SLP Assessment  Patient will need skilled Speech Lanaguage Pathology  Services during CIR admission    Recommendations  Diet Recommendations: Dysphagia 1 (Puree);Thin liquid Liquid Administration via: Cup;No straw Medication Administration: Crushed with puree Supervision: Patient able to self feed;Full supervision/cueing for compensatory strategies Compensations: Slow rate;Small sips/bites;Check for pocketing Postural Changes and/or Swallow Maneuvers: Seated upright 90 degrees;Upright 30-60 min after meal Oral Care Recommendations: Oral care BID Patient destination: Home Follow up Recommendations: Home Health SLP;Outpatient SLP;24 hour supervision/assistance Equipment Recommended: None recommended by SLP    SLP Frequency 3 to 5 out of 7 days   SLP Treatment/Interventions Cognitive remediation/compensation;Cueing hierarchy;Dysphagia/aspiration precaution training;Functional tasks;Patient/family education;Oral motor exercises;Internal/external aids;Environmental controls    Pain Pain Assessment Pain Assessment: No/denies pain Prior Functioning Cognitive/Linguistic Baseline: Within functional limits Type of Home: House  Lives With: Spouse Available Help at Discharge: Family;Available 24 hours/day Vocation: Retired  Industrial/product designer Term Goals: Week 1: SLP Short Term Goal 1 (Week 1): Pt will use multimodal communication to convey immediate needs/wants with max verbal and visual cues  SLP Short Term Goal 2 (Week 1): Pt will follow 2 step commands during basic, familiar ADLs/home management tasks with max assist verbal, visual, and demonstration cues.   SLP Short Term Goal 3 (Week 1): Pt will answer immediate, biographical, and/or environmental yes/no questions accurately with max assist verbal and visual cues.   SLP Short Term Goal 4 (Week 1): Pt will sustain attention to a basic familiar home mangement task for 3-5 minute intervals with mod cues verbal, visual, and tactile cues for redirection.   SLP Short Term Goal 5 (Week 1): Pt will consume dys 1 textures and thin  liquids with min verbal and visual cues for use of rate  and portion control as well as monitoring and correcting right sided buccal residue over 3 consecutive sessions to demonstrate readiness for diet progression.  SLP Short Term Goal 6 (Week 1): Pt will return demonstration of 2 safety precautions during basic functional tasks with max assist verbal,visual, and tactile cues.    See FIM for current functional status Refer to Care Plan for Long Term Goals  Recommendations for other services: None  Discharge Criteria: Patient will be discharged from SLP if patient refuses treatment 3 consecutive times without medical reason, if treatment goals not met, if there is a change in medical status, if patient makes no progress towards goals or if patient is discharged from hospital.  The above assessment, treatment plan, treatment alternatives and goals were discussed and mutually agreed upon: No family available/patient unable  Emilio Math 05/27/2014, 3:41 PM

## 2014-05-27 NOTE — Progress Notes (Signed)
Patient information reviewed and entered into eRehab system by Lyne Khurana, RN, CRRN, PPS Coordinator.  Information including medical coding and functional independence measure will be reviewed and updated through discharge.     Per nursing patient was given "Data Collection Information Summary for Patients in Inpatient Rehabilitation Facilities with attached "Privacy Act Statement-Health Care Records" upon admission.  

## 2014-05-27 NOTE — Evaluation (Signed)
Occupational Therapy Assessment and Plan  Patient Details  Name: Henry [name] MRN: [id number] Date of Birth: 25-Sep-1934  OT Diagnosis: apraxia, cognitive deficits, hemiplegia affecting dominant side and muscle weakness (generalized) Rehab Potential: Rehab Potential (ACUTE ONLY): Good ELOS: 10-14 days   Today's Date: 05/27/2014 OT Individual Time: 1300-1415 OT Individual Time Calculation (min): 75 min     Problem List:  Patient Active Problem List   Diagnosis Date Noted  . Left middle cerebral artery stroke 05/26/2014  . Coarctation of aorta, recurrent, post-intervention   . Expressive aphasia   . Respiratory failure   . Acute right-sided weakness   . Stroke 05/23/2014  . S/P shoulder surgery 12/24/2012  . S/P arthroscopy of shoulder 12/16/2012  . Shoulder arthritis 12/13/2012  . Biceps tendon rupture, proximal 12/13/2012  . Partial tear of right subscapularis tendon 12/13/2012  . Hypertension 05/10/2011  . Hyperlipidemia 05/10/2011  . Barrett esophagus 05/10/2011  . Esophagitis 05/10/2011  . FOOT SPRAIN, RIGHT 07/18/2007    Past Medical History:  Past Medical History  Diagnosis Date  . Hypertension   . GERD (gastroesophageal reflux disease)   . Hypercholesteremia   . HOH (hard of hearing)    Past Surgical History:  Past Surgical History  Procedure Laterality Date  . Shoulder surgery Right     torn cartiledge  . Esophagogastroduodenoscopy  10/28/2010    Procedure: ESOPHAGOGASTRODUODENOSCOPY (EGD);  Surgeon: [name], MD;  [facility];  Service: Endoscopy;  Laterality: N/A;  10:30  . Colonoscopy    . Colonoscopy  05/17/2011    Procedure: COLONOSCOPY;  Surgeon: [name], MD;  [facility];  Service: Endoscopy;  Laterality: N/A;  830  . Resection distal clavical Right 12/13/2012    Procedure: RESECTION DISTAL CLAVICAL;  Surgeon: Carole Civil, MD;  [facility]: [facility];  Service: Orthopedics;  Laterality: Right;  . Shoulder  arthroscopy with bicepstenotomy Right 12/13/2012    Procedure: SHOULDER ARTHROSCOPY WITH BICEPS TENOTOMY, limited shoulder debridement;  Surgeon: Carole Civil, MD;  Location: [facility];  Service: Orthopedics;  Laterality: Right;  . Esophagogastroduodenoscopy N/A 04/24/2014    Procedure: ESOPHAGOGASTRODUODENOSCOPY (EGD);  Surgeon: [name], MD;  [facility];  Service: Endoscopy;  Laterality: N/A;  815  . Radiology with anesthesia N/A 05/23/2014    Procedure: RADIOLOGY WITH ANESTHESIA;  Surgeon: Medication Radiologist, MD;  Location: [place];  Service: Radiology;  Laterality: N/A;    Assessment & Plan Clinical Impression: Patient is a 80 y.o. right handed male with history of hypertension, hyperlipidemia. Independent prior to admission living with his wife. Presented 05/23/2014 to [place] with syncopal episode with findings of aphasia as well as right-sided weakness. Patient did not receive TPA. Initial cranial CT scan showed no acute intracranial abnormality. CT angio of the head showed nonvisualization of the left trocar artery, occluded proximally within the neck. Patient was transferred to [facility] for ongoing care. CT cerebral perfusion scan showed areas of ischemic core infarct within the left MCA territory. Echocardiogram with ejection fraction of 85% grade 1 diastolic dysfunction. Venous Doppler study with no signs of DVT. Carotid arteriogram completed followed by revascularization of acutely occluded left ICA proximal with stent assisted angioplasty per interventional radiology 05/23/2014. Patient initially maintained on ventilator followed by critical care medicine for a short time. Follow-up MRI of the brain 05/24/2014 showing multiple small areas of acute ischemic left middle cerebral artery territory. Neurology follow-up currently maintained on aspirin and Plavix for CVA  prophylaxis as well as subcutaneous Lovenox for DVT prophylaxis. Presently nothing by  mouth await swallow study completed 05/26/2014 placed on a dysphagia 1 thin liquid diet. Occupational therapy evaluation completed 05/25/2014 with recommendations of physical medicine rehabilitation consult.   Patient transferred to CIR on 05/26/2014 .    Patient currently requires max with basic self-care skills secondary to muscle weakness, decreased cardiorespiratoy endurance, impaired timing and sequencing, motor apraxia and decreased coordination and decreased safety awareness and impulsivity and aphasia.  Prior to hospitalization, patient could complete ADLs with independent .  Patient will benefit from skilled intervention to decrease level of assist with basic self-care skills prior to discharge home with care partner.  Anticipate patient will require 24 hour supervision and follow up home health.  OT - End of Session Activity Tolerance: Tolerates 30+ min activity with multiple rests Endurance Deficit: Yes Endurance Deficit Description: wheezes with any effort during activity OT Assessment Rehab Potential (ACUTE ONLY): Good OT Patient demonstrates impairments in the following area(s): Balance;Cognition;Endurance;Motor;Safety OT Basic ADL's Functional Problem(s): Grooming;Bathing;Dressing;Toileting;Eating OT Transfers Functional Problem(s): Toilet;Tub/Shower OT Additional Impairment(s): Fuctional Use of Upper Extremity OT Plan OT Intensity: Minimum of 1-2 x/day, 45 to 90 minutes OT Frequency: 5 out of 7 days OT Duration/Estimated Length of Stay: 10-14 days OT Treatment/Interventions: Balance/vestibular training;Cognitive remediation/compensation;Community reintegration;Discharge planning;Disease mangement/prevention;DME/adaptive equipment instruction;Functional mobility training;Neuromuscular re-education;Patient/family education;Psychosocial support;Self Care/advanced ADL retraining;Therapeutic Activities;Therapeutic Exercise;UE/LE Strength taining/ROM;UE/LE Coordination activities OT  Self Feeding Anticipated Outcome(s): Mod I OT Basic Self-Care Anticipated Outcome(s): Supervision OT Toileting Anticipated Outcome(s): Supervision OT Bathroom Transfers Anticipated Outcome(s): Supervision OT Recommendation Patient destination: Home Follow Up Recommendations: Outpatient OT;24 hour supervision/assistance Equipment Recommended: Tub/shower bench   Skilled Therapeutic Intervention OT eval completed with wife present secondary to pt's aphasia discussing purpose of OT, POC, goals, and ELOS.  ADL assessment completed with bathing at sit > stand level in room shower and dressing at sit > stand level in room at sink.  Min-mod assist with stand pivot transfers with pt requiring gestures with cues due to aphasia.  Pt with verbalizations of yes/no but unsure how accurate.  Noted perseveration with writing name with pt writing "Ronalddddd" Pt attempted to apply deodorant to toothbrush, when provided with toothpaste pt appropriately applied it to toothbrush and completed oral care.  Ambulated to toilet with min/steady assist where pt voided in standing.    OT Evaluation Precautions/Restrictions  Precautions Precautions: Fall Precaution Comments: h/o dizziness in standing per family (?vestibular versus orthostatic issues) Pain Pain Assessment Pain Assessment: No/denies pain Home Living/Prior Functioning Home Living Available Help at Discharge: Family, Available 24 hours/day Type of Home: House Home Access: Stairs to enter Technical brewer of Steps: 1 Entrance Stairs-Rails: None Home Layout: One level  Lives With: Spouse IADL History Homemaking Responsibilities: No Current License: Yes Mode of Transportation: Musician Occupation: Retired Prior Function Level of Independence: Independent with gait, Independent with basic ADLs, Independent with homemaking with ambulation  Able to Take Stairs?: Yes Driving: Yes Vocation: Retired Comments: loves to Office manager, retired Network engineer,  would walk 3 miles/day with friend ADL  See FIM Vision/Perception  Vision- History Baseline Vision/History: Wears glasses Wears Glasses: At all times Patient Visual Report: No change from baseline Vision- Assessment Vision Assessment?: Vision impaired- to be further tested in functional context Additional Comments: difficult to assess secondary to aphasia, however noted undershooting with 9 hole peg test which may be visual component  Cognition Overall Cognitive Status: Impaired/Different from baseline Arousal/Alertness: Awake/alert Orientation Level: Oriented to place;Oriented to time;Oriented to situation;Oriented to  person (with written choice of three ) Attention: Sustained Sustained Attention: Impaired Sustained Attention Impairment: Functional basic Memory:  (difficult to determine 2/2 global aphasia ) Awareness: Impaired Awareness Impairment: Intellectual impairment;Emergent impairment Safety/Judgment: Impaired (impulsive) Comments: quick release belt, [employer], up at [employer] station due to impulsivity  Sensation Sensation Light Touch: Appears Intact Stereognosis: Not tested Hot/Cold: Appears Intact Proprioception: Appears Intact Coordination Fine Motor Movements are Fluid and Coordinated: No Finger Nose Finger Test: difficult to assess secondary to aphasia, but appears [facility] 9 Hole Peg Test: Rt: 3:37 to complete only placing 6 pegs - noted decreased [place] with turning pegs in fingers and undershooting when attempting to place in holes, did not assess Lt Extremity/Trunk Assessment RUE Assessment RUE Assessment: Exceptions to [facility] (difficult to assess due to aphasia, strength grossly 3/5, ROM WFL) LUE Assessment LUE Assessment: Within Functional Limits  FIM:  FIM - Grooming Grooming Steps: Wash, rinse, dry face;Wash, rinse, dry hands;Oral care, brush teeth, clean dentures;Brush, comb hair Grooming: 4: Steadying assist  or patient completes 3 of 4 or 4 of 5 steps FIM -  Bathing Bathing Steps Patient Completed: Chest;Right Arm;Left Arm;Abdomen;Front perineal area;Buttocks;Right upper leg;Left upper leg Bathing: 4: Min-Patient completes 8-9 12f10 parts or 75+ percent FIM - Upper Body Dressing/Undressing Upper body dressing/undressing steps patient completed: Thread/unthread right sleeve of pullover shirt/dresss;Thread/unthread left sleeve of pullover shirt/dress;Put head through opening of pull over shirt/dress Upper body dressing/undressing: 4: Min-Patient completed 75 plus % of tasks FIM - Lower Body Dressing/Undressing Lower body dressing/undressing steps patient completed: Don/Doff left shoe;Fasten/unfasten right shoe;Fasten/unfasten left shoe Lower body dressing/undressing: 2: Max-Patient completed 25-49% of tasks FIM - Toileting Toileting: 2: Max-Patient completed 1 of 3 steps FIM - Bed/Chair Transfer Bed/Chair Transfer: 4: Bed > Chair or W/C: Min A (steadying Pt. > 75%);4: Chair or W/C > Bed: Min A (steadying Pt. > 75%) FIM - TRadio producerDevices: Grab bars Toilet Transfers: 4-To toilet/BSC: Min A (steadying Pt. > 75%);4-From toilet/BSC: Min A (steadying Pt. > 75%) FIM - TSystems developerDevices: Shower chair;Grab bars;Walk in shower Tub/shower Transfers: 3-Into Tub/Shower: Mod A (lift or lower/lift 2 legs);4-Out of Tub/Shower: Min A (steadying Pt. > 75%/lift 1 leg)   Refer to Care Plan for Long Term Goals  Recommendations for other services: None  Discharge Criteria: Patient will be discharged from OT if patient refuses treatment 3 consecutive times without medical reason, if treatment goals not met, if there is a change in medical status, if patient makes no progress towards goals or if patient is discharged from hospital.  The above assessment, treatment plan, treatment alternatives and goals were discussed and mutually agreed upon: by patient and by family  HEllwood DenseSTattnall [facility] Company [facility]4/13/2016, 3:20 PM

## 2014-05-27 NOTE — Progress Notes (Signed)
80 y.o.right handed  male with history of hypertension, hyperlipidemia. Independent prior to admission living with his wife. Presented 05/23/2014 to [facility] with syncopal episode with findings of aphasia as well as right-sided weakness. Patient did not receive TPA. Initial cranial CT scan showed no acute intracranial abnormality. CT angio of the head showed nonvisualization of the left trocar artery, occluded proximally within the neck. Patient was transferred to [facility] for ongoing care. CT cerebral perfusion scan showed areas of ischemic core infarct within the left MCA territory. Echocardiogram with ejection fraction of 40% grade 1 diastolic dysfunction. Venous Doppler study with no signs of DVT. Carotid arteriogram completed followed by revascularization of acutely occluded left ICA proximal with stent assisted angioplasty per interventional radiology 05/23/2014. Patient initially maintained on ventilator followed by critical care medicine for a short time. Follow-up MRI of the brain 05/24/2014 showing multiple small areas of acute ischemic left middle cerebral artery territory. Neurology follow-up currently maintained on aspirin and Plavix for CVA prophylaxis as well as subcutaneous Lovenox for DVT prophylaxis. Presently nothing by mouth await swallow study completed 05/26/2014 placed on a dysphagia 1 thin liquid diet  Subjective/Complaints: A phasic, answers simple questions   Objective: Vital Signs: Blood pressure 154/67, pulse 57, temperature 98.1 F (36.7 C), temperature source Oral, SpO2 99 %. Dg Chest Port 1 View  05/26/2014   CLINICAL DATA:  Cerebral vascular accident  EXAM: PORTABLE CHEST - 1 VIEW  COMPARISON:  May 25, 2014  FINDINGS: Endotracheal tube and nasogastric tube have been removed. No pneumothorax. No edema or consolidation. Heart is mildly enlarged with pulmonary vascularity within normal limits. There is atherosclerotic change in the aortic arch. No  adenopathy.  IMPRESSION: No edema or consolidation. No pneumothorax. Stable cardiac prominence.   Electronically Signed   By: Lowella Grip III M.D.   On: 05/26/2014 08:14   Dg Swallowing Func-speech Pathology  05/26/2014    Objective Swallowing Evaluation:    Patient Details  Name: Henry [name] MRN: [id number] Date of Birth: [birth year]  Today's Date: 05/26/2014 Time: SLP Start Time (ACUTE ONLY): 1200-SLP Stop Time (ACUTE ONLY): 1225 SLP Time Calculation (min) (ACUTE ONLY): 25 min  Past Medical History:  Past Medical History  Diagnosis Date  . Hypertension   . GERD (gastroesophageal reflux disease)   . Hypercholesteremia   . HOH (hard of hearing)    Past Surgical History:  Past Surgical History  Procedure Laterality Date  . Shoulder surgery Right     torn cartiledge  . Esophagogastroduodenoscopy  10/28/2010    Procedure: ESOPHAGOGASTRODUODENOSCOPY (EGD);  Surgeon: [name],  MD;  [facility];  Service: Endoscopy;  Laterality: N/A;   10:30  . Colonoscopy    . Colonoscopy  05/17/2011    Procedure: COLONOSCOPY;  Surgeon: [name], MD;  Location: [facility];  Service: Endoscopy;  Laterality: N/A;  830  . Resection distal clavical Right 12/13/2012    Procedure: RESECTION DISTAL CLAVICAL;  Surgeon: Carole Civil, MD;   Location: [facility];  Service: Orthopedics;  Laterality: Right;  . Shoulder arthroscopy with bicepstenotomy Right 12/13/2012    Procedure: SHOULDER ARTHROSCOPY WITH BICEPS TENOTOMY, limited shoulder  debridement;  Surgeon: Carole Civil, MD;  Location: [facility];   Service: Orthopedics;  Laterality: Right;  . Esophagogastroduodenoscopy N/A 04/24/2014    Procedure: ESOPHAGOGASTRODUODENOSCOPY (EGD);  Surgeon: [name],  MD;  [facility];  Service: Endoscopy;  Laterality: N/A;  815   HPI:  HPI: 80 y.o. male hx of HTN, HLD, GERD, HOH admitted with syncopal  episode, aphasia and right sided weakness. Per MD report findings  pertinent for nonvisualization  of the left ICA, occluded proximally within  the neck. MRI multi small areas of acute ischemia LEFT middle cerebral  artery territory. Small vascular flow void, less likely micro hemorrhage  LEFT posterior frontal lobe without lobar hematoma. Intubated 4/9-4/11 am.  CXR low lung volumes, with airspace and interstitial opacities potentially  reflecting atelectasis and/ or consolidation. MBS recommended to fully  assess motor and sensory integrity of swallow.  No Data Recorded  Assessment / Plan / Recommendation CHL IP CLINICAL IMPRESSIONS 05/26/2014  Dysphagia Diagnosis Mild pharyngeal phase dysphagia;Mild oral phase  dysphagia;Moderate oral phase dysphagia  Clinical impression Pt exhibited mild pharyngeal sensory based dysphagia  due to decreased sensation leading to delayed swallow initiation  (valleculae). Motor components primarily intact with adequate laryngeal  elevation and epiglottic inversion preventing laryngeal penetration or  aspiration during the study. Mild intermittent tongue base residue due to  decreased pharyngeal wall/tongue base contraction. No vallecular or  pyriform sinus residue present. Unable to scan esophagus due to body  habitus. Mild-moderate oral dysphagia with right lateral sulci pocketing  without sensation and mild awareness of right leakage. Recommend Dys 1  texture and thin liquids, small sips, no straws, crush pills, full  supervision and check for right sulci pocketing.       CHL IP TREATMENT RECOMMENDATION 05/26/2014  Treatment Plan Recommendations Therapy as outlined in treatment plan below      CHL IP DIET RECOMMENDATION 05/26/2014  Diet Recommendations Dysphagia 1 (Puree);Thin liquid  Liquid Administration via Cup;No straw  Medication Administration Crushed with puree  Compensations Slow rate;Small sips/bites;Check for pocketing  Postural Changes and/or Swallow Maneuvers Seated upright 90  degrees;Upright 30-60 min after meal     CHL IP OTHER RECOMMENDATIONS 05/26/2014  Recommended  Consults (None)  Oral Care Recommendations Oral care BID  Other Recommendations (None)     CHL IP FOLLOW UP RECOMMENDATIONS 05/26/2014  Follow up Recommendations Inpatient Rehab     CHL IP FREQUENCY AND DURATION 05/26/2014  Speech Therapy Frequency (ACUTE ONLY) min 2x/week  Treatment Duration 2 weeks     Pertinent Vitals/Pain none    SLP Swallow Goals No flowsheet data found.  No flowsheet data found.    CHL IP REASON FOR REFERRAL 05/26/2014  Reason for Referral Objectively evaluate swallowing function     CHL IP ORAL PHASE 05/26/2014  Lips (None)  Tongue (None)  Mucous membranes (None)  Nutritional status (None)  Other (None)  Oxygen therapy (None)  Oral Phase Impaired  Oral - Pudding Teaspoon (None)  Oral - Pudding Cup (None)  Oral - Honey Teaspoon (None)  Oral - Honey Cup (None)  Oral - Honey Syringe (None)  Oral - Nectar Teaspoon (None)  Oral - Nectar Cup Right pocketing in lateral sulci;Right anterior bolus  loss  Oral - Nectar Straw (None)  Oral - Nectar Syringe (None)  Oral - Ice Chips (None)  Oral - Thin Teaspoon (None)  Oral - Thin Cup Right pocketing in lateral sulci;Right anterior bolus loss   Oral - Thin Straw (None)  Oral - Thin Syringe (None)  Oral - Puree (None)  Oral - Mechanical Soft (None)  Oral - Regular Weak lingual manipulation;Delayed oral transit  Oral - Multi-consistency (None)  Oral - Pill (None)  Oral Phase - Comment (None)      CHL IP PHARYNGEAL PHASE 05/26/2014  Pharyngeal  Phase Impaired  Pharyngeal - Pudding Teaspoon (None)  Penetration/Aspiration details (pudding teaspoon) (None)  Pharyngeal - Pudding Cup (None)  Penetration/Aspiration details (pudding cup) (None)  Pharyngeal - Honey Teaspoon (None)  Penetration/Aspiration details (honey teaspoon) (None)  Pharyngeal - Honey Cup (None)  Penetration/Aspiration details (honey cup) (None)  Pharyngeal - Honey Syringe (None)  Penetration/Aspiration details (honey syringe) (None)  Pharyngeal - Nectar Teaspoon Delayed swallow initiation;Premature  spillage  to valleculae  Penetration/Aspiration details (nectar teaspoon) (None)  Pharyngeal - Nectar Cup Delayed swallow initiation;Premature spillage to  valleculae  Penetration/Aspiration details (nectar cup) (None)  Pharyngeal - Nectar Straw (None)  Penetration/Aspiration details (nectar straw) (None)  Pharyngeal - Nectar Syringe (None)  Penetration/Aspiration details (nectar syringe) (None)  Pharyngeal - Ice Chips (None)  Penetration/Aspiration details (ice chips) (None)  Pharyngeal - Thin Teaspoon Delayed swallow initiation;Premature spillage  to valleculae  Penetration/Aspiration details (thin teaspoon) (None)  Pharyngeal - Thin Cup Delayed swallow initiation;Premature spillage to  valleculae  Penetration/Aspiration details (thin cup) (None)  Pharyngeal - Thin Straw Delayed swallow initiation;Premature spillage to  valleculae  Penetration/Aspiration details (thin straw) (None)  Pharyngeal - Thin Syringe (None)  Penetration/Aspiration details (thin syringe') (None)  Pharyngeal - Puree Delayed swallow initiation;Premature spillage to  valleculae  Penetration/Aspiration details (puree) (None)  Pharyngeal - Mechanical Soft (None)  Penetration/Aspiration details (mechanical soft) (None)  Pharyngeal - Regular Reduced pharyngeal peristalsis  Penetration/Aspiration details (regular) (None)  Pharyngeal - Multi-consistency (None)  Penetration/Aspiration details (multi-consistency) (None)  Pharyngeal - Pill (None)  Penetration/Aspiration details (pill) (None)  Pharyngeal Comment (None)     CHL IP CERVICAL ESOPHAGEAL PHASE 05/26/2014  Cervical Esophageal Phase WFL  Pudding Teaspoon (None)  Pudding Cup (None)  Honey Teaspoon (None)  Honey Cup (None)  Honey Syringe (None)  Nectar Teaspoon (None)  Nectar Cup (None)  Nectar Straw (None)  Nectar Syringe (None)  Thin Teaspoon (None)  Thin Cup (None)  Thin Straw (None)  Thin Syringe (None)  Cervical Esophageal Comment (None)    No flowsheet data found.         Henry [name] 05/26/2014, 2:00 PM   Henry [name].Ed CCC-SLP Pager [phone]      Results for orders placed or performed during the hospital encounter of 05/26/14 (from the past 72 hour(s))  CBC WITH DIFFERENTIAL     Status: Abnormal   Collection Time: 05/27/14  7:20 AM  Result Value Ref Range   WBC 9.7 4.0 - 10.5 K/uL   RBC 4.14 (L) 4.22 - 5.81 MIL/uL   Hemoglobin 11.7 (L) 13.0 - 17.0 g/dL   HCT 34.9 (L) 39.0 - 52.0 %   MCV 84.3 78.0 - 100.0 fL   MCH 28.3 26.0 - 34.0 pg   MCHC 33.5 30.0 - 36.0 g/dL   RDW 13.7 11.5 - 15.5 %   Platelets 198 150 - 400 K/uL   Neutrophils Relative % 69 43 - 77 %   Neutro Abs 6.7 1.7 - 7.7 K/uL   Lymphocytes Relative 20 12 - 46 %   Lymphs Abs 1.9 0.7 - 4.0 K/uL   Monocytes Relative 10 3 - 12 %   Monocytes Absolute 1.0 0.1 - 1.0 K/uL   Eosinophils Relative 1 0 - 5 %   Eosinophils Absolute 0.1 0.0 - 0.7 K/uL   Basophils Relative 0 0 - 1 %   Basophils Absolute 0.0 0.0 - 0.1 K/uL     HEENT: normal Cardio: RRR and no murmur Resp: CTA B/L GI: BS positive, Distention and no tenderness  Extremity:  Edema pedal Skin:   Bruise multiple UE Neuro: Alert/Oriented, Abnormal Sensory delayed response to UE pinch, Abnormal Motor 4/5 in the right deltoid, biceps, triceps, grip, hip flexor, knee extensor and ankle dorsiflexor, Aphasic and Apraxic Musc/Skel:  Other no pain with upper extremity or lower extremity active range of motion Gen. no acute distress   Assessment/Plan: 1. Functional deficits secondary to left MCA distribution infarct with right hemiparesis and aphasia as well as dysphagia  which require 3+ hours per day of interdisciplinary therapy in a comprehensive inpatient rehab setting. Physiatrist is providing close team supervision and 24 hour management of active medical problems listed below. Physiatrist and rehab team continue to assess barriers to discharge/monitor patient progress toward functional and medical goals. FIM:                                   Medical Problem List and Plan: 1. Functional deficits secondary to left MCA territory infarct with ICA occlusion status post mechanical thrombectomy with left ICA stenting, embolic secondary to large vessel atherosclerosis 2.  DVT Prophylaxis/Anticoagulation: Subcutaneous Lovenox. Monitor platelet counts and any signs of bleeding. Venous Doppler studies 05/25/2014 negative 3. Pain Management: Tylenol as needed 4. Dysphagia. Dysphagia 1 thin liquid diet. Follow-up speech therapy 5. Neuropsych: This patient is capable of making decisions on his own behalf. 6. Skin/Wound Care: Routine skin checks 7. Fluids/Electrolytes/Nutrition: Strict I&O's with follow-up chemistries 8. Hyperlipidemia. Lipitor 9. Hypertension. Allowing for permissive hypertension. No present antihypertensive medication. Patient on Norvasc 5 mg daily, hydrochlorothiazide 25 mg daily prior to admission. Resume as tolerated 10. GERD. Protonix   LOS (Days) 1 A FACE TO FACE EVALUATION WAS PERFORMED  Henry [name] 05/27/2014, 8:12 AM

## 2014-05-28 ENCOUNTER — Inpatient Hospital Stay (HOSPITAL_COMMUNITY): Payer: Medicare Other | Admitting: Occupational Therapy

## 2014-05-28 ENCOUNTER — Inpatient Hospital Stay (HOSPITAL_COMMUNITY): Payer: Medicare Other | Admitting: Speech Pathology

## 2014-05-28 ENCOUNTER — Inpatient Hospital Stay (HOSPITAL_COMMUNITY): Payer: Medicare Other

## 2014-05-28 DIAGNOSIS — R1314 Dysphagia, pharyngoesophageal phase: Secondary | ICD-10-CM | POA: Diagnosis present

## 2014-05-28 DIAGNOSIS — I69320 Aphasia following cerebral infarction: Secondary | ICD-10-CM

## 2014-05-28 DIAGNOSIS — I69920 Aphasia following unspecified cerebrovascular disease: Secondary | ICD-10-CM

## 2014-05-28 LAB — PLATELET INHIBITION P2Y12: PLATELET FUNCTION P2Y12: 113 [PRU] — AB (ref 194–418)

## 2014-05-28 MED ORDER — ENSURE ENLIVE PO LIQD
237.0000 mL | Freq: Three times a day (TID) | ORAL | Status: DC
  Administered 2014-05-28 – 2014-06-04 (×15): 237 mL via ORAL

## 2014-05-28 MED ORDER — ALBUTEROL SULFATE (2.5 MG/3ML) 0.083% IN NEBU
2.5000 mg | INHALATION_SOLUTION | Freq: Two times a day (BID) | RESPIRATORY_TRACT | Status: DC
  Administered 2014-05-28 – 2014-05-30 (×4): 2.5 mg via RESPIRATORY_TRACT
  Filled 2014-05-28 (×4): qty 3

## 2014-05-28 MED ORDER — ALBUTEROL SULFATE (2.5 MG/3ML) 0.083% IN NEBU
2.5000 mg | INHALATION_SOLUTION | Freq: Four times a day (QID) | RESPIRATORY_TRACT | Status: DC
  Administered 2014-05-28: 2.5 mg via RESPIRATORY_TRACT
  Filled 2014-05-28: qty 3

## 2014-05-28 NOTE — Progress Notes (Signed)
Speech Language Pathology Daily Session Note  Patient Details  Name: LORENA CLEARMAN MRN: [id number] Date of Birth: 1934/06/06  Today's Date: 05/28/2014 SLP Individual Time: 3612-2449 SLP Individual Time Calculation (min): 75 min  Short Term Goals: Week 1: SLP Short Term Goal 1 (Week 1): Pt will use multimodal communication to convey immediate needs/wants with max verbal and visual cues  SLP Short Term Goal 2 (Week 1): Pt will follow 2 step commands during basic, familiar ADLs/home management tasks with max assist verbal, visual, and demonstration cues.   SLP Short Term Goal 3 (Week 1): Pt will answer immediate, biographical, and/or environmental yes/no questions accurately with max assist verbal and visual cues.   SLP Short Term Goal 4 (Week 1): Pt will sustain attention to a basic familiar home mangement task for 3-5 minute intervals with mod cues verbal, visual, and tactile cues for redirection.   SLP Short Term Goal 5 (Week 1): Pt will consume dys 1 textures and thin liquids with min verbal and visual cues for use of rate and portion control as well as monitoring and correcting right sided buccal residue over 3 consecutive sessions to demonstrate readiness for diet progression.  SLP Short Term Goal 6 (Week 1): Pt will return demonstration of 2 safety precautions during basic functional tasks with max assist verbal,visual, and tactile cues.    Skilled Therapeutic Interventions:  Pt was seen for skilled ST targeting cognitive goals.  Upon arrival, pt was seated upright in wheelchair at the nursing station.  Pt indicated that he had been up walking today and stated "I need to walk now."  SLP provided min guard for balance as well as mod verbal and tactile cues for redirection when ambulating around the unit.  SLP took pt to the ADL kitchen to attempt to make a functional task.  Pt indicated that he did not like any of the food choices available in the kitchen cabinet but nodded head and verbalized  "yeah" when asked if he liked graham crackers and peanut butter.  SLP ambulated with pt to the nursing station and provided mod visual and verbal cues to locate packages of crackers and peanut butter.  Pt opened containers independently with extra time, demonstrating improved automaticity of functional problem solving.  Pt demonstrated prolonged mastication of regular textures with delayed oral transit of materials and moderate diffuse residue post swallow; however, with increased time and min verbal cues for use of a liquid wash, pt effectively cleared residual solids from oral cavity.  Recommend that pt be advanced to dys 2 textures with continued thin liquids and full supervision for use of swallowing precautions,  RN made aware.  Pt was noted with improved spontaneous verbal output today in comparison to yesterday's evaluation and was noted to convey basic, immediate needs/wants and biographical information at the phrase level with mod demonstration cues and repetition.  Upon return to room, pt required mod safety cues due to impulsivity; therefore, SLP initiated skilled education for use of call bell.  Pt returned demonstration of call bell with mod verbal and visual cues x3.  Bed alarm activated at the end of session and shortly after SLP left, alarm was noted to go off.  Pt was trying to get up out of bed, indicating that he wanted to "watch television."  SLP spoke with RN who was in agreement with trial of leaving pt upright in recliner with quick release belt.  Continue per current plan of care.   FIM:  Comprehension Comprehension Mode: Auditory  Comprehension: 4-Understands basic 75 - 89% of the time/requires cueing 10 - 24% of the time Expression Expression Mode: Verbal Expression: 2-Expresses basic 25 - 49% of the time/requires cueing 50 - 75% of the time. Uses single words/gestures. Social Interaction Social Interaction: 3-Interacts appropriately 50 - 74% of the time - May be physically or  verbally inappropriate. Problem Solving Problem Solving: 3-Solves basic 50 - 74% of the time/requires cueing 25 - 49% of the time Memory Memory: 2-Recognizes or recalls 25 - 49% of the time/requires cueing 51 - 75% of the time FIM - Eating Eating Activity: 5: Set-up assist for open containers;5: Needs verbal cues/supervision;4: Helper checks for pocketed food  Pain Pain Assessment Pain Assessment: No/denies pain  Therapy/Group: Individual Therapy  Javonda Suh, Selinda Orion 05/28/2014, 4:08 PM

## 2014-05-28 NOTE — Progress Notes (Signed)
Social Work Assessment and Plan  Patient Details  Name: Henry [name] MRN: [id number] Date of Birth: 1934/06/07  Today's Date: 05/28/2014  Problem List:  Patient Active Problem List   Diagnosis Date Noted  . Aphasia S/P CVA 05/28/2014  . Dysphagia, pharyngoesophageal phase 05/28/2014  . Left middle cerebral artery stroke 05/26/2014  . Coarctation of aorta, recurrent, post-intervention   . Expressive aphasia   . Respiratory failure   . Acute right-sided weakness   . Stroke 05/23/2014  . S/P shoulder surgery 12/24/2012  . S/P arthroscopy of shoulder 12/16/2012  . Shoulder arthritis 12/13/2012  . Biceps tendon rupture, proximal 12/13/2012  . Partial tear of right subscapularis tendon 12/13/2012  . Hypertension 05/10/2011  . Hyperlipidemia 05/10/2011  . Barrett esophagus 05/10/2011  . Esophagitis 05/10/2011  . FOOT SPRAIN, RIGHT 07/18/2007   Past Medical History:  Past Medical History  Diagnosis Date  . Hypertension   . GERD (gastroesophageal reflux disease)   . Hypercholesteremia   . HOH (hard of hearing)    Past Surgical History:  Past Surgical History  Procedure Laterality Date  . Shoulder surgery Right     torn cartiledge  . Esophagogastroduodenoscopy  10/28/2010    Procedure: ESOPHAGOGASTRODUODENOSCOPY (EGD);  Surgeon: [name], MD;  [facility];  Service: Endoscopy;  Laterality: N/A;  10:30  . Colonoscopy    . Colonoscopy  05/17/2011    Procedure: COLONOSCOPY;  Surgeon: [name], MD;  [facility];  Service: Endoscopy;  Laterality: N/A;  830  . Resection distal clavical Right 12/13/2012    Procedure: RESECTION DISTAL CLAVICAL;  Surgeon: Carole Civil, MD;  Location: [facility];  Service: Orthopedics;  Laterality: Right;  . Shoulder arthroscopy with bicepstenotomy Right 12/13/2012    Procedure: SHOULDER ARTHROSCOPY WITH BICEPS TENOTOMY, limited shoulder debridement;  Surgeon: Carole Civil, MD;  Location: [facility];  Service:  Orthopedics;  Laterality: Right;  . Esophagogastroduodenoscopy N/A 04/24/2014    Procedure: ESOPHAGOGASTRODUODENOSCOPY (EGD);  Surgeon: [name], MD;  [facility];  Service: Endoscopy;  Laterality: N/A;  815  . Radiology with anesthesia N/A 05/23/2014    Procedure: RADIOLOGY WITH ANESTHESIA;  Surgeon: Medication Radiologist, MD;  Location: [place];  Service: Radiology;  Laterality: N/A;   Social History:  reports that he quit smoking about 45 years ago. His smoking use included Cigarettes. He has a 17.5 pack-year smoking history. He does not have any smokeless tobacco history on file. He reports that he drinks about 1.8 oz of alcohol per week. He reports that he does not use illicit drugs.  Family / Support Systems Marital Status: Married How Long?: 42 years Patient Roles: Spouse, Parent, Other (Comment) (friend) Spouse/Significant Other: Jobin Montelongo - wife - [phone] (h)  [phone] (m) Children: [name] - lives 5 minutes from pt;  another dtr lives in [place] - she is a ST Anticipated Caregiver: wife Kirill Chatterjee is retired and available 24/7 Ability/Limitations of Caregiver: Leonia Reader cannot physically lift due to back issues Caregiver Availability: 24/7 Family Dynamics: close, supportive family   Social History Preferred language: English Religion: None Education: went to [employer]: Yes Write: Yes Employment Status: Retired Date Retired/Disabled/Unemployed: 2006 Age Retired: 69 Public relations account executive Issues: none reported Guardian/Conservator: N/A   Abuse/Neglect Physical Abuse: Denies Verbal Abuse: Denies Sexual Abuse: Denies Exploitation of patient/patient's resources: Denies Self-Neglect: Denies  Emotional Status Pt's affect, behavior and adjustment status: Pt is aphasic, so it was  difficult for him to answer assessment questions.  Wife and dtr helped.  Pt was able to answer yes and no questions and did not answer  negatively when CSW asked about any sadness/depression/anxiety.  Wife reports he has seemed "like himself" to her with the exception of his not being able to fully comunicate. Recent Psychosocial Issues: none reported Psychiatric History: none reported Substance Abuse History: none reported  Patient / Family Perceptions, Expectations & Goals Pt/Family understanding of illness & functional limitations: [name] and dtr report a good understanding of pt's condition/limitations and do not have any outstanding questions for the team at this time.  Wife also feels she understands how the rehab unit operates. Premorbid pt/family roles/activities: Pt likes to go to breakfast with friends, walks 3 miles/day with a friend, and to go golfing. Anticipated changes in roles/activities/participation: Pt/family realize it will be a while before pt can get back to his usual activities, but they know he will want to when appropriate. Pt/family expectations/goals: Per pt's wife, pt would like to regain his independence.  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  She can manage while he is recovering. Patient/Family Preliminary Plans: pt will do home to his home with his wife's assistance Barriers to  Discharge: Steps Social Work Anticipated Follow Up Needs: HH/OP, Support Group Expected length of stay: 10-14 days  Clinical Impression CSW met with pt and his wife (and his dtr at the end) to introduce self and role of CSW, as well as to complete assessment.  It is difficult for pt to express himself with that aphasia, so wife helped to fill in the gaps and CSW tried to ask pt as many "yes" and "no" questions as possible.  Pt has been pretty healthy up to this point, so this caught pt and wife offguard.  Wife is coping better she said now that she can see improvement in him.  Pt's dtr is appreciative that it worked out for pt to come to [facility].  CSW explained that we would help with f/u needs closer to d/c and they expressed understanding.  They have no current questions/needs/concerns at this time.  CSW will continue to follow and assist as needed.  Amadou Katzenstein, Silvestre Mesi 05/28/2014, 2:00 PM

## 2014-05-28 NOTE — Progress Notes (Signed)
Subjective/Complaints: Aphasic, answers simple questions Audible wheezing, per RN voiding well      ROS - cannot obtain due to Aphasia Objective: Vital Signs: Blood pressure 168/66, pulse 50, temperature 97.8 F (36.6 C), temperature source Oral, resp. rate 18, weight 102.5 kg (225 lb 15.5 oz), SpO2 100 %. Dg Swallowing Func-speech Pathology  05/26/2014    Objective Swallowing Evaluation:    Patient Details  Name: AUTHOR HATLESTAD MRN: [id number] Date of Birth: [birth year]  Today's Date: 05/26/2014 Time: SLP Start Time (ACUTE ONLY): 1200-SLP Stop Time (ACUTE ONLY): 1225 SLP Time Calculation (min) (ACUTE ONLY): 25 min  Past Medical History:  Past Medical History  Diagnosis Date  . Hypertension   . GERD (gastroesophageal reflux disease)   . Hypercholesteremia   . HOH (hard of hearing)    Past Surgical History:  Past Surgical History  Procedure Laterality Date  . Shoulder surgery Right     torn cartiledge  . Esophagogastroduodenoscopy  10/28/2010    Procedure: ESOPHAGOGASTRODUODENOSCOPY (EGD);  Surgeon: [name],  MD;  [facility]: [facility];  Service: Endoscopy;  Laterality: N/A;   10:30  . Colonoscopy    . Colonoscopy  05/17/2011    Procedure: COLONOSCOPY;  Surgeon: [name], MD;  Location: [facility];  Service: Endoscopy;  Laterality: N/A;  830  . Resection distal clavical Right 12/13/2012    Procedure: RESECTION DISTAL CLAVICAL;  Surgeon: Carole Civil, MD;   Location: [facility];  Service: Orthopedics;  Laterality: Right;  . Shoulder arthroscopy with bicepstenotomy Right 12/13/2012    Procedure: SHOULDER ARTHROSCOPY WITH BICEPS TENOTOMY, limited shoulder  debridement;  Surgeon: Carole Civil, MD;  Location: [facility];   Service: Orthopedics;  Laterality: Right;  . Esophagogastroduodenoscopy N/A 04/24/2014    Procedure: ESOPHAGOGASTRODUODENOSCOPY (EGD);  Surgeon: [name],  MD;  Location: [facility];  Service: Endoscopy;  Laterality: N/A;  40   HPI:  HPI: 80 y.o. male hx of HTN,  HLD, GERD, HOH admitted with syncopal  episode, aphasia and right sided weakness. Per MD report findings  pertinent for nonvisualization of the left ICA, occluded proximally within  the neck. MRI multi small areas of acute ischemia LEFT middle cerebral  artery territory. Small vascular flow void, less likely micro hemorrhage  LEFT posterior frontal lobe without lobar hematoma. Intubated 4/9-4/11 am.  CXR low lung volumes, with airspace and interstitial opacities potentially  reflecting atelectasis and/ or consolidation. MBS recommended to fully  assess motor and sensory integrity of swallow.  No Data Recorded  Assessment / Plan / Recommendation CHL IP CLINICAL IMPRESSIONS 05/26/2014  Dysphagia Diagnosis Mild pharyngeal phase dysphagia;Mild oral phase  dysphagia;Moderate oral phase dysphagia  Clinical impression Pt exhibited mild pharyngeal sensory based dysphagia  due to decreased sensation leading to delayed swallow initiation  (valleculae). Motor components primarily intact with adequate laryngeal  elevation and epiglottic inversion preventing laryngeal penetration or  aspiration during the study. Mild intermittent tongue base residue due to  decreased pharyngeal wall/tongue base contraction. No vallecular or  pyriform sinus residue present. Unable to scan esophagus due to body  habitus. Mild-moderate oral dysphagia with right lateral sulci pocketing  without sensation and mild awareness of right leakage. Recommend Dys 1  texture and thin liquids, small sips, no straws, crush pills, full  supervision and check for right sulci pocketing.       CHL IP TREATMENT RECOMMENDATION 05/26/2014  Treatment Plan Recommendations Therapy as outlined in treatment plan below  CHL IP DIET RECOMMENDATION 05/26/2014  Diet Recommendations Dysphagia 1 (Puree);Thin liquid  Liquid Administration via Cup;No straw  Medication Administration Crushed with puree  Compensations Slow rate;Small sips/bites;Check for pocketing  Postural Changes  and/or Swallow Maneuvers Seated upright 90  degrees;Upright 30-60 min after meal     CHL IP OTHER RECOMMENDATIONS 05/26/2014  Recommended Consults (None)  Oral Care Recommendations Oral care BID  Other Recommendations (None)     CHL IP FOLLOW UP RECOMMENDATIONS 05/26/2014  Follow up Recommendations Inpatient Rehab     CHL IP FREQUENCY AND DURATION 05/26/2014  Speech Therapy Frequency (ACUTE ONLY) min 2x/week  Treatment Duration 2 weeks     Pertinent Vitals/Pain none    SLP Swallow Goals No flowsheet data found.  No flowsheet data found.    CHL IP REASON FOR REFERRAL 05/26/2014  Reason for Referral Objectively evaluate swallowing function     CHL IP ORAL PHASE 05/26/2014  Lips (None)  Tongue (None)  Mucous membranes (None)  Nutritional status (None)  Other (None)  Oxygen therapy (None)  Oral Phase Impaired  Oral - Pudding Teaspoon (None)  Oral - Pudding Cup (None)  Oral - Honey Teaspoon (None)  Oral - Honey Cup (None)  Oral - Honey Syringe (None)  Oral - Nectar Teaspoon (None)  Oral - Nectar Cup Right pocketing in lateral sulci;Right anterior bolus  loss  Oral - Nectar Straw (None)  Oral - Nectar Syringe (None)  Oral - Ice Chips (None)  Oral - Thin Teaspoon (None)  Oral - Thin Cup Right pocketing in lateral sulci;Right anterior bolus loss   Oral - Thin Straw (None)  Oral - Thin Syringe (None)  Oral - Puree (None)  Oral - Mechanical Soft (None)  Oral - Regular Weak lingual manipulation;Delayed oral transit  Oral - Multi-consistency (None)  Oral - Pill (None)  Oral Phase - Comment (None)      CHL IP PHARYNGEAL PHASE 05/26/2014  Pharyngeal Phase Impaired  Pharyngeal - Pudding Teaspoon (None)  Penetration/Aspiration details (pudding teaspoon) (None)  Pharyngeal - Pudding Cup (None)  Penetration/Aspiration details (pudding cup) (None)  Pharyngeal - Honey Teaspoon (None)  Penetration/Aspiration details (honey teaspoon) (None)  Pharyngeal - Honey Cup (None)  Penetration/Aspiration details (honey cup) (None)  Pharyngeal - Honey  Syringe (None)  Penetration/Aspiration details (honey syringe) (None)  Pharyngeal - Nectar Teaspoon Delayed swallow initiation;Premature spillage  to valleculae  Penetration/Aspiration details (nectar teaspoon) (None)  Pharyngeal - Nectar Cup Delayed swallow initiation;Premature spillage to  valleculae  Penetration/Aspiration details (nectar cup) (None)  Pharyngeal - Nectar Straw (None)  Penetration/Aspiration details (nectar straw) (None)  Pharyngeal - Nectar Syringe (None)  Penetration/Aspiration details (nectar syringe) (None)  Pharyngeal - Ice Chips (None)  Penetration/Aspiration details (ice chips) (None)  Pharyngeal - Thin Teaspoon Delayed swallow initiation;Premature spillage  to valleculae  Penetration/Aspiration details (thin teaspoon) (None)  Pharyngeal - Thin Cup Delayed swallow initiation;Premature spillage to  valleculae  Penetration/Aspiration details (thin cup) (None)  Pharyngeal - Thin Straw Delayed swallow initiation;Premature spillage to  valleculae  Penetration/Aspiration details (thin straw) (None)  Pharyngeal - Thin Syringe (None)  Penetration/Aspiration details (thin syringe') (None)  Pharyngeal - Puree Delayed swallow initiation;Premature spillage to  valleculae  Penetration/Aspiration details (puree) (None)  Pharyngeal - Mechanical Soft (None)  Penetration/Aspiration details (mechanical soft) (None)  Pharyngeal - Regular Reduced pharyngeal peristalsis  Penetration/Aspiration details (regular) (None)  Pharyngeal - Multi-consistency (None)  Penetration/Aspiration details (multi-consistency) (None)  Pharyngeal - Pill (None)  Penetration/Aspiration details (pill) (None)  Pharyngeal Comment (None)  CHL IP CERVICAL ESOPHAGEAL PHASE 05/26/2014  Cervical Esophageal Phase WFL  Pudding Teaspoon (None)  Pudding Cup (None)  Honey Teaspoon (None)  Honey Cup (None)  Honey Syringe (None)  Nectar Teaspoon (None)  Nectar Cup (None)  Nectar Straw (None)  Nectar Syringe (None)  Thin Teaspoon (None)  Thin Cup  (None)  Thin Straw (None)  Thin Syringe (None)  Cervical Esophageal Comment (None)    No flowsheet data found.         Houston Siren 05/26/2014, 2:00 PM   Orbie Pyo Colvin Caroli.Ed CCC-SLP Pager [phone]      Results for orders placed or performed during the hospital encounter of 05/26/14 (from the past 72 hour(s))  CBC WITH DIFFERENTIAL     Status: Abnormal   Collection Time: 05/27/14  7:20 AM  Result Value Ref Range   WBC 9.7 4.0 - 10.5 K/uL   RBC 4.14 (L) 4.22 - 5.81 MIL/uL   Hemoglobin 11.7 (L) 13.0 - 17.0 g/dL   HCT 34.9 (L) 39.0 - 52.0 %   MCV 84.3 78.0 - 100.0 fL   MCH 28.3 26.0 - 34.0 pg   MCHC 33.5 30.0 - 36.0 g/dL   RDW 13.7 11.5 - 15.5 %   Platelets 198 150 - 400 K/uL   Neutrophils Relative % 69 43 - 77 %   Neutro Abs 6.7 1.7 - 7.7 K/uL   Lymphocytes Relative 20 12 - 46 %   Lymphs Abs 1.9 0.7 - 4.0 K/uL   Monocytes Relative 10 3 - 12 %   Monocytes Absolute 1.0 0.1 - 1.0 K/uL   Eosinophils Relative 1 0 - 5 %   Eosinophils Absolute 0.1 0.0 - 0.7 K/uL   Basophils Relative 0 0 - 1 %   Basophils Absolute 0.0 0.0 - 0.1 K/uL  Comprehensive metabolic panel     Status: Abnormal   Collection Time: 05/27/14  7:20 AM  Result Value Ref Range   Sodium 142 135 - 145 mmol/L   Potassium 3.6 3.5 - 5.1 mmol/L   Chloride 111 96 - 112 mmol/L   CO2 19 19 - 32 mmol/L   Glucose, Bld 105 (H) 70 - 99 mg/dL   BUN 22 6 - 23 mg/dL   Creatinine, Ser 0.81 0.50 - 1.35 mg/dL   Calcium 8.4 8.4 - 10.5 mg/dL   Total Protein 6.0 6.0 - 8.3 g/dL   Albumin 2.8 (L) 3.5 - 5.2 g/dL   AST 25 0 - 37 U/L   ALT 22 0 - 53 U/L   Alkaline Phosphatase 42 39 - 117 U/L   Total Bilirubin 1.0 0.3 - 1.2 mg/dL   GFR calc non Af Amer 84 (L) >90 mL/min   GFR calc Af Amer >90 >90 mL/min    Comment: (NOTE) The eGFR has been calculated using the CKD EPI equation. This calculation has not been validated in all clinical situations. eGFR's persistently <90 mL/min signify possible Chronic Kidney Disease.    Anion  gap 12 5 - 15  Glucose, capillary     Status: None   Collection Time: 05/27/14 11:47 AM  Result Value Ref Range   Glucose-Capillary 94 70 - 99 mg/dL  Platelet inhibition p2y12     Status: Abnormal   Collection Time: 05/28/14  6:53 AM  Result Value Ref Range   Platelet Function  P2Y12 113 (L) 194 - 418 PRU    Comment:        The literature has shown a direct correlation of PRU  values over 230 with higher risks of thrombotic events.  Lower PRU values are associated with platelet inhibition.      HEENT: normal Cardio: RRR and no murmur Resp: CTA B/L GI: BS positive, Distention and no tenderness Extremity:  Edema pedal Skin:   Bruise multiple UE Neuro: Alert/Oriented, Abnormal Sensory delayed response to UE pinch, Abnormal Motor 4/5 in the right deltoid, biceps, triceps, grip, hip flexor, knee extensor and ankle dorsiflexor, Aphasic and Apraxic Musc/Skel:  Other no pain with upper extremity or lower extremity active range of motion Gen. no acute distress   Assessment/Plan: 1. Functional deficits secondary to left MCA distribution infarct with right hemiparesis and aphasia as well as dysphagia  which require 3+ hours per day of interdisciplinary therapy in a comprehensive inpatient rehab setting. Physiatrist is providing close team supervision and 24 hour management of active medical problems listed below. Physiatrist and rehab team continue to assess barriers to discharge/monitor patient progress toward functional and medical goals. FIM: FIM - Bathing Bathing Steps Patient Completed: Chest, Right Arm, Left Arm, Abdomen, Front perineal area, Buttocks, Right upper leg, Left upper leg Bathing: 4: Min-Patient completes 8-9 54f10 parts or 75+ percent  FIM - Upper Body Dressing/Undressing Upper body dressing/undressing steps patient completed: Thread/unthread right sleeve of pullover shirt/dresss, Thread/unthread left sleeve of pullover shirt/dress, Put head through opening of pull over  shirt/dress Upper body dressing/undressing: 4: Min-Patient completed 75 plus % of tasks FIM - Lower Body Dressing/Undressing Lower body dressing/undressing steps patient completed: Don/Doff left shoe, Fasten/unfasten right shoe, Fasten/unfasten left shoe Lower body dressing/undressing: 2: Max-Patient completed 25-49% of tasks  FIM - Toileting Toileting: 2: Max-Patient completed 1 of 3 steps  FIM - TRadio producerDevices: Grab bars Toilet Transfers: 4-To toilet/BSC: Min A (steadying Pt. > 75%), 4-From toilet/BSC: Min A (steadying Pt. > 75%)  FIM - Bed/Chair Transfer Bed/Chair Transfer: 4: Bed > Chair or W/C: Min A (steadying Pt. > 75%), 4: Chair or W/C > Bed: Min A (steadying Pt. > 75%)  FIM - Locomotion: Ambulation Ambulation/Gait Assistance: 1: +2 Total assist (for safety; pt performed 75%)  Comprehension Comprehension Mode: Auditory Comprehension: 2-Understands basic 25 - 49% of the time/requires cueing 51 - 75% of the time  Expression Expression Mode: Verbal Expression: 1-Expresses basis less than 25% of the time/requires cueing greater than 75% of the time.  Social Interaction Social Interaction: 2-Interacts appropriately 25 - 49% of time - Needs frequent redirection.  Problem Solving Problem Solving: 2-Solves basic 25 - 49% of the time - needs direction more than half the time to initiate, plan or complete simple activities  Memory Memory: 2-Recognizes or recalls 25 - 49% of the time/requires cueing 51 - 75% of the time  Medical Problem List and Plan: 1. Functional deficits secondary to left MCA territory infarct with ICA occlusion status post mechanical thrombectomy with left ICA stenting, embolic secondary to large vessel atherosclerosis 2.  DVT Prophylaxis/Anticoagulation: Subcutaneous Lovenox. Monitor platelet counts and any signs of bleeding. Venous Doppler studies 05/25/2014 negative 3. Pain Management: Tylenol as needed 4. Dysphagia.  Dysphagia 1 thin liquid diet. Follow-up speech therapy 5. Neuropsych: This patient is capable of making decisions on his own behalf. 6. Skin/Wound Care: Routine skin checks 7. Fluids/Electrolytes/Nutrition: Strict I&O's with follow-up chemistries 8. Hyperlipidemia. Lipitor 9. Hypertension. Allowing for permissive hypertension. No present antihypertensive medication. Patient on Norvasc 5 mg daily, hydrochlorothiazide 25 mg daily prior to admission. Resume as tolerated 10. GERD. Protonix   LOS (Days) 2  A FACE TO FACE EVALUATION WAS PERFORMED  KIRSTEINS,ANDREW E 05/28/2014, 7:50 AM

## 2014-05-28 NOTE — Progress Notes (Signed)
[facility] Individual Statement of Services  Patient Name:  Henry [name]  Date:  05/28/2014  Welcome to the [facility].  Our goal is to provide you with an individualized program based on your diagnosis and situation, designed to meet your specific needs.  With this comprehensive rehabilitation program, you will be expected to participate in at least 3 hours of rehabilitation therapies Monday-Friday, with modified therapy programming on the weekends.  Your rehabilitation program will include the following services:  Physical Therapy (PT), Occupational Therapy (OT), Speech Therapy (ST), 24 hour per day rehabilitation nursing, Case Management (Social Worker), Rehabilitation Medicine, Nutrition Services and Pharmacy Services  Weekly team conferences will be held on Wednesdays to discuss your progress.  Your Social Worker will talk with you frequently to get your input and to update you on team discussions.  Team conferences with you and your family in attendance may also be held.  Expected length of stay:  10 to 14 days  Overall anticipated outcome:  Supervision  Depending on your progress and recovery, your program may change. Your Social Worker will coordinate services and will keep you informed of any changes. Your Social Worker's name and contact numbers are listed  below.  The following services may also be recommended but are not provided by the [facility] will be made to provide these services after discharge if needed.  Arrangements include referral to agencies that provide these services.  Your insurance has been verified to be:  Medicare and Tricare Your primary doctor is:  Dr. Marjean Donna  Pertinent information will be shared with your doctor and your insurance company.  Social Worker:  Alfonse Alpers,  LCSW  [phone] or ([phone]  Information discussed with and copy given to patient by: Trey Sailors, 05/28/2014, 2:05 PM

## 2014-05-28 NOTE — Progress Notes (Signed)
Physical Therapy Session Note  Patient Details  Name: Henry [name] MRN: [id number] Date of Birth: 10-18-1934  Today's Date: 05/28/2014 PT Individual Time:  -      Short Term Goals: Week 1:  PT Short Term Goal 1 (Week 1): pt will perform all bed mobility with supervision PT Short Term Goal 2 (Week 1): pt will transfer safely with min assist of 1 person 5/5 trials PT Short Term Goal 3 (Week 1): pt will perform gait x 150' with LRAD with min guard assist PT Short Term Goal 4 (Week 1): pt will ascend/descend 5 stairs 2 rails safely wtih min assist PT Short Term Goal 5 (Week 1): pt will route find gym>< room with max multi-modal cues  Skilled Therapeutic Interventions/Progress Updates: no pain reported AM or PM.  Activity tolerance on NuStep at level 4 x 3 minutes, 4 extremities, x 3 minutes bil LEs only.   Up/down set of 5 steps x 2, bil rails, min guard assist, step through method ascending and descending.  neuromuscular re-education via forced use : -sit>< stand without use of UEs -static> dynamic standing on compliant floor mat - Gait with hand hold assistance on level tile and carpet with min assist.  Pt returned to nurse's station, resting in w/c with quick release belt on.    tx 2:  neuromuscular re-education for w/c propulsion using bil LEs, standing biased toward R during bil UE activity; alternating step/taps onto 6" high step; dynamic standing balance reaching with L and R hand overhead to target; gait pushing w/c from behind as AD, x 150' with close supervision.    Sit>< stand without use of UEs x 5 blocked practice.  Pt resting in w/c at nurse's station, quick release belt applied. Therapy Documentation Precautions:  Precautions Precautions: Fall Precaution Comments: h/o dizziness in standing per family (?vestibular versus orthostatic issues) Restrictions Weight Bearing Restrictions: No     See FIM for current functional status  Therapy/Group: Individual  Therapy  [name] 05/28/2014, 7:55 AM

## 2014-05-28 NOTE — Progress Notes (Signed)
Occupational Therapy Session Note  Patient Details  Name: Henry [name] MRN: [id number] Date of Birth: May 27, 1934  Today's Date: 05/28/2014 OT Individual Time: 0017-4944 OT Individual Time Calculation (min): 60 min    Short Term Goals: Week 1:  OT Short Term Goal 1 (Week 1): Pt will complete bathing with supervision with <25% cues OT Short Term Goal 2 (Week 1): Pt will complete LB dressing with min assist OT Short Term Goal 3 (Week 1): Pt will complete grooming in standing with correct identification of objects with <25% cues OT Short Term Goal 4 (Week 1): Pt will complete toilet transfers with supervision  Skilled Therapeutic Interventions/Progress Updates:    1:1 self care retraining with focus on functional ambulation around room without AD, toilet transfers/ toileting, functional problem solving, ability to follow 2 step directions, functional communication at the word level and some automatic social phrases, shower transfer with HHA, activity tolerance/ endurance and standing tolerance, path finding, right visual/ environmental attention with functional ambulation around unit to get a drink. Pt with increased labored breathing during tasks- O2 monitored and remained above 95% throughout session on RA. Offered rest breaks as needed. Pt transitioned into recliner and left at RN station in between therapies for safety.   Therapy Documentation Precautions:  Precautions Precautions: Fall Precaution Comments: h/o dizziness in standing per family (?vestibular versus orthostatic issues), wife stated he has vertigo occasionally Restrictions Weight Bearing Restrictions: No Pain: Pain Assessment Pain Assessment: No/denies pain  See FIM for current functional status  Therapy/Group: Individual Therapy  [name] [facility] 05/28/2014, 1:44 PM

## 2014-05-28 NOTE — IPOC Note (Signed)
Overall Plan of Care [facility]) Patient Details Name: Henry [name] MRN: [id number] DOB: 78/18/1936  Admitting Diagnosis: L CVA  Hospital Problems: Active Problems:   Left middle cerebral artery stroke   Aphasia S/P CVA   Dysphagia, pharyngoesophageal phase     Functional Problem List: Nursing Behavior, Bladder, Bowel, Medication Management, Perception, Safety, Skin Integrity, Sensory  PT Behavior, Balance, Edema, Endurance, Motor, Safety  OT Balance, Cognition, Endurance, Motor, Safety  SLP Cognition, Linguistic, Nutrition, Safety  TR         Basic ADL's: OT Grooming, Bathing, Dressing, Toileting, Eating     Advanced  ADL's: OT       Transfers: PT Bed Mobility, Bed to Chair, Car, Manufacturing systems engineer, Metallurgist: PT Ambulation, Data processing manager, Emergency planning/management officer     Additional Impairments: OT Fuctional Use of Upper Extremity  SLP Swallowing, Communication, Social Cognition comprehension, expression Problem Solving, Memory, Attention, Awareness  TR      Anticipated Outcomes Item Anticipated Outcome  Self Feeding Mod I  Swallowing  Supervision with least restrictive diet    Basic self-care  Supervision  Toileting  Supervision   Bathroom Transfers Supervision  Bowel/Bladder  To be continent of Bladder and bowel  Transfers  modified independent physically for basic, supervision car  Locomotion  supervision x 150' and up/down 12 steps min assist  Communication  mod assist for multimodal communication  Cognition  min assist for basic   Pain  Pain <3  Safety/Judgment  Min A   Therapy Plan: PT Intensity: Minimum of 1-2 x/day ,45 to 90 minutes PT Frequency: 5 out of 7 days OT Intensity: Minimum of 1-2 x/day, 45 to 90 minutes OT Frequency: 5 out of 7 days OT Duration/Estimated Length of Stay: 10-14 days SLP Intensity: Minumum of 1-2 x/day, 30 to 90 minutes SLP Frequency: 3 to 5 out of 7 days SLP Duration/Estimated Length of Stay: 10-12 days         Team Interventions: Nursing Interventions Patient/Family Education, Bladder Management, Bowel Management, Disease Management/Prevention, Pain Management, Discharge Planning, Dysphagia/Aspiration Precaution Training, Cognitive Remediation/Compensation, Skin Care/Wound Management, Medication Management  PT interventions Ambulation/gait training, Balance/vestibular training, Cognitive remediation/compensation, Discharge planning, Community reintegration, DME/adaptive equipment instruction, Functional mobility training, Patient/family education, Neuromuscular re-education, Psychosocial support, Splinting/orthotics, Therapeutic Exercise, Therapeutic Activities, Stair training, UE/LE Strength taining/ROM, UE/LE Coordination activities, Wheelchair propulsion/positioning  OT Interventions Training and development officer, Cognitive remediation/compensation, Academic librarian, Discharge planning, Disease mangement/prevention, Engineer, drilling, Functional mobility training, Neuromuscular re-education, Patient/family education, Psychosocial support, Self Care/advanced ADL retraining, Therapeutic Activities, Therapeutic Exercise, UE/LE Strength taining/ROM, UE/LE Coordination activities  SLP Interventions Cognitive remediation/compensation, Cueing hierarchy, Dysphagia/aspiration precaution training, Functional tasks, Patient/family education, Oral motor exercises, Internal/external aids, Environmental controls  TR Interventions    SW/CM Interventions Discharge Planning, Psychosocial Support, Patient/Family Education    Team Discharge Planning: Destination: PT-Home ,OT- Home , SLP-Home Projected Follow-up: PT-Home health PT, OT-  Outpatient OT, 24 hour supervision/assistance, SLP-Home Health SLP, Outpatient SLP, 24 hour supervision/assistance Projected Equipment Needs: PT-To be determined, OT- Tub/shower bench, SLP-None recommended by SLP Equipment Details: PT- , OT-  Patient/family  involved in discharge planning: PT- Patient,  OT-Family member/caregiver, SLP-Patient unable/family or caregive not available  MD ELOS: 11-16d Medical Rehab Prognosis:  Good Assessment: 80 y.o.right handed male with history of hypertension, hyperlipidemia. Independent prior to admission living with his wife. Presented 05/23/2014 to [facility] with syncopal episode with findings of aphasia as well as right-sided weakness. Patient did not receive TPA. Initial cranial CT  scan showed no acute intracranial abnormality. CT angio of the head showed nonvisualization of the left trocar artery, occluded proximally within the neck. Patient was transferred to [facility] for ongoing care. CT cerebral perfusion scan showed areas of ischemic core infarct within the left MCA territory. Echocardiogram with ejection fraction of 81% grade 1 diastolic dysfunction. Venous Doppler study with no signs of DVT. Carotid arteriogram completed followed by revascularization of acutely occluded left ICA proximal with stent assisted angioplasty per interventional radiology 05/23/2014. Patient initially maintained on ventilator followed by critical care medicine for a short time. Follow-up MRI of the brain 05/24/2014 showing multiple small areas of acute ischemic left middle cerebral artery territory. Neurology follow-up currently maintained on aspirin and Plavix    Now requiring 24/7 Rehab RN,MD, as well as CIR level PT, OT and SLP.  Treatment team will focus on ADLs and mobility with goals set at Sup  See Team Conference Notes for weekly updates to the plan of care

## 2014-05-29 ENCOUNTER — Inpatient Hospital Stay (HOSPITAL_COMMUNITY): Payer: Medicare Other | Admitting: Speech Pathology

## 2014-05-29 ENCOUNTER — Inpatient Hospital Stay (HOSPITAL_COMMUNITY): Payer: Medicare Other | Admitting: *Deleted

## 2014-05-29 ENCOUNTER — Inpatient Hospital Stay (HOSPITAL_COMMUNITY): Payer: Medicare Other | Admitting: Occupational Therapy

## 2014-05-29 DIAGNOSIS — R1314 Dysphagia, pharyngoesophageal phase: Secondary | ICD-10-CM

## 2014-05-29 MED ORDER — TRAZODONE HCL 50 MG PO TABS: 50.0000 mg | ORAL_TABLET | Freq: Every day | ORAL | Status: DC

## 2014-05-29 MED ORDER — TRAZODONE HCL 50 MG PO TABS: 50.0000 mg | ORAL_TABLET | Freq: Every evening | ORAL | Status: DC | PRN

## 2014-05-29 MED ORDER — AMLODIPINE BESYLATE 5 MG PO TABS
5.0000 mg | ORAL_TABLET | Freq: Every day | ORAL | Status: DC
  Administered 2014-05-29 – 2014-06-05 (×8): 5 mg via ORAL
  Filled 2014-05-29 (×9): qty 1

## 2014-05-29 MED ORDER — CIPROFLOXACIN HCL 0.3 % OP SOLN
1.0000 [drp] | OPHTHALMIC | Status: AC
  Administered 2014-05-29 – 2014-06-03 (×21): 1 [drp] via OPHTHALMIC
  Filled 2014-05-29: qty 2.5

## 2014-05-29 MED ORDER — TRAZODONE HCL 50 MG PO TABS: 50.0000 mg | ORAL_TABLET | Freq: Four times a day (QID) | ORAL | Status: DC | PRN

## 2014-05-29 NOTE — Progress Notes (Signed)
Physical Therapy Session Note  Patient Details  Name: Henry [name] MRN: [id number] Date of Birth: 09-08-1934  Today's Date: 05/29/2014 PT Individual Time: 14:15-15:35 (71min)    Short Term Goals: Week 1:  PT Short Term Goal 1 (Week 1): pt will perform all bed mobility with supervision PT Short Term Goal 2 (Week 1): pt will transfer safely with min assist of 1 person 5/5 trials PT Short Term Goal 3 (Week 1): pt will perform gait x 150' with LRAD with min guard assist PT Short Term Goal 4 (Week 1): pt will ascend/descend 5 stairs 2 rails safely wtih min assist PT Short Term Goal 5 (Week 1): pt will route find gym>< room with max multi-modal cues  Skilled Therapeutic Interventions/Progress Updates:  Tx focused on functional mobility training, gait without device, and NMR via forced use, manual facilitation, and multi-modal cues for balance training and fall risk reduction. Family trained to assist pt to bathroom, safety plan updated. VSS throughout. Pt/family educated on CVA risk reduction, safety with mobility, and safe guarding.   Pt performed multiple sit<>stand transfers throughout tx in varying conditions with/without UE support with S/Min A depending on level of complexity. Pt performed 5x sit<>stands in 19 seconds without UEs. Toilet transfer with min-guard A and safety cues.   Pt performed gait in controlled setting x150' with S, noted to have decreased R foot clearance in swing, unable to adjust with cues. Pt challenged with high level gait including step-overs, step-ups, and quick stops and turns. Pt needed up to min A in challenging situations.   Stair training x12 with bil rails and up to Min-guard A during descent, reciprocal pattern.   NMR via:  - Berg balance test administered, and family educated on functional implications, indicating significant risk of falls.  - Nustep x8 min level 4 with bil UEs and LEs for increased timing and coordination, activity tolerance.  -  Perturbations via ball toss on firm and foam x24min each with posterior LOB on foam x1, min A to recover, - gait with ball toss forward/backwards x30' each  - step-over pole with RLE 2x20 and LLE x20 to increase weight shifting and RLE clearance. Pt continues to have difficulty clearing RLE in swing.   Family able to safely return-demonstrate ambulation <> bathroom without device and min-guard A, guarding on R side and providing safety cues prn.   Pt left up in recliner with all needs in reach, family present.      Therapy Documentation Precautions:  Precautions Precautions: Fall Precaution Comments: h/o dizziness in standing per family (?vestibular versus orthostatic issues), wife stated he has vertigo occasionally Restrictions Weight Bearing Restrictions: No General:   Vital Signs: Therapy Vitals Temp: 97.9 F (36.6 C) Temp Source: Oral Pulse Rate: (!) 58 Resp: 20 BP: (!) 158/62 mmHg Patient Position (if appropriate): Sitting Oxygen Therapy SpO2: 99 % O2 Device: Not Delivered Pain: Pain Assessment Pain Assessment: No/denies pain Mobility:   Locomotion :    Trunk/Postural Assessment :    Balance: Standardized Balance Assessment Standardized Balance Assessment: Berg Balance Test Berg Balance Test Sit to Stand: Able to stand  independently using hands Standing Unsupported: Able to stand 2 minutes with supervision Sitting with Back Unsupported but Feet Supported on Floor or Stool: Able to sit safely and securely 2 minutes Stand to Sit: Controls descent by using hands Transfers: Able to transfer safely, definite need of hands Standing Unsupported with Eyes Closed: Able to stand 10 seconds with supervision Standing Ubsupported with Feet Together:  Able to place feet together independently but unable to hold for 30 seconds From Standing, Reach Forward with Outstretched Arm: Can reach forward >12 cm safely (5") From Standing Position, Pick up Object from Floor: Able to pick  up shoe, needs supervision From Standing Position, Turn to Look Behind Over each Shoulder: Looks behind from both sides and weight shifts well Turn 360 Degrees: Able to turn 360 degrees safely one side only in 4 seconds or less Standing Unsupported, Alternately Place Feet on Step/Stool: Able to complete 4 steps without aid or supervision Standing Unsupported, One Foot in Front: Able to take small step independently and hold 30 seconds Standing on One Leg: Able to lift leg independently and hold equal to or more than 3 seconds Total Score: 40  See FIM for current functional status  Therapy/Group: Individual Therapy  Henry [name], PT, DPT   Henry [name], [place] 05/29/2014, 2:58 PM

## 2014-05-29 NOTE — Progress Notes (Signed)
Speech Language Pathology Daily Session Note  Patient Details  Name: Henry [name] MRN: [id number] Date of Birth: 27-Dec-1934  Today's Date: 05/29/2014 SLP Individual Time: 7672-0947; [id number] SLP Individual Time Calculation (min): 17 min, 45 min   Short Term Goals: Week 1: SLP Short Term Goal 1 (Week 1): Pt will use multimodal communication to convey immediate needs/wants with max verbal and visual cues  SLP Short Term Goal 2 (Week 1): Pt will follow 2 step commands during basic, familiar ADLs/home management tasks with max assist verbal, visual, and demonstration cues.   SLP Short Term Goal 3 (Week 1): Pt will answer immediate, biographical, and/or environmental yes/no questions accurately with max assist verbal and visual cues.   SLP Short Term Goal 4 (Week 1): Pt will sustain attention to a basic familiar home mangement task for 3-5 minute intervals with mod cues verbal, visual, and tactile cues for redirection.   SLP Short Term Goal 5 (Week 1): Pt will consume dys 1 textures and thin liquids with min verbal and visual cues for use of rate and portion control as well as monitoring and correcting right sided buccal residue over 3 consecutive sessions to demonstrate readiness for diet progression.  SLP Short Term Goal 6 (Week 1): Pt will return demonstration of 2 safety precautions during basic functional tasks with max assist verbal,visual, and tactile cues.    Skilled Therapeutic Interventions:  Session 1:  Pt was seen for skilled ST targeting cognitive goals.  Upon arrival, pt was seated upright in recliner with quick release belt donned.  SLP facilitated the session with a basic, familiar, self care task targeting sequencing and basic functional problem solving.  Pt brushed his teeth with min verbal and demonstration cues for initiation of task and following multi-step commands, mod assist verbal cues to ask for assistance when opening containers and applying toothpaste to toothbrush.  Pt  was noted with overall appropriate use of objects during very basic tasks.  Minimal diffuse residue was removed from the oral cavity from breakfast.  Nurse tech reports good toleration of dys 2 upgrade.  Will follow up for diet toleration at lunch today.  Continue per current plan of care.   Session 2: Pt was seen for skilled ST targeting dysphagia goals.  Upon arrival, pt was seated upright in wheelchair, awake, alert, and agreeable to participate in [place].  SLP facilitated the session with skilled observations during presentations of pt's currently prescribed diet.  Pt was noted with a mild-moderate amount of right sided buccal residue left in the oral cavity post swallow resulting from oral motor impairments which cleared with min verbal cues for use of liquid wash.  No overt s/s of aspiration were noted with thin liquids or dys 2 textures.   Pt's daughter, Henry [name], was present for the duration of today's therapy session; therefore, SLP initiated skilled education related to pt's currently recommended diet and swallowing precautions.  Handout was provided to facilitate carryover in between therapy sessions.  SLP also provided skilled education regarding pt's realistic prognostic expectations for return to a regular diet given progress made in therapy thus far.  Pt's daughter is now signed off to supervise during meals.  Continue per current plan of care.    FIM:  Comprehension Comprehension: 4-Understands basic 75 - 89% of the time/requires cueing 10 - 24% of the time Expression Expression Mode: Verbal Expression: 3-Expresses basic 50 - 74% of the time/requires cueing 25 - 50% of the time. Needs to repeat parts of sentences.  Social Interaction Social Interaction: 3-Interacts appropriately 50 - 74% of the time - May be physically or verbally inappropriate. Problem Solving Problem Solving: 3-Solves basic 50 - 74% of the time/requires cueing 25 - 49% of the time Memory Memory: 2-Recognizes or recalls 25  - 49% of the time/requires cueing 51 - 75% of the time  Pain Pain Assessment Pain Assessment: No/denies pain Pain Score: 0-No pain  Therapy/Group: Individual Therapy  Henry [name], Henry [name] 05/29/2014, 12:36 PM

## 2014-05-29 NOTE — Progress Notes (Addendum)
Subjective/Complaints: Aphasic,no apraxia noted per SLP during ADLs, answers simple questions, non fluent Audible wheezing, per RN voiding well      ROS - cannot obtain due to Aphasia Objective: Vital Signs: Blood pressure 155/72, pulse 65, temperature 98.5 F (36.9 C), temperature source Oral, resp. rate 22, weight 102.5 kg (225 lb 15.5 oz), SpO2 98 %. No results found. Results for orders placed or performed during the hospital encounter of 05/26/14 (from the past 72 hour(s))  CBC WITH DIFFERENTIAL     Status: Abnormal   Collection Time: 05/27/14  7:20 AM  Result Value Ref Range   WBC 9.7 4.0 - 10.5 K/uL   RBC 4.14 (L) 4.22 - 5.81 MIL/uL   Hemoglobin 11.7 (L) 13.0 - 17.0 g/dL   HCT 34.9 (L) 39.0 - 52.0 %   MCV 84.3 78.0 - 100.0 fL   MCH 28.3 26.0 - 34.0 pg   MCHC 33.5 30.0 - 36.0 g/dL   RDW 13.7 11.5 - 15.5 %   Platelets 198 150 - 400 K/uL   Neutrophils Relative % 69 43 - 77 %   Neutro Abs 6.7 1.7 - 7.7 K/uL   Lymphocytes Relative 20 12 - 46 %   Lymphs Abs 1.9 0.7 - 4.0 K/uL   Monocytes Relative 10 3 - 12 %   Monocytes Absolute 1.0 0.1 - 1.0 K/uL   Eosinophils Relative 1 0 - 5 %   Eosinophils Absolute 0.1 0.0 - 0.7 K/uL   Basophils Relative 0 0 - 1 %   Basophils Absolute 0.0 0.0 - 0.1 K/uL  Comprehensive metabolic panel     Status: Abnormal   Collection Time: 05/27/14  7:20 AM  Result Value Ref Range   Sodium 142 135 - 145 mmol/L   Potassium 3.6 3.5 - 5.1 mmol/L   Chloride 111 96 - 112 mmol/L   CO2 19 19 - 32 mmol/L   Glucose, Bld 105 (H) 70 - 99 mg/dL   BUN 22 6 - 23 mg/dL   Creatinine, Ser 0.81 0.50 - 1.35 mg/dL   Calcium 8.4 8.4 - 10.5 mg/dL   Total Protein 6.0 6.0 - 8.3 g/dL   Albumin 2.8 (L) 3.5 - 5.2 g/dL   AST 25 0 - 37 U/L   ALT 22 0 - 53 U/L   Alkaline Phosphatase 42 39 - 117 U/L   Total Bilirubin 1.0 0.3 - 1.2 mg/dL   GFR calc non Af Amer 84 (L) >90 mL/min   GFR calc Af Amer >90 >90 mL/min    Comment: (NOTE) The eGFR has been calculated using the CKD  EPI equation. This calculation has not been validated in all clinical situations. eGFR's persistently <90 mL/min signify possible Chronic Kidney Disease.    Anion gap 12 5 - 15  Glucose, capillary     Status: None   Collection Time: 05/27/14 11:47 AM  Result Value Ref Range   Glucose-Capillary 94 70 - 99 mg/dL  Platelet inhibition p2y12     Status: Abnormal   Collection Time: 05/28/14  6:53 AM  Result Value Ref Range   Platelet Function  P2Y12 113 (L) 194 - 418 PRU    Comment:        The literature has shown a direct correlation of PRU values over 230 with higher risks of thrombotic events.  Lower PRU values are associated with platelet inhibition.      HEENT: normal Cardio: RRR and no murmur Resp: CTA B/L GI: BS positive, Distention and no tenderness Extremity:  Edema pedal Skin:   Bruise multiple UE Neuro: Alert/Oriented, , Abnormal Motor 4/5 in the right deltoid, biceps, triceps, grip, hip flexor, knee extensor and ankle dorsiflexor, Aphasic and Apraxic Musc/Skel:  Other no pain with upper extremity or lower extremity active range of motion Gen. no acute distress Standing balance fair  Assessment/Plan: 1. Functional deficits secondary to left MCA distribution infarct with right hemiparesis and aphasia as well as dysphagia  which require 3+ hours per day of interdisciplinary therapy in a comprehensive inpatient rehab setting. Physiatrist is providing close team supervision and 24 hour management of active medical problems listed below. Physiatrist and rehab team continue to assess barriers to discharge/monitor patient progress toward functional and medical goals. FIM: FIM - Bathing Bathing Steps Patient Completed: Chest, Right Arm, Left Arm, Abdomen, Front perineal area, Buttocks, Right upper leg, Left upper leg Bathing: 4: Min-Patient completes 8-9 4f10 parts or 75+ percent  FIM - Upper Body Dressing/Undressing Upper body dressing/undressing steps patient completed:  Thread/unthread right sleeve of pullover shirt/dresss, Thread/unthread left sleeve of pullover shirt/dress, Put head through opening of pull over shirt/dress Upper body dressing/undressing: 4: Min-Patient completed 75 plus % of tasks FIM - Lower Body Dressing/Undressing Lower body dressing/undressing steps patient completed: Don/Doff left shoe, Fasten/unfasten right shoe, Fasten/unfasten left shoe Lower body dressing/undressing: 2: Max-Patient completed 25-49% of tasks  FIM - Toileting Toileting steps completed by patient: Performs perineal hygiene, Adjust clothing after toileting, Adjust clothing prior to toileting Toileting Assistive Devices: Grab bar or rail for support Toileting: 4: Steadying assist  FIM - TRadio producerDevices: Grab bars Toilet Transfers: 4-To toilet/BSC: Min A (steadying Pt. > 75%), 4-From toilet/BSC: Min A (steadying Pt. > 75%)  FIM - Bed/Chair Transfer Bed/Chair Transfer: 4: Bed > Chair or W/C: Min A (steadying Pt. > 75%), 4: Chair or W/C > Bed: Min A (steadying Pt. > 75%)  FIM - Locomotion: Ambulation Ambulation/Gait Assistance: 1: +2 Total assist (for safety; pt performed 75%)  Comprehension Comprehension Mode: Auditory Comprehension: 4-Understands basic 75 - 89% of the time/requires cueing 10 - 24% of the time  Expression Expression Mode: Verbal Expression: 2-Expresses basic 25 - 49% of the time/requires cueing 50 - 75% of the time. Uses single words/gestures.  Social Interaction Social Interaction: 3-Interacts appropriately 50 - 74% of the time - May be physically or verbally inappropriate.  Problem Solving Problem Solving: 3-Solves basic 50 - 74% of the time/requires cueing 25 - 49% of the time  Memory Memory: 2-Recognizes or recalls 25 - 49% of the time/requires cueing 51 - 75% of the time  Medical Problem List and Plan: 1. Functional deficits secondary to left MCA territory infarct with ICA occlusion status post  mechanical thrombectomy with left ICA stenting, embolic secondary to large vessel atherosclerosis 2.  DVT Prophylaxis/Anticoagulation: Subcutaneous Lovenox. Monitor platelet counts and any signs of bleeding. Venous Doppler studies 05/25/2014 negative 3. Pain Management: Tylenol as needed 4. Dysphagia. Dysphagia 1 thin liquid diet. Follow-up speech therapy 5. Neuropsych: This patient is capable of making decisions on his own behalf. 6. Skin/Wound Care: Routine skin checks 7. Fluids/Electrolytes/Nutrition: Strict I&O's with follow-up chemistries 8. Hyperlipidemia. Lipitor 9. Hypertension. Allowing for permissive hypertension. No present antihypertensive medication. Patient on Norvasc 5 mg daily, hydrochlorothiazide 25 mg daily prior to admission. Resume Norvasc as Systolic BP creeping up 10. GERD. Protonix   LOS (Days) 3 A FACE TO FACE EVALUATION WAS PERFORMED  Sharia Averitt E 05/29/2014, 8:16 AM

## 2014-05-29 NOTE — Progress Notes (Signed)
Occupational Therapy Session Note  Patient Details  Name: Henry [name] MRN: [id number] Date of Birth: 1934-07-21  Today's Date: 05/29/2014 OT Individual Time: 0930-1015 OT Individual Time Calculation (min): 45 min    Short Term Goals: Week 1:  OT Short Term Goal 1 (Week 1): Pt will complete bathing with supervision with <25% cues OT Short Term Goal 2 (Week 1): Pt will complete LB dressing with min assist OT Short Term Goal 3 (Week 1): Pt will complete grooming in standing with correct identification of objects with <25% cues OT Short Term Goal 4 (Week 1): Pt will complete toilet transfers with supervision  Skilled Therapeutic Interventions/Progress Updates:    1:1 PT reports more fatigue and not feeling as well today as he did yesterday. Pt did engage in self care retraining including bathing at shower level sit to stand and obtaining his own clothes for dressing. Pt with increased ability to communicate at 2-3 word level today with extra time. Increased ability to share his wants and needs. Pt required cuing for selecting proper grooming tool (tried to put deodorant on with shampoo bottle). Pt required cuing for attention to the right visual field however did required less cuing for use of right UE in functional tasks. Pt requested to talk to doctor about his medicine and request for more quieter environment tonight. Pt requested to lay down and was left laying down; alerted RN of questions he wanted answered. Pt continued to lack full insight into safety of not getting up without assistance- requiring mod cuing for working memory.  Therapy Documentation Precautions:  Precautions Precautions: Fall Precaution Comments: h/o dizziness in standing per family (?vestibular versus orthostatic issues), wife stated he has vertigo occasionally Restrictions Weight Bearing Restrictions: No General: General OT Amount of Missed Time: 15 Minutes due to fatigue due to not sleeping well the night  before Pain: Pain Assessment Pain Assessment: No/denies pain  See FIM for current functional status  Therapy/Group: Individual Therapy  [name] [facility] 05/29/2014, 2:49 PM

## 2014-05-30 ENCOUNTER — Inpatient Hospital Stay (HOSPITAL_COMMUNITY): Payer: Medicare Other | Admitting: Occupational Therapy

## 2014-05-30 ENCOUNTER — Inpatient Hospital Stay (HOSPITAL_COMMUNITY): Payer: Medicare Other | Admitting: Speech Pathology

## 2014-05-30 ENCOUNTER — Inpatient Hospital Stay (HOSPITAL_COMMUNITY): Payer: Medicare Other | Admitting: Physical Therapy

## 2014-05-30 MED ORDER — ALBUTEROL SULFATE (2.5 MG/3ML) 0.083% IN NEBU
2.5000 mg | INHALATION_SOLUTION | Freq: Three times a day (TID) | RESPIRATORY_TRACT | Status: DC
  Administered 2014-05-30 – 2014-05-31 (×3): 2.5 mg via RESPIRATORY_TRACT
  Filled 2014-05-30 (×4): qty 3

## 2014-05-30 NOTE — Progress Notes (Signed)
Physical Therapy Session Note  Patient Details  Name: TYQUEZ HOLLIBAUGH MRN: [id number] Date of Birth: 1934-06-18  Today's Date: 05/30/2014 PT Individual Time: 1100-1200 PT Individual Time Calculation (min): 60 min   Short Term Goals: Week 1:  PT Short Term Goal 1 (Week 1): pt will perform all bed mobility with supervision PT Short Term Goal 2 (Week 1): pt will transfer safely with min assist of 1 person 5/5 trials PT Short Term Goal 3 (Week 1): pt will perform gait x 150' with LRAD with min guard assist PT Short Term Goal 4 (Week 1): pt will ascend/descend 5 stairs 2 rails safely wtih min assist PT Short Term Goal 5 (Week 1): pt will route find gym>< room with max multi-modal cues  Skilled Therapeutic Interventions/Progress Updates:    W/C Management: PT instructs pt in w/c propulsion x 75' and pt has significant difficulty coordinating UEs to work req min-max A prn.   Gait Training: PT instructs pt in ambulation x >1000' without AD req CGA for safety - dual tasking asking pt questions about his family and home situation during this activity, as well as pathfinding his way back to the gym after walking off the unit and up/down a slight ramp in hallway connecting to the [employer]	[place] - req CGA for balance/safety and cues for attention to task, as pt is easily distracted by signs/pictures on the wall.  PT instructs pt in ascending/descending 12 stairs with B rails req CGA-min A for balance in step-over-step pattern and verbal cues to slow pace down for safety.   Therapeutic Exercise: PT instructs pt in nu-step at L5 with B UEs/LEs x 15 minutes with rest breaks prn for improved activity tolerance and conditioning.   Neuromuscular Reeducation: PT instructs pt in high level dynamic balance activities including: side stepping R/L with dual task activity of counting by 2's in ascending order req CGA for safety. Pt then reports he knows [employer] swing, so PT plays swing music on her phone and pt  leads in dancing with PT, demonstrating multi-directional stepping and leading while PT followed - no LOB noted and CGA only.   Pt demonstrates frustration with expressive aphasia as he is trying to explain something dance related to this PT that she cannot understand. Pt's upright physical mobility is good, but he has SOB with activity, likely due to weight/deconditioned state and cognitive deficits are significant, so  PT incorporates cognitive remediation tasks during physical activity. Pt does have difficulty with coordination activities, esp w/c management with arms. Continue per PT POC.      Therapy Documentation Precautions:  Precautions Precautions: Fall Precaution Comments: h/o dizziness in standing per family (?vestibular versus orthostatic issues), wife stated he has vertigo occasionally Restrictions Weight Bearing Restrictions: No Vital Signs: Therapy Vitals Pulse Rate: 67 BP: (!) 143/58 mmHg Patient Position (if appropriate): Sitting Oxygen Therapy SpO2: 100 % O2 Device: Not Delivered Pain: Pain Assessment Pain Assessment: No/denies pain Pain Score: 0-No pain Faces Pain Scale: No hurt PAINAD (Pain Assessment in Advanced Dementia) Breathing: normal  See FIM for current functional status  Therapy/Group: Individual Therapy  Johannah Rozas M 05/30/2014, 11:07 AM

## 2014-05-30 NOTE — Progress Notes (Signed)
Occupational Therapy Session Note  Patient Details  Name: DORAL VENTRELLA MRN: [id number] Date of Birth: 1934-04-09  Today's Date: 05/30/2014 OT Individual Time:  - 1300-1330   (30 min)      Short Term Goals: Week 1:  OT Short Term Goal 1 (Week 1): Pt will complete bathing with supervision with <25% cues OT Short Term Goal 2 (Week 1): Pt will complete LB dressing with min assist OT Short Term Goal 3 (Week 1): Pt will complete grooming in standing with correct identification of objects with <25% cues OT Short Term Goal 4 (Week 1): Pt will complete toilet transfers with supervision  Skilled Therapeutic Interventions/Progress Updates:    Pt finishing lunch upon OT arrival  Pt verbalized in one to two word phrases.  He verbalized it was April by responding to the correct month as OT verbalized them.  Ambulated with SBA around unit.  Pt unagle to verbalize street names.  Ambulated back to room and left with all needs in reach.    Therapy Documentation Precautions:  Precautions Precautions: Fall Precaution Comments: h/o dizziness in standing per family (?vestibular versus orthostatic issues), wife stated he has vertigo occasionally Restrictions Weight Bearing Restrictions: No General:   Vital Signs: Therapy Vitals Pulse Rate: 67 BP: (!) 143/58 mmHg Patient Position (if appropriate): Sitting Oxygen Therapy SpO2: 100 % O2 Device: Not Delivered Pain: Pain Assessment Pain Assessment: No/denies pain ADL:   Exercises:   Other Treatments:    See FIM for current functional status  Therapy/Group: Individual Therapy  Lisa Roca 05/30/2014, 1:17 PM

## 2014-05-30 NOTE — Progress Notes (Signed)
Subjective/Complaints: Aphasic,no apraxia noted per SLP during ADLs, answers simple questions, non fluent Audible wheezing, per RN voiding well      ROS - cannot obtain due to Aphasia Objective: Vital Signs: Blood pressure 184/72, pulse 56, temperature 97.7 F (36.5 C), temperature source Oral, resp. rate 20, weight 102.5 kg (225 lb 15.5 oz), SpO2 97 %. No results found. Results for orders placed or performed during the hospital encounter of 05/26/14 (from the past 72 hour(s))  Glucose, capillary     Status: None   Collection Time: 05/27/14 11:47 AM  Result Value Ref Range   Glucose-Capillary 94 70 - 99 mg/dL  Platelet inhibition p2y12     Status: Abnormal   Collection Time: 05/28/14  6:53 AM  Result Value Ref Range   Platelet Function  P2Y12 113 (L) 194 - 418 PRU    Comment:        The literature has shown a direct correlation of PRU values over 230 with higher risks of thrombotic events.  Lower PRU values are associated with platelet inhibition.      HEENT: normal Cardio: RRR and no murmur Resp: CTA B/L GI: BS positive, Distention and no tenderness Extremity:  Edema pedal Skin:   Bruise multiple UE Neuro: Alert/Oriented, , Abnormal Motor 4/5 in the right deltoid, biceps, triceps, grip, hip flexor, knee extensor and ankle dorsiflexor, Aphasic and Apraxic Musc/Skel:  Other no pain with upper extremity or lower extremity active range of motion Gen. no acute distress Standing balance fair  Assessment/Plan: 1. Functional deficits secondary to left MCA distribution infarct with right hemiparesis and aphasia as well as dysphagia  which require 3+ hours per day of interdisciplinary therapy in a comprehensive inpatient rehab setting. Physiatrist is providing close team supervision and 24 hour management of active medical problems listed below. Physiatrist and rehab team continue to assess barriers to discharge/monitor patient progress toward functional and medical  goals. FIM: FIM - Bathing Bathing Steps Patient Completed: Chest, Left lower leg (including foot), Right Arm, Left Arm, Abdomen, Front perineal area, Buttocks, Right upper leg, Left upper leg, Right lower leg (including foot) Bathing: 5: Supervision: Safety issues/verbal cues  FIM - Upper Body Dressing/Undressing Upper body dressing/undressing steps patient completed: Thread/unthread right sleeve of pullover shirt/dresss, Pull shirt over trunk, Thread/unthread left sleeve of pullover shirt/dress, Put head through opening of pull over shirt/dress Upper body dressing/undressing: 5: Supervision: Safety issues/verbal cues FIM - Lower Body Dressing/Undressing Lower body dressing/undressing steps patient completed: Thread/unthread right underwear leg, Fasten/unfasten pants, Fasten/unfasten left shoe, Thread/unthread left underwear leg, Pull underwear up/down, Thread/unthread right pants leg, Thread/unthread left pants leg, Pull pants up/down, Fasten/unfasten right shoe, Don/Doff left shoe, Don/Doff right shoe (chose not to wear socks today) Lower body dressing/undressing: 5: Supervision: Safety issues/verbal cues  FIM - Toileting Toileting steps completed by patient: Adjust clothing prior to toileting, Performs perineal hygiene, Adjust clothing after toileting Toileting Assistive Devices: Grab bar or rail for support Toileting: 5: Supervision: Safety issues/verbal cues  FIM - Radio producer Devices: Product manager Transfers: 5-To toilet/BSC: Supervision (verbal cues/safety issues), 5-From toilet/BSC: Supervision (verbal cues/safety issues)  FIM - Control and instrumentation engineer Devices: Arm rests Bed/Chair Transfer: 4: Chair or W/C > Bed: Min A (steadying Pt. > 75%), 5: Bed > Chair or W/C: Supervision (verbal cues/safety issues)  FIM - Locomotion: Ambulation Locomotion: Ambulation Assistive Devices: Other (comment) (none) Ambulation/Gait  Assistance: 4: Min guard Locomotion: Ambulation: 4: Travels 150 ft or more with minimal assistance (Pt.>75%)  Comprehension Comprehension Mode: Auditory Comprehension: 5-Understands complex 90% of the time/Cues < 10% of the time  Expression Expression Mode: Verbal Expression: 3-Expresses basic 50 - 74% of the time/requires cueing 25 - 50% of the time. Needs to repeat parts of sentences.  Social Interaction Social Interaction: 5-Interacts appropriately 90% of the time - Needs monitoring or encouragement for participation or interaction.  Problem Solving Problem Solving: 5-Solves complex 90% of the time/cues < 10% of the time  Memory Memory: 2-Recognizes or recalls 25 - 49% of the time/requires cueing 51 - 75% of the time  Medical Problem List and Plan: 1. Functional deficits secondary to left MCA territory infarct with ICA occlusion status post mechanical thrombectomy with left ICA stenting, embolic secondary to large vessel atherosclerosis 2.  DVT Prophylaxis/Anticoagulation: Subcutaneous Lovenox. Monitor platelet counts and any signs of bleeding. Venous Doppler studies 05/25/2014 negative 3. Pain Management: Tylenol as needed 4. Dysphagia. Dysphagia 1 thin liquid diet. Follow-up speech therapy 5. Neuropsych: This patient is capable of making decisions on his own behalf. 6. Skin/Wound Care: Routine skin checks 7. Fluids/Electrolytes/Nutrition: encourage po 8. Hyperlipidemia. Lipitor 9. Hypertension. Allowing for permissive hypertension. No present antihypertensive medication. Patient on Norvasc 5 mg daily, hydrochlorothiazide 25 mg daily prior to admission. Resume Norvasc as Systolic BP creeping up 10. GERD. Protonix 11. Wheezing: does not appear uncomfortable, sats ok. Afebrile. No cough. Keep an eye on weights  -increase alb neb to tid   LOS (Days) 4 A FACE TO FACE EVALUATION WAS PERFORMED  Zen Felling T 05/30/2014, 9:15 AM

## 2014-05-30 NOTE — Progress Notes (Signed)
Speech Language Pathology Daily Session Note  Patient Details  Name: Henry [name] MRN: [id number] Date of Birth: 06-03-1934  Today's Date: 05/30/2014 SLP Individual Time: 0915-1000 SLP Individual Time Calculation (min): 45 min  Short Term Goals: Week 1: SLP Short Term Goal 1 (Week 1): Pt will use multimodal communication to convey immediate needs/wants with max verbal and visual cues  SLP Short Term Goal 2 (Week 1): Pt will follow 2 step commands during basic, familiar ADLs/home management tasks with max assist verbal, visual, and demonstration cues.   SLP Short Term Goal 3 (Week 1): Pt will answer immediate, biographical, and/or environmental yes/no questions accurately with max assist verbal and visual cues.   SLP Short Term Goal 4 (Week 1): Pt will sustain attention to a basic familiar home mangement task for 3-5 minute intervals with mod cues verbal, visual, and tactile cues for redirection.   SLP Short Term Goal 5 (Week 1): Pt will consume dys 1 textures and thin liquids with min verbal and visual cues for use of rate and portion control as well as monitoring and correcting right sided buccal residue over 3 consecutive sessions to demonstrate readiness for diet progression.  SLP Short Term Goal 6 (Week 1): Pt will return demonstration of 2 safety precautions during basic functional tasks with max assist verbal,visual, and tactile cues.    Skilled Therapeutic Interventions: Skilled treatment session focused on functional communication goals. SLP facilitated session by providing Min A sentence completion and phonemic cues for naming familiar objects with 75% accuracy, Mod A question and verbal cues for identifying appropriate item that did not belong with the other with 100% accuracy and Max A multimodal cues for verbalizing the reason why at the word-phrase level. Patient was able to verbally report biographical information at the phrase level in regards to hobbies and number of family  members and their ages with Mod A verbal and visual cues but required total A to recall/verbalize his children's names. Patient reported, "I still can't believe I can't remember them." Patient independently asked a question in regards to generic medications and independently asked to use the bathroom. Patient ambulated to bathroom with supervision and was left on the commode with NT present at end of session. Continue with current plan of care.    FIM:  Comprehension Comprehension Mode: Auditory Comprehension: 5-Understands basic 90% of the time/requires cueing < 10% of the time Expression Expression Mode: Verbal Expression: 3-Expresses basic 50 - 74% of the time/requires cueing 25 - 50% of the time. Needs to repeat parts of sentences. Social Interaction Social Interaction: 4-Interacts appropriately 75 - 89% of the time - Needs redirection for appropriate language or to initiate interaction. Problem Solving Problem Solving: 3-Solves basic 50 - 74% of the time/requires cueing 25 - 49% of the time Memory Memory: 2-Recognizes or recalls 25 - 49% of the time/requires cueing 51 - 75% of the time  Pain Pain Assessment Pain Assessment: No/denies pain  Therapy/Group: Individual Therapy  Louetta Hollingshead 05/30/2014, 4:34 PM

## 2014-05-30 NOTE — Progress Notes (Signed)
Occupational Therapy Session Note  Patient Details  Name: Henry [name] MRN: [id number] Date of Birth: 05-24-1934  Today's Date: 05/30/2014 OT Individual Time: 0800-0900 OT Individual Time Calculation (min): 60 min     Skilled Therapeutic Interventions/Progress Updates: patient seen for in room shower via tub transfer bench with focus on slowing down for safe ambulation and turns due to impulsivity - especially on wet shower floor and expressing the need to go to the toilet and void after starting shower.   Pt was able to dress self without LOB or c/o dizzinesss or other.  Also worked on [employer] and R UE use, accuracy and strength.     Therapy Documentation Precautions:  Precautions Precautions: Fall Precaution Comments: h/o dizziness in standing per family (?vestibular versus orthostatic issues), wife stated he has vertigo occasionally Restrictions Weight Bearing Restrictions: No  Pain: denied   See FIM for current functional status  Therapy/Group: Individual Therapy  Alfredia Ferguson [facility] 05/30/2014, 8:39 AM

## 2014-05-31 ENCOUNTER — Encounter (HOSPITAL_COMMUNITY): Payer: Medicare Other | Admitting: Occupational Therapy

## 2014-05-31 ENCOUNTER — Inpatient Hospital Stay (HOSPITAL_COMMUNITY): Payer: Medicare Other | Admitting: Occupational Therapy

## 2014-05-31 MED ORDER — ALBUTEROL SULFATE (2.5 MG/3ML) 0.083% IN NEBU: 2.5000 mg | INHALATION_SOLUTION | Freq: Four times a day (QID) | RESPIRATORY_TRACT | Status: DC | PRN

## 2014-05-31 MED ORDER — TRAZODONE HCL 50 MG PO TABS
50.0000 mg | ORAL_TABLET | Freq: Every day | ORAL | Status: DC
  Administered 2014-05-31: 50 mg via ORAL
  Filled 2014-05-31: qty 1

## 2014-05-31 NOTE — Progress Notes (Signed)
Occupational Therapy Session Note  Patient Details  Name: Henry [name] MRN: [id number] Date of Birth: 12-31-1934  Today's Date: 05/31/2014 OT Individual Time: 2395-3202 OT Individual Time Calculation (min): 45 min    Short Term Goals: Week 1:  OT Short Term Goal 1 (Week 1): Pt will complete bathing with supervision with <25% cues OT Short Term Goal 2 (Week 1): Pt will complete LB dressing with min assist OT Short Term Goal 3 (Week 1): Pt will complete grooming in standing with correct identification of objects with <25% cues OT Short Term Goal 4 (Week 1): Pt will complete toilet transfers with supervision  Skilled Therapeutic Interventions/Progress Updates:    Engaged in ADL retraining with focus on increased safety and independence with self-care tasks of grooming, bathing, and dressing.  Pt ambulated to room shower with close supervision. Verbal cues for safety and improved sequencing with doffing shorts, educating on sitting to don and doff pants.  Bathing completed at supervision at sit > stand level with min cues for sequencing and thoroughness with bathing.  Again provided verbal cues for sitting to don pants to increase safety as pt attempting to don pants in single leg stance with difficulty maintaining standing balance while threading Rt pant leg.  Grooming tasks completed in standing with close supervision and cues to locate correct items for grooming as pt attempting to lather face with barrier cream.    Discussed with nurse tech attempting short trials of seated in room without supervision, therefore pt left in room seated in recliner with quick release belt in place.  Therapy Documentation Precautions:  Precautions Precautions: Fall Precaution Comments: h/o dizziness in standing per family (?vestibular versus orthostatic issues), wife stated he has vertigo occasionally Restrictions Weight Bearing Restrictions: No General:   Vital Signs: Therapy Vitals Temp: 97.4 F  (36.3 C) Temp Source: Oral Pulse Rate: 61 Resp: 19 BP: 135/71 mmHg Patient Position (if appropriate): Lying Oxygen Therapy SpO2: 97 % O2 Device: Not Delivered Pain:  Pt with no c/o pain  See FIM for current functional status  Therapy/Group: Individual Therapy  Simonne Come 05/31/2014, 8:35 AM

## 2014-05-31 NOTE — Progress Notes (Signed)
Subjective/Complaints: Didn'[name] sleep as well last night. Up at RN station when i arrived.      ROS - cannot obtain due to Aphasia Objective: Vital Signs: Blood pressure 135/71, pulse 61, temperature 97.4 F (36.3 C), temperature source Oral, resp. rate 19, weight 102.5 kg (225 lb 15.5 oz), SpO2 97 %. No results found. No results found for this or any previous visit (from the past 72 hour(s)).   HEENT: normal Cardio: RRR and no murmur Resp: CTA B/L GI: BS positive, Distention and no tenderness Extremity:  Edema pedal Skin:   Bruise multiple UE Neuro: Alert/Oriented, , Abnormal Motor 4/5 in the right deltoid, biceps, triceps, grip, hip flexor, knee extensor and ankle dorsiflexor, Aphasic and Apraxic Musc/Skel:  Other no pain with upper extremity or lower extremity active range of motion Gen. no acute distress Standing balance fair  Assessment/Plan: 1. Functional deficits secondary to left MCA distribution infarct with right hemiparesis and aphasia as well as dysphagia  which require 3+ hours per day of interdisciplinary therapy in a comprehensive inpatient rehab setting. Physiatrist is providing close team supervision and 24 hour management of active medical problems listed below. Physiatrist and rehab team continue to assess barriers to discharge/monitor patient progress toward functional and medical goals. FIM: FIM - Bathing Bathing Steps Patient Completed: Chest, Left lower leg (including foot), Right Arm, Left Arm, Abdomen, Front perineal area, Buttocks, Right upper leg, Left upper leg, Right lower leg (including foot) Bathing: 5: Supervision: Safety issues/verbal cues  FIM - Upper Body Dressing/Undressing Upper body dressing/undressing steps patient completed: Thread/unthread right sleeve of pullover shirt/dresss, Pull shirt over trunk, Thread/unthread left sleeve of pullover shirt/dress, Put head through opening of pull over shirt/dress Upper body dressing/undressing: 5:  Supervision: Safety issues/verbal cues FIM - Lower Body Dressing/Undressing Lower body dressing/undressing steps patient completed: Thread/unthread right underwear leg, Fasten/unfasten pants, Fasten/unfasten left shoe, Thread/unthread left underwear leg, Pull underwear up/down, Thread/unthread right pants leg, Thread/unthread left pants leg, Pull pants up/down, Fasten/unfasten right shoe, Don/Doff left shoe, Don/Doff right shoe Lower body dressing/undressing: 4: Steadying Assist  FIM - Toileting Toileting steps completed by patient: Adjust clothing prior to toileting, Performs perineal hygiene, Adjust clothing after toileting Toileting Assistive Devices: Grab bar or rail for support Toileting: 5: Supervision: Safety issues/verbal cues  FIM - Radio producer Devices: Product manager Transfers: 5-To toilet/BSC: Supervision (verbal cues/safety issues), 5-From toilet/BSC: Supervision (verbal cues/safety issues)  FIM - Control and instrumentation engineer Devices: Arm rests Bed/Chair Transfer: 4: Chair or W/C > Bed: Min A (steadying Pt. > 75%)  FIM - Locomotion: Wheelchair Locomotion: Wheelchair: 2: Travels 50 - 149 ft with maximal assistance (Pt: 25 - 49%) FIM - Locomotion: Ambulation Locomotion: Ambulation Assistive Devices: Other (comment) (hand hold assist, porgressing to CGA at gait belt) Ambulation/Gait Assistance: 4: Min guard Locomotion: Ambulation: 4: Travels 150 ft or more with minimal assistance (Pt.>75%)  Comprehension Comprehension Mode: Auditory Comprehension: 5-Understands basic 90% of the time/requires cueing < 10% of the time  Expression Expression Mode: Verbal Expression: 3-Expresses basic 50 - 74% of the time/requires cueing 25 - 50% of the time. Needs to repeat parts of sentences.  Social Interaction Social Interaction: 4-Interacts appropriately 75 - 89% of the time - Needs redirection for appropriate language or to initiate  interaction.  Problem Solving Problem Solving: 3-Solves basic 50 - 74% of the time/requires cueing 25 - 49% of the time  Memory Memory: 2-Recognizes or recalls 25 - 49% of the time/requires cueing 51 - 75%  of the time  Medical Problem List and Plan: 1. Functional deficits secondary to left MCA territory infarct with ICA occlusion status post mechanical thrombectomy with left ICA stenting, embolic secondary to large vessel atherosclerosis 2.  DVT Prophylaxis/Anticoagulation: Subcutaneous Lovenox. Monitor platelet counts and any signs of bleeding. Venous Doppler studies 05/25/2014 negative 3. Pain Management: Tylenol as needed 4. Dysphagia. Dysphagia 1 thin liquid diet. Follow-up speech therapy 5. Neuropsych: This patient is capable of making decisions on his own behalf.  -will schedule trazodone for sleep 6. Skin/Wound Care: Routine skin checks 7. Fluids/Electrolytes/Nutrition: encourage po 8. Hyperlipidemia. Lipitor 9. Hypertension. Allowing for permissive hypertension. No present antihypertensive medication. Patient on Norvasc 5 mg daily, hydrochlorothiazide 25 mg daily prior to admission. Resumed Norvasc as Systolic BP creeping up 10. GERD. Protonix 11. Wheezing: does not appear uncomfortable, sats ok. Afebrile. No cough. Keep an eye on weights  -continue albuteraol neb to tid   LOS (Days) 5 A FACE TO FACE EVALUATION WAS PERFORMED  Henry [name] 05/31/2014, 9:14 AM

## 2014-06-01 ENCOUNTER — Inpatient Hospital Stay (HOSPITAL_COMMUNITY): Payer: Medicare Other

## 2014-06-01 ENCOUNTER — Inpatient Hospital Stay (HOSPITAL_COMMUNITY): Payer: Medicare Other | Admitting: Speech Pathology

## 2014-06-01 ENCOUNTER — Encounter (HOSPITAL_COMMUNITY): Payer: Medicare Other | Admitting: Speech Pathology

## 2014-06-01 ENCOUNTER — Inpatient Hospital Stay (HOSPITAL_COMMUNITY): Payer: Medicare Other | Admitting: Occupational Therapy

## 2014-06-01 DIAGNOSIS — R319 Hematuria, unspecified: Secondary | ICD-10-CM

## 2014-06-01 LAB — CBC
HEMATOCRIT: 36.1 % — AB (ref 39.0–52.0)
HEMOGLOBIN: 11.7 g/dL — AB (ref 13.0–17.0)
MCH: 27.9 pg (ref 26.0–34.0)
MCHC: 32.4 g/dL (ref 30.0–36.0)
MCV: 86.2 fL (ref 78.0–100.0)
Platelets: 276 10*3/uL (ref 150–400)
RBC: 4.19 MIL/uL — ABNORMAL LOW (ref 4.22–5.81)
RDW: 14.1 % (ref 11.5–15.5)
WBC: 11.6 10*3/uL — ABNORMAL HIGH (ref 4.0–10.5)

## 2014-06-01 LAB — URINE MICROSCOPIC-ADD ON

## 2014-06-01 LAB — URINALYSIS, ROUTINE W REFLEX MICROSCOPIC
Bilirubin Urine: NEGATIVE
Glucose, UA: NEGATIVE mg/dL
Ketones, ur: NEGATIVE mg/dL
LEUKOCYTES UA: NEGATIVE
Nitrite: NEGATIVE
PROTEIN: NEGATIVE mg/dL
Specific Gravity, Urine: 1.009 (ref 1.005–1.030)
UROBILINOGEN UA: 0.2 mg/dL (ref 0.0–1.0)
pH: 7 (ref 5.0–8.0)

## 2014-06-01 MED ORDER — ALPRAZOLAM 0.5 MG PO TABS
0.5000 mg | ORAL_TABLET | Freq: Three times a day (TID) | ORAL | Status: DC | PRN
Start: 2014-06-01 — End: 2014-06-05
  Administered 2014-06-01: 0.5 mg via ORAL
  Filled 2014-06-01: qty 1

## 2014-06-01 MED ORDER — PHENAZOPYRIDINE HCL 100 MG PO TABS
100.0000 mg | ORAL_TABLET | Freq: Three times a day (TID) | ORAL | Status: DC
  Administered 2014-06-01 – 2014-06-03 (×8): 100 mg via ORAL
  Filled 2014-06-01 (×12): qty 1

## 2014-06-01 MED ORDER — OXYCODONE HCL 5 MG PO TABS
10.0000 mg | ORAL_TABLET | Freq: Four times a day (QID) | ORAL | Status: DC | PRN
  Administered 2014-06-01: 10 mg via ORAL
  Filled 2014-06-01: qty 2

## 2014-06-01 MED ORDER — STERILE WATER FOR IRRIGATION IR SOLN
Status: DC
  Administered 2014-06-01 (×2): 3000 mL

## 2014-06-01 MED ORDER — FINASTERIDE 5 MG PO TABS
5.0000 mg | ORAL_TABLET | Freq: Every day | ORAL | Status: DC
  Administered 2014-06-01 – 2014-06-05 (×5): 5 mg via ORAL
  Filled 2014-06-01 (×6): qty 1

## 2014-06-01 NOTE — Progress Notes (Signed)
Patient did not sleep very well last night. On trazadone scheduled. Went to the bathroom several times to void and had bowel movement twice that was incontinent. Condom catheter was placed but patient seem uncomfortable with it. Up at the nurse's station for  approximately an 1 1/2 for safety. Denies any pain.

## 2014-06-01 NOTE — Progress Notes (Signed)
Patient with considerable amount of hematuria. Hemoglobin remained stable. Urine study is pending. A Foley catheter tube inserted with ongoing bouts of hematuria. Urology consulted advised Foley catheter tube #22 with urology cart ordered to bedside and await recommendations. Subcutaneous tissues Lovenox has been discontinued as patient is mobile. Plavix also held today in light of hematuria at the recommendations of urology.

## 2014-06-01 NOTE — Progress Notes (Signed)
Patient in distress from inability to urinate with foley catheter in place at 0950. Notified Marlowe Shores, PA-C after patient passed large clot through tip of urethra around catheter. Marlowe Shores, PA-C notified Dr. Jeffie Pollock with Urology and received orders for a 22 Fr foley catheter and a Urology Cart. Dr. Jeffie Pollock to come by later to see patient. Patient was medicated with 0.5mg  Xanax and 10mg  Oxy IR prior to foley placement. Received 22 Fr. Coude catheter and inserted at approximately 1250 with no urine output. Dr. Jeffie Pollock arrived and placed a 24 Fr. Triple lumen foley at approximately and irrigated the patient's bladder. Dr. Jeffie Pollock placed the patient on CBI. Patient in resting comfortably in bed with no c/o of discomfort. Dr. Jeffie Pollock will return this evening to re-evaluate the patient. Continue with plan of care.

## 2014-06-01 NOTE — Progress Notes (Signed)
Occupational Therapy Note  Patient Details  Name: Henry [name] MRN: [id number] Date of Birth: Aug 26, 1934  Pt missed 60 mins OT secondary to medical issue.  Nursing tending to pt's foley catheter and getting him cleaned up.  Will check back to proceed with scheduled time at 1:30 if medically cleared.      Kayonna Lawniczak OTR/L 06/01/2014, 12:08 PM

## 2014-06-01 NOTE — Progress Notes (Addendum)
Bladder scanned patient it was 999 cc, performed i&o cath for 400 cc. Urine appearance was bloody. PVR was 742 cc. Orders received for urinalysis and culture, result pending.  Per Linna Hoff, do not cath for now. Patient felt comfortable after cath and was able to sleep for few minutes.

## 2014-06-01 NOTE — Progress Notes (Signed)
Physical Therapy Session Note  Patient Details  Name: Henry [name] MRN: [id number] Date of Birth: 1934/09/09  Today's Date: 06/01/2014 PT Individual Time: 0902-0932 PT Individual Time Calculation (min): 30 min   Short Term Goals: Week 1:  PT Short Term Goal 1 (Week 1): pt will perform all bed mobility with supervision PT Short Term Goal 2 (Week 1): pt will transfer safely with min assist of 1 person 5/5 trials PT Short Term Goal 3 (Week 1): pt will perform gait x 150' with LRAD with min guard assist PT Short Term Goal 4 (Week 1): pt will ascend/descend 5 stairs 2 rails safely wtih min assist PT Short Term Goal 5 (Week 1): pt will route find gym>< room with max multi-modal cues     Skilled Therapeutic Interventions/Progress Updates:  RN reported pt had Foley inserted this AM due to retention.  She reported pt had blood from penis, and in urine. Cleared for therapies; meds given.  Pt quite impulsive due to pain.  Attempted NuStep, but pt too uncomfortable sitting in seat.  Pt unsafe and repeatedly trying to move out of w/c with brakes unlocked, before PT had placed w/c appropriately.  Gait on level tile and carpet x 75'.  Transporting coat hangers during gait over carpet; retrieving coat hangers and later hanging back up.  All with min guard assist (hand hold assistance) and multi modal cues.  Pt impulsively sat and lay down on bed in ADL apt.  After several minutes rest, able to ambulate back to gym.  At that time, pt noted to be leaving drops of blood on his shoes and floor.  Returned to room, transferred to recliner with min guard assist.  Max cues for impulsivity due to pain. RN informed. Blood appears to be coming from penis, not urine.    Therapy Documentation Precautions:  Precautions Precautions: Fall Precaution Comments: h/o dizziness in standing per family (?vestibular versus orthostatic issues), wife stated he has vertigo occasionally Restrictions Weight Bearing  Restrictions: No General: PT Amount of Missed Time (min): 30 Minutes PT Missed Treatment Reason: Patient ill (Comment);Pain;Nursing care Pain: Pain Assessment Faces Pain Scale: Hurts whole lot (penis; after Foley inserted this AM)      See FIM for current functional status  Therapy/Group: Individual Therapy  [name] 06/01/2014, 9:45 AM

## 2014-06-01 NOTE — Consult Note (Addendum)
Subjective: I was called to see Mr. Henry [name] in consultation by Dr. Letta Pate for clot retention.   He has a history of a CVA and is on rehab.  He had the onset yesterday of gross hematuria and increased difficulty voiding.  A bladder scan showed 764ml and a foley was placed with return of some clot but it was a 16 fr and became obstructed.   It was replaced with a 25fr coude but that also obstructed.  He is aphasic and obtaining a history is difficulty but he doesn't indicate prior urologic issues.    ROS:  Review of Systems  Unable to perform ROS: medical condition   No Known Allergies  Past Medical History  Diagnosis Date  . Hypertension   . GERD (gastroesophageal reflux disease)   . Hypercholesteremia   . HOH (hard of hearing)     Past Surgical History  Procedure Laterality Date  . Shoulder surgery Right     torn cartiledge  . Esophagogastroduodenoscopy  10/28/2010    Procedure: ESOPHAGOGASTRODUODENOSCOPY (EGD);  Surgeon: [name], MD;  [facility]: [facility];  Service: Endoscopy;  Laterality: N/A;  10:30  . Colonoscopy    . Colonoscopy  05/17/2011    Procedure: COLONOSCOPY;  Surgeon: [name], MD;  Location: [facility];  Service: Endoscopy;  Laterality: N/A;  830  . Resection distal clavical Right 12/13/2012    Procedure: RESECTION DISTAL CLAVICAL;  Surgeon: Carole Civil, MD;  Location: [facility];  Service: Orthopedics;  Laterality: Right;  . Shoulder arthroscopy with bicepstenotomy Right 12/13/2012    Procedure: SHOULDER ARTHROSCOPY WITH BICEPS TENOTOMY, limited shoulder debridement;  Surgeon: Carole Civil, MD;  Location: [facility];  Service: Orthopedics;  Laterality: Right;  . Esophagogastroduodenoscopy N/A 04/24/2014    Procedure: ESOPHAGOGASTRODUODENOSCOPY (EGD);  Surgeon: [name], MD;  Location: [facility];  Service: Endoscopy;  Laterality: N/A;  815  . Radiology with anesthesia N/A 05/23/2014    Procedure: RADIOLOGY WITH ANESTHESIA;   Surgeon: Medication Radiologist, MD;  Location: [facility];  Service: Radiology;  Laterality: N/A;    History   Social History  . Marital Status: Married    Spouse Name: N/A  . Number of Children: N/A  . Years of Education: N/A   Occupational History  . Not on file.   Social History Main Topics  . Smoking status: Former Smoker -- 0.50 packs/day for 35 years    Types: Cigarettes    Quit date: 12/09/1968  . Smokeless tobacco: Not on file  . Alcohol Use: 1.8 oz/week    3 Glasses of wine per week  . Drug Use: No  . Sexual Activity: Yes    Birth Control/ Protection: None   Other Topics Concern  . Not on file   Social History Narrative    Family History  Problem Relation Age of Onset  . Colon cancer Neg Hx     Anti-infectives: Anti-infectives    None      Current Facility-Administered Medications  Medication Dose Route Frequency Provider Last Rate Last Dose  . acetaminophen (TYLENOL) tablet 1,000 mg  1,000 mg Oral Q6H PRN Lavon Paganini Angiulli, PA-C   1,000 mg at 06/01/14 6948   Or  . acetaminophen (TYLENOL) suppository 650 mg  650 mg Rectal Q6H PRN Lavon Paganini Angiulli, PA-C      . albuterol (PROVENTIL) (2.5 MG/3ML) 0.083% nebulizer solution 2.5 mg  2.5 mg Nebulization Q6H PRN Charlett Blake, MD      .  ALPRAZolam Duanne Moron) tablet 0.5 mg  0.5 mg Oral TID PRN Lavon Paganini Angiulli, PA-C   0.5 mg at 06/01/14 1046  . amLODipine (NORVASC) tablet 5 mg  5 mg Oral Daily Charlett Blake, MD   5 mg at 06/01/14 0933  . aspirin EC tablet 81 mg  81 mg Oral Daily Lavon Paganini Angiulli, PA-C   81 mg at 06/01/14 4098  . atorvastatin (LIPITOR) tablet 40 mg  40 mg Oral QPM Daniel J Angiulli, PA-C   40 mg at 05/31/14 1709  . ciprofloxacin (CILOXAN) 0.3 % ophthalmic solution 1 drop  1 drop Both Eyes Q4H while awake Lavon Paganini Angiulli, PA-C   1 drop at 06/01/14 1456  . feeding supplement (ENSURE ENLIVE) (ENSURE ENLIVE) liquid 237 mL  237 mL Oral TID WC Lavon Paganini Angiulli, PA-C   Stopped at 05/31/14 1800   . finasteride (PROSCAR) tablet 5 mg  5 mg Oral Daily [name], MD   5 mg at 06/01/14 1455  . ondansetron (ZOFRAN) tablet 4 mg  4 mg Oral Q6H PRN Lavon Paganini Angiulli, PA-C       Or  . ondansetron (ZOFRAN) injection 4 mg  4 mg Intravenous Q6H PRN Lavon Paganini Angiulli, PA-C      . oxyCODONE (Oxy IR/ROXICODONE) immediate release tablet 10 mg  10 mg Oral Q6H PRN Lavon Paganini Angiulli, PA-C   10 mg at 06/01/14 1159  . phenazopyridine (PYRIDIUM) tablet 100 mg  100 mg Oral TID WC Daniel J Angiulli, PA-C   100 mg at 06/01/14 1456  . sorbitol 70 % solution 30 mL  30 mL Oral Daily PRN Lavon Paganini Angiulli, PA-C      . sterile water for irrigation for irrigation   Irrigation Continuous [name], MD         Objective: Vital signs in last 24 hours: Temp:  [97.8 F (36.6 C)-98.4 F (36.9 C)] 98.4 F (36.9 C) (04/18 1359) Pulse Rate:  [66-89] 66 (04/18 1359) Resp:  [19-20] 20 (04/18 0933) BP: (124-182)/(63-89) 124/63 mmHg (04/18 1359) SpO2:  [96 %-97 %] 96 % (04/18 1359) Weight:  [109 kg (240 lb 4.8 oz)] 109 kg (240 lb 4.8 oz) (04/18 0403)  Intake/Output from previous day: 04/17 0701 - 04/18 0700 In: 720 [P.O.:720] Out: 1242 [Urine:1242] Intake/Output this shift: Total I/O In: 120 [P.O.:120] Out: 1750 [Urine:1750]   Physical Exam  Constitutional: He appears distressed (in pain from retention. ).  Cardiovascular: Normal rate and regular rhythm.   Pulmonary/Chest: Effort normal and breath sounds normal.  Abdominal: Soft. He exhibits mass (suprapubic). There is tenderness.  Genitourinary:  Normal phallus with a foley indwelling and blood at the meatus.   Scrotum, testes and epididymes are unremarkable.   Musculoskeletal: Normal range of motion. He exhibits edema. He exhibits no tenderness.  Neurological: He is alert.  aphasic  Skin: Skin is warm and dry.  Vitals reviewed.   Lab Results:   Recent Labs  06/01/14 0819  WBC 11.6*  HGB 11.7*  HCT 36.1*  PLT 276   BMET No results for  input(s): NA, K, CL, CO2, GLUCOSE, BUN, CREATININE, CALCIUM in the last 72 hours. PT/INR No results for input(s): LABPROT, INR in the last 72 hours. ABG No results for input(s): PHART, HCO3 in the last 72 hours.  Invalid input(s): PCO2, PO2  Studies/Results: No results found.  I have reviewed the patient's hospital notes and labs.   Procedure:  I attempted to irrigate the 17fr foley without success and a  12fr coude hematuria 3-way foley was placed without difficult.   The bladder was then irrigated with several hundred ml of saline with return of a large amount of clot and several hundred ml of bloody urine.  He was eventually cleared and the foley balloon was filled to 43ml and placed to CBI with NS.   The catheter had very little external tubing once positioned indicating a very large prostate.  He continued to have some oozing around the foley at the meatus.   Assessment: Hematuria secondary to anticoagulation and BPH with clot retention.    Plan: I am going to add finasteride 5mg  daily. He will need a more complete hematuria evaluation at some point with a CT and cystoscopy. He should remain off of lovenox and antiplatelet agents at this time.   He has apparently seen a urologist in [facility] in the past and could return there post discharge for his evaluation.    CC: Dr. Alysia Penna.     LOS: 6 days    Malka So 06/01/2014

## 2014-06-01 NOTE — Progress Notes (Signed)
Patient ID: Henry [name], male   DOB: 1934-06-07, 80 y.o.   MRN: [id number]  I was back in to see the patient this evening and his urine is clear on irrigation.  His family was in the room and let me know that he doesn't have a urologist in [place].    I am going to go ahead and order a CT for his hematuria w/u and will consider cystoscopy during the hospital stay.

## 2014-06-01 NOTE — Progress Notes (Signed)
Subjective/Complaints: Poor sleep , place on trazodone last noc., unble to void with elevated bladder scan, blood and discomfort with cath, foley placed and draining well      ROS - cannot obtain due to Aphasia Objective: Vital Signs: Blood pressure 182/82, pulse 79, temperature 97.8 F (36.6 C), temperature source Oral, resp. rate 19, weight 109 kg (240 lb 4.8 oz), SpO2 96 %. No results found. Results for orders placed or performed during the hospital encounter of 05/26/14 (from the past 72 hour(s))  Urinalysis, Routine w reflex microscopic     Status: Abnormal   Collection Time: 06/01/14  5:45 AM  Result Value Ref Range   Color, Urine YELLOW YELLOW   APPearance CLOUDY (A) CLEAR   Specific Gravity, Urine 1.009 1.005 - 1.030   pH 7.0 5.0 - 8.0   Glucose, UA NEGATIVE NEGATIVE mg/dL   Hgb urine dipstick LARGE (A) NEGATIVE   Bilirubin Urine NEGATIVE NEGATIVE   Ketones, ur NEGATIVE NEGATIVE mg/dL   Protein, ur NEGATIVE NEGATIVE mg/dL   Urobilinogen, UA 0.2 0.0 - 1.0 mg/dL   Nitrite NEGATIVE NEGATIVE   Leukocytes, UA NEGATIVE NEGATIVE  Urine microscopic-add on     Status: None   Collection Time: 06/01/14  5:45 AM  Result Value Ref Range   Squamous Epithelial / LPF RARE RARE   RBC / HPF TOO NUMEROUS TO COUNT <3 RBC/hpf   Bacteria, UA RARE RARE     HEENT: normal Cardio: RRR and no murmur Resp: CTA B/L GI: BS positive, Distention and no tenderness Extremity:  Edema pedal Skin:   Bruise multiple UE Neuro: Alert/Oriented, , Abnormal Motor 4/5 in the right deltoid, biceps, triceps, grip, hip flexor, knee extensor and ankle dorsiflexor, Aphasic and Apraxic Musc/Skel:  Other no pain with upper extremity or lower extremity active range of motion Gen. no acute distress Standing balance fair  Assessment/Plan: 1. Functional deficits secondary to left MCA distribution infarct with right hemiparesis and aphasia as well as dysphagia  which require 3+ hours per day of interdisciplinary  therapy in a comprehensive inpatient rehab setting. Physiatrist is providing close team supervision and 24 hour management of active medical problems listed below. Physiatrist and rehab team continue to assess barriers to discharge/monitor patient progress toward functional and medical goals. FIM: FIM - Bathing Bathing Steps Patient Completed: Chest, Left lower leg (including foot), Right Arm, Left Arm, Abdomen, Front perineal area, Buttocks, Right upper leg, Left upper leg, Right lower leg (including foot) Bathing: 5: Supervision: Safety issues/verbal cues  FIM - Upper Body Dressing/Undressing Upper body dressing/undressing steps patient completed: Thread/unthread right sleeve of pullover shirt/dresss, Pull shirt over trunk, Thread/unthread left sleeve of pullover shirt/dress, Put head through opening of pull over shirt/dress Upper body dressing/undressing: 5: Supervision: Safety issues/verbal cues FIM - Lower Body Dressing/Undressing Lower body dressing/undressing steps patient completed: Thread/unthread right underwear leg, Fasten/unfasten pants, Fasten/unfasten left shoe, Thread/unthread left underwear leg, Pull underwear up/down, Thread/unthread right pants leg, Thread/unthread left pants leg, Pull pants up/down, Fasten/unfasten right shoe, Don/Doff left shoe, Don/Doff right shoe Lower body dressing/undressing: 4: Steadying Assist  FIM - Toileting Toileting steps completed by patient: Adjust clothing prior to toileting, Performs perineal hygiene, Adjust clothing after toileting Toileting Assistive Devices: Grab bar or rail for support Toileting: 5: Supervision: Safety issues/verbal cues  FIM - Radio producer Devices: Grab bars Toilet Transfers: 5-To toilet/BSC: Supervision (verbal cues/safety issues), 5-From toilet/BSC: Supervision (verbal cues/safety issues)  FIM - Control and instrumentation engineer Devices: Arm rests Bed/Chair Transfer:  4:  Chair or W/C > Bed: Min A (steadying Pt. > 75%)  FIM - Locomotion: Wheelchair Locomotion: Wheelchair: 2: Travels 50 - 149 ft with maximal assistance (Pt: 25 - 49%) FIM - Locomotion: Ambulation Locomotion: Ambulation Assistive Devices: Other (comment) (hand hold assist, porgressing to CGA at gait belt) Ambulation/Gait Assistance: 4: Min guard Locomotion: Ambulation: 4: Travels 150 ft or more with minimal assistance (Pt.>75%)  Comprehension Comprehension Mode: Auditory Comprehension: 5-Understands basic 90% of the time/requires cueing < 10% of the time  Expression Expression Mode: Verbal Expression: 3-Expresses basic 50 - 74% of the time/requires cueing 25 - 50% of the time. Needs to repeat parts of sentences.  Social Interaction Social Interaction: 4-Interacts appropriately 75 - 89% of the time - Needs redirection for appropriate language or to initiate interaction.  Problem Solving Problem Solving: 3-Solves basic 50 - 74% of the time/requires cueing 25 - 49% of the time  Memory Memory: 2-Recognizes or recalls 25 - 49% of the time/requires cueing 51 - 75% of the time  Medical Problem List and Plan: 1. Functional deficits secondary to left MCA territory infarct with ICA occlusion status post mechanical thrombectomy with left ICA stenting, embolic secondary to large vessel atherosclerosis 2.  DVT Prophylaxis/Anticoagulation: Subcutaneous Lovenox. Monitor platelet counts and any signs of bleeding. Venous Doppler studies 05/25/2014 negative 3. Pain Management: Tylenol as needed 4. Dysphagia. Dysphagia 1 thin liquid diet. Follow-up speech therapy 5. Neuropsych: This patient is capable of making decisions on his own behalf.  -will schedule trazodone for sleep 6. Skin/Wound Care: Routine skin checks 7. Fluids/Electrolytes/Nutrition: encourage po 8. Hyperlipidemia. Lipitor 9. Hypertension. Allowing for permissive hypertension. No present antihypertensive medication. Patient on Norvasc 5 mg  daily, hydrochlorothiazide 25 mg daily prior to admission. Resumed Norvasc as Systolic BP creeping up 10. GERD. Protonix 11. Wheezing: does not appear uncomfortable, sats ok. Afebrile. No cough. Keep an eye on weights  -continue albuteraol neb to tid 12.  Urinary retention, suspect anticholinergic effect of trazodone, urethral trauma from cath and bleeding from combo of plavic , asa and lovenox,  Will d/c lovenox since mobility improving.check cbc, expect resolution over several days LOS (Days) 6 A FACE TO FACE EVALUATION WAS PERFORMED  Talmage Teaster E 06/01/2014, 8:30 AM

## 2014-06-01 NOTE — Progress Notes (Signed)
Occupational Therapy Note  Patient Details  Name: BARACK NICODEMUS MRN: [id number] Date of Birth: [birth year]  Pt missed 30 mins PM OT session secondary to nursing care and recent MD visit.  Per nursing recommendation will hold therapy at this time.    Korri Ask OTR/L 06/01/2014, 1:41 PM

## 2014-06-01 NOTE — Progress Notes (Signed)
Occupational Therapy Note  Patient Details  Name: Henry [name] MRN: [id number] Date of Birth: 27-Feb-1934  Today's Date: 06/01/2014 OT Missed Time: 64 Minutes Missed Time Reason: Nursing care;Other (comment)  Pt missed 30 mins self feeding group with OT and ST.  Pt receiving nursing care and PA stated to hold therapy for now.   Leotis Shames [facility] 06/01/2014, 12:55 PM

## 2014-06-01 NOTE — Progress Notes (Signed)
SLP Cancellation Note  Patient Details Name: DEMIR TITSWORTH MRN: [id number] DOB: Aug 10, 1934   Cancelled treatment:        Pt missed 30 minutes of skilled ST due to medical issues (significant, persistent hematuria) with ongoing work up with urology.  PA and RN request therapies held today.                                                                                                  Josy Peaden, Selinda Orion 06/01/2014, 3:44 PM

## 2014-06-02 ENCOUNTER — Inpatient Hospital Stay (HOSPITAL_COMMUNITY): Payer: Medicare Other | Admitting: Occupational Therapy

## 2014-06-02 ENCOUNTER — Inpatient Hospital Stay (HOSPITAL_COMMUNITY): Payer: Medicare Other

## 2014-06-02 ENCOUNTER — Inpatient Hospital Stay (HOSPITAL_COMMUNITY): Payer: Medicare Other | Admitting: *Deleted

## 2014-06-02 ENCOUNTER — Inpatient Hospital Stay (HOSPITAL_COMMUNITY): Payer: Medicare Other | Admitting: Speech Pathology

## 2014-06-02 LAB — CBC
HCT: 30.6 % — ABNORMAL LOW (ref 39.0–52.0)
HEMOGLOBIN: 9.8 g/dL — AB (ref 13.0–17.0)
MCH: 28.2 pg (ref 26.0–34.0)
MCHC: 32 g/dL (ref 30.0–36.0)
MCV: 87.9 fL (ref 78.0–100.0)
PLATELETS: 250 10*3/uL (ref 150–400)
RBC: 3.48 MIL/uL — AB (ref 4.22–5.81)
RDW: 14.5 % (ref 11.5–15.5)
WBC: 10.4 10*3/uL (ref 4.0–10.5)

## 2014-06-02 LAB — URINE CULTURE
Colony Count: NO GROWTH
Culture: NO GROWTH

## 2014-06-02 LAB — CREATININE, SERUM
Creatinine, Ser: 1.14 mg/dL (ref 0.50–1.35)
GFR, EST AFRICAN AMERICAN: 70 mL/min — AB (ref >90)
GFR, EST NON AFRICAN AMERICAN: 60 mL/min — AB (ref >90)

## 2014-06-02 MED ORDER — SENNOSIDES-DOCUSATE SODIUM 8.6-50 MG PO TABS
2.0000 | ORAL_TABLET | Freq: Two times a day (BID) | ORAL | Status: DC
  Administered 2014-06-02 – 2014-06-05 (×7): 2 via ORAL
  Filled 2014-06-02 (×7): qty 2

## 2014-06-02 MED ORDER — IOHEXOL 300 MG/ML  SOLN
100.0000 mL | Freq: Once | INTRAMUSCULAR | Status: AC | PRN
  Administered 2014-06-02: 100 mL via INTRAVENOUS

## 2014-06-02 MED ORDER — FLEET ENEMA 7-19 GM/118ML RE ENEM: 1.0000 | ENEMA | Freq: Every day | RECTAL | Status: DC | PRN

## 2014-06-02 MED ORDER — TAMSULOSIN HCL 0.4 MG PO CAPS
0.4000 mg | ORAL_CAPSULE | Freq: Every day | ORAL | Status: DC
  Administered 2014-06-02 – 2014-06-03 (×2): 0.4 mg via ORAL
  Filled 2014-06-02 (×3): qty 1

## 2014-06-02 NOTE — Progress Notes (Signed)
Patient ID: Henry [name], male   DOB: 06-17-1934, 80 y.o.   MRN: [id number]    Subjective: Henry [name] is doing well today.  His urine is almost clear on no irrigation.   He has no pain.   The CT showed no significant renal or ureteral abnormalities.  The foley is in good position.  His prostate is very large with a 7cm diameter.   There is a mass in the tail of the pancreas that will need further f/u.  ROS:  Review of Systems  Constitutional: Negative for fever.  Gastrointestinal: Negative for abdominal pain.    Anti-infectives: Anti-infectives    None      Current Facility-Administered Medications  Medication Dose Route Frequency Provider Last Rate Last Dose  . acetaminophen (TYLENOL) tablet 1,000 mg  1,000 mg Oral Q6H PRN Lavon Paganini Angiulli, PA-C   1,000 mg at 06/01/14 9509   Or  . acetaminophen (TYLENOL) suppository 650 mg  650 mg Rectal Q6H PRN Lavon Paganini Angiulli, PA-C      . albuterol (PROVENTIL) (2.5 MG/3ML) 0.083% nebulizer solution 2.5 mg  2.5 mg Nebulization Q6H PRN Charlett Blake, MD      . ALPRAZolam Duanne Moron) tablet 0.5 mg  0.5 mg Oral TID PRN Lavon Paganini Angiulli, PA-C   0.5 mg at 06/01/14 1046  . amLODipine (NORVASC) tablet 5 mg  5 mg Oral Daily Charlett Blake, MD   5 mg at 06/02/14 0800  . aspirin EC tablet 81 mg  81 mg Oral Daily Lavon Paganini Angiulli, PA-C   81 mg at 06/02/14 0759  . atorvastatin (LIPITOR) tablet 40 mg  40 mg Oral QPM Daniel J Angiulli, PA-C   40 mg at 06/01/14 1801  . ciprofloxacin (CILOXAN) 0.3 % ophthalmic solution 1 drop  1 drop Both Eyes Q4H while awake Lavon Paganini Angiulli, PA-C   1 drop at 06/02/14 0502  . feeding supplement (ENSURE ENLIVE) (ENSURE ENLIVE) liquid 237 mL  237 mL Oral TID WC Daniel J Angiulli, PA-C   237 mL at 06/02/14 0800  . finasteride (PROSCAR) tablet 5 mg  5 mg Oral Daily [name], MD   5 mg at 06/02/14 0800  . ondansetron (ZOFRAN) tablet 4 mg  4 mg Oral Q6H PRN Lavon Paganini Angiulli, PA-C       Or  . ondansetron (ZOFRAN)  injection 4 mg  4 mg Intravenous Q6H PRN Lavon Paganini Angiulli, PA-C      . oxyCODONE (Oxy IR/ROXICODONE) immediate release tablet 10 mg  10 mg Oral Q6H PRN Lavon Paganini Angiulli, PA-C   10 mg at 06/01/14 1159  . phenazopyridine (PYRIDIUM) tablet 100 mg  100 mg Oral TID WC Daniel J Angiulli, PA-C   100 mg at 06/02/14 0800  . senna-docusate (Senokot-S) tablet 2 tablet  2 tablet Oral BID Charlett Blake, MD      . sodium phosphate (FLEET) 7-19 GM/118ML enema 1 enema  1 enema Rectal Daily PRN Charlett Blake, MD      . sorbitol 70 % solution 30 mL  30 mL Oral Daily PRN Lavon Paganini Angiulli, PA-C      . sterile water for irrigation for irrigation   Irrigation Continuous [name], MD   Stopped at 06/01/14 1929  . tamsulosin (FLOMAX) capsule 0.4 mg  0.4 mg Oral QPC supper [name], MD         Objective: Vital signs in last 24 hours: Temp:  [97.8 F (36.6 C)-98.4 F (36.9 C)] 97.8  F (36.6 C) (04/19 0500) Pulse Rate:  [66] 66 (04/19 0500) BP: (122-124)/(54-63) 122/54 mmHg (04/19 0500) SpO2:  [94 %-96 %] 94 % (04/19 0500) Weight:  [112.87 kg (248 lb 13.3 oz)] 112.87 kg (248 lb 13.3 oz) (04/19 0604)  Intake/Output from previous day: 04/18 0701 - 04/19 0700 In: 3120 [P.O.:120] Out: 6650 [Urine:6650] Intake/Output this shift: Total I/O In: 240 [P.O.:240] Out: 650 [Urine:650]   Physical Exam  Lab Results:   Recent Labs  06/01/14 0819 06/02/14 0951  WBC 11.6* 10.4  HGB 11.7* 9.8*  HCT 36.1* 30.6*  PLT 276 250   BMET  Recent Labs  06/02/14 0605  CREATININE 1.14   PT/INR No results for input(s): LABPROT, INR in the last 72 hours. ABG No results for input(s): PHART, HCO3 in the last 72 hours.  Invalid input(s): PCO2, PO2  Studies/Results: Ct Abdomen Pelvis W Contrast  06/02/2014   CLINICAL DATA:  80 year old male with recent inability to void. Gross hematuria with multiple large clots. Hematuria clinically suspected to be related to anticoagulation.  EXAM: CT ABDOMEN AND  PELVIS WITH CONTRAST  TECHNIQUE: Multidetector CT imaging of the abdomen and pelvis was performed using the standard protocol following bolus administration of intravenous contrast.  CONTRAST:  167mL OMNIPAQUE IOHEXOL 300 MG/ML  SOLN  COMPARISON:  No priors.  FINDINGS: Lower chest: Small bilateral pleural effusions layering dependently.  Hepatobiliary: 7 mm low attenuation lesion in segment 2 of the liver is too small to characterize. No other larger more suspicious appearing cystic or solid hepatic lesions are noted. Tiny 5 mm high attenuation focus lying dependently in the neck of the gallbladder likely represents a tiny gallstone. No current findings to suggest an acute cholecystitis at this time. No intra or extrahepatic biliary ductal dilatation.  Pancreas: In the tail of the pancreas there is a poorly defined nodular appearing area measuring 2.4 x 2.9 cm (image 21 of series 2), concerning for potential pancreatic neoplasm. The head, uncinate process and body of the pancreas are otherwise normal in appearance. No pancreatic ductal dilatation. No peripancreatic inflammatory changes or fluid collections.  Spleen: Unremarkable.  Adrenals/Urinary Tract: Sub cm low-attenuation lesions in the kidneys bilaterally are too small to definitively characterize, but are statistically favored to represent small cysts. In addition, there are 2 simple cysts in the interpolar region and upper pole of the left kidney, measuring up to 1.9 cm. No suspicious renal lesions are noted. Focal cortical thinning in the upper pole of the right kidney likely represents post infectious/inflammatory scarring. No definite calculi are identified within the collecting system of either kidney or along the course of either ureter. No hydroureteronephrosis. Urinary bladder is completely decompressed around an indwelling Foley catheter. Bilateral adrenal glands are normal in appearance.  Stomach/Bowel: Normal appearance of the stomach. No  pathologic dilatation of small bowel or colon. Normal appendix.  Vascular/Lymphatic: Atherosclerotic disease throughout the abdominal and pelvic vasculature, without evidence of aneurysm or dissection. Circumaortic left renal vein (normal anatomical variant) incidentally noted. No lymphadenopathy noted in the abdomen or pelvis.  Reproductive: Prostate gland appears enlarged and heterogeneous in appearance, measuring up to 7.1 x 7.3 cm. Seminal vesicles are unremarkable in appearance.  Other: No significant volume of ascites.  No pneumoperitoneum.  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 No other aggressive appearing lytic or blastic lesions are noted within the visualized portions of the skeleton.  IMPRESSION: 1. No definite source for hematuria identified on today's examination. There are multiple low-attenuation lesions in the kidneys bilaterally, the largest of which are compatible with simple cysts. The smaller lesions are too small to definitively characterize by CT, but are also favored to represent small cysts. Definitive characterization could be performed with MRI of the abdomen with and without IV gadolinium if of clinical concern. 2. Ill-defined nodular area in the tail of the pancreas measuring 2.4 x 2.9 cm (image 21 of series 2), concerning for small pancreatic neoplasm. Further characterization with MRI of the abdomen with and without IV gadolinium with MRCP is recommended in the near future. 3. Heterogeneous appearing enlarged prostate gland, suggestive of BPH. 4. Small bilateral pleural effusions layering dependently. 5. Sub cm low-attenuation lesion in segment 2 of the liver is too small to characterize. Attention at time of follow-up MRI examination is recommended. 6. Cholelithiasis without evidence of acute cholecystitis at this time. 7. Additional incidental findings, as  above. These results will be called to the ordering clinician or representative by the Radiologist Assistant, and communication documented in the PACS or zVision Dashboard.   Electronically Signed   By: Vinnie Langton M.D.   On: 06/02/2014 08:28   CT films and report and recent labs reviewed.   Assessment: Gross hematuria with clot retention.  Urine now clear on no irrigation. BPH with BOO with very large prostate. Lesion of the pancreatic tail.  Plan: I will add tamsulosin to his meds and continue the finasteride. Voiding trial in am. He will need outpatient f/u for cystoscopy. He will need a GI consult for the pancreatic lesion.      LOS: 7 days    Litzy Dicker J 06/02/2014

## 2014-06-02 NOTE — Progress Notes (Signed)
Speech Language Pathology Daily Session Note  Patient Details  Name: Henry [name] MRN: [id number] Date of Birth: 1934-03-12  Today's Date: 06/02/2014 SLP Individual Time: 0803-0900 SLP Individual Time Calculation (min): 57 min  Short Term Goals: Week 1: SLP Short Term Goal 1 (Week 1): Pt will use multimodal communication to convey immediate needs/wants with max verbal and visual cues  SLP Short Term Goal 2 (Week 1): Pt will follow 2 step commands during basic, familiar ADLs/home management tasks with max assist verbal, visual, and demonstration cues.   SLP Short Term Goal 3 (Week 1): Pt will answer immediate, biographical, and/or environmental yes/no questions accurately with max assist verbal and visual cues.   SLP Short Term Goal 4 (Week 1): Pt will sustain attention to a basic familiar home mangement task for 3-5 minute intervals with mod cues verbal, visual, and tactile cues for redirection.   SLP Short Term Goal 5 (Week 1): Pt will consume dys 1 textures and thin liquids with min verbal and visual cues for use of rate and portion control as well as monitoring and correcting right sided buccal residue over 3 consecutive sessions to demonstrate readiness for diet progression.  SLP Short Term Goal 6 (Week 1): Pt will return demonstration of 2 safety precautions during basic functional tasks with max assist verbal,visual, and tactile cues.    Skilled Therapeutic Interventions:  Pt was seen for skilled ST targeting goals for dysphagia and functional communication.  SLP had ordered a trial dys 3 meal tray, which pt had already begun eating prior to SLP arrival.  Pt was noted to take large bites of dys 3 textures and required min verbal cues for rate and portion control.  Pt cleared his oral cavity of residue with supervision cues for use of liquid wash.  Recommend that pt be upgraded to dys 3 textures and thin liquids with full supervision for use of swallowing precautions.   SLP also facilitated  the session with informal functional communication tasks targeting verbal expression of basic, biographical information, immediate needs/wants, and naming for basic/concrete ideas and objects.  Pt required max assist verbal cues to count money and name coins in the setting of basic money management task due to perseveration from previous responses.  Pt was better able to convey his immediate needs and wants at the phrase level with min assist question cues to verify communicative intent.  Pt self initiated augmentation of verbal communication with gestures, pointing and other nonverbal means when presenting with difficulty word finding over ~75% of opportunities.  During functional communication with SLP, pt presented with difficulty conveying biographical information, such as his granddaughters' names and required max assist verbal cues to monitor and correct perseveration and verbal errors.  Pt left upright in bed, bed alarm set.  All needs left within reach.    FIM:  Comprehension Comprehension: 5-Understands basic 90% of the time/requires cueing < 10% of the time Expression Expression: 3-Expresses basic 50 - 74% of the time/requires cueing 25 - 50% of the time. Needs to repeat parts of sentences. Social Interaction Social Interaction: 4-Interacts appropriately 75 - 89% of the time - Needs redirection for appropriate language or to initiate interaction. Problem Solving Problem Solving: 3-Solves basic 50 - 74% of the time/requires cueing 25 - 49% of the time Memory Memory: 2-Recognizes or recalls 25 - 49% of the time/requires cueing 51 - 75% of the time FIM - Eating Eating Activity: 5: Needs verbal cues/supervision  Pain Pain Assessment Pain Assessment: No/denies pain  Therapy/Group: Individual Therapy  Ontario Pettengill, Selinda Orion 06/02/2014, 12:29 PM

## 2014-06-02 NOTE — Progress Notes (Signed)
Subjective/Complaints: Appreciate urology consult.  Pt comfortable.  Ate small breakfast, starting to work with SLP    ROS - cannot obtain due to Aphasia Objective: Vital Signs: Blood pressure 122/54, pulse 66, temperature 97.8 F (36.6 C), temperature source Oral, resp. rate 20, weight 112.87 kg (248 lb 13.3 oz), SpO2 94 %. No results found. Results for orders placed or performed during the hospital encounter of 05/26/14 (from the past 72 hour(s))  Urinalysis, Routine w reflex microscopic     Status: Abnormal   Collection Time: 06/01/14  5:45 AM  Result Value Ref Range   Color, Urine YELLOW YELLOW   APPearance CLOUDY (A) CLEAR   Specific Gravity, Urine 1.009 1.005 - 1.030   pH 7.0 5.0 - 8.0   Glucose, UA NEGATIVE NEGATIVE mg/dL   Hgb urine dipstick LARGE (A) NEGATIVE   Bilirubin Urine NEGATIVE NEGATIVE   Ketones, ur NEGATIVE NEGATIVE mg/dL   Protein, ur NEGATIVE NEGATIVE mg/dL   Urobilinogen, UA 0.2 0.0 - 1.0 mg/dL   Nitrite NEGATIVE NEGATIVE   Leukocytes, UA NEGATIVE NEGATIVE  Urine microscopic-add on     Status: None   Collection Time: 06/01/14  5:45 AM  Result Value Ref Range   Squamous Epithelial / LPF RARE RARE   RBC / HPF TOO NUMEROUS TO COUNT <3 RBC/hpf   Bacteria, UA RARE RARE  CBC     Status: Abnormal   Collection Time: 06/01/14  8:19 AM  Result Value Ref Range   WBC 11.6 (H) 4.0 - 10.5 K/uL   RBC 4.19 (L) 4.22 - 5.81 MIL/uL   Hemoglobin 11.7 (L) 13.0 - 17.0 g/dL   HCT 36.1 (L) 39.0 - 52.0 %   MCV 86.2 78.0 - 100.0 fL   MCH 27.9 26.0 - 34.0 pg   MCHC 32.4 30.0 - 36.0 g/dL   RDW 14.1 11.5 - 15.5 %   Platelets 276 150 - 400 K/uL  Creatinine, serum     Status: Abnormal   Collection Time: 06/02/14  6:05 AM  Result Value Ref Range   Creatinine, Ser 1.14 0.50 - 1.35 mg/dL   GFR calc non Af Amer 60 (L) >90 mL/min   GFR calc Af Amer 70 (L) >90 mL/min    Comment: (NOTE) The eGFR has been calculated using the CKD EPI equation. This calculation has not been validated  in all clinical situations. eGFR's persistently <90 mL/min signify possible Chronic Kidney Disease.      HEENT: normal Cardio: RRR and no murmur Resp: CTA B/L GI: BS positive, +Distention and no tenderness Extremity:  Edema pedal Skin:   Bruise multiple UE Neuro: Alert/Oriented, , Abnormal Motor 4/5 in the right deltoid, biceps, triceps, grip, hip flexor, knee extensor and ankle dorsiflexor, Aphasic and Apraxic Musc/Skel:  Other no pain with upper extremity or lower extremity active range of motion Gen. no acute distress Standing balance fair  Assessment/Plan: 1. Functional deficits secondary to left MCA distribution infarct with right hemiparesis and aphasia as well as dysphagia  which require 3+ hours per day of interdisciplinary therapy in a comprehensive inpatient rehab setting. Physiatrist is providing close team supervision and 24 hour management of active medical problems listed below. Physiatrist and rehab team continue to assess barriers to discharge/monitor patient progress toward functional and medical goals. FIM: FIM - Bathing Bathing Steps Patient Completed: Chest, Left lower leg (including foot), Right Arm, Left Arm, Abdomen, Front perineal area, Buttocks, Right upper leg, Left upper leg, Right lower leg (including foot) Bathing: 5: Supervision: Safety issues/verbal  cues  FIM - Upper Body Dressing/Undressing Upper body dressing/undressing steps patient completed: Thread/unthread right sleeve of pullover shirt/dresss, Pull shirt over trunk, Thread/unthread left sleeve of pullover shirt/dress, Put head through opening of pull over shirt/dress Upper body dressing/undressing: 5: Supervision: Safety issues/verbal cues FIM - Lower Body Dressing/Undressing Lower body dressing/undressing steps patient completed: Thread/unthread right underwear leg, Fasten/unfasten pants, Fasten/unfasten left shoe, Thread/unthread left underwear leg, Pull underwear up/down, Thread/unthread right  pants leg, Thread/unthread left pants leg, Pull pants up/down, Fasten/unfasten right shoe, Don/Doff left shoe, Don/Doff right shoe Lower body dressing/undressing: 4: Steadying Assist  FIM - Toileting Toileting steps completed by patient: Adjust clothing prior to toileting, Performs perineal hygiene, Adjust clothing after toileting Toileting Assistive Devices: Grab bar or rail for support Toileting: 5: Supervision: Safety issues/verbal cues  FIM - Radio producer Devices: Product manager Transfers: 5-To toilet/BSC: Supervision (verbal cues/safety issues), 5-From toilet/BSC: Supervision (verbal cues/safety issues)  FIM - Control and instrumentation engineer Devices: Arm rests Bed/Chair Transfer: 4: Chair or W/C > Bed: Min A (steadying Pt. > 75%), 4: Bed > Chair or W/C: Min A (steadying Pt. > 75%)  FIM - Locomotion: Wheelchair Locomotion: Wheelchair: 1: Total Assistance/staff pushes wheelchair (Pt<25%) FIM - Locomotion: Ambulation Locomotion: Ambulation Assistive Devices: Other (comment) (hand hold assistance) Ambulation/Gait Assistance: 4: Min guard Locomotion: Ambulation: 2: Travels 50 - 149 ft with minimal assistance (Pt.>75%)  Comprehension Comprehension Mode: Asleep Comprehension: 5-Understands basic 90% of the time/requires cueing < 10% of the time  Expression Expression Mode: Asleep Expression: 3-Expresses basic 50 - 74% of the time/requires cueing 25 - 50% of the time. Needs to repeat parts of sentences.  Social Interaction Social Interaction Mode: Asleep Social Interaction: 4-Interacts appropriately 75 - 89% of the time - Needs redirection for appropriate language or to initiate interaction.  Problem Solving Problem Solving Mode: Asleep Problem Solving: 3-Solves basic 50 - 74% of the time/requires cueing 25 - 49% of the time  Memory Memory Mode: Asleep Memory: 2-Recognizes or recalls 25 - 49% of the time/requires cueing 51 - 75%  of the time  Medical Problem List and Plan: 1. Functional deficits secondary to left MCA territory infarct with ICA occlusion status post mechanical thrombectomy with left ICA stenting, embolic secondary to large vessel atherosclerosis 2.  DVT Prophylaxis/Anticoagulation: Subcutaneous Lovenox. Monitor platelet counts and any signs of bleeding. Venous Doppler studies 05/25/2014 negative 3. Pain Management: Tylenol as needed 4. Dysphagia. Dysphagia 1 thin liquid diet. Follow-up speech therapy 5. Neuropsych: This patient is capable of making decisions on his own behalf.  -will schedule trazodone for sleep 6. Skin/Wound Care: Routine skin checks 7. Fluids/Electrolytes/Nutrition: encourage po 8. Hyperlipidemia. Lipitor 9. Hypertension. Allowing for permissive hypertension. No present antihypertensive medication. Patient on Norvasc 5 mg daily, hydrochlorothiazide 25 mg daily prior to admission. Resumed Norvasc as Systolic BP creeping up 10. GERD. Protonix 11. Wheezing: does not appear uncomfortable, sats ok. Afebrile. No cough. Keep an eye on weights  -continue albuteraol neb to tid 12.  Urinary retention, with hematuria, clearing with CBI, CT abd pending, repeat CBC , last Hgb ok 13.  Abd distention, scout films for CT abd showed dilated bowel loops, add senna, Fleet enema LOS (Days) 7 A FACE TO FACE EVALUATION WAS PERFORMED  KIRSTEINS,ANDREW E 06/02/2014, 8:09 AM

## 2014-06-02 NOTE — Progress Notes (Signed)
Notified MD, Kirsteins of CT results. MD will follow up with urology.

## 2014-06-02 NOTE — Progress Notes (Signed)
Occupational Therapy Session Note  Patient Details  Name: Henry [name] MRN: [id number] Date of Birth: 1935/01/01  Today's Date: 06/02/2014 OT Individual Time: 0901-1001 OT Individual Time Calculation (min): 60 min    Short Term Goals: Week 1:  OT Short Term Goal 1 (Week 1): Pt will complete bathing with supervision with <25% cues OT Short Term Goal 2 (Week 1): Pt will complete LB dressing with min assist OT Short Term Goal 3 (Week 1): Pt will complete grooming in standing with correct identification of objects with <25% cues OT Short Term Goal 4 (Week 1): Pt will complete toilet transfers with supervision  Skilled Therapeutic Interventions/Progress Updates:    Pt performed bathing and dressing sit to stand EOB.  He currently maintains internal catheter with irrigation tube as well but does not report pain at rest.  Noted pt with increased pain however when attempting to reach down to his feet or bring them up for bathing or dressing.  Max assist needed for washing his feet as well as donning socks and shoes.  He was able to sequence through bathing with min instructional cueing.  Needed mod cueing on one occasion to not stand up and to go ahead and wash his legs first.  Easily re-directed and pleasant.  Attempted donning RLE in pants in standing so therapist needed to give him mod hand held assist to support him while performing this.  Supervision for completion of grooming tasks standing at the sink.  Pt left in recliner with safety belt in place.  Therapy Documentation Precautions:  Precautions Precautions: Fall Precaution Comments: h/o dizziness in standing per family (?vestibular versus orthostatic issues), wife stated he has vertigo occasionally Restrictions Weight Bearing Restrictions: No  Pain: Pain Assessment Pain Assessment: No/denies pain ADL: See FIM for current functional status  Therapy/Group: Individual Therapy  Dawnya Grams OTR/L 06/02/2014, 12:33 PM

## 2014-06-02 NOTE — Progress Notes (Addendum)
Patient woke up at 4 am, RN offered sprite to drink which patient had couple of sips. Dark orange colored urine with small clots in the drainage bag. Patient made comfortable in bed and went back to sleep.Denies pain. No active bleeding on his meatus. Continue to monitor.

## 2014-06-02 NOTE — Progress Notes (Addendum)
Physical Therapy Session Note  Patient Details  Name: Henry [name] MRN: [id number] Date of Birth: 1934/02/28  Today's Date: 06/02/2014 PT Individual Time: 1305-1340 PT Individual Time Calculation (min): 35 min   Short Term Goals: Week 1:  PT Short Term Goal 1 (Week 1): pt will perform all bed mobility with supervision PT Short Term Goal 2 (Week 1): pt will transfer safely with min assist of 1 person 5/5 trials PT Short Term Goal 3 (Week 1): pt will perform gait x 150' with LRAD with min guard assist PT Short Term Goal 4 (Week 1): pt will ascend/descend 5 stairs 2 rails safely wtih min assist PT Short Term Goal 5 (Week 1): pt will route find gym>< room with max multi-modal cues Week 2:     Skilled Therapeutic Interventions/Progress Updates:  Gait to/from room to gym pushing IV pole with L hand,  min guard assist. Pt required 0 cues for route finding.  Up/down curb with min assist, cues for sequence, x 2.  [place] A exs, 10 x 1 each: standing mini squats, hamstring curls, calf raises, toe raises, hip abduction; sitting R and L long arc quad knee ext with 3# ,ankles. Pt with poor excursion R hip abduction.   Pt returned to room, resting in recliner with quick release belt on, all needs in place.   Pt issued hand- out for HEP; plan to instruct pt's wife.    Therapy Documentation Precautions:  Precautions Precautions: Fall Precaution Comments: h/o dizziness in standing per family (?vestibular versus orthostatic issues), wife stated he has vertigo occasionally Restrictions Weight Bearing Restrictions: No Locomotion : Ambulation Ambulation/Gait Assistance: 4: Min guard     See FIM for current functional status  Therapy/Group: Individual Therapy  Adream Parzych 06/02/2014, 4:34 PM

## 2014-06-02 NOTE — Progress Notes (Signed)
Physical Therapy Session Note  Patient Details  Name: Henry [name] MRN: [id number] Date of Birth: [birth year]  Today's Date: 06/02/2014 PT Individual Time: 1100-1200 PT Individual Time Calculation (min): 60 min   Short Term Goals: Week 1:  PT Short Term Goal 1 (Week 1): pt will perform all bed mobility with supervision PT Short Term Goal 2 (Week 1): pt will transfer safely with min assist of 1 person 5/5 trials PT Short Term Goal 3 (Week 1): pt will perform gait x 150' with LRAD with min guard assist PT Short Term Goal 4 (Week 1): pt will ascend/descend 5 stairs 2 rails safely wtih min assist PT Short Term Goal 5 (Week 1): pt will route find gym>< room with max multi-modal cues Week 2:     Skilled Therapeutic Interventions/Progress Updates:  Pt participated in community gait and mobility, flkoor transfer demonstration, and functional balance training. Pt performed community ambulation 2x>500' with no device and close S over varying and uneven surfaces and inclines overall. Pt needed up to Min-guard A at times due to catheter weighing pants without belt. Pt not limited by activity tolerance. Pt able to navigate busy settings and tight spaces with S and was able to wayfind to/from unit with min cues for directions and problem solving elevator buttons. Discussed typical community access and trouble shooting possible difficulties. Performed transfers from varying surfaces and furniture with Min-guard/S.   PT demonstrated floor transfer, but pt uncomfortable due to catheter, not wanting to attempt at this time. They verbalized understanding.   Stair training x5 with bil rails and S cues for safety, pt needing up to min A due to LOB to R at end of task in busy setting.   Pt left up in [facility] with wife, advised not to assist pt while he has lines/tubes.      Therapy Documentation Precautions:  Precautions Precautions: Fall Precaution Comments: h/o dizziness in standing per family (?vestibular  versus orthostatic issues), wife stated he has vertigo occasionally Restrictions Weight Bearing Restrictions: No General:   Vital Signs:  Pain: Pain Assessment Pain Assessment: No/denies pain  See FIM for current functional status  Therapy/Group: Individual Therapy Kennieth Rad, PT, DPT  Dwaine Deter, Landry Mellow M 06/02/2014, 11:45 AM

## 2014-06-03 ENCOUNTER — Inpatient Hospital Stay (HOSPITAL_COMMUNITY): Payer: Medicare Other

## 2014-06-03 ENCOUNTER — Inpatient Hospital Stay (HOSPITAL_COMMUNITY): Payer: Medicare Other | Admitting: Occupational Therapy

## 2014-06-03 ENCOUNTER — Inpatient Hospital Stay (HOSPITAL_COMMUNITY): Payer: Medicare Other | Admitting: Speech Pathology

## 2014-06-03 LAB — GLUCOSE, CAPILLARY: Glucose-Capillary: 104 mg/dL — ABNORMAL HIGH (ref 70–99)

## 2014-06-03 MED ORDER — CIPROFLOXACIN HCL 250 MG PO TABS: 250.0000 mg | ORAL_TABLET | Freq: Two times a day (BID) | ORAL | Status: AC

## 2014-06-03 MED ORDER — FINASTERIDE 5 MG PO TABS: 5.0000 mg | ORAL_TABLET | Freq: Every day | ORAL | Status: DC

## 2014-06-03 MED ORDER — TAMSULOSIN HCL 0.4 MG PO CAPS: 0.4000 mg | ORAL_CAPSULE | Freq: Every day | ORAL | Status: DC

## 2014-06-03 MED ORDER — LIDOCAINE HCL 2 % EX GEL
CUTANEOUS | Status: DC | PRN
  Filled 2014-06-03 (×2): qty 5

## 2014-06-03 NOTE — Progress Notes (Signed)
Patient ID: Henry [name], male   DOB: September 16, 1934, 80 y.o.   MRN: [id number]    Subjective: The foley was removed this morning but he was only able to void about 53ml and was uncomfortable.   He was cathed for 421ml.  ROS:  Review of Systems  Unable to perform ROS: mental acuity    Anti-infectives: Anti-infectives    None      Current Facility-Administered Medications  Medication Dose Route Frequency Provider Last Rate Last Dose  . acetaminophen (TYLENOL) tablet 1,000 mg  1,000 mg Oral Q6H PRN Lavon Paganini Angiulli, PA-C   1,000 mg at 06/01/14 3007   Or  . acetaminophen (TYLENOL) suppository 650 mg  650 mg Rectal Q6H PRN Lavon Paganini Angiulli, PA-C      . albuterol (PROVENTIL) (2.5 MG/3ML) 0.083% nebulizer solution 2.5 mg  2.5 mg Nebulization Q6H PRN Charlett Blake, MD      . ALPRAZolam Duanne Moron) tablet 0.5 mg  0.5 mg Oral TID PRN Lavon Paganini Angiulli, PA-C   0.5 mg at 06/01/14 1046  . amLODipine (NORVASC) tablet 5 mg  5 mg Oral Daily Charlett Blake, MD   5 mg at 06/03/14 6226  . aspirin EC tablet 81 mg  81 mg Oral Daily Lavon Paganini Angiulli, PA-C   81 mg at 06/03/14 3335  . atorvastatin (LIPITOR) tablet 40 mg  40 mg Oral QPM Daniel J Angiulli, PA-C   40 mg at 06/03/14 1752  . feeding supplement (ENSURE ENLIVE) (ENSURE ENLIVE) liquid 237 mL  237 mL Oral TID WC Daniel J Angiulli, PA-C   237 mL at 06/02/14 1216  . finasteride (PROSCAR) tablet 5 mg  5 mg Oral Daily [name], MD   5 mg at 06/03/14 4562  . lidocaine (XYLOCAINE) 2 % jelly   Topical PRN Lavon Paganini Angiulli, PA-C      . ondansetron [facility]) tablet 4 mg  4 mg Oral Q6H PRN Lavon Paganini Angiulli, PA-C       Or  . ondansetron (ZOFRAN) injection 4 mg  4 mg Intravenous Q6H PRN Daniel J Angiulli, PA-C      . oxyCODONE (Oxy IR/ROXICODONE) immediate release tablet 10 mg  10 mg Oral Q6H PRN Lavon Paganini Angiulli, PA-C   10 mg at 06/01/14 1159  . phenazopyridine (PYRIDIUM) tablet 100 mg  100 mg Oral TID WC Daniel J Angiulli, PA-C   100 mg at 06/03/14  1752  . senna-docusate (Senokot-S) tablet 2 tablet  2 tablet Oral BID Charlett Blake, MD   2 tablet at 06/03/14 5638  . sodium phosphate (FLEET) 7-19 GM/118ML enema 1 enema  1 enema Rectal Daily PRN Charlett Blake, MD      . sorbitol 70 % solution 30 mL  30 mL Oral Daily PRN Lavon Paganini Angiulli, PA-C      . sterile water for irrigation for irrigation   Irrigation Continuous [name], MD   Stopped at 06/01/14 1929  . tamsulosin (FLOMAX) capsule 0.4 mg  0.4 mg Oral QPC supper [name], MD   0.4 mg at 06/03/14 1752     Objective: Vital signs in last 24 hours: Temp:  [97.2 F (36.2 C)-98.3 F (36.8 C)] 98.3 F (36.8 C) (04/20 1430) Pulse Rate:  [70-71] 70 (04/20 1430) Resp:  [17-18] 18 (04/20 1430) BP: (109-119)/(62-73) 119/73 mmHg (04/20 1430) SpO2:  [97 %-99 %] 99 % (04/20 1430) Weight:  [114.21 kg (251 lb 12.6 oz)] 114.21 kg (251 lb 12.6 oz) (04/20 0513)  Intake/Output from previous day: 04/19 0701 - 04/20 0700 In: 49 [P.O.:580] Out: 2550 [Urine:2550] Intake/Output this shift: Total I/O In: 840 [P.O.:840] Out: 450 [Urine:450]   Physical Exam  Constitutional: He is well-developed, well-nourished, and in no distress.  Neurological: He is alert.  aphasic    Lab Results:   Recent Labs  06/01/14 0819 06/02/14 0951  WBC 11.6* 10.4  HGB 11.7* 9.8*  HCT 36.1* 30.6*  PLT 276 250   BMET  Recent Labs  06/02/14 0605  CREATININE 1.14   PT/INR No results for input(s): LABPROT, INR in the last 72 hours. ABG No results for input(s): PHART, HCO3 in the last 72 hours.  Invalid input(s): PCO2, PO2  Studies/Results: Ct Abdomen Pelvis W Contrast  06/02/2014   CLINICAL DATA:  80 year old male with recent inability to void. Gross hematuria with multiple large clots. Hematuria clinically suspected to be related to anticoagulation.  EXAM: CT ABDOMEN AND PELVIS WITH CONTRAST  TECHNIQUE: Multidetector CT imaging of the abdomen and pelvis was performed using the standard  protocol following bolus administration of intravenous contrast.  CONTRAST:  179mL OMNIPAQUE IOHEXOL 300 MG/ML  SOLN  COMPARISON:  No priors.  FINDINGS: Lower chest: Small bilateral pleural effusions layering dependently.  Hepatobiliary: 7 mm low attenuation lesion in segment 2 of the liver is too small to characterize. No other larger more suspicious appearing cystic or solid hepatic lesions are noted. Tiny 5 mm high attenuation focus lying dependently in the neck of the gallbladder likely represents a tiny gallstone. No current findings to suggest an acute cholecystitis at this time. No intra or extrahepatic biliary ductal dilatation.  Pancreas: In the tail of the pancreas there is a poorly defined nodular appearing area measuring 2.4 x 2.9 cm (image 21 of series 2), concerning for potential pancreatic neoplasm. The head, uncinate process and body of the pancreas are otherwise normal in appearance. No pancreatic ductal dilatation. No peripancreatic inflammatory changes or fluid collections.  Spleen: Unremarkable.  Adrenals/Urinary Tract: Sub cm low-attenuation lesions in the kidneys bilaterally are too small to definitively characterize, but are statistically favored to represent small cysts. In addition, there are 2 simple cysts in the interpolar region and upper pole of the left kidney, measuring up to 1.9 cm. No suspicious renal lesions are noted. Focal cortical thinning in the upper pole of the right kidney likely represents post infectious/inflammatory scarring. No definite calculi are identified within the collecting system of either kidney or along the course of either ureter. No hydroureteronephrosis. Urinary bladder is completely decompressed around an indwelling Foley catheter. Bilateral adrenal glands are normal in appearance.  Stomach/Bowel: Normal appearance of the stomach. No pathologic dilatation of small bowel or colon. Normal appendix.  Vascular/Lymphatic: Atherosclerotic disease throughout the  abdominal and pelvic vasculature, without evidence of aneurysm or dissection. Circumaortic left renal vein (normal anatomical variant) incidentally noted. No lymphadenopathy noted in the abdomen or pelvis.  Reproductive: Prostate gland appears enlarged and heterogeneous in appearance, measuring up to 7.1 x 7.3 cm. Seminal vesicles are unremarkable in appearance.  Other: No significant volume of ascites.  No pneumoperitoneum.  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 No other aggressive appearing lytic or blastic lesions are noted within the visualized portions of the skeleton.  IMPRESSION: 1. No definite source for hematuria identified on today's examination. There are multiple low-attenuation lesions in the kidneys bilaterally, the largest of which are  compatible with simple cysts. The smaller lesions are too small to definitively characterize by CT, but are also favored to represent small cysts. Definitive characterization could be performed with MRI of the abdomen with and without IV gadolinium if of clinical concern. 2. Ill-defined nodular area in the tail of the pancreas measuring 2.4 x 2.9 cm (image 21 of series 2), concerning for small pancreatic neoplasm. Further characterization with MRI of the abdomen with and without IV gadolinium with MRCP is recommended in the near future. 3. Heterogeneous appearing enlarged prostate gland, suggestive of BPH. 4. Small bilateral pleural effusions layering dependently. 5. Sub cm low-attenuation lesion in segment 2 of the liver is too small to characterize. Attention at time of follow-up MRI examination is recommended. 6. Cholelithiasis without evidence of acute cholecystitis at this time. 7. Additional incidental findings, as above. These results will be called to the ordering clinician or representative by the Radiologist Assistant, and  communication documented in the PACS or zVision Dashboard.   Electronically Signed   By: Vinnie Langton M.D.   On: 06/02/2014 08:28     Assessment: He failed his voiding trial today.   Plan: He needs to have an 78fr coude foley placed and can have a leg bag for convenience. He will need f/u in my office in 2-3 weeks for reevaluation and probable cystoscopy.  Continue the finasteride.   He should be given a script for tamsulosin and discharge but not start it until 3 days prior to f/u. He should be given Cipro 250mg  po bid #6 to start 3 days prior to his f/u as well.      LOS: 8 days    Zarif Rathje J 06/03/2014

## 2014-06-03 NOTE — Progress Notes (Signed)
Since foley removal at 0530 pt has voided one unknown amount on toilet and one time in urinal 25cc. Pt has had urge throughout afternoon with no result. Pt has attempted multiple times with bladder scans done. Pt bladder scan for 571cc at 1415 with no result of urine output. Pt I&O cath for 425cc. Urine dark yellow/orange with a few blood clots present. Notified PA, [place], with results.

## 2014-06-03 NOTE — Progress Notes (Signed)
Physical Therapy Session Note  Patient Details  Name: Henry [name] MRN: [id number] Date of Birth: 07/07/1934  Today's Date: 06/03/2014 PT Individual Time: 0800-0900 PT Individual Time Calculation (min): 60 min   Short Term Goals: Week 1:  PT Short Term Goal 1 (Week 1): pt will perform all bed mobility with supervision PT Short Term Goal 2 (Week 1): pt will transfer safely with min assist of 1 person 5/5 trials PT Short Term Goal 3 (Week 1): pt will perform gait x 150' with LRAD with min guard assist PT Short Term Goal 4 (Week 1): pt will ascend/descend 5 stairs 2 rails safely wtih min assist PT Short Term Goal 5 (Week 1): pt will route find gym>< room with max multi-modal cues Week 2:     Skilled Therapeutic Interventions/Progress Updates:   Foley has been removed. Pt donned shoes with assistance; dyspnea 3/4 while bending over in sitting to do this.  Gait to/from gym to room without cues for route finding, close supervision. Retrieval of weights from floor, transporting x 10' including turns to R and L, and hanging up on peg board, with close supervision, without LOB. Up/down flight of stairs with L rail ascending, step through method, and descending with bil rails, step to> step through method- all self selected due to communication difficulties.  Activity tolerance: nustep at level 4 x 8 minutes, rated 12 on Borg scale. After 8 min, HR = 82; O2 sats 96% on RA.  neuromuscular re-education for balance strategies in standing on wedge, standing on compliant [employer] mat, with external perturbations on hard floor.  Pt without LOB on wedge, continual mild LOB on mat with independent recovery.  Pt demonstrated ankle and hip strategies during external perturbations.   Dual task activity in unsupported sitting with L hand behind back, then standing biased toward R.: sorting playing cards by suit using R hand only.  Pt needed max cueing initially to attend to different suits, tapering to min  assist for accuracy.  Pt's speed of sorting did not increase with repetition of 52 card deck; he continued to visually match each card to a suit and hesitate with each card.    Floor transfer to/from mat with supervision, safely after 1 demo.  Pt left resting in recliner in room, quick release belt in place.  PT confirmed with RN that pt is safe to be left in room alone now.    Therapy Documentation Precautions:  Precautions Precautions: Fall Precaution Comments: h/o dizziness in standing per family (?vestibular versus orthostatic issues), wife stated he has vertigo occasionally Restrictions Weight Bearing Restrictions: No  Pain: pt reported no pain Pain Assessment Pain Assessment: No/denies pain    See FIM for current functional status  Therapy/Group: Individual Therapy  Fabion Gatson 06/03/2014, 4:42 PM

## 2014-06-03 NOTE — Progress Notes (Signed)
Occupational Therapy Weekly Progress Note  Patient Details  Name: Henry [name] MRN: [id number] Date of Birth: [birth year]  Beginning of progress report period: May 27, 2014 End of progress report period: June 03, 2011  Today's Date: 06/03/2014 OT Individual Time: 1443-1540 OT Individual Time Calculation (min): 72 min    Patient has met 2 of 4 short term goals.  He continues to demonstrate slight balance difficulty and risk of fall with quick turns.  During selfcare task noted need for min assist on one occasion secondary to LOB to the right.  Henry [name] needs mod instructional cueing to safely sequence through selfcare tasks and grooming.  At times forgets that he's already attempted tasks and attempts to repeat them.  He continues to demonstrate limited awareness for safety as well as decreased visual processing.  Receptive and expressive difficulty noted at well.  Feel he is on target for supervision level goals.  Will need family education prior to discharge so that spouse and family can understand his current limitations in safety, balance, awareness, and visual processing.     Patient continues to demonstrate the following deficits: decreased balance, decreased awareness, decreased visual processing, and therefore will continue to benefit from skilled OT intervention to enhance overall performance with BADL.  Patient progressing toward long term goals..  Continue plan of care.  OT Short Term Goals Week 2:  OT Short Term Goal 1 (Week 2): Continue to work toward established supervision level goals.  Skilled Therapeutic Interventions/Progress Updates:    Pt performed bathing and dressing during session.  He needed mod instructional cueing to sequence through undressing and for bathing task.  He was able to stand for most of shower but demonstrated decreased thoroughness and did not attempt to wash his lower legs.  Max instructional cueing to have him dry his LEs completely to make  donning TEDs easier.  He ambulated out to the sink for grooming tasks, attempting to brush his teeth X 2, as he forgot that he did it earlier.  On second attempt he tried to apply barrier cream to the toothbrush but therapist intervened.  All dressing completed at supervision level sit to stand except for TEDs.  Second part of session focused on vision.  Pt with occular ROM WFLs but tracking horizontally was also jerky.  No convergence noted during this session.  Peripheral fields and saccades were WFLS.  Pt only 50% accurate when attempting to identify fingers in various fields but may be related to verbal expression difficulties.  Pt unable to perform clock design without max demonstrational cueing.  He could reproduce house design with the same details but needed 2 attempts, and added in lines that were not present in the model.  Pt left in recliner at end of session with waist belt in place.    Therapy Documentation Precautions:  Precautions Precautions: Fall Precaution Comments: h/o dizziness in standing per family (?vestibular versus orthostatic issues), wife stated he has vertigo occasionally Restrictions Weight Bearing Restrictions: No  Pain: Pain Assessment Pain Assessment: No/denies pain ADL: See FIM for current functional status  Therapy/Group: Individual Therapy  Chistine Dematteo OTR/L 06/03/2014, 10:43 AM

## 2014-06-03 NOTE — Progress Notes (Signed)
Subjective/Complaints: CBI discontinued per pt voided but no RN documentation of this   ROS - cannot obtain due to Aphasia Objective: Vital Signs: Blood pressure 109/62, pulse 71, temperature 97.2 F (36.2 C), temperature source Oral, resp. rate 17, weight 114.21 kg (251 lb 12.6 oz), SpO2 97 %. Ct Abdomen Pelvis W Contrast  06/02/2014   CLINICAL DATA:  80 year old male with recent inability to void. Gross hematuria with multiple large clots. Hematuria clinically suspected to be related to anticoagulation.  EXAM: CT ABDOMEN AND PELVIS WITH CONTRAST  TECHNIQUE: Multidetector CT imaging of the abdomen and pelvis was performed using the standard protocol following bolus administration of intravenous contrast.  CONTRAST:  165m OMNIPAQUE IOHEXOL 300 MG/ML  SOLN  COMPARISON:  No priors.  FINDINGS: Lower chest: Small bilateral pleural effusions layering dependently.  Hepatobiliary: 7 mm low attenuation lesion in segment 2 of the liver is too small to characterize. No other larger more suspicious appearing cystic or solid hepatic lesions are noted. Tiny 5 mm high attenuation focus lying dependently in the neck of the gallbladder likely represents a tiny gallstone. No current findings to suggest an acute cholecystitis at this time. No intra or extrahepatic biliary ductal dilatation.  Pancreas: In the tail of the pancreas there is a poorly defined nodular appearing area measuring 2.4 x 2.9 cm (image 21 of series 2), concerning for potential pancreatic neoplasm. The head, uncinate process and body of the pancreas are otherwise normal in appearance. No pancreatic ductal dilatation. No peripancreatic inflammatory changes or fluid collections.  Spleen: Unremarkable.  Adrenals/Urinary Tract: Sub cm low-attenuation lesions in the kidneys bilaterally are too small to definitively characterize, but are statistically favored to represent small cysts. In addition, there are 2 simple cysts in the interpolar region and upper  pole of the left kidney, measuring up to 1.9 cm. No suspicious renal lesions are noted. Focal cortical thinning in the upper pole of the right kidney likely represents post infectious/inflammatory scarring. No definite calculi are identified within the collecting system of either kidney or along the course of either ureter. No hydroureteronephrosis. Urinary bladder is completely decompressed around an indwelling Foley catheter. Bilateral adrenal glands are normal in appearance.  Stomach/Bowel: Normal appearance of the stomach. No pathologic dilatation of small bowel or colon. Normal appendix.  Vascular/Lymphatic: Atherosclerotic disease throughout the abdominal and pelvic vasculature, without evidence of aneurysm or dissection. Circumaortic left renal vein (normal anatomical variant) incidentally noted. No lymphadenopathy noted in the abdomen or pelvis.  Reproductive: Prostate gland appears enlarged and heterogeneous in appearance, measuring up to 7.1 x 7.3 cm. Seminal vesicles are unremarkable in appearance.  Other: No significant volume of ascites.  No pneumoperitoneum.  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 No other aggressive appearing lytic or blastic lesions are noted within the visualized portions of the skeleton.  IMPRESSION: 1. No definite source for hematuria identified on today's examination. There are multiple low-attenuation lesions in the kidneys bilaterally, the largest of which are compatible with simple cysts. The smaller lesions are too small to definitively characterize by CT, but are also favored to represent small cysts. Definitive characterization could be performed with MRI of the abdomen with and without IV gadolinium if of clinical concern. 2. Ill-defined nodular area in the tail of the pancreas measuring 2.4 x 2.9 cm (image 21 of series 2), concerning for small  pancreatic neoplasm. Further characterization with MRI of  the abdomen with and without IV gadolinium with MRCP is recommended in the near future. 3. Heterogeneous appearing enlarged prostate gland, suggestive of BPH. 4. Small bilateral pleural effusions layering dependently. 5. Sub cm low-attenuation lesion in segment 2 of the liver is too small to characterize. Attention at time of follow-up MRI examination is recommended. 6. Cholelithiasis without evidence of acute cholecystitis at this time. 7. Additional incidental findings, as above. These results will be called to the ordering clinician or representative by the Radiologist Assistant, and communication documented in the PACS or zVision Dashboard.   Electronically Signed   By: Vinnie Langton M.D.   On: 06/02/2014 08:28   Results for orders placed or performed during the hospital encounter of 05/26/14 (from the past 72 hour(s))  Urinalysis, Routine w reflex microscopic     Status: Abnormal   Collection Time: 06/01/14  5:45 AM  Result Value Ref Range   Color, Urine YELLOW YELLOW   APPearance CLOUDY (A) CLEAR   Specific Gravity, Urine 1.009 1.005 - 1.030   pH 7.0 5.0 - 8.0   Glucose, UA NEGATIVE NEGATIVE mg/dL   Hgb urine dipstick LARGE (A) NEGATIVE   Bilirubin Urine NEGATIVE NEGATIVE   Ketones, ur NEGATIVE NEGATIVE mg/dL   Protein, ur NEGATIVE NEGATIVE mg/dL   Urobilinogen, UA 0.2 0.0 - 1.0 mg/dL   Nitrite NEGATIVE NEGATIVE   Leukocytes, UA NEGATIVE NEGATIVE  Urine microscopic-add on     Status: None   Collection Time: 06/01/14  5:45 AM  Result Value Ref Range   Squamous Epithelial / LPF RARE RARE   RBC / HPF TOO NUMEROUS TO COUNT <3 RBC/hpf   Bacteria, UA RARE RARE  Culture, Urine     Status: None   Collection Time: 06/01/14  5:54 AM  Result Value Ref Range   Specimen Description URINE, CATHETERIZED    Special Requests NONE    Colony Count NO GROWTH Performed at [facility]     Culture NO GROWTH Performed at [facility]     Report Status 06/02/2014 FINAL   CBC     Status: Abnormal   Collection Time: 06/01/14  8:19 AM  Result Value Ref Range   WBC 11.6 (H) 4.0 - 10.5 K/uL   RBC 4.19 (L) 4.22 - 5.81 MIL/uL   Hemoglobin 11.7 (L) 13.0 - 17.0 g/dL   HCT 36.1 (L) 39.0 - 52.0 %   MCV 86.2 78.0 - 100.0 fL   MCH 27.9 26.0 - 34.0 pg   MCHC 32.4 30.0 - 36.0 g/dL   RDW 14.1 11.5 - 15.5 %   Platelets 276 150 - 400 K/uL  Creatinine, serum     Status: Abnormal   Collection Time: 06/02/14  6:05 AM  Result Value Ref Range   Creatinine, Ser 1.14 0.50 - 1.35 mg/dL   GFR calc non Af Amer 60 (L) >90 mL/min   GFR calc Af Amer 70 (L) >90 mL/min    Comment: (NOTE) The eGFR has been calculated using the CKD EPI equation. This calculation has not been validated in all clinical situations. eGFR's persistently <90 mL/min signify possible Chronic Kidney Disease.   CBC     Status: Abnormal   Collection Time: 06/02/14  9:51 AM  Result Value Ref Range   WBC 10.4 4.0 - 10.5 K/uL   RBC 3.48 (L) 4.22 - 5.81 MIL/uL   Hemoglobin 9.8 (L) 13.0 - 17.0 g/dL   HCT 30.6 (L) 39.0 - 52.0 %   MCV 87.9 78.0 -  100.0 fL   MCH 28.2 26.0 - 34.0 pg   MCHC 32.0 30.0 - 36.0 g/dL   RDW 14.5 11.5 - 15.5 %   Platelets 250 150 - 400 K/uL  Glucose, capillary     Status: Abnormal   Collection Time: 06/03/14  6:46 AM  Result Value Ref Range   Glucose-Capillary 104 (H) 70 - 99 mg/dL     HEENT: normal Cardio: RRR and no murmur Resp: CTA B/L GI: BS positive, +Distention and no tenderness Extremity:  Edema pedal Skin:   Bruise multiple UE Neuro: Alert/Oriented, , Abnormal Motor 4/5 in the right deltoid, biceps, triceps, grip, hip flexor, knee extensor and ankle dorsiflexor, Aphasic and Apraxic Musc/Skel:  Other no pain with upper extremity or lower extremity active range of motion Gen. no acute distress Standing balance fair  Assessment/Plan: 1. Functional deficits secondary to left MCA distribution infarct with right hemiparesis  and aphasia as well as dysphagia  which require 3+ hours per day of interdisciplinary therapy in a comprehensive inpatient rehab setting. Physiatrist is providing close team supervision and 24 hour management of active medical problems listed below. Physiatrist and rehab team continue to assess barriers to discharge/monitor patient progress toward functional and medical goals. Team conference today please see physician documentation under team conference tab, met with team face-to-face to discuss problems,progress, and goals. Formulized individual treatment plan based on medical history, underlying problem and comorbidities.  Would like to restart plavix ASAP, will need clearance from uro.  Once restarted would like to monitor pt an additional 24-48hr for hematuria FIM: FIM - Bathing Bathing Steps Patient Completed: Chest, Right Arm, Left Arm, Abdomen, Front perineal area, Buttocks, Right upper leg, Left upper leg Bathing: 4: Min-Patient completes 8-9 [id number] parts or 75+ percent  FIM - Upper Body Dressing/Undressing Upper body dressing/undressing steps patient completed: Thread/unthread right sleeve of pullover shirt/dresss, Pull shirt over trunk, Thread/unthread left sleeve of pullover shirt/dress, Put head through opening of pull over shirt/dress Upper body dressing/undressing: 5: Supervision: Safety issues/verbal cues FIM - Lower Body Dressing/Undressing Lower body dressing/undressing steps patient completed: Fasten/unfasten pants, Pull underwear up/down, Thread/unthread left pants leg, Pull pants up/down Lower body dressing/undressing: 3: Mod-Patient completed 50-74% of tasks  FIM - Toileting Toileting steps completed by patient: Adjust clothing prior to toileting, Performs perineal hygiene, Adjust clothing after toileting Toileting Assistive Devices: Grab bar or rail for support Toileting: 5: Supervision: Safety issues/verbal cues  FIM - TRadio producer Devices: GProduct managerTransfers: 5-To toilet/BSC: Supervision (verbal cues/safety issues), 5-From toilet/BSC: Supervision (verbal cues/safety issues)  FIM - BControl and instrumentation engineerDevices: Arm rests Bed/Chair Transfer: 4: Chair or W/C > Bed: Min A (steadying Pt. > 75%), 4: Bed > Chair or W/C: Min A (steadying Pt. > 75%)  FIM - Locomotion: Wheelchair Locomotion: Wheelchair: 0: Activity did not occur FIM - Locomotion: Ambulation Locomotion: Ambulation Assistive Devices: Other (comment) (none) Ambulation/Gait Assistance: 4: Min guard Locomotion: Ambulation: 4: Travels 150 ft or more with minimal assistance (Pt.>75%)  Comprehension Comprehension Mode: Auditory Comprehension: 5-Understands basic 90% of the time/requires cueing < 10% of the time  Expression Expression Mode: Verbal Expression: 3-Expresses basic 50 - 74% of the time/requires cueing 25 - 50% of the time. Needs to repeat parts of sentences.  Social Interaction Social Interaction Mode: Asleep Social Interaction: 4-Interacts appropriately 75 - 89% of the time - Needs redirection for appropriate language or to initiate interaction.  Problem Solving Problem Solving Mode: Asleep Problem Solving: 3-Solves  basic 50 - 74% of the time/requires cueing 25 - 49% of the time  Memory Memory Mode: Asleep Memory: 3-Recognizes or recalls 50 - 74% of the time/requires cueing 25 - 49% of the time  Medical Problem List and Plan: 1. Functional deficits secondary to left MCA territory infarct with ICA occlusion status post mechanical thrombectomy with left ICA stenting, embolic secondary to large vessel atherosclerosis 2.  DVT Prophylaxis/Anticoagulation: Subcutaneous Lovenox. Monitor platelet counts and any signs of bleeding. Venous Doppler studies 05/25/2014 negative 3. Pain Management: Tylenol as needed 4. Dysphagia. Dysphagia 1 thin liquid diet. Follow-up speech therapy 5. Neuropsych: This patient is capable  of making decisions on his own behalf.  -will schedule trazodone for sleep 6. Skin/Wound Care: Routine skin checks 7. Fluids/Electrolytes/Nutrition: encourage po 8. Hyperlipidemia. Lipitor 9. Hypertension. Allowing for permissive hypertension. No present antihypertensive medication. Patient on Norvasc 5 mg daily, hydrochlorothiazide 25 mg daily prior to admission. Resumed Norvasc as Systolic BP creeping up 10. GERD. Protonix 11. Wheezing: does not appear uncomfortable, sats ok. Afebrile. No cough. Keep an eye on weights  -continue albuteraol neb to tid 12.  Urinary retention, with hematuria, clearing with CBI, CT abd pending, repeat CBC , last Hgb ok 13.  Pancreatic mass at tail, no emergent tx needed , GI consulted , await eval, will need OP f/u for this as well LOS (Days) [place] E 06/03/2014, 9:03 AM

## 2014-06-03 NOTE — Plan of Care (Signed)
Problem: RH BLADDER ELIMINATION Goal: RH STG MANAGE BLADDER WITH ASSISTANCE STG Manage Bladder With min Assistance  Outcome: Not Progressing Pt requiring I&O cath

## 2014-06-03 NOTE — Progress Notes (Signed)
Speech Language Pathology Weekly Progress and Session Note  Patient Details  Name: Henry [name] MRN: [id number] Date of Birth: Jun 17, 1934  Beginning of progress report period: May 27, 2014 End of progress report period: June 03, 2014  Today's Date: 06/03/2014 SLP Individual Time: 1300-1400 SLP Individual Time Calculation (min): 60 min  Short Term Goals: Week 1: SLP Short Term Goal 1 (Week 1): Pt will use multimodal communication to convey immediate needs/wants with max verbal and visual cues  SLP Short Term Goal 1 - Progress (Week 1): Met SLP Short Term Goal 2 (Week 1): Pt will follow 2 step commands during basic, familiar ADLs/home management tasks with max assist verbal, visual, and demonstration cues.   SLP Short Term Goal 2 - Progress (Week 1): Met SLP Short Term Goal 3 (Week 1): Pt will answer immediate, biographical, and/or environmental yes/no questions accurately with max assist verbal and visual cues.   SLP Short Term Goal 3 - Progress (Week 1): Met SLP Short Term Goal 4 (Week 1): Pt will sustain attention to a basic familiar home mangement task for 3-5 minute intervals with mod cues verbal, visual, and tactile cues for redirection.   SLP Short Term Goal 4 - Progress (Week 1): Met SLP Short Term Goal 5 (Week 1): Pt will consume dys 1 textures and thin liquids with min verbal and visual cues for use of rate and portion control as well as monitoring and correcting right sided buccal residue over 3 consecutive sessions to demonstrate readiness for diet progression.  SLP Short Term Goal 5 - Progress (Week 1): Met SLP Short Term Goal 6 (Week 1): Pt will return demonstration of 2 safety precautions during basic functional tasks with max assist verbal,visual, and tactile cues.   SLP Short Term Goal 6 - Progress (Week 1): Met    New Short Term Goals: Week 2: SLP Short Term Goal 1 (Week 2): Pt will use multimodal communication to convey immediate needs/wants with mod verbal and  visual cues  SLP Short Term Goal 2 (Week 2): Pt will follow 2 step commands during basic, familiar ADLs/home management tasks with mod assist verbal, visual, and demonstration cues.   SLP Short Term Goal 3 (Week 2): Pt will answer immediate, biographical, and/or environmental yes/no questions accurately with mod assist verbal and visual cues.   SLP Short Term Goal 4 (Week 2): Pt will sustain attention to a basic familiar home mangement task for 3-5 minute intervals with min cues verbal, visual, and tactile cues for redirection.   SLP Short Term Goal 5 (Week 2): Pt will consume dys 3 textures and thin liquids with min verbal and visual cues for use of rate and portion control as well as monitoring and correcting right sided buccal residue over 3 consecutive sessions to demonstrate readiness for diet progression.  SLP Short Term Goal 6 (Week 2): Pt will return demonstration of 2 safety precautions during basic functional tasks with mod assist verbal,visual, and tactile cues.    Weekly Progress Updates:  Pt made functional gains this reporting period and has met 6 out of 6 short term goals.  Pt requires max assist for multimodal communication to convey basic, immediate needs/wants and for yes/no response accuracy.  Pt has demonstrated improved spontaneity of verbal expression but continues to exhibit perseveration from previous utterances and semantic paraphasic errors.  Pt also requires mod-max assist for basic, familiar cognitive tasks due to impulsivity, decreased sustained attention, and poor awareness of deficits.  Pt is consuming dys 3 textures  and thin liquids with full supervision for use of swallowing precautions.  Pt also continues to present with a mild-moderate dysarthria characterized by imprecise articulation of consonants, rapid rate of speech, and decreased vocal intensity which impacts intelligibility in phrases.  Pt would continue to benefit from skilled ST follow up while inpatient in order  to maximize functional independence and reduce burden of care prior to discharge.  Continue to anticipate that pt will need 24/7 supervision at discharge, assistance for financial and medication management, and ST follow up to continue to address cognitive-linguistic and swallowing function.  Pt and family education is ongoing.     Intensity: Minumum of 1-2 x/day, 30 to 90 minutes Frequency: 3 to 5 out of 7 days Duration/Length of Stay: 10-12 days  Treatment/Interventions: Cognitive remediation/compensation;Cueing hierarchy;Dysphagia/aspiration precaution training;Functional tasks;Patient/family education;Oral motor exercises;Internal/external aids;Environmental controls   Daily Session  Skilled Therapeutic Interventions: Pt was seen for skilled ST targeting cognitive-linguistic goals and family education.  Upon arrival, pt's was seated upright in recliner, awake, lethargic, but pleasantly interactive.  [name], Henry [name], was present for the duration of today's therapy sessions; therefore, SLP initiated skilled education related to pt's currently prescribed diet and recommended swallowing precautions.  Handout was provided of dys 3 textures to facilitate carryover in the home.  SLP provided pt's wife with cuing techniques to maximize pt's independent use of rate and portion control.  SLP also provided skilled education related to communication partner strategies including portions of supported conversation techniques to maximize pt's independence for functional communication in the home environment.  Handout was provided.  Following education, SLP facilitated the session with a basic kitchen task targeting sustained attention, safety awareness, and functional word finding of basic objects.  Pt utilized a written list of food items to locate from the kitchen cabinets with mod-max assist verbal and visual cues.  Pt then named the abovementioned items with mod assist verbal and written cues for >75%  accuracy.   Pt benefited from mod-max assist multimodal cues for redirection in the presence of environmental distractions and disruptions.  During task, SLP modeled and explained cuing strategies for cognition and communication.  Goals updated on this date to reflect current progress and plan of care.         FIM:  Comprehension Comprehension Mode: Auditory Comprehension: 5-Understands basic 90% of the time/requires cueing < 10% of the time Expression Expression Mode: Verbal Expression: 3-Expresses basic 50 - 74% of the time/requires cueing 25 - 50% of the time. Needs to repeat parts of sentences. Social Interaction Social Interaction: 4-Interacts appropriately 75 - 89% of the time - Needs redirection for appropriate language or to initiate interaction. Problem Solving Problem Solving: 3-Solves basic 50 - 74% of the time/requires cueing 25 - 49% of the time Memory Memory: 3-Recognizes or recalls 50 - 74% of the time/requires cueing 25 - 49% of the time General    Pain Pain Assessment Pain Assessment: No/denies pain  Therapy/Group: Individual Therapy  Fatmata Legere, Selinda Orion 06/03/2014, 4:31 PM

## 2014-06-04 ENCOUNTER — Inpatient Hospital Stay (HOSPITAL_COMMUNITY): Payer: Medicare Other

## 2014-06-04 ENCOUNTER — Encounter (HOSPITAL_COMMUNITY): Payer: Medicare Other | Admitting: Speech Pathology

## 2014-06-04 ENCOUNTER — Inpatient Hospital Stay (HOSPITAL_COMMUNITY): Payer: Medicare Other | Admitting: Occupational Therapy

## 2014-06-04 ENCOUNTER — Encounter (HOSPITAL_COMMUNITY): Payer: Medicare Other | Admitting: Occupational Therapy

## 2014-06-04 DIAGNOSIS — D62 Acute posthemorrhagic anemia: Secondary | ICD-10-CM

## 2014-06-04 MED ORDER — CLOPIDOGREL BISULFATE 75 MG PO TABS
75.0000 mg | ORAL_TABLET | Freq: Every day | ORAL | Status: DC
  Administered 2014-06-04 – 2014-06-05 (×2): 75 mg via ORAL
  Filled 2014-06-04 (×4): qty 1

## 2014-06-04 NOTE — Progress Notes (Signed)
Social Work Patient ID: Henry [name], male   DOB: Nov 27, 1934, 80 y.o.   MRN: [id number]   CSW met with pt and his wife to update them on team conference discussion.  Pt is ready to go home, but wife is a little unsure how well pt is going to listen to her at home.  CSW encouraged her to go through family education with the therapists so she would feel more comfortable.  Wife was agreeable.  OT also recommended a shower seat with back and she would like to just order that while pt is still on CIR.  CSW did this through [facility].  CSW also provided encouragement to pt's wife.  With pt going home with foley catheter, home health for RN, PT, OT, and ST will provide pt and wife with support at home.  Wife felt good about this plan and requested [facility] and would like Amy Lynch for PT, if possible.  CSW remains available as needed.

## 2014-06-04 NOTE — Progress Notes (Signed)
Physical Therapy Discharge Summary  Patient Details  Name: Henry [name] MRN: [id number] Date of Birth: 09-24-1934  Today's Date: 06/04/2014 PT Individual Time: 1000-1100 PT Individual Time Calculation (min): 60 min    Patient has met 8 of 9 long term goals due to improved activity tolerance, improved balance, improved postural control, functional use of  right upper extremity and right lower extremity, improved attention and improved awareness. Henry [name] was not repeated at d/c due to unexpected d/c.  Patient to discharge at an ambulatory level Supervision.   Patient's care partner is independent to provide the necessary cognitive assistance at discharge. Pt has decreased functional awareness of R during ambulation in crowded environments.  Reasons goals not met: unexpected d/c; Berg not performed at d/c  Recommendation:  Patient will benefit from ongoing skilled PT services in outpatient setting to continue to advance safe functional mobility, address ongoing impairments in high level balance and gait, dual tasks,  and minimize fall risk.  Equipment: No equipment provided  Reasons for discharge: treatment goals met and discharge from hospital  Patient/family agrees with progress made and goals achieved: Yes  PT Discharge  Wife trained in fall recovery , safety considerations, and observed pt perform modified independent floor transfer.  She supervised gait in crowded environments and car transfer with supervision.  PT educated wife on pt's difficulty with dual tasks during ambulation, and risk of fall due to pt's reduced attention to R. Pt has hand out for [place] but wife not trained in these due to unexpected d/c.  Wife is very nervous about pt due to recent finding of pancreatic lesion; this PT does not know if pt is aware of this lesion. Precautions/Restrictions Precautions Precautions: Fall Precaution Comments:   Restrictions Weight Bearing Restrictions: No Vital  Signs Therapy Vitals Temp: 97.5 F (36.4 C) Temp Source: Oral Pulse Rate: 72 Resp: 18 BP: 131/68 mmHg Patient Position (if appropriate): Sitting Oxygen Therapy SpO2: 100 % O2 Device: Not Delivered Pain Pain Assessment Pain Assessment: No/denies pain Vision/Perception  Vision - Assessment Alignment/Gaze Preference: Gaze left  Cognition Overall Cognitive Status: Impaired/Different from baseline Arousal/Alertness: Awake/alert Orientation Level: Oriented X4 Attention: Selective Sustained Attention Impairment: Functional basic Awareness: Impaired Awareness Impairment: Emergent impairment Problem Solving: Impaired Problem Solving Impairment: Functional basic Safety/Judgment: Impaired Sensation Sensation Light Touch: Appears Intact Hot/Cold: Appears Intact Proprioception: Appears Intact Coordination Gross Motor Movements are Fluid and Coordinated: Yes Fine Motor Movements are Fluid and Coordinated: Yes Heel Shin Test: RLE with mildly reduced speed and accuracy Motor  Motor Motor - Skilled Clinical Observations: improved hemiplegia and postural alignment  Mobility Bed Mobility Bed Mobility:  (modified independent for all) Transfers Sit to Stand: 6: Modified independent (Device/Increase time) Stand to Sit: 6: Modified independent (Device/Increase time) Stand Pivot Transfers: 5: Supervision Stand Pivot Transfer Details: Verbal cues for precautions/safety Locomotion  Ambulation Ambulation: Yes Ambulation/Gait Assistance: 5: Supervision Ambulation Distance (Feet): 170 Feet Assistive device: None Ambulation/Gait Assistance Details: Verbal cues for precautions/safety Gait Gait: Yes Gait Pattern: Impaired Gait Pattern: Lateral trunk lean to left Gait velocity: impulsive at times, requiring cueing to slow down Stairs / Additional Locomotion Stairs: Yes Stairs Assistance: 5: Supervision Stair Management Technique: One rail Left Number of Stairs: 12 Height of Stairs:  7 Ramp: 5: Supervision Curb: 5: Supervision Wheelchair Mobility Wheelchair Mobility: No  Trunk/Postural Assessment  Cervical Assessment Cervical Assessment: Within Functional Limits Thoracic Assessment Thoracic Assessment: Within Functional Limits Lumbar Assessment Lumbar Assessment: Within Functional Limits Postural Control Postural Control: Within Functional Limits  Balance Standardized Balance Assessment Standardized Balance Assessment: Berg Balance Test Berg Balance Test Sit to Stand: Able to stand  independently using hands Standing Unsupported: Able to stand 2 minutes with supervision Sitting with Back Unsupported but Feet Supported on Floor or Stool: Able to sit safely and securely 2 minutes Stand to Sit: Controls descent by using hands Transfers: Able to transfer safely, definite need of hands Standing Unsupported with Eyes Closed: Able to stand 10 seconds with supervision Standing Ubsupported with Feet Together: Able to place feet together independently but unable to hold for 30 seconds From Standing, Reach Forward with Outstretched Arm: Can reach forward >12 cm safely (5") From Standing Position, Pick up Object from Floor: Able to pick up shoe, needs supervision From Standing Position, Turn to Look Behind Over each Shoulder: Looks behind from both sides and weight shifts well Turn 360 Degrees: Able to turn 360 degrees safely one side only in 4 seconds or less Standing Unsupported, Alternately Place Feet on Step/Stool: Able to complete 4 steps without aid or supervision Standing Unsupported, One Foot in Front: Able to take small step independently and hold 30 seconds Standing on One Leg: Able to lift leg independently and hold equal to or more than 3 seconds Total Score: 40 Dynamic Sitting Balance Dynamic Sitting - Level of Assistance: 6: Modified independent (Device/Increase time) Static Standing Balance Static Standing - Level of Assistance: 6: Modified independent  (Device/Increase time) Dynamic Standing Balance Dynamic Standing - Balance Support: During functional activity Dynamic Standing - Level of Assistance: 5: Stand by assistance   Berg performed 05/29/14  Extremity Assessment  RUE Assessment RUE Assessment: Within Functional Limits (decr attention to right side at time but [facility] ) LUE Assessment LUE Assessment: Within Functional Limits RLE Assessment RLE Assessment: Within Functional Limits LLE Assessment LLE Assessment: Within Functional Limits  See FIM for current functional status  Azhia Siefken 06/04/2014, 5:04 PM

## 2014-06-04 NOTE — Consult Note (Signed)
Unassigned Consult  Reason for Consult: Pancreatic tail mass Referring Physician: Rehab.  [name] HPI: This is a 80 year old male admitted for a left MCA stroke.  During the hospitalization he had an episode of hematuria and a CT scan was recommended.  The scan incidentally identified a 2.4 x 2.9 cm pancreatic tail mass.  There is no family history of pancreatic cancer.  Currently he feels well and his rehab is progressing.  In fact, he is to be discharged today. Currently he takes Plavix.  Past Medical History  Diagnosis Date  . Hypertension   . GERD (gastroesophageal reflux disease)   . Hypercholesteremia   . HOH (hard of hearing)     Past Surgical History  Procedure Laterality Date  . Shoulder surgery Right     torn cartiledge  . Esophagogastroduodenoscopy  10/28/2010    Procedure: ESOPHAGOGASTRODUODENOSCOPY (EGD);  Surgeon: [name], MD;  [facility];  Service: Endoscopy;  Laterality: N/A;  10:30  . Colonoscopy    . Colonoscopy  05/17/2011    Procedure: COLONOSCOPY;  Surgeon: [name], MD;  [facility];  Service: Endoscopy;  Laterality: N/A;  830  . Resection distal clavical Right 12/13/2012    Procedure: RESECTION DISTAL CLAVICAL;  Surgeon: Carole Civil, MD;  Location: [facility];  Service: Orthopedics;  Laterality: Right;  . Shoulder arthroscopy with bicepstenotomy Right 12/13/2012    Procedure: SHOULDER ARTHROSCOPY WITH BICEPS TENOTOMY, limited shoulder debridement;  Surgeon: Carole Civil, MD;  Location: [facility];  Service: Orthopedics;  Laterality: Right;  . Esophagogastroduodenoscopy N/A 04/24/2014    Procedure: ESOPHAGOGASTRODUODENOSCOPY (EGD);  Surgeon: [name], MD;  [facility];  Service: Endoscopy;  Laterality: N/A;  815  . Radiology with anesthesia N/A 05/23/2014    Procedure: RADIOLOGY WITH ANESTHESIA;  Surgeon: Medication Radiologist, MD;  Location: [place];  Service: Radiology;  Laterality: N/A;     Family History  Problem Relation Age of Onset  . Colon cancer Neg Hx     Social History:  reports that he quit smoking about 45 years ago. His smoking use included Cigarettes. He has a 17.5 pack-year smoking history. He does not have any smokeless tobacco history on file. He reports that he drinks about 1.8 oz of alcohol per week. He reports that he does not use illicit drugs.  Allergies: No Known Allergies  Medications:  Scheduled: . amLODipine  5 mg Oral Daily  . aspirin EC  81 mg Oral Daily  . atorvastatin  40 mg Oral QPM  . clopidogrel  75 mg Oral Daily  . feeding supplement (ENSURE ENLIVE)  237 mL Oral TID WC  . finasteride  5 mg Oral Daily  . senna-docusate  2 tablet Oral BID   Continuous: . sterile water for irrigation Stopped (06/01/14 1929)    No results found for this or any previous visit (from the past 24 hour(s)).   No results found.  ROS:  As stated above in the HPI otherwise negative.  Blood pressure 131/68, pulse 72, temperature 97.5 F (36.4 C), temperature source Oral, resp. rate 18, weight 114.27 kg (251 lb 14.7 oz), SpO2 100 %.    PE: Gen: NAD, Alert and Oriented HEENT:  [place]/AT, EOMI Neck: Supple, no LAD Lungs: CTA Bilaterally CV: RRR without M/G/R ABM: Soft, NTND, +BS Ext: No C/C/E  Assessment/Plan: 1) Pancreatic tail mass. 2) Left MCA stroke - improving. 3) On Plavix.   The lesion is concerning for  a malignancy.  I will arrange for an outpatient follow up.  He will require an EUS with FNA and he will need to be off of his Plavix.  Plan: 1) Follow up in the office in two weeks.  Ellory Khurana D 06/04/2014, 3:21 PM

## 2014-06-04 NOTE — Discharge Instructions (Signed)
Inpatient Rehab Discharge Instructions  Henry [name] Discharge date and time: No discharge date for patient encounter.   Activities/Precautions/ Functional Status: Activity: activity as tolerated Diet: soft Wound Care: none needed Functional status:  ___ No restrictions     ___ Walk up steps independently _x__ 24/7 supervision/assistance   ___ Walk up steps with assistance ___ Intermittent supervision/assistance  ___ Bathe/dress independently ___ Walk with walker     ___ Bathe/dress with assistance ___ Walk Independently    ___ Shower independently __x STROKE/TIA DISCHARGE INSTRUCTIONS SMOKING Cigarette smoking nearly doubles your risk of having a stroke & is the single most alterable risk factor  If you smoke or have smoked in the last 12 months, you are advised to quit smoking for your health.  Most of the excess cardiovascular risk related to smoking disappears within a year of stopping.  Ask you doctor about anti-smoking medications  [place] Quit Line: 1-800-QUIT NOW  Free Smoking Cessation Classes [phone]  CHOLESTEROL Know your levels; limit fat & cholesterol in your diet  Lipid Panel     Component Value Date/Time   CHOL 110 05/24/2014 0354   TRIG 79 05/24/2014 0354   HDL 35* 05/24/2014 0354   CHOLHDL 3.1 05/24/2014 0354   VLDL 16 05/24/2014 0354   LDLCALC 59 05/24/2014 0354      Many patients benefit from treatment even if their cholesterol is at goal.  Goal: Total Cholesterol (CHOL) less than 160  Goal:  Triglycerides (TRIG) less than 150  Goal:  HDL greater than 40  Goal:  LDL (LDLCALC) less than 100   BLOOD PRESSURE American Stroke Association blood pressure target is less that 120/80 mm/Hg  Your discharge blood pressure is:  BP: 109/62 mmHg  Monitor your blood pressure  Limit your salt and alcohol intake  Many individuals will require more than one medication for high blood pressure  DIABETES (A1c is a blood sugar average for last 3 months) Goal  HGBA1c is under 7% (HBGA1c is blood sugar average for last 3 months)  Diabetes: No known diagnosis of diabetes    Lab Results  Component Value Date   HGBA1C 6.2* 05/24/2014     Your HGBA1c can be lowered with medications, healthy diet, and exercise.  Check your blood sugar as directed by your physician  Call your physician if you experience unexplained or low blood sugars.  PHYSICAL ACTIVITY/REHABILITATION Goal is 30 minutes at least 4 days per week  Activity: Increase activity slowly, Therapies: Physical Therapy: Home Health Return to work:   Activity decreases your risk of heart attack and stroke and makes your heart stronger.  It helps control your weight and blood pressure; helps you relax and can improve your mood.  Participate in a regular exercise program.  Talk with your doctor about the best form of exercise for you (dancing, walking, swimming, cycling).  DIET/WEIGHT Goal is to maintain a healthy weight  Your discharge diet is: DIET DYS 3 Room service appropriate?: Yes; Fluid consistency:: Thin  liquids Your height is:    Your current weight is: Weight: 114.21 kg (251 lb 12.6 oz) Your Body Mass Index (BMI) is:     Following the type of diet specifically designed for you will help prevent another stroke.  Your goal weight range is:    Your goal Body Mass Index (BMI) is 19-24.  Healthy food habits can help reduce 3 risk factors for stroke:  High cholesterol, hypertension, and excess weight.  RESOURCES Stroke/Support Group:  Call  [place] PROVIDED/REVIEWED AND GIVEN TO PATIENT Stroke warning signs and symptoms How to activate emergency medical system (call 911). Medications prescribed at discharge. Need for follow-up after discharge. Personal risk factors for stroke. Pneumonia vaccine given:  Flu vaccine given:  My questions have been answered, the writing is legible, and I understand these instructions.  I will adhere to these goals &  educational materials that have been provided to me after my discharge from the hospital.    _ Walk with assistance    ___ Shower with assistance ___ No alcohol     ___ Return to work/school ________  COMMUNITY REFERRALS UPON DISCHARGE:   Home Health:   PT     OT     ST     RN  Agency:  [facility] Phone:  [phone] Medical Equipment/Items Ordered:  Tub seat with back  Agency/Supplier:  [facility]       Phone:  [phone]  GENERAL COMMUNITY RESOURCES FOR PATIENT/FAMILY: Support Groups:  [facility] Stroke Support Group                              Meets the 3rd Sunday of every month at 3 PM, except for June, July, August                              In the dayroom on [facility]  Special Instructions: Flomax 0.4 mg daily begin 3 days prior to follow-up appointment with Dr. Jeffie Pollock of urology services  Cipro 250 mg twice a day. (3 days prior to follow-up with Dr. Jeffie Pollock of urology services   My questions have been answered and I understand these instructions. I will adhere to these goals and the provided educational materials after my discharge from the hospital.  Patient/Caregiver Signature _______________________________ Date __________  Clinician Signature _______________________________________ Date __________  Please bring this form and your medication list with you to all your follow-up doctor's appointments.

## 2014-06-04 NOTE — Progress Notes (Signed)
Social Work Patient ID: Henry [name], male   DOB: Apr 28, 1934, 80 y.o.   MRN: [id number]   Lynnda Child, LCSW Social Worker Signed  Patient Care Conference 06/04/2014 10:54 PM    Expand All Collapse All   Inpatient RehabilitationTeam Conference and Plan of Care Update Date: 06/03/2014   Time: 10:35 AM     Patient Name: Henry [name]       Medical Record Number: [id number]  Date of Birth: 1934-10-30 Sex: Male         Room/Bed: 4W20C/4W20C-01 Payor Info: Payor: MEDICARE / Plan: MEDICARE PART A AND B / Product Type: *No Product type* /    Admitting Diagnosis: L CVA   Admit Date/Time:  05/26/2014  7:50 PM Admission Comments: No comment available   Primary Diagnosis:  <principal problem not specified> Principal Problem: <principal problem not specified>    Patient Active Problem List     Diagnosis  Date Noted   .  Aphasia S/P CVA  05/28/2014   .  Dysphagia, pharyngoesophageal phase  05/28/2014   .  Left middle cerebral artery stroke  05/26/2014   .  Coarctation of aorta, recurrent, post-intervention     .  Expressive aphasia     .  Respiratory failure     .  Acute right-sided weakness     .  Stroke  05/23/2014   .  S/P shoulder surgery  12/24/2012   .  S/P arthroscopy of shoulder  12/16/2012   .  Shoulder arthritis  12/13/2012   .  Biceps tendon rupture, proximal  12/13/2012   .  Partial tear of right subscapularis tendon  12/13/2012   .  Hypertension  05/10/2011   .  Hyperlipidemia  05/10/2011   .  Barrett esophagus  05/10/2011   .  Esophagitis  05/10/2011   .  FOOT SPRAIN, RIGHT  07/18/2007     Expected Discharge Date: Expected Discharge Date: 06/05/14  Team Members Present: Physician leading conference: Dr. Alysia Penna Social Worker Present: Alfonse Alpers, LCSW Nurse Present: Elliot Cousin, RN PT Present: Georjean Mode, PT OT Present: Simonne Come, Maryella Shivers, OT SLP Present: Windell Moulding, SLP PPS Coordinator present : Daiva Nakayama, RN, CRRN     Current Status/Progress  Goal  Weekly Team Focus   Medical     Hematuria, severe, retention, pancreativc mass  voiding without hematuria, no cath   cont rehab, Urology consult   Bowel/Bladder     Foley inplace. remove 4/20 0500. red clots/urine present. urology involved Incont of bowel at times  manage b/b min assist  assess bladder with PVRs when foley removed    Swallow/Nutrition/ Hydration     Dys 3, thin liquids   supervision for least restrictive diet    trials of advanced consistencies, diet toleration    ADL's     supervision for UB selfcare and grooming, mod to max assist for LB selfcare secondary to pain when attempting to bring his LEs up or when bending down to reach them.  Impulsive at times  overall supervision level  selfcare re-training, transfer training, balance re-training, cognitive re-training, pt/family education.   Mobility     S/min guard for all mobility, stairs, and gait >500' without device.   Suprevision transfers, gait, and stairs   Dynamic balance, safety training, fall risk reduction, DC planning   Communication     mod assist for functional communication, improved automaticity of spontaneous verbal output, able to follow 2-step commands   min assist for  multimodal communication   multimodal communication, accuracy of yes/no responses    Safety/Cognition/ Behavioral Observations    impulsivity, decreased selective attention, decreased problem solving for basic tasks due to receptive language and motor planning deficits   min assist for basic   selective attention in mildly distracting environments, intellectual awareness of deficits, safety awareness, problem solving for basic self care tasks    Pain     no complaints of pain         Skin     scatterred bruising noted; skin tear to right hand with foam inpalce  remain free skinbreakdown min assist   assess skin q shift    Rehab Goals Patient on target to meet rehab goals: Yes Rehab Goals Revised: none *See  Care Plan and progress notes for long and short-term goals.    Barriers to Discharge:  see above     Possible Resolutions to Barriers:   see above     Discharge Planning/Teaching Needs:   Pt plans to return to his home with his wife to provide 24/7 supervision.  Wife to be present for family education on 06-04-14.    Team Discussion:    Pt with hematuria, with foley catheter now.  May need to stay on rehab a little longer until cleared by urology.  Pt is not back on his Plavix yet.  Pt needs close supervision due to needing some cues for sequencing with dressing.  Had a loss of balance to the right with a quick turn.  Has some visual deficits and processing and spatial difficulties.  Pt is working with PT on higher level things and he has trouble with dual tasks due to cognitive deficits.  ST is working on motor planning deficits.  He is mod-max assist with communication.  He is on D3 diet and is impulsive.   Revisions to Treatment Plan:    none    Continued Need for Acute Rehabilitation Level of Care: The patient requires daily medical management by a physician with specialized training in physical medicine and rehabilitation for the following conditions: Daily direction of a multidisciplinary physical rehabilitation program to ensure safe treatment while eliciting the highest outcome that is of practical value to the patient.: Yes Daily medical management of patient stability for increased activity during participation in an intensive rehabilitation regime.: Yes Daily analysis of laboratory values and/or radiology reports with any subsequent need for medication adjustment of medical intervention for : Neurological problems  Arianny Pun, Silvestre Mesi 06/04/2014, 10:54 PM

## 2014-06-04 NOTE — Progress Notes (Signed)
Occupational Therapy Discharge Summary  Patient Details  Name: Henry [name] MRN: [id number] Date of Birth: Aug 31, 1934  Today's Date: 06/04/2014 OT Individual Time: 0730-0800 OT Individual Time Calculation (min): 30 min  1:1 Participated in self feeding, functional social communication and grooming in standing at the sink. Pt required min questioning cuing for attention to right side in all three tasks. Pt able to demonstrate functional ambulation with distant supervision with extra time to attend to right side.   2nd session 11:00-12:00 1:1 focus on family education with pt's wife. Focused education on functional ambulation with attention to right attention to environment, how to cue for ideational apraxia during grooming tasks, tub shower transfer, DME recommendation of shower chair for washing LEs, community reintegration, fatigue after a stroke, therapeutic activities pt can take part in, basic care for cathter (threading in clothing, emptying etc.) GRAD DAY pt did participate in bathing and dressing in the ADL apartment with wife present with min questioning cuing for task organization/ sequencing.    Patient has met 11 of 11 long term goals due to improved activity tolerance, improved balance, postural control, ability to compensate for deficits, functional use of  RIGHT upper and RIGHT lower extremity, improved attention, improved awareness, improved coordination and improved communication.  Patient to discharge at overall Supervision level at an ambulatory level without a device.  Pt overall minA cognitively for basic and familiar tasks   Patient's care partner is independent to provide the necessary physical and cognitive assistance at discharge.    Reasons goals not met: n/a  Recommendation:  Patient will benefit from ongoing skilled OT services in home health setting to continue to advance functional skills in the area of BADL and iADL.  Equipment: shower chair  Reasons for  discharge: treatment goals met and discharge from hospital  Patient/family agrees with progress made and goals achieved: Yes  OT Discharge Precautions/Restrictions  Precautions Precautions: Fall Precaution Comments:   Restrictions Weight Bearing Restrictions: No Pain  no c/o pain ADL  see FIM Vision/Perception  Vision- History Baseline Vision/History: Wears glasses Wears Glasses: At all times Patient Visual Report: No change from baseline Vision- Assessment Vision Assessment?: Yes Alignment/Gaze Preference: Gaze left  Cognition Overall Cognitive Status: Impaired/Different from baseline Arousal/Alertness: Awake/alert Orientation Level: Oriented X4 Attention: Selective Sustained Attention Impairment: Functional basic Awareness: Impaired Awareness Impairment: Emergent impairment Problem Solving: Impaired Problem Solving Impairment: Functional basic Safety/Judgment: Impaired Sensation Sensation Light Touch: Appears Intact Hot/Cold: Appears Intact Proprioception: Appears Intact Coordination Gross Motor Movements are Fluid and Coordinated: Yes Fine Motor Movements are Fluid and Coordinated: Yes Motor  Motor Motor - Skilled Clinical Observations: improved hemiplegia and postural alignment Mobility  Transfers Transfers: Sit to Stand;Stand to Sit Sit to Stand: 6: Modified independent (Device/Increase time) Stand to Sit: 6: Modified independent (Device/Increase time)  Trunk/Postural Assessment  Cervical Assessment Cervical Assessment: Within Functional Limits Thoracic Assessment Thoracic Assessment: Within Functional Limits Lumbar Assessment Lumbar Assessment: Within Functional Limits Postural Control Postural Control: Within Functional Limits  Balance Dynamic Sitting Balance Dynamic Sitting - Level of Assistance: 6: Modified independent (Device/Increase time) Static Standing Balance Static Standing - Level of Assistance: 6: Modified independent (Device/Increase  time) Dynamic Standing Balance Dynamic Standing - Balance Support: During functional activity Dynamic Standing - Level of Assistance: 5: Stand by assistance Extremity/Trunk Assessment RUE Assessment RUE Assessment: Within Functional Limits (decr attention to right side at time but [facility] ) LUE Assessment LUE Assessment: Within Functional Limits  See FIM for current functional status  [name] [facility]  06/04/2014, 2:51 PM

## 2014-06-04 NOTE — Progress Notes (Signed)
Social Work Discharge Note  The overall goal for the admission was met for:   Discharge location: Yes - home  Length of Stay: Yes - 10 days  Discharge activity level: Yes - 24/7 supervision  Home/community participation: Yes  Services provided included: MD, RD, PT, OT, SLP, RN, Pharmacy and SW  Financial Services: Medicare and Private Insurance: [employer]  Follow-up services arranged: Home Health: PT/OT/ST/RN, DME: tub seat with back and Patient/Family request agency HH: [facility], DME: [facility]  Comments (or additional information):  Pt will go to his home with his wife and dtr providing 24/7 supervision.  Family Education has occurred and although wife is nervous, she feels we have prepared her to provide the proper care to pt.  Support through [facility] agency has been arranged and f/u appointments made.  Patient/Family verbalized understanding of follow-up arrangements: Yes  Individual responsible for coordination of the follow-up plan: pt's wife  Confirmed correct DME delivered: Henry [name] 06/04/2014    [name], Henry Mesi

## 2014-06-04 NOTE — Discharge Summary (Signed)
Discharge summary job # [id number]

## 2014-06-04 NOTE — Patient Care Conference (Signed)
Inpatient RehabilitationTeam Conference and Plan of Care Update Date: 06/03/2014   Time: 10:35 AM    Patient Name: Henry [name]      Medical Record Number: [id number]  Date of Birth: 1934/08/11 Sex: Male         Room/Bed: 4W20C/4W20C-01 Payor Info: Payor: MEDICARE / Plan: MEDICARE PART A AND B / Product Type: *No Product type* /    Admitting Diagnosis: L CVA  Admit Date/Time:  05/26/2014  7:50 PM Admission Comments: No comment available   Primary Diagnosis:  <principal problem not specified> Principal Problem: <principal problem not specified>  Patient Active Problem List   Diagnosis Date Noted  . Aphasia S/P CVA 05/28/2014  . Dysphagia, pharyngoesophageal phase 05/28/2014  . Left middle cerebral artery stroke 05/26/2014  . Coarctation of aorta, recurrent, post-intervention   . Expressive aphasia   . Respiratory failure   . Acute right-sided weakness   . Stroke 05/23/2014  . S/P shoulder surgery 12/24/2012  . S/P arthroscopy of shoulder 12/16/2012  . Shoulder arthritis 12/13/2012  . Biceps tendon rupture, proximal 12/13/2012  . Partial tear of right subscapularis tendon 12/13/2012  . Hypertension 05/10/2011  . Hyperlipidemia 05/10/2011  . Barrett esophagus 05/10/2011  . Esophagitis 05/10/2011  . FOOT SPRAIN, RIGHT 07/18/2007    Expected Discharge Date: Expected Discharge Date: 06/05/14  Team Members Present: Physician leading conference: Dr. Alysia Penna Social Worker Present: Alfonse Alpers, LCSW Nurse Present: Elliot Cousin, RN PT Present: Georjean Mode, PT OT Present: Simonne Come, Maryella Shivers, OT SLP Present: Windell Moulding, SLP PPS Coordinator present : Daiva Nakayama, RN, CRRN     Current Status/Progress Goal Weekly Team Focus  Medical   Hematuria, severe, retention, pancreativc mass  voiding without hematuria, no cath  cont rehab, Urology consult   Bowel/Bladder   Foley inplace. remove 4/20 0500. red clots/urine present. urology involved Incont of  bowel at times  manage b/b min assist  assess bladder with PVRs when foley removed   Swallow/Nutrition/ Hydration   Dys 3, thin liquids   supervision for least restrictive diet   trials of advanced consistencies, diet toleration   ADL's   supervision for UB selfcare and grooming, mod to max assist for LB selfcare secondary to pain when attempting to bring his LEs up or when bending down to reach them.  Impulsive at times  overall supervision level  selfcare re-training, transfer training, balance re-training, cognitive re-training, pt/family education.   Mobility   S/min guard for all mobility, stairs, and gait >500' without device.   Suprevision transfers, gait, and stairs  Dynamic balance, safety training, fall risk reduction, DC planning   Communication   mod assist for functional communication, improved automaticity of spontaneous verbal output, able to follow 2-step commands   min assist for multimodal communication   multimodal communication, accuracy of yes/no responses   Safety/Cognition/ Behavioral Observations  impulsivity, decreased selective attention, decreased problem solving for basic tasks due to receptive language and motor planning deficits  min assist for basic   selective attention in mildly distracting environments, intellectual awareness of deficits, safety awareness, problem solving for basic self care tasks    Pain   no complaints of pain         Skin   scatterred bruising noted; skin tear to right hand with foam inpalce  remain free skinbreakdown min assist  assess skin q shift    Rehab Goals Patient on target to meet rehab goals: Yes Rehab Goals Revised: none *See Care Plan and progress  notes for long and short-term goals.  Barriers to Discharge: see above    Possible Resolutions to Barriers:  see above    Discharge Planning/Teaching Needs:  Pt plans to return to his home with his wife to provide 24/7 supervision.  Wife to be present for family education  on 06-04-14.   Team Discussion:  Pt with hematuria, with foley catheter now.  May need to stay on rehab a little longer until cleared by urology.  Pt is not back on his Plavix yet.  Pt needs close supervision due to needing some cues for sequencing with dressing.  Had a loss of balance to the right with a quick turn.  Has some visual deficits and processing and spatial difficulties.  Pt is working with PT on higher level things and he has trouble with dual tasks due to cognitive deficits.  ST is working on motor planning deficits.  He is mod-max assist with communication.  He is on D3 diet and is impulsive.  Revisions to Treatment Plan:  none   Continued Need for Acute Rehabilitation Level of Care: The patient requires daily medical management by a physician with specialized training in physical medicine and rehabilitation for the following conditions: Daily direction of a multidisciplinary physical rehabilitation program to ensure safe treatment while eliciting the highest outcome that is of practical value to the patient.: Yes Daily medical management of patient stability for increased activity during participation in an intensive rehabilitation regime.: Yes Daily analysis of laboratory values and/or radiology reports with any subsequent need for medication adjustment of medical intervention for : Neurological problems  TRUE Garciamartinez, Silvestre Mesi 06/04/2014, 10:54 PM

## 2014-06-04 NOTE — Progress Notes (Signed)
Subjective/Complaints: FOley replaced, urine is clear.  Appreciate uro note   ROS - cannot obtain due to Aphasia Objective: Vital Signs: Blood pressure 122/59, pulse 63, temperature 98.2 F (36.8 C), temperature source Oral, resp. rate 18, weight 114.27 kg (251 lb 14.7 oz), SpO2 95 %. No results found. Results for orders placed or performed during the hospital encounter of 05/26/14 (from the past 72 hour(s))  CBC     Status: Abnormal   Collection Time: 06/01/14  8:19 AM  Result Value Ref Range   WBC 11.6 (H) 4.0 - 10.5 K/uL   RBC 4.19 (L) 4.22 - 5.81 MIL/uL   Hemoglobin 11.7 (L) 13.0 - 17.0 g/dL   HCT 36.1 (L) 39.0 - 52.0 %   MCV 86.2 78.0 - 100.0 fL   MCH 27.9 26.0 - 34.0 pg   MCHC 32.4 30.0 - 36.0 g/dL   RDW 14.1 11.5 - 15.5 %   Platelets 276 150 - 400 K/uL  Creatinine, serum     Status: Abnormal   Collection Time: 06/02/14  6:05 AM  Result Value Ref Range   Creatinine, Ser 1.14 0.50 - 1.35 mg/dL   GFR calc non Af Amer 60 (L) >90 mL/min   GFR calc Af Amer 70 (L) >90 mL/min    Comment: (NOTE) The eGFR has been calculated using the CKD EPI equation. This calculation has not been validated in all clinical situations. eGFR's persistently <90 mL/min signify possible Chronic Kidney Disease.   CBC     Status: Abnormal   Collection Time: 06/02/14  9:51 AM  Result Value Ref Range   WBC 10.4 4.0 - 10.5 K/uL   RBC 3.48 (L) 4.22 - 5.81 MIL/uL   Hemoglobin 9.8 (L) 13.0 - 17.0 g/dL   HCT 30.6 (L) 39.0 - 52.0 %   MCV 87.9 78.0 - 100.0 fL   MCH 28.2 26.0 - 34.0 pg   MCHC 32.0 30.0 - 36.0 g/dL   RDW 14.5 11.5 - 15.5 %   Platelets 250 150 - 400 K/uL  Glucose, capillary     Status: Abnormal   Collection Time: 06/03/14  6:46 AM  Result Value Ref Range   Glucose-Capillary 104 (H) 70 - 99 mg/dL     HEENT: normal Cardio: RRR and no murmur Resp: CTA B/L GI: BS positive, +Distention and no tenderness Extremity:  Edema pedal Skin:   Bruise multiple UE Neuro: Alert/Oriented, ,  Abnormal Motor 4/5 in the right deltoid, biceps, triceps, grip, hip flexor, knee extensor and ankle dorsiflexor, Aphasic and Apraxic Musc/Skel:  Other no pain with upper extremity or lower extremity active range of motion Gen. no acute distress Standing balance fair  Assessment/Plan: 1. Functional deficits secondary to left MCA distribution infarct with right hemiparesis and aphasia as well as dysphagia  which require 3+ hours per day of interdisciplinary therapy in a comprehensive inpatient rehab setting. Physiatrist is providing close team supervision and 24 hour management of active medical problems listed below. Physiatrist and rehab team continue to assess barriers to discharge/monitor patient progress toward functional and medical goals. WIll restart plavix, monitor for hematuria FIM: FIM - Bathing Bathing Steps Patient Completed: Chest, Right Arm, Left Arm, Abdomen, Front perineal area, Buttocks, Right upper leg, Left upper leg Bathing: 4: Min-Patient completes 8-9 21f10 parts or 75+ percent  FIM - Upper Body Dressing/Undressing Upper body dressing/undressing steps patient completed: Thread/unthread right sleeve of pullover shirt/dresss, Pull shirt over trunk, Thread/unthread left sleeve of pullover shirt/dress, Put head through opening of pull  over shirt/dress Upper body dressing/undressing: 5: Supervision: Safety issues/verbal cues FIM - Lower Body Dressing/Undressing Lower body dressing/undressing steps patient completed: Fasten/unfasten pants, Pull underwear up/down, Thread/unthread left pants leg, Pull pants up/down, Thread/unthread right pants leg, Thread/unthread left underwear leg, Thread/unthread right underwear leg, Don/Doff right shoe, Don/Doff left sock, Fasten/unfasten left shoe, Don/Doff left shoe, Don/Doff right sock, Fasten/unfasten right shoe Lower body dressing/undressing: 5: Supervision: Safety issues/verbal cues  FIM - Toileting Toileting steps completed by patient:  Adjust clothing prior to toileting, Performs perineal hygiene, Adjust clothing after toileting Toileting Assistive Devices: Grab bar or rail for support Toileting: 5: Supervision: Safety issues/verbal cues  FIM - Radio producer Devices: Product manager Transfers: 5-To toilet/BSC: Supervision (verbal cues/safety issues), 5-From toilet/BSC: Supervision (verbal cues/safety issues)  FIM - Control and instrumentation engineer Devices: Arm rests Bed/Chair Transfer: 4: Chair or W/C > Bed: Min A (steadying Pt. > 75%), 4: Bed > Chair or W/C: Min A (steadying Pt. > 75%)  FIM - Locomotion: Wheelchair Locomotion: Wheelchair: 0: Activity did not occur FIM - Locomotion: Ambulation Locomotion: Ambulation Assistive Devices: Other (comment) (none) Ambulation/Gait Assistance: 4: Min guard Locomotion: Ambulation: 4: Travels 150 ft or more with minimal assistance (Pt.>75%)  Comprehension Comprehension Mode: Auditory Comprehension: 5-Understands basic 90% of the time/requires cueing < 10% of the time  Expression Expression Mode: Verbal Expression: 3-Expresses basic 50 - 74% of the time/requires cueing 25 - 50% of the time. Needs to repeat parts of sentences.  Social Interaction Social Interaction Mode: Asleep Social Interaction: 4-Interacts appropriately 75 - 89% of the time - Needs redirection for appropriate language or to initiate interaction.  Problem Solving Problem Solving Mode: Asleep Problem Solving: 3-Solves basic 50 - 74% of the time/requires cueing 25 - 49% of the time  Memory Memory Mode: Asleep Memory: 3-Recognizes or recalls 50 - 74% of the time/requires cueing 25 - 49% of the time  Medical Problem List and Plan: 1. Functional deficits secondary to left MCA territory infarct with ICA occlusion status post mechanical thrombectomy with left ICA stenting, embolic secondary to large vessel atherosclerosis 2.  DVT Prophylaxis/Anticoagulation:  Subcutaneous Lovenox. Monitor platelet counts and any signs of bleeding. Venous Doppler studies 05/25/2014 negative 3. Pain Management: Tylenol as needed 4. Dysphagia. Dysphagia 1 thin liquid diet. Follow-up speech therapy 5. Neuropsych: This patient is capable of making decisions on his own behalf.  -will schedule trazodone for sleep 6. Skin/Wound Care: Routine skin checks 7. Fluids/Electrolytes/Nutrition: encourage po 8. Hyperlipidemia. Lipitor 9. Hypertension. Allowing for permissive hypertension. No present antihypertensive medication. Patient on Norvasc 5 mg daily, hydrochlorothiazide 25 mg daily prior to admission. Resumed Norvasc as Systolic BP creeping up 10. GERD. Protonix 11. Wheezing: does not appear uncomfortable, sats ok. Afebrile. No cough. Keep an eye on weights  -continue albuteraol neb to tid 12.  Urinary retention, with hematuria, clearing with CBI, CT abd pending, repeat CBC , last Hgb ok 13.  Pancreatic mass at tail, no emergent tx needed , GI consulted , await eval, will need OP f/u for this as well 14.  ABLA will recheck CBC in am, no need for transfusion  LOS (Days) 9 A FACE TO FACE EVALUATION WAS PERFORMED  KIRSTEINS,ANDREW E 06/04/2014, 8:15 AM

## 2014-06-04 NOTE — Progress Notes (Addendum)
Speech Language Pathology Discharge Summary  Patient Details  Name: Henry [name] MRN: [id number] Date of Birth: 02/28/1934  Today's Date: 06/04/2014 SLP Individual Time: 1330-1430 SLP Individual Time Calculation (min): 60 min   Skilled Therapeutic Interventions:  Pt was seen for skilled ST targeting completion of family education.  Upon arrival, pt was seated upright in recliner, awake, alert, and agreeable to participate in ST.  SLP demonstrated skilled cuing techniques to maximize independence for functional communication during both structured and unstructured tasks.  [name] returned demonstration of phonemic cuing strategies to facilitate word finding of basic, functional objects with supervision.  SLP also reviewed and reinforced use of supported conversation techniques during conversations with both skilled and unskilled communication partners.  Pt education is complete at this time.  All pt and pt's family's questions were answered to their satisfaction at this time.       Patient has met 10 of 10 long term goals.  Patient to discharge at overall Min;Supervision level.  Reasons goals not met: n/a   Clinical Impression/Discharge Summary:  Pt made functional gains while inpatient and is discharging having met 10 out of 10 long term goals.   Pt currently can convey basic, immediate needs/wants with min-mod assist verbal cues.  Pt follows 1-2 step commands with min assist multimodal cues and can answer basic biographical and/or environmental yes/no questions with min assist. Pt also currently requires min assist for basic, familiar cognitive tasks due to impulsivity and decreased sustained attention which impacts higher level processes. Pt has been upgraded to dys 3 textures with thin liquids while inpatient and is consuming said diet with supervision for use of rate and portion control.  Pt is discharging home with 24/7 supervision from family.  Pt and family education is complete at this  time.  SLP recommends home health follow up ST to continue to address cognitive-linguistic and swallowing function.    Care Partner:  Caregiver Able to Provide Assistance: Yes  Type of Caregiver Assistance: Physical;Cognitive  Recommendation:  Home Health SLP;Outpatient SLP;24 hour supervision/assistance  Rationale for SLP Follow Up: Maximize functional communication;Maximize swallowing safety;Maximize cognitive function and independence;Reduce caregiver burden   Equipment: none recommended by SLP    Reasons for discharge: Discharged from hospital   Patient/Family Agrees with Progress Made and Goals Achieved: Yes   See FIM for current functional status  ,  L 06/04/2014, 8:55 PM    

## 2014-06-05 MED ORDER — OXYCODONE HCL 10 MG PO TABS: 10.0000 mg | ORAL_TABLET | Freq: Four times a day (QID) | ORAL | Status: DC | PRN

## 2014-06-05 MED ORDER — ASPIRIN 81 MG PO TBEC: 81.0000 mg | DELAYED_RELEASE_TABLET | Freq: Every day | ORAL | Status: DC

## 2014-06-05 MED ORDER — FINASTERIDE 5 MG PO TABS: 5.0000 mg | ORAL_TABLET | Freq: Every day | ORAL | Status: DC

## 2014-06-05 MED ORDER — ALPRAZOLAM 0.5 MG PO TABS: 0.5000 mg | ORAL_TABLET | Freq: Three times a day (TID) | ORAL | Status: DC | PRN

## 2014-06-05 MED ORDER — AMLODIPINE BESYLATE 5 MG PO TABS: 5.0000 mg | ORAL_TABLET | Freq: Every day | ORAL | Status: DC

## 2014-06-05 MED ORDER — CLOPIDOGREL BISULFATE 75 MG PO TABS: 75.0000 mg | ORAL_TABLET | Freq: Every day | ORAL | Status: DC

## 2014-06-05 MED ORDER — ATORVASTATIN CALCIUM 40 MG PO TABS: 40.0000 mg | ORAL_TABLET | Freq: Every evening | ORAL | Status: AC

## 2014-06-05 NOTE — Progress Notes (Signed)
Educated wife on foley care and how to empty foley bag. Wife demonstrated proper technique with no further questions at this time.

## 2014-06-05 NOTE — Discharge Summary (Signed)
NAMEDHANVIN, SZETO NO.:  [id number]  MEDICAL RECORD NO.:  [id number]  LOCATION:  4W20C                        FACILITY:  [facility]  PHYSICIAN:  Charlett Blake, M.D.DATE OF BIRTH:  [birth year]  DATE OF ADMISSION:  05/26/2014 DATE OF DISCHARGE:  06/05/2014                              DISCHARGE SUMMARY   DISCHARGE DIAGNOSES: 1. Functional deficits secondary to left middle cerebral artery     territory infarct with internal carotid artery occlusion status     post mechanical thrombectomy with internal carotid artery stenting. 2. Subcutaneous Lovenox for deep venous thrombosis prophylaxis,     discontinued. 3. Dysphagia. 4. Hypertension. 5. Benign prostatic hypertrophy with hematuria. 6. Incidental finding of pancreatic mass at tail. 7. Gastroesophageal reflux disease. 8. Acute blood loss anemia.  HISTORY OF PRESENT ILLNESS:  This is a 80 year old right-handed male, history of hypertension, who was independent prior to admission living with his wife.  He presented on May 23, 2014, to [facility], with syncopal episodes, findings of aphasia as well as right-sided weakness.  The patient did not receive tPA.  Initial cranial CT scan showed no acute abnormalities.  CT angio of the head showed nonvisualization of the left trocar artery occluded proximally within the neck.  He was transferred to [facility] for ongoing care. CT cerebral perfusion scan showed areas of ischemic core infarct within the left MCA territory.  Echocardiogram with ejection fraction of 54% grade 1 diastolic dysfunction.  Venous Dopplers showed no signs of DVT. Carotid arteriogram completed by revascularization of acutely occluded left ICA proximal with stenting assisted angioplasty by Interventional Radiology.  The patient initially maintained on ventilator for a short time.  Followup MRI showed multiple areas of acute ischemic left middle cerebral artery territory  infarct.  Maintained on aspirin and Plavix for CVA prophylaxis.  Subcutaneous Lovenox for DVT prophylaxis.  Continue on dysphagia 1 thin liquid diet.  Physical and occupational therapy ongoing.  The patient was admitted for comprehensive rehab program.  PAST MEDICAL HISTORY:  See discharge diagnoses.  SOCIAL HISTORY:  Lives with his wife, independent prior to admission.  FUNCTIONAL STATUS:  Upon admission to rehab services was +2 physical assist, sit to stand; +2 physical assist stand pivot transfers; min assist, ambulate 40 feet with a rolling walker; min-to-mod assist, activities of daily living.  PHYSICAL EXAMINATION:  VITAL SIGNS:  Blood pressure 130/58, pulse 46, temperature 98, respirations 21. GENERAL:  This was an alert male.  He was impulsive as well as aphasic. He was able to state his name, but very inconsistent to follow commands. LUNGS:  Clear to auscultation. CARDIAC:  Regular rate and rhythm. ABDOMEN:  Soft, nontender.  Good bowel sounds.  REHABILITATION HOSPITAL COURSE:  The patient was admitted to inpatient rehab services with therapies initiated on a 3-hour daily basis consisting of physical therapy, occupational therapy, and speech therapy with ongoing issues in regard to mobility.  Pertaining to Mr. Mazzocco's left MCA territory infarct, ICA occlusion had undergone mechanical thrombectomy stenting.  He continued on aspirin and Plavix therapy, will follow up with the Neurology Services.  Subcutaneous Lovenox discontinued as patient was not ambulatory.  Venous Doppler studies negative as well  as discontinued about the hematuria since resolved. His diet had been advanced to mechanical soft, tolerated nicely.  Blood pressure is well controlled.  He would follow up with his primary care provider.  In regard to patient's bouts of hematuria, noted BPH, Urology Service was consulted.  Foley catheter tube was inserted.  CT of the abdomen and pelvis showed no definite  source of hematuria.  Hemoglobin and hematocrit remained stable.  There was incidental findings concerning for a small pancreatic neoplasm at the tail.  Plans were made to follow up with Gastroenterology Service, Dr. Benson Norway as an outpatient. Attempts at voiding trial had failed Foley catheter tube, reinserted size 18 Coude.  He would follow up with outpatient Urology Services.  He was advised to began Flomax 3 days prior to his followup with Urology Services as well as Cipro 250 mg b.i.d. 3 days prior to his followup. Proscar had been initiated for BPH.  The patient received weekly collaborative interdisciplinary team conferences to discuss estimated length of stay, family teaching, and any barriers to discharge.  He could ambulate extended distances supervision without the use of assistive device.  He did need cues for safety as well as being impulsive.  He was able to gather belongings for activities of daily living, needing again instructional cues for sequencing.  Speech therapy followup for aphasia.  He was able to communicate basic needs, but requires max assist for multimodal communication to convey yes, no accuracy.  It was discussed at length with his wife, the need for 24- hour supervision for his safety.  DISCHARGE MEDICATIONS:  Included: 1. Xanax 0.5 mg t.i.d. as needed. 2. Norvasc 5 mg p.o. daily. 3. Aspirin 81 mg p.o. daily. 4. Lipitor 40 mg p.o. daily. 5. Plavix 75 mg p.o. daily. 6. Proscar 5 mg p.o. daily. 7. Oxycodone immediate release 10 mg every 6 hours as needed pain     dispense of 60 tablets.  DIET:  Mechanical soft.  SPECIAL INSTRUCTIONS:  The patient will follow up with Dr. [name], call for appointment in regard to pancreatic tail mass and evaluation; Dr. Alysia Penna at the outpatient [facility] on Jun 29, 2014; Dr. Erlinda Hong, Neurology Services 1 month, call for appointment; Dr. [name], Urology Services call for appointment 2-3 weeks.  At that  time, patient will begin Flomax and Cipro 3 days prior to his followup with Urology Services.     Lauraine Rinne, P.A.   ______________________________ Charlett Blake, M.D.    DA/MEDQ  D:  06/04/2014  T:  06/05/2014  Job:  [id number]  cc:   Dr. Quintella Baton D. Benson Norway, Ceiba Jeffie Pollock, M.D. Angus G. Everette Rank, MD

## 2014-06-05 NOTE — Progress Notes (Signed)
Subjective/Complaints: Appreciate GI note   ROS - cannot obtain due to Aphasia Objective: Vital Signs: Blood pressure 152/60, pulse 64, temperature 98.4 F (36.9 C), temperature source Oral, resp. rate 18, weight 103.1 kg (227 lb 4.7 oz), SpO2 99 %. No results found. Results for orders placed or performed during the hospital encounter of 05/26/14 (from the past 72 hour(s))  CBC     Status: Abnormal   Collection Time: 06/02/14  9:51 AM  Result Value Ref Range   WBC 10.4 4.0 - 10.5 K/uL   RBC 3.48 (L) 4.22 - 5.81 MIL/uL   Hemoglobin 9.8 (L) 13.0 - 17.0 g/dL   HCT 30.6 (L) 39.0 - 52.0 %   MCV 87.9 78.0 - 100.0 fL   MCH 28.2 26.0 - 34.0 pg   MCHC 32.0 30.0 - 36.0 g/dL   RDW 14.5 11.5 - 15.5 %   Platelets 250 150 - 400 K/uL  Glucose, capillary     Status: Abnormal   Collection Time: 06/03/14  6:46 AM  Result Value Ref Range   Glucose-Capillary 104 (H) 70 - 99 mg/dL     HEENT: normal Cardio: RRR and no murmur Resp: CTA B/L GI: BS positive, +Distention and no tenderness Extremity:  Edema pedal Skin:   Bruise multiple UE Neuro: Alert/Oriented, , Abnormal Motor 4/5 in the right deltoid, biceps, triceps, grip, hip flexor, knee extensor and ankle dorsiflexor, Aphasic and Apraxic Musc/Skel:  Other no pain with upper extremity or lower extremity active range of motion Gen. no acute distress Standing balance fair Urine clear yellow Assessment/Plan: 1. Functional deficits secondary to left MCA distribution infarct with right hemiparesis and aphasia Plavix restarted urine clear Stable for D/C today F/u PCP in 1-2 weeks F/U GI ~2wks F/U Urology ~3wks F/u PM&R 4 weeks See D/C summary See D/C instructions FIM: FIM - Bathing Bathing Steps Patient Completed: Chest, Right Arm, Left Arm, Abdomen, Left upper leg, Right upper leg, Buttocks, Front perineal area, Right lower leg (including foot), Left lower leg (including foot) Bathing: 5: Set-up assist to: Adjust water temp  FIM - Upper  Body Dressing/Undressing Upper body dressing/undressing steps patient completed: Thread/unthread right sleeve of pullover shirt/dresss, Pull shirt over trunk, Thread/unthread left sleeve of pullover shirt/dress, Put head through opening of pull over shirt/dress Upper body dressing/undressing: 5: Supervision: Safety issues/verbal cues FIM - Lower Body Dressing/Undressing Lower body dressing/undressing steps patient completed: Fasten/unfasten pants, Pull underwear up/down, Thread/unthread left pants leg, Pull pants up/down, Thread/unthread right pants leg, Thread/unthread left underwear leg, Thread/unthread right underwear leg, Don/Doff right shoe, Don/Doff left sock, Fasten/unfasten left shoe, Don/Doff left shoe, Don/Doff right sock, Fasten/unfasten right shoe Lower body dressing/undressing: 5: Supervision: Safety issues/verbal cues  FIM - Toileting Toileting steps completed by patient: Adjust clothing prior to toileting, Performs perineal hygiene, Adjust clothing after toileting Toileting Assistive Devices: Grab bar or rail for support Toileting: 5: Supervision: Safety issues/verbal cues  FIM - Radio producer Devices: Grab bars Toilet Transfers: 6-More than reasonable amt of time  FIM - Control and instrumentation engineer Devices: Arm rests Bed/Chair Transfer: 6: More than reasonable amt of time  FIM - Locomotion: Wheelchair Locomotion: Wheelchair: 0: Activity did not occur FIM - Locomotion: Ambulation Locomotion: Ambulation Assistive Devices: Other (comment) (none) Ambulation/Gait Assistance: 5: Supervision Locomotion: Ambulation: 4: Travels 150 ft or more with minimal assistance (Pt.>75%)  Comprehension Comprehension Mode: Auditory Comprehension: 5-Understands basic 90% of the time/requires cueing < 10% of the time  Expression Expression Mode: Verbal Expression: 3-Expresses basic  50 - 74% of the time/requires cueing 25 - 50% of the time.  Needs to repeat parts of sentences.  Social Interaction Social Interaction Mode: Asleep Social Interaction: 4-Interacts appropriately 75 - 89% of the time - Needs redirection for appropriate language or to initiate interaction.  Problem Solving Problem Solving Mode: Asleep Problem Solving: 4-Solves basic 75 - 89% of the time/requires cueing 10 - 24% of the time  Memory Memory Mode: Asleep Memory: 3-Recognizes or recalls 50 - 74% of the time/requires cueing 25 - 49% of the time  Medical Problem List and Plan: 1. Functional deficits secondary to left MCA territory infarct with ICA occlusion status post mechanical thrombectomy with left ICA stenting, embolic secondary to large vessel atherosclerosis 2.  DVT Prophylaxis/Anticoagulation:  Venous Doppler studies 05/25/2014 negative 3. Pain Management: Tylenol as needed 4. Dysphagia. Dysphagia 1 thin liquid diet. Follow-up speech therapy 5. Neuropsych: This patient is capable of making decisions on his own behalf.  -will schedule trazodone for sleep 6. Skin/Wound Care: Routine skin checks 7. Fluids/Electrolytes/Nutrition: encourage po 8. Hyperlipidemia. Lipitor 9. Hypertension. Allowing for permissive hypertension. No present antihypertensive medication. Patient on Norvasc 5 mg daily, hydrochlorothiazide 25 mg daily prior to admission. Resumed Norvasc as Systolic BP creeping up 10. GERD. Protonix 11. Wheezing: does not appear uncomfortable, sats ok. Afebrile. No cough. Keep an eye on weights  -continue albuteraol neb to tid 12.  Urinary retention, with hematuria, clearing with CBI, CT abd pending, repeat CBC , last Hgb ok 13.  Pancreatic mass at tail, no emergent tx needed , GI consulted , outpt EUS FNA 14.  ABLA no further bleeding LOS (Days) 10 A FACE TO FACE EVALUATION WAS PERFORMED  Caton Popowski E 06/05/2014, 7:55 AM

## 2014-06-05 NOTE — Progress Notes (Signed)
Pt discharged to home at [place] with wife. Discharge instructions given to pt and wife with verbal understanding from wife. Belongings with pt

## 2014-06-06 DIAGNOSIS — Z87891 Personal history of nicotine dependence: Secondary | ICD-10-CM | POA: Diagnosis not present

## 2014-06-06 DIAGNOSIS — K219 Gastro-esophageal reflux disease without esophagitis: Secondary | ICD-10-CM | POA: Diagnosis not present

## 2014-06-06 DIAGNOSIS — I10 Essential (primary) hypertension: Secondary | ICD-10-CM | POA: Diagnosis not present

## 2014-06-06 DIAGNOSIS — I69320 Aphasia following cerebral infarction: Secondary | ICD-10-CM | POA: Diagnosis not present

## 2014-06-06 DIAGNOSIS — N400 Enlarged prostate without lower urinary tract symptoms: Secondary | ICD-10-CM | POA: Diagnosis not present

## 2014-06-06 DIAGNOSIS — I69322 Dysarthria following cerebral infarction: Secondary | ICD-10-CM | POA: Diagnosis not present

## 2014-06-08 DIAGNOSIS — Z5189 Encounter for other specified aftercare: Secondary | ICD-10-CM | POA: Diagnosis not present

## 2014-06-08 DIAGNOSIS — I69322 Dysarthria following cerebral infarction: Secondary | ICD-10-CM | POA: Diagnosis not present

## 2014-06-08 DIAGNOSIS — K219 Gastro-esophageal reflux disease without esophagitis: Secondary | ICD-10-CM | POA: Diagnosis not present

## 2014-06-08 DIAGNOSIS — N400 Enlarged prostate without lower urinary tract symptoms: Secondary | ICD-10-CM | POA: Diagnosis not present

## 2014-06-08 DIAGNOSIS — I69320 Aphasia following cerebral infarction: Secondary | ICD-10-CM | POA: Diagnosis not present

## 2014-06-08 DIAGNOSIS — I10 Essential (primary) hypertension: Secondary | ICD-10-CM | POA: Diagnosis not present

## 2014-06-08 DIAGNOSIS — Z4803 Encounter for change or removal of drains: Secondary | ICD-10-CM | POA: Diagnosis not present

## 2014-06-08 DIAGNOSIS — Z87891 Personal history of nicotine dependence: Secondary | ICD-10-CM | POA: Diagnosis not present

## 2014-06-09 DIAGNOSIS — I69320 Aphasia following cerebral infarction: Secondary | ICD-10-CM | POA: Diagnosis not present

## 2014-06-09 DIAGNOSIS — Z87891 Personal history of nicotine dependence: Secondary | ICD-10-CM | POA: Diagnosis not present

## 2014-06-09 DIAGNOSIS — I69322 Dysarthria following cerebral infarction: Secondary | ICD-10-CM | POA: Diagnosis not present

## 2014-06-09 DIAGNOSIS — N400 Enlarged prostate without lower urinary tract symptoms: Secondary | ICD-10-CM | POA: Diagnosis not present

## 2014-06-09 DIAGNOSIS — I10 Essential (primary) hypertension: Secondary | ICD-10-CM | POA: Diagnosis not present

## 2014-06-09 DIAGNOSIS — K219 Gastro-esophageal reflux disease without esophagitis: Secondary | ICD-10-CM | POA: Diagnosis not present

## 2014-06-10 DIAGNOSIS — I69322 Dysarthria following cerebral infarction: Secondary | ICD-10-CM | POA: Diagnosis not present

## 2014-06-10 DIAGNOSIS — Z87891 Personal history of nicotine dependence: Secondary | ICD-10-CM | POA: Diagnosis not present

## 2014-06-10 DIAGNOSIS — I10 Essential (primary) hypertension: Secondary | ICD-10-CM | POA: Diagnosis not present

## 2014-06-10 DIAGNOSIS — N400 Enlarged prostate without lower urinary tract symptoms: Secondary | ICD-10-CM | POA: Diagnosis not present

## 2014-06-10 DIAGNOSIS — K219 Gastro-esophageal reflux disease without esophagitis: Secondary | ICD-10-CM | POA: Diagnosis not present

## 2014-06-10 DIAGNOSIS — I69320 Aphasia following cerebral infarction: Secondary | ICD-10-CM | POA: Diagnosis not present

## 2014-06-11 DIAGNOSIS — I10 Essential (primary) hypertension: Secondary | ICD-10-CM | POA: Diagnosis not present

## 2014-06-11 DIAGNOSIS — N400 Enlarged prostate without lower urinary tract symptoms: Secondary | ICD-10-CM | POA: Diagnosis not present

## 2014-06-11 DIAGNOSIS — K219 Gastro-esophageal reflux disease without esophagitis: Secondary | ICD-10-CM | POA: Diagnosis not present

## 2014-06-11 DIAGNOSIS — I69320 Aphasia following cerebral infarction: Secondary | ICD-10-CM | POA: Diagnosis not present

## 2014-06-11 DIAGNOSIS — Z87891 Personal history of nicotine dependence: Secondary | ICD-10-CM | POA: Diagnosis not present

## 2014-06-11 DIAGNOSIS — I69322 Dysarthria following cerebral infarction: Secondary | ICD-10-CM | POA: Diagnosis not present

## 2014-06-12 DIAGNOSIS — I6789 Other cerebrovascular disease: Secondary | ICD-10-CM | POA: Diagnosis not present

## 2014-06-15 ENCOUNTER — Emergency Department (HOSPITAL_COMMUNITY)
Admission: EM | Admit: 2014-06-15 | Discharge: 2014-06-16 | Disposition: A | Discharge status: Home / Self Care | Payer: Medicare Other | Attending: Emergency Medicine | Admitting: Emergency Medicine

## 2014-06-15 ENCOUNTER — Encounter (HOSPITAL_COMMUNITY): Payer: Self-pay | Admitting: Emergency Medicine

## 2014-06-15 DIAGNOSIS — N401 Enlarged prostate with lower urinary tract symptoms: Secondary | ICD-10-CM | POA: Diagnosis not present

## 2014-06-15 DIAGNOSIS — N138 Other obstructive and reflux uropathy: Secondary | ICD-10-CM | POA: Diagnosis not present

## 2014-06-15 DIAGNOSIS — Z87891 Personal history of nicotine dependence: Secondary | ICD-10-CM | POA: Diagnosis not present

## 2014-06-15 DIAGNOSIS — N400 Enlarged prostate without lower urinary tract symptoms: Secondary | ICD-10-CM | POA: Diagnosis not present

## 2014-06-15 DIAGNOSIS — I10 Essential (primary) hypertension: Secondary | ICD-10-CM | POA: Insufficient documentation

## 2014-06-15 DIAGNOSIS — Z79899 Other long term (current) drug therapy: Secondary | ICD-10-CM | POA: Diagnosis not present

## 2014-06-15 DIAGNOSIS — I69322 Dysarthria following cerebral infarction: Secondary | ICD-10-CM | POA: Diagnosis not present

## 2014-06-15 DIAGNOSIS — Z7902 Long term (current) use of antithrombotics/antiplatelets: Secondary | ICD-10-CM | POA: Diagnosis not present

## 2014-06-15 DIAGNOSIS — R300 Dysuria: Secondary | ICD-10-CM | POA: Diagnosis present

## 2014-06-15 DIAGNOSIS — K219 Gastro-esophageal reflux disease without esophagitis: Secondary | ICD-10-CM | POA: Diagnosis not present

## 2014-06-15 DIAGNOSIS — H9190 Unspecified hearing loss, unspecified ear: Secondary | ICD-10-CM | POA: Insufficient documentation

## 2014-06-15 DIAGNOSIS — R339 Retention of urine, unspecified: Secondary | ICD-10-CM | POA: Insufficient documentation

## 2014-06-15 DIAGNOSIS — I69320 Aphasia following cerebral infarction: Secondary | ICD-10-CM | POA: Diagnosis not present

## 2014-06-15 DIAGNOSIS — Z7982 Long term (current) use of aspirin: Secondary | ICD-10-CM | POA: Insufficient documentation

## 2014-06-15 DIAGNOSIS — E780 Pure hypercholesterolemia: Secondary | ICD-10-CM | POA: Insufficient documentation

## 2014-06-15 LAB — I-STAT CHEM 8, ED
BUN: 17 mg/dL (ref 6–20)
Calcium, Ion: 1.22 mmol/L (ref 1.13–1.30)
Chloride: 102 mmol/L (ref 101–111)
Creatinine, Ser: 0.8 mg/dL (ref 0.61–1.24)
GLUCOSE: 137 mg/dL — AB (ref 70–99)
HEMATOCRIT: 35 % — AB (ref 39.0–52.0)
Hemoglobin: 11.9 g/dL — ABNORMAL LOW (ref 13.0–17.0)
POTASSIUM: 3.4 mmol/L — AB (ref 3.5–5.1)
Sodium: 138 mmol/L (ref 135–145)
TCO2: 22 mmol/L (ref 0–100)

## 2014-06-15 NOTE — ED Notes (Signed)
Inserted 16 Fr coude catheter, clear urine return, small blood clots at the beginning, patient tolerate well

## 2014-06-15 NOTE — ED Notes (Signed)
Pt c/o urinary retention since 4pm tonight. Pt had foley catheter removed this morning and was able to void for a few hours, then suddenly stopped. Pt had catheter placed due to enlarged prostate. Pt had stroke in April. Pt c/o 8/10 pain in bladder.

## 2014-06-15 NOTE — ED Notes (Signed)
Two RNs attempted to bladder scan the pt, with 43mL results. Pt feels and appears distended, but unable to register amount in bladder.

## 2014-06-16 ENCOUNTER — Encounter (HOSPITAL_COMMUNITY): Payer: Self-pay | Admitting: Emergency Medicine

## 2014-06-16 DIAGNOSIS — K219 Gastro-esophageal reflux disease without esophagitis: Secondary | ICD-10-CM | POA: Diagnosis not present

## 2014-06-16 DIAGNOSIS — I69320 Aphasia following cerebral infarction: Secondary | ICD-10-CM | POA: Diagnosis not present

## 2014-06-16 DIAGNOSIS — I69322 Dysarthria following cerebral infarction: Secondary | ICD-10-CM | POA: Diagnosis not present

## 2014-06-16 DIAGNOSIS — Z87891 Personal history of nicotine dependence: Secondary | ICD-10-CM | POA: Diagnosis not present

## 2014-06-16 DIAGNOSIS — I10 Essential (primary) hypertension: Secondary | ICD-10-CM | POA: Diagnosis not present

## 2014-06-16 DIAGNOSIS — N400 Enlarged prostate without lower urinary tract symptoms: Secondary | ICD-10-CM | POA: Diagnosis not present

## 2014-06-16 LAB — URINALYSIS, ROUTINE W REFLEX MICROSCOPIC
Bilirubin Urine: NEGATIVE
Glucose, UA: NEGATIVE mg/dL
Ketones, ur: NEGATIVE mg/dL
Leukocytes, UA: NEGATIVE
Nitrite: NEGATIVE
Protein, ur: NEGATIVE mg/dL
Specific Gravity, Urine: 1.01 (ref 1.005–1.030)
Urobilinogen, UA: 0.2 mg/dL (ref 0.0–1.0)
pH: 6 (ref 5.0–8.0)

## 2014-06-16 LAB — URINE MICROSCOPIC-ADD ON

## 2014-06-16 MED ORDER — DOXYCYCLINE HYCLATE 100 MG PO CAPS: 100.0000 mg | ORAL_CAPSULE | Freq: Two times a day (BID) | ORAL | Status: DC

## 2014-06-16 NOTE — ED Provider Notes (Signed)
CSN: [id number]     Arrival date & time 06/15/14  2231 History   First MD Initiated Contact with Patient 06/15/14 2305     Chief Complaint  Patient presents with  . Urinary Retention     (Consider location/radiation/quality/duration/timing/severity/associated sxs/prior Treatment) Patient is a 80 y.o. male presenting with dysuria. The history is provided by the patient and the spouse.  Dysuria This is a recurrent problem. The current episode started 12 to 24 hours ago. The problem occurs constantly. The problem has not changed since onset.Pertinent negatives include no chest pain, no headaches and no shortness of breath. Nothing aggravates the symptoms. Nothing relieves the symptoms. The treatment provided no relief.  Difficulty urinating post foley removal by Dr. Jeffie Pollock.  Foley placed and now symptoms improved.  No f/c/r   Past Medical History  Diagnosis Date  . Hypertension   . GERD (gastroesophageal reflux disease)   . Hypercholesteremia   . HOH (hard of hearing)    Past Surgical History  Procedure Laterality Date  . Shoulder surgery Right     torn cartiledge  . Esophagogastroduodenoscopy  10/28/2010    Procedure: ESOPHAGOGASTRODUODENOSCOPY (EGD);  Surgeon: [name], MD;  [facility]: [facility];  Service: Endoscopy;  Laterality: N/A;  10:30  . Colonoscopy    . Colonoscopy  05/17/2011    Procedure: COLONOSCOPY;  Surgeon: [name], MD;  Location: [facility];  Service: Endoscopy;  Laterality: N/A;  830  . Resection distal clavical Right 12/13/2012    Procedure: RESECTION DISTAL CLAVICAL;  Surgeon: Carole Civil, MD;  Location: [facility];  Service: Orthopedics;  Laterality: Right;  . Shoulder arthroscopy with bicepstenotomy Right 12/13/2012    Procedure: SHOULDER ARTHROSCOPY WITH BICEPS TENOTOMY, limited shoulder debridement;  Surgeon: Carole Civil, MD;  Location: [facility];  Service: Orthopedics;  Laterality: Right;  . Esophagogastroduodenoscopy N/A 04/24/2014   Procedure: ESOPHAGOGASTRODUODENOSCOPY (EGD);  Surgeon: [name], MD;  Location: [facility];  Service: Endoscopy;  Laterality: N/A;  815  . Radiology with anesthesia N/A 05/23/2014    Procedure: RADIOLOGY WITH ANESTHESIA;  Surgeon: Medication Radiologist, MD;  Location: [facility];  Service: Radiology;  Laterality: N/A;   Family History  Problem Relation Age of Onset  . Colon cancer Neg Hx    History  Substance Use Topics  . Smoking status: Former Smoker -- 0.50 packs/day for 35 years    Types: Cigarettes    Quit date: 12/09/1968  . Smokeless tobacco: Not on file  . Alcohol Use: 1.8 oz/week    3 Glasses of wine per week    Review of Systems  Constitutional: Negative for fever.  Respiratory: Negative for shortness of breath.   Cardiovascular: Negative for chest pain.  Genitourinary: Positive for dysuria and difficulty urinating. Negative for frequency and hematuria.  Neurological: Negative for headaches.  All other systems reviewed and are negative.     Allergies  Review of patient's allergies indicates no known allergies.  Home Medications   Prior to Admission medications   Medication Sig Start Date End Date Taking? Authorizing Provider  ALPRAZolam Duanne Moron) 0.5 MG tablet Take 1 tablet (0.5 mg total) by mouth 3 (three) times daily as needed for anxiety. 06/05/14  Yes Daniel J Angiulli, PA-C  amLODipine (NORVASC) 5 MG tablet Take 1 tablet (5 mg total) by mouth daily. 06/05/14  Yes Daniel J Angiulli, PA-C  aspirin EC 81 MG EC tablet Take 1 tablet (81 mg total) by mouth daily. 06/05/14  Yes Lavon Paganini  Angiulli, PA-C  atorvastatin (LIPITOR) 40 MG tablet Take 1 tablet (40 mg total) by mouth every evening. 06/05/14  Yes Lavon Paganini Angiulli, PA-C  clopidogrel (PLAVIX) 75 MG tablet Take 1 tablet (75 mg total) by mouth daily. 06/05/14  Yes Daniel J Angiulli, PA-C  esomeprazole (NEXIUM) 40 MG capsule Take 40 mg by mouth daily.   Yes Historical Provider, MD  finasteride (PROSCAR) 5 MG tablet  Take 1 tablet (5 mg total) by mouth daily. 06/03/14  Yes [name], MD  oxyCODONE 10 MG TABS Take 1 tablet (10 mg total) by mouth every 6 (six) hours as needed for moderate pain. 06/05/14  Yes Daniel J Angiulli, PA-C  tamsulosin (FLOMAX) 0.4 MG CAPS capsule Take 1 capsule (0.4 mg total) by mouth daily. 06/03/14  Yes [name], MD   BP 155/72 mmHg  Pulse 93  Temp(Src) 98.6 F (37 C) (Oral)  Resp 22  SpO2 98% Physical Exam  Constitutional: He is oriented to person, place, and time. He appears well-developed and well-nourished. No distress.  HENT:  Head: Normocephalic and atraumatic.  Mouth/Throat: Oropharynx is clear and moist.  Eyes: Conjunctivae are normal. Pupils are equal, round, and reactive to light.  Neck: Normal range of motion. Neck supple.  Cardiovascular: Normal rate, regular rhythm and intact distal pulses.   Pulmonary/Chest: Effort normal and breath sounds normal. No respiratory distress. He has no wheezes. He has no rales.  Abdominal: Soft. Bowel sounds are normal. There is no tenderness. There is no rebound and no guarding.  Musculoskeletal: Normal range of motion.  Neurological: He is alert and oriented to person, place, and time.  Skin: Skin is warm and dry.  Psychiatric: He has a normal mood and affect.    ED Course  Procedures (including critical care time) Labs Review Labs Reviewed  URINALYSIS, ROUTINE W REFLEX MICROSCOPIC - Abnormal; Notable for the following:    APPearance CLOUDY (*)    Hgb urine dipstick LARGE (*)    All other components within normal limits  I-STAT CHEM 8, ED - Abnormal; Notable for the following:    Potassium 3.4 (*)    Glucose, Bld 137 (*)    Hemoglobin 11.9 (*)    HCT 35.0 (*)    All other components within normal limits  URINE MICROSCOPIC-ADD ON    Imaging Review No results found.   EKG Interpretation None      MDM   Final diagnoses:  None    Foley catheter draining call to be seen by urology in am.  Leg bag placed  will cover with doxycycline    Laykin Rainone, MD 06/16/14 5188

## 2014-06-16 NOTE — Discharge Instructions (Signed)
Acute Urinary Retention  Acute urinary retention is when you are unable to pee (urinate). Acute urinary retention is common in older men. Prostates can get bigger, which blocks the flow of pee.   HOME CARE  · Drink enough fluids to keep your pee clear or pale yellow.  · If you are sent home with a tube that drains the bladder (catheter), there will be a drainage bag attached to it. There are two types of bags. One is big that you can wear at night without having to empty it. One is smaller and needs to be emptied more often.  ¨ Keep the drainage bag empty.  ¨ Keep the drainage bag lower than your catheter.  · Only take medicine as told by your doctor.  GET HELP IF:  · You have a low-grade fever.  · You have spasms or you are leaking pee when you have spasms.  GET HELP RIGHT AWAY IF:   · You have chills or a fever.  · Your catheter stops draining pee.  · Your catheter falls out.  · You have increased bleeding that does not stop after you have rested and increased the amount of fluids you had been drinking.  MAKE SURE YOU:   · Understand these instructions.  · Will watch your condition.  · Will get help right away if you are not doing well or get worse.  Document Released: 07/19/2007 Document Revised: 11/20/2012 Document Reviewed: 07/11/2012  ExitCare® Patient Information ©2015 [employer]. This information is not intended to replace advice given to you by your health care provider. Make sure you discuss any questions you have with your health care provider.

## 2014-06-17 ENCOUNTER — Encounter (HOSPITAL_COMMUNITY): Payer: Self-pay | Admitting: *Deleted

## 2014-06-17 ENCOUNTER — Telehealth: Payer: Self-pay | Admitting: Neurology

## 2014-06-17 ENCOUNTER — Emergency Department (HOSPITAL_COMMUNITY)
Admission: EM | Admit: 2014-06-17 | Discharge: 2014-06-18 | Disposition: A | Discharge status: Home / Self Care | Payer: Medicare Other | Attending: Emergency Medicine | Admitting: Emergency Medicine

## 2014-06-17 DIAGNOSIS — N400 Enlarged prostate without lower urinary tract symptoms: Secondary | ICD-10-CM | POA: Diagnosis not present

## 2014-06-17 DIAGNOSIS — Z7982 Long term (current) use of aspirin: Secondary | ICD-10-CM | POA: Diagnosis not present

## 2014-06-17 DIAGNOSIS — Y846 Urinary catheterization as the cause of abnormal reaction of the patient, or of later complication, without mention of misadventure at the time of the procedure: Secondary | ICD-10-CM | POA: Insufficient documentation

## 2014-06-17 DIAGNOSIS — I69320 Aphasia following cerebral infarction: Secondary | ICD-10-CM | POA: Diagnosis not present

## 2014-06-17 DIAGNOSIS — T83098A Other mechanical complication of other indwelling urethral catheter, initial encounter: Secondary | ICD-10-CM | POA: Insufficient documentation

## 2014-06-17 DIAGNOSIS — H9190 Unspecified hearing loss, unspecified ear: Secondary | ICD-10-CM | POA: Insufficient documentation

## 2014-06-17 DIAGNOSIS — Z87891 Personal history of nicotine dependence: Secondary | ICD-10-CM | POA: Diagnosis not present

## 2014-06-17 DIAGNOSIS — Z9889 Other specified postprocedural states: Secondary | ICD-10-CM | POA: Insufficient documentation

## 2014-06-17 DIAGNOSIS — E780 Pure hypercholesterolemia: Secondary | ICD-10-CM | POA: Diagnosis not present

## 2014-06-17 DIAGNOSIS — I10 Essential (primary) hypertension: Secondary | ICD-10-CM | POA: Diagnosis not present

## 2014-06-17 DIAGNOSIS — R339 Retention of urine, unspecified: Secondary | ICD-10-CM

## 2014-06-17 DIAGNOSIS — Z79899 Other long term (current) drug therapy: Secondary | ICD-10-CM | POA: Diagnosis not present

## 2014-06-17 DIAGNOSIS — I69322 Dysarthria following cerebral infarction: Secondary | ICD-10-CM | POA: Diagnosis not present

## 2014-06-17 DIAGNOSIS — K219 Gastro-esophageal reflux disease without esophagitis: Secondary | ICD-10-CM | POA: Diagnosis not present

## 2014-06-17 DIAGNOSIS — R933 Abnormal findings on diagnostic imaging of other parts of digestive tract: Secondary | ICD-10-CM | POA: Diagnosis not present

## 2014-06-17 DIAGNOSIS — Z7901 Long term (current) use of anticoagulants: Secondary | ICD-10-CM | POA: Diagnosis not present

## 2014-06-17 DIAGNOSIS — T83091A Other mechanical complication of indwelling urethral catheter, initial encounter: Secondary | ICD-10-CM

## 2014-06-17 LAB — URINALYSIS, ROUTINE W REFLEX MICROSCOPIC
Bilirubin Urine: NEGATIVE
Glucose, UA: NEGATIVE mg/dL
Ketones, ur: NEGATIVE mg/dL
Nitrite: NEGATIVE
Protein, ur: NEGATIVE mg/dL
SPECIFIC GRAVITY, URINE: 1.008 (ref 1.005–1.030)
UROBILINOGEN UA: 0.2 mg/dL (ref 0.0–1.0)
pH: 6 (ref 5.0–8.0)

## 2014-06-17 LAB — URINE MICROSCOPIC-ADD ON

## 2014-06-17 NOTE — ED Notes (Signed)
Pt reports urinary retention.  Has a foley catheter in place.  Pt reports the last time he emptied the bag was before supper at 1730-1800 tonight and noticed blood clot.  Pt also reports pain.

## 2014-06-17 NOTE — Telephone Encounter (Signed)
Dr. [name] called requesting to speak with DR. Jaynee Eagles regarding this patient. Is not urgent but the patient was seen by Dr. Benson Norway today and wanted to discuss a couple of things. Please call and advice # [phone]

## 2014-06-17 NOTE — Telephone Encounter (Signed)
Looks like Dr. Erlinda Hong called and spoke to Dr. Benson Norway. This is Dr. Clydene Fake patient. Thanks Dr. Oneal Grout, this patient needs to follow up with Dr. Erlinda Hong or Dr. Leonie Man in the office since he is a very complicated stroke patient. Would you please reschedule him and call patient? Discussed with Dr. Erlinda Hong. Thank you.

## 2014-06-17 NOTE — Telephone Encounter (Signed)
Dr. Phoebe Sharps note, thank you

## 2014-06-17 NOTE — Telephone Encounter (Signed)
Talked with Dr. Benson Norway over the phone that pt was recently found on CT abdomen concerning for pancreatic mass which need MRCP procedure. He is on ASA and plavix for his recent  left MCA stroke due to left ICA occlusion s/p ICA stent.   Although there is risk for pt to hold off plavix due to left ICA stent, pt needs the procedure urgently to rule out malignancy. I would recommend to continue the ASA and put back plavix as soon as pt stable after the procedure. Will repeat carotid doppler when pt follow up in clinic.   Rosalin Hawking, MD PhD Stroke Neurology 06/17/2014 11:21 AM

## 2014-06-18 ENCOUNTER — Telehealth: Payer: Self-pay | Admitting: *Deleted

## 2014-06-18 DIAGNOSIS — N401 Enlarged prostate with lower urinary tract symptoms: Secondary | ICD-10-CM | POA: Diagnosis not present

## 2014-06-18 DIAGNOSIS — R339 Retention of urine, unspecified: Secondary | ICD-10-CM | POA: Diagnosis not present

## 2014-06-18 DIAGNOSIS — R310 Gross hematuria: Secondary | ICD-10-CM | POA: Diagnosis not present

## 2014-06-18 NOTE — Telephone Encounter (Signed)
Spoke with wife and she stated she would be more comfortable keeping appt with Dr. Jaynee Eagles on Jul 08, 2014 rather than having appt with Dr. Erlinda Hong on August 28, 2014. I told her I would let Dr. Jaynee Eagles know and we would call her back. Pt verbalized understanding.

## 2014-06-18 NOTE — Telephone Encounter (Signed)
I may be able to see him on 06/29/14 2:30pm. Could you let him know to see if this time is ok for him. Thanks.  Rosalin Hawking, MD PhD Stroke Neurology 06/18/2014 1:42 PM

## 2014-06-18 NOTE — Telephone Encounter (Signed)
Called pt and left a message. Gave [facility] phone number and office hours. Calling about setting up appointment with Dr. Erlinda Hong.

## 2014-06-18 NOTE — Telephone Encounter (Signed)
Patients wife would like for you to call her on her cell phone [phone])

## 2014-06-18 NOTE — ED Notes (Signed)
AVS explained in detail, has Urology appointment tomorrow. Pt requests to be sent home with drainage bag. Has leg bag at home-demonstrates proper urethral catheter care. No other c/c. Foley actively draining at this time. Drainage bag emptied before patient left.

## 2014-06-18 NOTE — Telephone Encounter (Signed)
Patients wife returned call. Please call back when able.

## 2014-06-18 NOTE — Telephone Encounter (Signed)
Dr. Erlinda Hong - can you please fit in this complicated patient into your schedule please this month? I would really,really appreciate it. Thank you.

## 2014-06-18 NOTE — ED Provider Notes (Signed)
CSN: [id number]     Arrival date & time 06/17/14  2220 History   First MD Initiated Contact with Patient 06/17/14 2301     Chief Complaint  Patient presents with  . Urinary Retention     (Consider location/radiation/quality/duration/timing/severity/associated sxs/prior Treatment) HPI 80 year old male presents to emergency bur with complaint of catheter obstruction.  Patient recently has had a Foley catheter placed after having a stroke.  He has history of prostate enlargement.  He had catheter removal on Monday at clinic, did well until Monday evening when it became blocked.  He returned to the emergency department and had a new catheter placed.  Patient returns tonight with complaint of inability to urinate.  He has a point with urology in the morning.  Patient reports that he has been passing some blood in his Foley bag.  He denies any pain aside from pain from urinary distention.  No fever or chills. Past Medical History  Diagnosis Date  . Hypertension   . GERD (gastroesophageal reflux disease)   . Hypercholesteremia   . HOH (hard of hearing)    Past Surgical History  Procedure Laterality Date  . Shoulder surgery Right     torn cartiledge  . Esophagogastroduodenoscopy  10/28/2010    Procedure: ESOPHAGOGASTRODUODENOSCOPY (EGD);  Surgeon: [name], MD;  [facility]: [facility];  Service: Endoscopy;  Laterality: N/A;  10:30  . Colonoscopy    . Colonoscopy  05/17/2011    Procedure: COLONOSCOPY;  Surgeon: [name], MD;  Location: [facility];  Service: Endoscopy;  Laterality: N/A;  830  . Resection distal clavical Right 12/13/2012    Procedure: RESECTION DISTAL CLAVICAL;  Surgeon: Carole Civil, MD;  Location: [facility];  Service: Orthopedics;  Laterality: Right;  . Shoulder arthroscopy with bicepstenotomy Right 12/13/2012    Procedure: SHOULDER ARTHROSCOPY WITH BICEPS TENOTOMY, limited shoulder debridement;  Surgeon: Carole Civil, MD;  Location: [facility];  Service:  Orthopedics;  Laterality: Right;  . Esophagogastroduodenoscopy N/A 04/24/2014    Procedure: ESOPHAGOGASTRODUODENOSCOPY (EGD);  Surgeon: [name], MD;  Location: [facility];  Service: Endoscopy;  Laterality: N/A;  815  . Radiology with anesthesia N/A 05/23/2014    Procedure: RADIOLOGY WITH ANESTHESIA;  Surgeon: Medication Radiologist, MD;  Location: [place];  Service: Radiology;  Laterality: N/A;   Family History  Problem Relation Age of Onset  . Colon cancer Neg Hx    History  Substance Use Topics  . Smoking status: Former Smoker -- 0.50 packs/day for 35 years    Types: Cigarettes    Quit date: 12/09/1968  . Smokeless tobacco: Not on file  . Alcohol Use: 1.8 oz/week    3 Glasses of wine per week    Review of Systems  See History of Present Illness; otherwise all other systems are reviewed and negative   Allergies  Review of patient's allergies indicates no known allergies.  Home Medications   Prior to Admission medications   Medication Sig Start Date End Date Taking? Authorizing Provider  ALPRAZolam Duanne Moron) 0.5 MG tablet Take 1 tablet (0.5 mg total) by mouth 3 (three) times daily as needed for anxiety. 06/05/14  Yes Daniel J Angiulli, PA-C  amLODipine (NORVASC) 5 MG tablet Take 1 tablet (5 mg total) by mouth daily. 06/05/14  Yes Daniel J Angiulli, PA-C  aspirin EC 81 MG EC tablet Take 1 tablet (81 mg total) by mouth daily. 06/05/14  Yes Daniel J Angiulli, PA-C  atorvastatin (LIPITOR) 40 MG tablet Take 1  tablet (40 mg total) by mouth every evening. 06/05/14  Yes Lavon Paganini Angiulli, PA-C  clopidogrel (PLAVIX) 75 MG tablet Take 1 tablet (75 mg total) by mouth daily. 06/05/14  Yes Daniel J Angiulli, PA-C  diphenhydramine-acetaminophen (TYLENOL PM) 25-500 MG TABS Take 2 tablets by mouth daily as needed (pain).   Yes Historical Provider, MD  esomeprazole (NEXIUM) 40 MG capsule Take 40 mg by mouth daily.   Yes Historical Provider, MD  finasteride (PROSCAR) 5 MG tablet Take 1 tablet (5  mg total) by mouth daily. 06/03/14  Yes [name], MD  ibuprofen (ADVIL,MOTRIN) 200 MG tablet Take 400 mg by mouth every 6 (six) hours as needed for moderate pain (pain).   Yes Historical Provider, MD  silodosin (RAPAFLO) 8 MG CAPS capsule Take 8 mg by mouth daily with breakfast.   Yes Historical Provider, MD  doxycycline (VIBRAMYCIN) 100 MG capsule Take 1 capsule (100 mg total) by mouth 2 (two) times daily. One po bid x 7 days 06/16/14   April Palumbo, MD  oxyCODONE 10 MG TABS Take 1 tablet (10 mg total) by mouth every 6 (six) hours as needed for moderate pain. Patient not taking: Reported on 06/17/2014 06/05/14   Lavon Paganini Angiulli, PA-C  tamsulosin (FLOMAX) 0.4 MG CAPS capsule Take 1 capsule (0.4 mg total) by mouth daily. Patient not taking: Reported on 06/17/2014 06/03/14   [name], MD   BP 138/59 mmHg  Pulse 78  Temp(Src) 98.4 F (36.9 C) (Oral)  Resp 16  SpO2 96% Physical Exam  Constitutional: He is oriented to person, place, and time. He appears well-developed and well-nourished.  Patient has had replacement of his Foley catheter prior to my evaluation.  He is in no distress at this time.  HENT:  Head: Normocephalic and atraumatic.  Nose: Nose normal.  Mouth/Throat: Oropharynx is clear and moist.  Eyes: Conjunctivae and EOM are normal. Pupils are equal, round, and reactive to light.  Neck: Normal range of motion. Neck supple. No JVD present. No tracheal deviation present. No thyromegaly present.  Cardiovascular: Normal rate, regular rhythm, normal heart sounds and intact distal pulses.  Exam reveals no gallop and no friction rub.   No murmur heard. Pulmonary/Chest: Effort normal and breath sounds normal. No stridor. No respiratory distress. He has no wheezes. He has no rales. He exhibits no tenderness.  Abdominal: Soft. Bowel sounds are normal. He exhibits no distension and no mass. There is no tenderness. There is no rebound and no guarding.  Musculoskeletal: Normal range of motion. He  exhibits no edema or tenderness.  Lymphadenopathy:    He has no cervical adenopathy.  Neurological: He is alert and oriented to person, place, and time. He displays normal reflexes. He exhibits normal muscle tone. Coordination normal.  Skin: Skin is warm and dry. No rash noted. No erythema. No pallor.  Psychiatric: He has a normal mood and affect. His behavior is normal. Judgment and thought content normal.  Nursing note and vitals reviewed.   ED Course  Procedures (including critical care time) Labs Review Labs Reviewed  URINALYSIS, ROUTINE W REFLEX MICROSCOPIC - Abnormal; Notable for the following:    APPearance CLOUDY (*)    Hgb urine dipstick LARGE (*)    Leukocytes, UA SMALL (*)    All other components within normal limits  URINE MICROSCOPIC-ADD ON    Imaging Review No results found.   EKG Interpretation None      MDM   Final diagnoses:  Urinary retention  Obstructed Foley  catheter, initial encounter    80 yo male with foley catheter obstruction.  Catheter has been replaced, draining well.  He has not compliants.  He has f/u with urology in am.    Linton Flemings, MD 06/18/14 [phone]

## 2014-06-18 NOTE — Discharge Instructions (Signed)
Acute Urinary Retention  Acute urinary retention is when you are unable to pee (urinate). Acute urinary retention is common in older men. Prostates can get bigger, which blocks the flow of pee.   HOME CARE  · Drink enough fluids to keep your pee clear or pale yellow.  · If you are sent home with a tube that drains the bladder (catheter), there will be a drainage bag attached to it. There are two types of bags. One is big that you can wear at night without having to empty it. One is smaller and needs to be emptied more often.  ¨ Keep the drainage bag empty.  ¨ Keep the drainage bag lower than your catheter.  · Only take medicine as told by your doctor.  GET HELP IF:  · You have a low-grade fever.  · You have spasms or you are leaking pee when you have spasms.  GET HELP RIGHT AWAY IF:   · You have chills or a fever.  · Your catheter stops draining pee.  · Your catheter falls out.  · You have increased bleeding that does not stop after you have rested and increased the amount of fluids you had been drinking.  MAKE SURE YOU:   · Understand these instructions.  · Will watch your condition.  · Will get help right away if you are not doing well or get worse.  Document Released: 07/19/2007 Document Revised: 11/20/2012 Document Reviewed: 07/11/2012  ExitCare® Patient Information ©2015 [employer]. This information is not intended to replace advice given to you by your health care provider. Make sure you discuss any questions you have with your health care provider.

## 2014-06-18 NOTE — Telephone Encounter (Signed)
Called and spoke with wife and they can change appointment to Jun 29, 2014 at 2:30 with Dr. Erlinda Hong.

## 2014-06-19 DIAGNOSIS — K219 Gastro-esophageal reflux disease without esophagitis: Secondary | ICD-10-CM | POA: Diagnosis not present

## 2014-06-19 DIAGNOSIS — I10 Essential (primary) hypertension: Secondary | ICD-10-CM | POA: Diagnosis not present

## 2014-06-19 DIAGNOSIS — R310 Gross hematuria: Secondary | ICD-10-CM | POA: Diagnosis not present

## 2014-06-19 DIAGNOSIS — Z87891 Personal history of nicotine dependence: Secondary | ICD-10-CM | POA: Diagnosis not present

## 2014-06-19 DIAGNOSIS — I69322 Dysarthria following cerebral infarction: Secondary | ICD-10-CM | POA: Diagnosis not present

## 2014-06-19 DIAGNOSIS — I69320 Aphasia following cerebral infarction: Secondary | ICD-10-CM | POA: Diagnosis not present

## 2014-06-19 DIAGNOSIS — N400 Enlarged prostate without lower urinary tract symptoms: Secondary | ICD-10-CM | POA: Diagnosis not present

## 2014-06-22 ENCOUNTER — Other Ambulatory Visit: Payer: Self-pay | Admitting: Gastroenterology

## 2014-06-22 DIAGNOSIS — I6789 Other cerebrovascular disease: Secondary | ICD-10-CM | POA: Diagnosis not present

## 2014-06-22 DIAGNOSIS — K219 Gastro-esophageal reflux disease without esophagitis: Secondary | ICD-10-CM | POA: Diagnosis not present

## 2014-06-22 DIAGNOSIS — N400 Enlarged prostate without lower urinary tract symptoms: Secondary | ICD-10-CM | POA: Diagnosis not present

## 2014-06-22 DIAGNOSIS — I10 Essential (primary) hypertension: Secondary | ICD-10-CM | POA: Diagnosis not present

## 2014-06-22 DIAGNOSIS — I69320 Aphasia following cerebral infarction: Secondary | ICD-10-CM | POA: Diagnosis not present

## 2014-06-22 DIAGNOSIS — R319 Hematuria, unspecified: Secondary | ICD-10-CM | POA: Diagnosis not present

## 2014-06-22 DIAGNOSIS — I69322 Dysarthria following cerebral infarction: Secondary | ICD-10-CM | POA: Diagnosis not present

## 2014-06-22 DIAGNOSIS — Z87891 Personal history of nicotine dependence: Secondary | ICD-10-CM | POA: Diagnosis not present

## 2014-06-23 ENCOUNTER — Telehealth (HOSPITAL_COMMUNITY): Payer: Self-pay | Admitting: *Deleted

## 2014-06-23 DIAGNOSIS — Z87891 Personal history of nicotine dependence: Secondary | ICD-10-CM

## 2014-06-23 DIAGNOSIS — K219 Gastro-esophageal reflux disease without esophagitis: Secondary | ICD-10-CM | POA: Diagnosis not present

## 2014-06-23 DIAGNOSIS — I6789 Other cerebrovascular disease: Secondary | ICD-10-CM | POA: Diagnosis not present

## 2014-06-23 DIAGNOSIS — I10 Essential (primary) hypertension: Secondary | ICD-10-CM | POA: Diagnosis not present

## 2014-06-23 DIAGNOSIS — R319 Hematuria, unspecified: Secondary | ICD-10-CM | POA: Diagnosis not present

## 2014-06-23 DIAGNOSIS — I69322 Dysarthria following cerebral infarction: Secondary | ICD-10-CM | POA: Diagnosis not present

## 2014-06-23 DIAGNOSIS — N401 Enlarged prostate with lower urinary tract symptoms: Secondary | ICD-10-CM | POA: Diagnosis not present

## 2014-06-23 DIAGNOSIS — I69320 Aphasia following cerebral infarction: Secondary | ICD-10-CM | POA: Diagnosis not present

## 2014-06-23 DIAGNOSIS — N400 Enlarged prostate without lower urinary tract symptoms: Secondary | ICD-10-CM

## 2014-06-24 ENCOUNTER — Encounter (HOSPITAL_COMMUNITY): Payer: Self-pay | Admitting: *Deleted

## 2014-06-24 DIAGNOSIS — I69320 Aphasia following cerebral infarction: Secondary | ICD-10-CM | POA: Diagnosis not present

## 2014-06-24 DIAGNOSIS — Z87891 Personal history of nicotine dependence: Secondary | ICD-10-CM | POA: Diagnosis not present

## 2014-06-24 DIAGNOSIS — K219 Gastro-esophageal reflux disease without esophagitis: Secondary | ICD-10-CM | POA: Diagnosis not present

## 2014-06-24 DIAGNOSIS — N400 Enlarged prostate without lower urinary tract symptoms: Secondary | ICD-10-CM | POA: Diagnosis not present

## 2014-06-24 DIAGNOSIS — I10 Essential (primary) hypertension: Secondary | ICD-10-CM | POA: Diagnosis not present

## 2014-06-24 DIAGNOSIS — I69322 Dysarthria following cerebral infarction: Secondary | ICD-10-CM | POA: Diagnosis not present

## 2014-06-24 NOTE — Progress Notes (Signed)
Consulted with Dr. Delma Post , Anesthesia about patient's medical history and per Dr. Delma Post, Anesthesia will evaluate patient morning of procedure.

## 2014-06-24 NOTE — Progress Notes (Signed)
06/22/2014 and 06/23/2014-Last office visit from Dr. Everette Rank and EKG on chart.

## 2014-06-25 DIAGNOSIS — Z87891 Personal history of nicotine dependence: Secondary | ICD-10-CM | POA: Diagnosis not present

## 2014-06-25 DIAGNOSIS — I69320 Aphasia following cerebral infarction: Secondary | ICD-10-CM | POA: Diagnosis not present

## 2014-06-25 DIAGNOSIS — I69322 Dysarthria following cerebral infarction: Secondary | ICD-10-CM | POA: Diagnosis not present

## 2014-06-25 DIAGNOSIS — I10 Essential (primary) hypertension: Secondary | ICD-10-CM | POA: Diagnosis not present

## 2014-06-25 DIAGNOSIS — N138 Other obstructive and reflux uropathy: Secondary | ICD-10-CM | POA: Diagnosis not present

## 2014-06-25 DIAGNOSIS — N401 Enlarged prostate with lower urinary tract symptoms: Secondary | ICD-10-CM | POA: Diagnosis not present

## 2014-06-25 DIAGNOSIS — R310 Gross hematuria: Secondary | ICD-10-CM | POA: Diagnosis not present

## 2014-06-25 DIAGNOSIS — N400 Enlarged prostate without lower urinary tract symptoms: Secondary | ICD-10-CM | POA: Diagnosis not present

## 2014-06-25 DIAGNOSIS — D689 Coagulation defect, unspecified: Secondary | ICD-10-CM | POA: Diagnosis not present

## 2014-06-25 DIAGNOSIS — K219 Gastro-esophageal reflux disease without esophagitis: Secondary | ICD-10-CM | POA: Diagnosis not present

## 2014-06-26 ENCOUNTER — Ambulatory Visit (HOSPITAL_COMMUNITY): Payer: Medicare Other | Admitting: Anesthesiology

## 2014-06-26 ENCOUNTER — Ambulatory Visit (HOSPITAL_COMMUNITY)
Admission: RE | Admit: 2014-06-26 | Discharge: 2014-06-26 | Disposition: A | Discharge status: Home / Self Care | Payer: Medicare Other | Source: Ambulatory Visit | Attending: Gastroenterology | Admitting: Gastroenterology

## 2014-06-26 ENCOUNTER — Encounter (HOSPITAL_COMMUNITY)
Admission: RE | Disposition: A | Discharge status: Home / Self Care | Payer: Self-pay | Source: Ambulatory Visit | Attending: Gastroenterology

## 2014-06-26 ENCOUNTER — Encounter (HOSPITAL_COMMUNITY): Payer: Self-pay

## 2014-06-26 DIAGNOSIS — K219 Gastro-esophageal reflux disease without esophagitis: Secondary | ICD-10-CM | POA: Insufficient documentation

## 2014-06-26 DIAGNOSIS — N281 Cyst of kidney, acquired: Secondary | ICD-10-CM | POA: Diagnosis not present

## 2014-06-26 DIAGNOSIS — I739 Peripheral vascular disease, unspecified: Secondary | ICD-10-CM | POA: Insufficient documentation

## 2014-06-26 DIAGNOSIS — I10 Essential (primary) hypertension: Secondary | ICD-10-CM | POA: Diagnosis not present

## 2014-06-26 DIAGNOSIS — E780 Pure hypercholesterolemia: Secondary | ICD-10-CM | POA: Diagnosis not present

## 2014-06-26 DIAGNOSIS — Z87891 Personal history of nicotine dependence: Secondary | ICD-10-CM | POA: Insufficient documentation

## 2014-06-26 DIAGNOSIS — K8020 Calculus of gallbladder without cholecystitis without obstruction: Secondary | ICD-10-CM | POA: Insufficient documentation

## 2014-06-26 DIAGNOSIS — Z8673 Personal history of transient ischemic attack (TIA), and cerebral infarction without residual deficits: Secondary | ICD-10-CM | POA: Diagnosis not present

## 2014-06-26 DIAGNOSIS — M1990 Unspecified osteoarthritis, unspecified site: Secondary | ICD-10-CM | POA: Diagnosis not present

## 2014-06-26 DIAGNOSIS — R8589 Other abnormal findings in specimens from digestive organs and abdominal cavity: Secondary | ICD-10-CM | POA: Diagnosis present

## 2014-06-26 DIAGNOSIS — K868 Other specified diseases of pancreas: Secondary | ICD-10-CM | POA: Diagnosis not present

## 2014-06-26 DIAGNOSIS — Z7982 Long term (current) use of aspirin: Secondary | ICD-10-CM | POA: Insufficient documentation

## 2014-06-26 DIAGNOSIS — R933 Abnormal findings on diagnostic imaging of other parts of digestive tract: Secondary | ICD-10-CM | POA: Diagnosis not present

## 2014-06-26 HISTORY — PX: EUS: SHX5427

## 2014-06-26 HISTORY — DX: Personal history of transient ischemic attack (TIA), and cerebral infarction without residual deficits: Z86.73

## 2014-06-26 HISTORY — DX: Cerebral infarction, unspecified: I63.9

## 2014-06-26 HISTORY — DX: Unspecified osteoarthritis, unspecified site: M19.90

## 2014-06-26 HISTORY — PX: ESOPHAGOGASTRODUODENOSCOPY: SHX5428

## 2014-06-26 SURGERY — ULTRASOUND, UPPER GI TRACT, ENDOSCOPIC
Anesthesia: Monitor Anesthesia Care

## 2014-06-26 MED ORDER — DEXMEDETOMIDINE BOLUS VIA INFUSION
INTRAVENOUS | Status: DC | PRN
  Administered 2014-06-26: 30 ug via INTRAVENOUS
  Administered 2014-06-26: 16 ug via INTRAVENOUS
  Administered 2014-06-26: 24 ug via INTRAVENOUS

## 2014-06-26 MED ORDER — GLYCOPYRROLATE 0.2 MG/ML IJ SOLN
INTRAMUSCULAR | Status: DC | PRN
  Administered 2014-06-26: .2 mg via INTRAVENOUS

## 2014-06-26 MED ORDER — LACTATED RINGERS IV SOLN
INTRAVENOUS | Status: DC
  Administered 2014-06-26: 09:00:00 via INTRAVENOUS

## 2014-06-26 MED ORDER — GLYCOPYRROLATE 0.2 MG/ML IJ SOLN
INTRAMUSCULAR | Status: AC
  Filled 2014-06-26: qty 1

## 2014-06-26 MED ORDER — SODIUM CHLORIDE 0.9 % IV SOLN
INTRAVENOUS | Status: DC
  Administered 2014-06-26: 1000 mL via INTRAVENOUS

## 2014-06-26 MED ORDER — PHENYLEPHRINE HCL 10 MG/ML IJ SOLN
10.0000 mg | INTRAMUSCULAR | Status: DC | PRN
  Administered 2014-06-26: 20 ug/min via INTRAVENOUS

## 2014-06-26 MED ORDER — DEXMEDETOMIDINE HCL IN NACL 200 MCG/50ML IV SOLN
INTRAVENOUS | Status: DC | PRN
  Administered 2014-06-26: .7 ug/kg/h via INTRAVENOUS

## 2014-06-26 MED ORDER — BUTAMBEN-TETRACAINE-BENZOCAINE 2-2-14 % EX AERO
INHALATION_SPRAY | CUTANEOUS | Status: DC | PRN
  Administered 2014-06-26: 2 via TOPICAL

## 2014-06-26 MED ORDER — LIDOCAINE HCL (CARDIAC) 20 MG/ML IV SOLN
INTRAVENOUS | Status: AC
  Filled 2014-06-26: qty 5

## 2014-06-26 MED ORDER — PROPOFOL 10 MG/ML IV BOLUS
INTRAVENOUS | Status: AC
  Filled 2014-06-26: qty 40

## 2014-06-26 MED ORDER — DEXMEDETOMIDINE HCL IN NACL 200 MCG/50ML IV SOLN
0.4000 ug/kg/h | INTRAVENOUS | Status: DC
  Administered 2014-06-26: .7 ug via INTRAVENOUS
  Filled 2014-06-26: qty 50

## 2014-06-26 MED ORDER — PROPOFOL 10 MG/ML IV BOLUS
INTRAVENOUS | Status: AC
  Filled 2014-06-26: qty 20

## 2014-06-26 MED ORDER — PROPOFOL INFUSION 10 MG/ML OPTIME
INTRAVENOUS | Status: DC | PRN
  Administered 2014-06-26: 100 ug/kg/min via INTRAVENOUS

## 2014-06-26 MED ORDER — LIDOCAINE HCL (CARDIAC) 20 MG/ML IV SOLN
INTRAVENOUS | Status: DC | PRN
  Administered 2014-06-26: 50 mg via INTRAVENOUS

## 2014-06-26 NOTE — Anesthesia Postprocedure Evaluation (Signed)
Anesthesia Post Note  Patient: Henry [name]  Procedure(s) Performed: Procedure(s) (LRB): FULL UPPER ENDOSCOPIC ULTRASOUND (EUS) RADIAL (N/A) ESOPHAGOGASTRODUODENOSCOPY (EGD) (N/A)  Anesthesia type: MAC  Patient location: PACU  Post pain: Pain level controlled  Post assessment: Post-op Vital signs reviewed  Last Vitals: BP 143/73 mmHg  Pulse 58  Temp(Src) 36.5 C (Oral)  Resp 16  Ht 5\' 10"  (1.778 m)  Wt 227 lb (102.967 kg)  BMI 32.57 kg/m2  SpO2 96%  Post vital signs: Reviewed  Level of consciousness: awake  Complications: No apparent anesthesia complications

## 2014-06-26 NOTE — Interval H&P Note (Signed)
History and Physical Interval Note:  06/26/2014 9:00 AM  Henry [name]  has presented today for surgery, with the diagnosis of pancreatic mass  The various methods of treatment have been discussed with the patient and family. After consideration of risks, benefits and other options for treatment, the patient has consented to  Procedure(s): FULL UPPER ENDOSCOPIC ULTRASOUND (EUS) RADIAL (N/A) ESOPHAGOGASTRODUODENOSCOPY (EGD) (N/A) as a surgical intervention .  The patient's history has been reviewed, patient examined, no change in status, stable for surgery.  I have reviewed the patient's chart and labs.  Questions were answered to the patient's satisfaction.     [name]

## 2014-06-26 NOTE — Discharge Instructions (Signed)
Esophagogastroduodenoscopy Care After Refer to this sheet in the next few weeks. These instructions provide you with information on caring for yourself after your procedure. Your caregiver may also give you more specific instructions. Your treatment has been planned according to current medical practices, but problems sometimes occur. Call your caregiver if you have any problems or questions after your procedure.  HOME CARE INSTRUCTIONS  Do not eat or drink anything until the numbing medicine (local anesthetic) has worn off and your gag reflex has returned. You will know that the local anesthetic has worn off when you can swallow comfortably.  Do not drive for 24 hours after the procedure or as directed by your caregiver.  Only take medicines as directed by your caregiver. SEEK MEDICAL CARE IF:   You cannot stop coughing.  You are not urinating at all or less than usual. SEEK IMMEDIATE MEDICAL CARE IF:  You have difficulty swallowing.  You cannot eat or drink.  You have worsening throat or chest pain.  You have dizziness, lightheadedness, or you faint.  You have nausea or vomiting.  You have chills.  You have a fever.  You have severe abdominal pain.  You have black, tarry, or bloody stools. Document Released: 01/17/2012 Document Reviewed: 01/17/2012 [facility] Patient Information 2015 [place]. This information is not intended to replace advice given to you by your health care provider. Make sure you discuss any questions you have with your health care provider.

## 2014-06-26 NOTE — Op Note (Signed)
[facility] [place], [address]   UPPER ENDOSCOPIC ULTRASOUND PROCEDURE REPORT     EXAM DATE: 06/26/2014  PATIENT NAME:          Henry [name], Henry [name]          MR#: [id number] BIRTHDATE:       Mar 01, 1934     VISIT #:     [phone] ATTENDING:     [name], MD     STATUS:     outpatient ASSISTANT:      Cletis Athens, Hilma Favors, and Lilli Light MD: ASA CLASS:        Class III  INDICATIONS:  The patient is a 80 yr old male here for a lower endoscopic ultrasound due to abnormal CT of the GI tract. PROCEDURE PERFORMED:     Upper EUS  MEDICATIONS:     Monitored anesthesia care  CONSENT: The patient understands the risks and benefits of the procedure and understands that these risks include, but are not limited to: sedation, allergic reaction, infection, perforation and/or bleeding. Alternative means of evaluation and treatment include, among others: physical exam, x-rays, and/or surgical intervention. The patient elects to proceed with this endoscopic procedure.  DESCRIPTION OF PROCEDURE: During intra-op preparation period all mechanical & medical equipment was checked for proper function. Hand hygiene and appropriate measures for infection prevention was taken. After the risks, benefits and alternatives of the procedure were thoroughly explained, Informed consent was verified, confirmed and timeout was successfully executed by the treatment team. The patient was then placed in the left, lateral, decubitus position and IV sedation was administered. Throughout the procedure, the patients blood pressure, pulse and oxygen saturations were monitored continuously. Under direct visualization, the    was introduced through the mouth and advanced to the second portion of the duodenum.  Water was used as necessary to provide an acoustic interface. The pulse, BP, and O2 saturation were monitored and documented by the physician and  nursing staff throughout the procedure. Upon completion of the imaging, water was removed and the patient was then discharged to recovery in stable condition with the appropriate post procedure care. Estimated blood loss is zero unless otherwise noted in this procedure report.  FINDINGS:  The SMA and Celiac axis were identified on Image 002, which were normal.  The pancreatic body and tail were normal in Image 011.  In the tail of the pancreas NO mass was identified. Three separate evalutions of the area were performed.  The splenic vessels were clearly identified and traced to the spleen, Image 005.  A couple of left renal cysts were identified in Image 005.  A hyperechoic focus in the neck of the gallbladder was noted.  ? shadowing.  This may be a gallstone, Image 009.  The other portions of the gallbladder were normal.    ADVERSE EVENTS:     There were no immediate complications. IMPRESSIONS:     1) No evidence of a pancreatic tail mass during this EUS examination. 2) Renal cysts. 3) Small 4 mm gallstone in the neck of the gallbladder.   RECOMMENDATIONS:     1) MRI in 3 months. 2) Resume Plavix per Neurology and Urology.  ___________________________________ [name], MD eSigned:  [name], MD 06/26/2014 10:48 AM   cc:      PATIENT NAME:  Dezmon, Conover MR#: [id number]

## 2014-06-26 NOTE — H&P (View-Only) (Signed)
Subjective/Complaints: Appreciate GI note   ROS - cannot obtain due to Aphasia Objective: Vital Signs: Blood pressure 152/60, pulse 64, temperature 98.4 F (36.9 C), temperature source Oral, resp. rate 18, weight 103.1 kg (227 lb 4.7 oz), SpO2 99 %. No results found. Results for orders placed or performed during the hospital encounter of 05/26/14 (from the past 72 hour(s))  CBC     Status: Abnormal   Collection Time: 06/02/14  9:51 AM  Result Value Ref Range   WBC 10.4 4.0 - 10.5 K/uL   RBC 3.48 (L) 4.22 - 5.81 MIL/uL   Hemoglobin 9.8 (L) 13.0 - 17.0 g/dL   HCT 30.6 (L) 39.0 - 52.0 %   MCV 87.9 78.0 - 100.0 fL   MCH 28.2 26.0 - 34.0 pg   MCHC 32.0 30.0 - 36.0 g/dL   RDW 14.5 11.5 - 15.5 %   Platelets 250 150 - 400 K/uL  Glucose, capillary     Status: Abnormal   Collection Time: 06/03/14  6:46 AM  Result Value Ref Range   Glucose-Capillary 104 (H) 70 - 99 mg/dL     HEENT: normal Cardio: RRR and no murmur Resp: CTA B/L GI: BS positive, +Distention and no tenderness Extremity:  Edema pedal Skin:   Bruise multiple UE Neuro: Alert/Oriented, , Abnormal Motor 4/5 in the right deltoid, biceps, triceps, grip, hip flexor, knee extensor and ankle dorsiflexor, Aphasic and Apraxic Musc/Skel:  Other no pain with upper extremity or lower extremity active range of motion Gen. no acute distress Standing balance fair Urine clear yellow Assessment/Plan: 1. Functional deficits secondary to left MCA distribution infarct with right hemiparesis and aphasia Plavix restarted urine clear Stable for D/C today F/u PCP in 1-2 weeks F/U GI ~2wks F/U Urology ~3wks F/u PM&R 4 weeks See D/C summary See D/C instructions FIM: FIM - Bathing Bathing Steps Patient Completed: Chest, Right Arm, Left Arm, Abdomen, Left upper leg, Right upper leg, Buttocks, Front perineal area, Right lower leg (including foot), Left lower leg (including foot) Bathing: 5: Set-up assist to: Adjust water temp  FIM - Upper  Body Dressing/Undressing Upper body dressing/undressing steps patient completed: Thread/unthread right sleeve of pullover shirt/dresss, Pull shirt over trunk, Thread/unthread left sleeve of pullover shirt/dress, Put head through opening of pull over shirt/dress Upper body dressing/undressing: 5: Supervision: Safety issues/verbal cues FIM - Lower Body Dressing/Undressing Lower body dressing/undressing steps patient completed: Fasten/unfasten pants, Pull underwear up/down, Thread/unthread left pants leg, Pull pants up/down, Thread/unthread right pants leg, Thread/unthread left underwear leg, Thread/unthread right underwear leg, Don/Doff right shoe, Don/Doff left sock, Fasten/unfasten left shoe, Don/Doff left shoe, Don/Doff right sock, Fasten/unfasten right shoe Lower body dressing/undressing: 5: Supervision: Safety issues/verbal cues  FIM - Toileting Toileting steps completed by patient: Adjust clothing prior to toileting, Performs perineal hygiene, Adjust clothing after toileting Toileting Assistive Devices: Grab bar or rail for support Toileting: 5: Supervision: Safety issues/verbal cues  FIM - Radio producer Devices: Grab bars Toilet Transfers: 6-More than reasonable amt of time  FIM - Control and instrumentation engineer Devices: Arm rests Bed/Chair Transfer: 6: More than reasonable amt of time  FIM - Locomotion: Wheelchair Locomotion: Wheelchair: 0: Activity did not occur FIM - Locomotion: Ambulation Locomotion: Ambulation Assistive Devices: Other (comment) (none) Ambulation/Gait Assistance: 5: Supervision Locomotion: Ambulation: 4: Travels 150 ft or more with minimal assistance (Pt.>75%)  Comprehension Comprehension Mode: Auditory Comprehension: 5-Understands basic 90% of the time/requires cueing < 10% of the time  Expression Expression Mode: Verbal Expression: 3-Expresses basic  50 - 74% of the time/requires cueing 25 - 50% of the time.  Needs to repeat parts of sentences.  Social Interaction Social Interaction Mode: Asleep Social Interaction: 4-Interacts appropriately 75 - 89% of the time - Needs redirection for appropriate language or to initiate interaction.  Problem Solving Problem Solving Mode: Asleep Problem Solving: 4-Solves basic 75 - 89% of the time/requires cueing 10 - 24% of the time  Memory Memory Mode: Asleep Memory: 3-Recognizes or recalls 50 - 74% of the time/requires cueing 25 - 49% of the time  Medical Problem List and Plan: 1. Functional deficits secondary to left MCA territory infarct with ICA occlusion status post mechanical thrombectomy with left ICA stenting, embolic secondary to large vessel atherosclerosis 2.  DVT Prophylaxis/Anticoagulation:  Venous Doppler studies 05/25/2014 negative 3. Pain Management: Tylenol as needed 4. Dysphagia. Dysphagia 1 thin liquid diet. Follow-up speech therapy 5. Neuropsych: This patient is capable of making decisions on his own behalf.  -will schedule trazodone for sleep 6. Skin/Wound Care: Routine skin checks 7. Fluids/Electrolytes/Nutrition: encourage po 8. Hyperlipidemia. Lipitor 9. Hypertension. Allowing for permissive hypertension. No present antihypertensive medication. Patient on Norvasc 5 mg daily, hydrochlorothiazide 25 mg daily prior to admission. Resumed Norvasc as Systolic BP creeping up 10. GERD. Protonix 11. Wheezing: does not appear uncomfortable, sats ok. Afebrile. No cough. Keep an eye on weights  -continue albuteraol neb to tid 12.  Urinary retention, with hematuria, clearing with CBI, CT abd pending, repeat CBC , last Hgb ok 13.  Pancreatic mass at tail, no emergent tx needed , GI consulted , outpt EUS FNA 14.  ABLA no further bleeding LOS (Days) 10 A FACE TO FACE EVALUATION WAS PERFORMED  Scotty Pinder E 06/05/2014, 7:55 AM

## 2014-06-26 NOTE — Anesthesia Preprocedure Evaluation (Addendum)
Anesthesia Evaluation  Patient identified by MRN, date of birth, ID band Patient awake and Patient confused    Reviewed: Allergy & Precautions, NPO status , Patient's Chart, lab work & pertinent test results, Unable to perform ROS - Chart review only  Airway Mallampati: II  TM Distance: >3 FB     Dental  (+) Teeth Intact   Pulmonary neg pulmonary ROS, former smoker,  breath sounds clear to auscultation        Cardiovascular hypertension, Pt. on medications + Peripheral Vascular Disease negative cardio ROS  Rhythm:Regular Rate:Normal     Neuro/Psych Carotid stent x 2 TIACVA negative psych ROS   GI/Hepatic negative GI ROS, Neg liver ROS, GERD-  Medicated,  Endo/Other  negative endocrine ROS  Renal/GU negative Renal ROS     Musculoskeletal  (+) Arthritis -,   Abdominal   Peds  Hematology negative hematology ROS (+)   Anesthesia Other Findings   Reproductive/Obstetrics negative OB ROS                            Anesthesia Physical  Anesthesia Plan  ASA: III  Anesthesia Plan: MAC   Post-op Pain Management:    Induction: Intravenous  Airway Management Planned:   Additional Equipment:   Intra-op Plan:   Post-operative Plan:   Informed Consent: I have reviewed the patients History and Physical, chart, labs and discussed the procedure including the risks, benefits and alternatives for the proposed anesthesia with the patient or authorized representative who has indicated his/her understanding and acceptance.   Dental advisory given  Plan Discussed with: CRNA  Anesthesia Plan Comments:         Anesthesia Quick Evaluation

## 2014-06-26 NOTE — Transfer of Care (Signed)
Immediate Anesthesia Transfer of Care Note  Patient: Henry [name]  Procedure(s) Performed: Procedure(s): FULL UPPER ENDOSCOPIC ULTRASOUND (EUS) RADIAL (N/A) ESOPHAGOGASTRODUODENOSCOPY (EGD) (N/A)  Patient Location: PACU  Anesthesia Type:MAC  Level of Consciousness: awake, alert , sedated and patient cooperative  Airway & Oxygen Therapy: Patient Spontanous Breathing and Patient connected to face mask oxygen  Post-op Assessment: Report given to RN and Post -op Vital signs reviewed and stable  Post vital signs: stable  Last Vitals:  Filed Vitals:   06/26/14 1045  BP: 124/54  Pulse: 58  Temp:   Resp: 20    Complications: No apparent anesthesia complications

## 2014-06-29 ENCOUNTER — Ambulatory Visit (HOSPITAL_BASED_OUTPATIENT_CLINIC_OR_DEPARTMENT_OTHER): Payer: Medicare Other | Admitting: Physical Medicine & Rehabilitation

## 2014-06-29 ENCOUNTER — Encounter (HOSPITAL_COMMUNITY): Payer: Self-pay | Admitting: Gastroenterology

## 2014-06-29 ENCOUNTER — Ambulatory Visit (INDEPENDENT_AMBULATORY_CARE_PROVIDER_SITE_OTHER): Payer: Medicare Other | Admitting: Neurology

## 2014-06-29 ENCOUNTER — Encounter: Payer: Self-pay | Admitting: Neurology

## 2014-06-29 ENCOUNTER — Encounter: Payer: Medicare Other | Attending: Physical Medicine & Rehabilitation

## 2014-06-29 VITALS — BP 166/88 | HR 56 | Resp 14

## 2014-06-29 VITALS — BP 188/88 | HR 99 | Ht 70.0 in | Wt 223.8 lb

## 2014-06-29 DIAGNOSIS — R339 Retention of urine, unspecified: Secondary | ICD-10-CM | POA: Insufficient documentation

## 2014-06-29 DIAGNOSIS — R319 Hematuria, unspecified: Secondary | ICD-10-CM | POA: Insufficient documentation

## 2014-06-29 DIAGNOSIS — I69920 Aphasia following unspecified cerebrovascular disease: Secondary | ICD-10-CM | POA: Diagnosis not present

## 2014-06-29 DIAGNOSIS — I6523 Occlusion and stenosis of bilateral carotid arteries: Secondary | ICD-10-CM | POA: Insufficient documentation

## 2014-06-29 DIAGNOSIS — I10 Essential (primary) hypertension: Secondary | ICD-10-CM | POA: Insufficient documentation

## 2014-06-29 DIAGNOSIS — E785 Hyperlipidemia, unspecified: Secondary | ICD-10-CM

## 2014-06-29 DIAGNOSIS — I63412 Cerebral infarction due to embolism of left middle cerebral artery: Secondary | ICD-10-CM | POA: Insufficient documentation

## 2014-06-29 DIAGNOSIS — I639 Cerebral infarction, unspecified: Secondary | ICD-10-CM

## 2014-06-29 DIAGNOSIS — I69931 Monoplegia of upper limb following unspecified cerebrovascular disease affecting right dominant side: Secondary | ICD-10-CM

## 2014-06-29 DIAGNOSIS — I69320 Aphasia following cerebral infarction: Secondary | ICD-10-CM

## 2014-06-29 DIAGNOSIS — I6529 Occlusion and stenosis of unspecified carotid artery: Secondary | ICD-10-CM | POA: Insufficient documentation

## 2014-06-29 DIAGNOSIS — Z87891 Personal history of nicotine dependence: Secondary | ICD-10-CM | POA: Insufficient documentation

## 2014-06-29 HISTORY — DX: Essential (primary) hypertension: I10

## 2014-06-29 NOTE — Progress Notes (Signed)
Subjective:    Patient ID: Henry [name], male    DOB: 1935/01/20, 80 y.o.   MRN: [id number]  HPI 80 year old male with history of hypertension and hyperlipidemia who suffered a left MCA distribution CVA on 05/23/2014. He initially was treated at [facility] but then transferred to [facility]. Echocardiogram showed no source of embolism, there was an acutely occluded left ICA stenosis Underwent stenting, transferred to inpatient rehabilitation DATE OF ADMISSION:  05/26/2014 DATE OF DISCHARGE:  06/05/2014  During inpatient rehabilitation stay had acute urinary retention as well as hematuria. Was treated by urology. Sent home with Foley catheter. Has had numerous urology follow-ups. Had outpatient voiding trial. Plans are for TURP Still wearing a leg bag  Incidental finding of pancreatic mass seen on CT abdomen, further GI workup with Endoscopic ultrasound was negative  Completed home health PT and OT, still receiving home health speech and nursing, blood count is still being monitored  Independent dressing and bathing.    Pain Inventory Average Pain 4 Pain Right Now 3 My pain is intermittent, sharp, burning, stabbing and aching  In the last 24 hours, has pain interfered with the following? General activity 4 Relation with others 4 Enjoyment of life 4 What TIME of day is your pain at its worst? ALL Sleep (in general) Fair  Pain is worse with: sitting, standing and some activites Pain improves with: rest Relief from Meds: 2  Mobility walk without assistance how many minutes can you walk? 10 ability to climb steps?  yes do you drive?  no  Function retired  Neuro/Psych bladder control problems weakness trouble walking confusion  Prior Studies Any changes since last visit?  no  Physicians involved in your care Any changes since last visit?  no   Family History  Problem Relation Age of Onset  . Colon cancer Neg Hx    History   Social History  .  Marital Status: Married    Spouse Name: N/A  . Number of Children: N/A  . Years of Education: N/A   Social History Main Topics  . Smoking status: Former Smoker -- 0.50 packs/day for 35 years    Types: Cigarettes    Quit date: 12/10/1994  . Smokeless tobacco: Not on file  . Alcohol Use: 1.8 oz/week    3 Glasses of wine per week     Comment: occassionally  . Drug Use: No  . Sexual Activity: Yes    Birth Control/ Protection: None   Other Topics Concern  . None   Social History Narrative   Past Surgical History  Procedure Laterality Date  . Shoulder surgery Right     torn cartiledge  . Esophagogastroduodenoscopy  10/28/2010    Procedure: ESOPHAGOGASTRODUODENOSCOPY (EGD);  Surgeon: [name], MD;  [facility];  Service: Endoscopy;  Laterality: N/A;  10:30  . Colonoscopy    . Colonoscopy  05/17/2011    Procedure: COLONOSCOPY;  Surgeon: [name], MD;  [facility];  Service: Endoscopy;  Laterality: N/A;  830  . Resection distal clavical Right 12/13/2012    Procedure: RESECTION DISTAL CLAVICAL;  Surgeon: Carole Civil, MD;  Location: [facility];  Service: Orthopedics;  Laterality: Right;  . Shoulder arthroscopy with bicepstenotomy Right 12/13/2012    Procedure: SHOULDER ARTHROSCOPY WITH BICEPS TENOTOMY, limited shoulder debridement;  Surgeon: Carole Civil, MD;  Location: [facility];  Service: Orthopedics;  Laterality: Right;  . Esophagogastroduodenoscopy N/A 04/24/2014    Procedure: ESOPHAGOGASTRODUODENOSCOPY (EGD);  Surgeon: [name], MD;  [facility]: [facility];  Service: Endoscopy;  Laterality: N/A;  815  . Radiology with anesthesia N/A 05/23/2014    Procedure: RADIOLOGY WITH ANESTHESIA;  Surgeon: Medication Radiologist, MD;  Location: [facility];  Service: Radiology;  Laterality: N/A;  . Epidural steroid injection  05/22/2014    had this in lumbar and at 1 am on 05/23/2014 had a stroke  . Carotid stents      2 stents in carotid artery on left  side  . Shoulder surgery      right shoulder  . Eus N/A 06/26/2014    Procedure: FULL UPPER ENDOSCOPIC ULTRASOUND (EUS) RADIAL;  Surgeon: [name], MD;  Location: [facility];  Service: Endoscopy;  Laterality: N/A;  . Esophagogastroduodenoscopy N/A 06/26/2014    Procedure: ESOPHAGOGASTRODUODENOSCOPY (EGD);  Surgeon: [name], MD;  Location: [facility];  Service: Endoscopy;  Laterality: N/A;   Past Medical History  Diagnosis Date  . Hypertension   . GERD (gastroesophageal reflux disease)   . Hypercholesteremia   . HOH (hard of hearing)   . Stroke     had a stroke 05/23/2014  . Arthritis   . Hx-TIA (transient ischemic attack)     hx of this- wife states 10 years ago-but Dr. Estil Daft told wife he had had several-has 2 stents in carotid artery-left   BP 166/88 mmHg  Pulse 56  Resp 14  SpO2 90%  Opioid Risk Score:   Fall Risk Score: Low Fall Risk (0-5 points)`1  Depression screen PHQ 2/9  No flowsheet data found.   Review of Systems  HENT: Negative.   Eyes: Negative.   Respiratory: Negative.   Cardiovascular: Negative.   Gastrointestinal: Negative.   Endocrine: Negative.   Genitourinary: Positive for difficulty urinating.       Has cath  Skin: Negative.   Allergic/Immunologic: Negative.   Neurological: Positive for weakness.  Hematological: Negative.   Psychiatric/Behavioral: Positive for confusion and decreased concentration.       Objective:   Physical Exam  Constitutional: He is oriented to person, place, and time. He appears well-developed.  Obese  HENT:  Head: Normocephalic and atraumatic.  Eyes: Conjunctivae are normal. Pupils are equal, round, and reactive to light.  Neurological: He is alert and oriented to person, place, and time.  Psychiatric: He has a normal mood and affect.  Nursing note and vitals reviewed.  Visual fields are intact confrontation testing Speech with some word finding deficits appears to have good comprehension Motor  strength is 4/5 in the right deltoid, biceps, triceps, grip 5/5 in the left deltoid, biceps, triceps, grip as well as bilateral lower extremities Gait without evidence toe drag or knee instability. Sensation is described as equal bilateral upper extremities to light touch There is positive dysdiadochokinesis on rapid alternating supination palpation of the right upper ext       Assessment & Plan:  1. Left MCA infarct with right hemiparesis and aphasia, overall excellent improvement in functional status. Still has mild right upper extremity weakness with fine motor deficits as well as an expressive aphasia. Once his hematuria and urinary retention are addressed with TURP, patient would be appropriate for outpatient OT and speech therapy. His daughter will call us when this occurs.  He will follow up with urology Follow-up with neurology Follow-up with primary care physician Does not appear that GI follow-up will be necessary Physical medicine and rehabilitation follow-up on when necessary basis

## 2014-06-29 NOTE — Progress Notes (Signed)
STROKE NEUROLOGY FOLLOW UP NOTE  NAME: Henry [name] DOB: 21-Mar-1934  REASON FOR VISIT: stroke follow up HISTORY FROM: chart and pt and wife  Today we had the pleasure of seeing Henry [name] in follow-up at our Neurology Clinic. Pt was accompanied by wife.   History Summary Henry [name] is an 80 y.o. male hx of HTN, HLD presenting to [facility] with syncopal episode on 05/23/2014. Upon arrival found to be aphasic with right sided weakness. NIHSS = 15. Outside IV tPA window by time of arrival. A CTA showed left ICA proximal occlusion and the patient was transferred to [facility] for possible IR intervention. CT perfusion study showed large penumbra, he was taking to IR, and mechanical thrombectomy was performed. Left ICA and MCA recannulized, and left ICA stenting was performed. MRI showed left MCA territory infarct, and MRI showed wide patent left ICA and MCA. Carotid Doppler showed left ICA stent patent, and right ICA 50-60% stenosis. 2-D echo unremarkable. LDL 59 and A1c 6.2.  LV DVT negative. He was extubated on 05/25/2014 and his right hemiparesis improved nicely, however he still has severe dysarthria and aphasia. He passed swallow on discharge, was put on pured diet. Hwas put on ASA and Plavix for 3 months, and then Plavix monotherapy lifetime. He was discharged to CIR in good condition.  Interval History During the interval time, the patient has been doing well from a neurological standpoint.   he was discharged from [facility] on 06/04/2014. His speech much improved without dysarthria or aphasia. However he developed hematuria and the urinary retention in rehabilitation and foley catheter was placed and urology consult was requested. He continued to follow up with urology after discharge and went to ER twice for urinary retention, so far still with foley catheter and was found to have UTI. He is going to start Abx and will see urology next week for cystoscopy. He also had MRCP last week for  questionable pancreas lesion and was cleared by GI. For the procedures, he was currently only on ASA 81 and plavix on hold. Wife concerns that if he is on dural antiplatelet, he will have hematuria again. His BP was high today 188/88, but wife said at home he only at 140-160. Pt is somehow agitated by the foley catheter in the clinic today.  REVIEW OF SYSTEMS: Full 14 system review of systems performed and notable only for those listed below and in HPI above, all others are negative:  Constitutional:  weight loss, fatigue  Cardiovascular:  Ear/Nose/Throat:  Hearing loss Skin:  Eyes:   Respiratory:   Gastroitestinal:   Genitourinary:  urination problems  Hematology/Lymphatic:   anemia, easy bruising, easy bleeding  Endocrine:  Feeling cold Musculoskeletal:   Allergy/Immunology:   Neurological:   memory loss, confusion, weakness, slurry speech, dizziness  Psychiatric:  Sleep:  insomnia, snoring   The following represents the patient's updated allergies and side effects list: No Known Allergies  The neurologically relevant items on the patient's problem list were reviewed on today's visit.  Neurologic Examination  A problem focused neurological exam (12 or more points of the single system neurologic examination, vital signs counts as 1 point, cranial nerves count for 8 points) was performed.  Blood pressure 188/88, pulse 99, height 5\' 10"  (1.778 m), weight 223 lb 12.8 oz (101.515 kg).  General - Well nourished, well developed, in mild distress due to foley catheter.  Ophthalmologic - Fundi not visualized due to incorporation  Cardiovascular - Regular rate and  rhythm with no murmur.  Mental Status -  Level of arousal and orientation to time, place, and person were intact. Language including expression, naming, repetition, comprehension was assessed and found intact. No aphasia.  Cranial Nerves II - XII - II - Visual field intact OU. III, IV, VI - Extraocular movements intact. V  - Facial sensation intact bilaterally. VII - Facial movement intact bilaterally. VIII - Hearing & vestibular intact bilaterally. X - Palate elevates symmetrically. XI - Chin turning & shoulder shrug intact bilaterally. XII - Tongue protrusion intact.  Motor Strength - The patient's strength was normal in all extremities and pronator drift was absent.  Bulk was normal and fasciculations were absent.   Motor Tone - Muscle tone was assessed at the neck and appendages and was normal.  Reflexes - The patient's reflexes were 1+ in all extremities and he had no pathological reflexes.  Sensory - Light touch, temperature/pinprick were were assessed and were normal.    Coordination - The patient had normal movements in the hands and feet with no ataxia or dysmetria.  Tremor was absent.  Gait and Station - The patient's transfers, posture, gait, station, and turns were observed as normal.  Data reviewed: I personally reviewed the images and agree with the radiology interpretations.  Ct Angio Head W/cm &/or Wo Cm  05/23/2014 IMPRESSION: 1. Nonvisualization of the left internal carotid artery, occluded proximally within the neck. There is distal reconstitution at the supraclinoid segment of the left ICA via contralateral flow across the circle of Willis. Flow within the left M1 segment is present but somewhat thready and irregular in appearance, which may in part be related to motion artifact on this exam. There is question of a superimposed focal moderate to severe stenosis and/or partially occlusive thrombus within the distal left M1/ proximal M2 segment near the base of the sylvian fissure. CTA imaging of the neck is suggested to evaluate the extent of the left ICA occlusion. 2. Widely patent right internal carotid artery system. 3. Widely patent vertebrobasilar system. A small left posterior communicating artery is present.   Ct Head Wo Contrast  05/23/2014 IMPRESSION: Post angiography without  evidence of intracranial hemorrhage or intracranial enhancing lesion. Indistinctness left subinsular region, left basal ganglia and portions of the left corona radiata/left frontal lobe may represent result of acute infarct. Prominent small vessel disease type changes.   05/23/2014 IMPRESSION: 1. No acute intracranial abnormality or significant interval change. 2. Stable atrophy and diffuse white matter disease. This likely reflects the sequela of chronic microvascular ischemia.   Ct Cerebral Perfusion W/cm  05/23/2014 IMPRESSION: 1. Area of ischemic core infarct within the left MCA territory, most prevalent within the anterior left frontal lobe. 2. Adjacent areas of elevated time to peak with relatively preserved CBV in the left MCA territory. Finding suspicious for ischemic penumbra. Size of ischemic penumbra overall approximately 1/3 the size of affected area.   Dg Chest Port 1 View  05/25/2014 IMPRESSION: Endotracheal tube similar to comparison, terminating approximately 2.9 cm above the carina. Unchanged gastric tube. Low lung volumes, with airspace and interstitial opacities potentially reflecting atelectasis and/ or consolidation. Widened mediastinum, accentuated by the rotation of the patient. If there were concern for acute mediastinal or vascular abnormality, CT would be advised.   Dg Chest Port 1 View  05/23/2014 IMPRESSION: Central mild vascular congestion and mild perihilar increased bronchial markings. No convincing pulmonary edema. Mild basilar atelectasis without segmental infiltrate. Endotracheal tube in place with tip 3.3 cm above the  carina. NG tube in place.   Mri and Mra Head/brain Wo Cm  05/24/2014 IMPRESSION: MRI HEAD: Multi small areas of acute ischemia LEFT middle cerebral artery territory. Small vascular flow void, less likely micro hemorrhage LEFT posterior frontal lobe without lobar hematoma. Involutional changes. Moderate to severe white matter changes  suggest chronic small vessel ischemic disease. Findings suggests of normal pressure hydrocephalus. MRA HEAD: Recannulized LEFT internal carotid artery. Normal MRA of the head, complete circle of Willis.   Carotid Doppler Right: ECA stenosis. 40-59% internal carotid artery stenosis by velocities. But ICA/CCA ratio of 3.07 suggests stenosis in the 60-79% range. Left: <50% stenosis in the stent. Bilateral: Vertebral artery flow is antegrade.  2D Echocardiogram - Normal LV size with mild LV hypertrophy. EF 65-70%. Normal RV size and systolic function. No significant valvular abnormalities.  LE venous doppler - negative for DVT  Component     Latest Ref Rng 05/24/2014  Cholesterol     0 - 200 mg/dL 110  Triglycerides     <150 mg/dL 79  HDL Cholesterol     >39 mg/dL 35 (L)  Total CHOL/HDL Ratio      3.1  VLDL     0 - 40 mg/dL 16  LDL (calc)     0 - 99 mg/dL 59  Hemoglobin A1C     4.8 - 5.6 % 6.2 (H)  Mean Plasma Glucose      131    Assessment: As you may recall, he is a 80 y.o. Caucasian male with PMH of HTN, HLD was admitted on 05/23/14 for left MCA stroke due to left ICA and MCA tandem occlusion. IV tPA not given due to out of window but IR performed mechanical thrombectomy and achieved recannulization of left ICA and MCA. Left ICA stenting was done also. His language and right-sided hemiparesis recovered remarkably and the current examination showed no significant neuro deficit. He was put on aspirin and Plavix dural antiplatelet and discharged to CIR. However, during the interval time, he developed hematuria, urinary retention, UTI, and was put on foley catheter. He is still following up with urology to consider cystoscopy next week. He also had MRCP last week for questionable pancreas lesion but was cleared by GI. During the procedures, his Plavix was currently on hold, and he is on aspirin 81 on the right now. Will recommend resuming Plavix once procedure done and will repeat  carotid Doppler to rule out in-stent restenosis.  Plan:  - continue ASA and lipitor for stroke prevention. Once procedures are done, resume plavix and discontinue ASA to avoid recurrent hematuria which patient and the family are most concerned about.  - will check carotid doppler in 2-3 weeks. - Patient will discuss with PCP tomorrow regarding BP meds adjustment due to high BP today - Follow up with your primary care physician for stroke risk factor modification. Recommend maintain blood pressure goal <130/80, diabetes with hemoglobin A1c goal below 6.5% and lipids with LDL cholesterol goal below 70 mg/dL.  - follow up with urology for procedure. - RTC in 2 months  Orders Placed This Encounter  Procedures  . US Carotid Bilateral    Standing Status: Future     Number of Occurrences:      Standing Expiration Date: 08/31/2015    Scheduling Instructions:     Please schedule in 3 weeks. Thank you    Order Specific Question:  Reason for Exam (SYMPTOM  OR DIAGNOSIS REQUIRED)    Answer:  follow up with carotid  stent on the left    Order Specific Question:  Preferred imaging location?    Answer:  Internal    No orders of the defined types were placed in this encounter.    Patient Instructions  - continue ASA and lipitor for stroke prevention. Once you finish off with urology procedure next week, you need resume plavix and hold off ASA to avoid bleeding.  - will check carotid doppler in 2 weeks after resuming plavix - discuss with PCP tomorrow regarding BP meds adjustment due to high BP today - Follow up with your primary care physician for stroke risk factor modification. Recommend maintain blood pressure goal <130/80, diabetes with hemoglobin A1c goal below 6.5% and lipids with LDL cholesterol goal below 70 mg/dL.  - follow up with urology for procedure. - follow up in 2 months    Rosalin Hawking, MD PhD [facility] [address], [place], [place] [address] [phone]

## 2014-06-29 NOTE — Patient Instructions (Signed)
Please call when you have the prostate surgery. We can order outpatient OT and speech therapy at [facility].

## 2014-06-29 NOTE — Patient Instructions (Addendum)
-   continue ASA and lipitor for stroke prevention. Once you finish off with urology procedure next week, you need resume plavix and hold off ASA to avoid bleeding.  - will check carotid doppler in 2 weeks after resuming plavix - discuss with PCP tomorrow regarding BP meds adjustment due to high BP today - Follow up with your primary care physician for stroke risk factor modification. Recommend maintain blood pressure goal <130/80, diabetes with hemoglobin A1c goal below 6.5% and lipids with LDL cholesterol goal below 70 mg/dL.  - follow up with urology for procedure. - follow up in 2 months

## 2014-06-30 ENCOUNTER — Other Ambulatory Visit: Payer: Self-pay | Admitting: Urology

## 2014-06-30 DIAGNOSIS — I10 Essential (primary) hypertension: Secondary | ICD-10-CM | POA: Diagnosis not present

## 2014-06-30 DIAGNOSIS — R339 Retention of urine, unspecified: Secondary | ICD-10-CM | POA: Diagnosis not present

## 2014-06-30 DIAGNOSIS — N138 Other obstructive and reflux uropathy: Secondary | ICD-10-CM | POA: Diagnosis not present

## 2014-06-30 DIAGNOSIS — N390 Urinary tract infection, site not specified: Secondary | ICD-10-CM | POA: Diagnosis not present

## 2014-06-30 DIAGNOSIS — N401 Enlarged prostate with lower urinary tract symptoms: Secondary | ICD-10-CM | POA: Diagnosis not present

## 2014-06-30 DIAGNOSIS — I6789 Other cerebrovascular disease: Secondary | ICD-10-CM | POA: Diagnosis not present

## 2014-07-01 DIAGNOSIS — Z87891 Personal history of nicotine dependence: Secondary | ICD-10-CM | POA: Diagnosis not present

## 2014-07-01 DIAGNOSIS — I69322 Dysarthria following cerebral infarction: Secondary | ICD-10-CM | POA: Diagnosis not present

## 2014-07-01 DIAGNOSIS — N400 Enlarged prostate without lower urinary tract symptoms: Secondary | ICD-10-CM | POA: Diagnosis not present

## 2014-07-01 DIAGNOSIS — I10 Essential (primary) hypertension: Secondary | ICD-10-CM | POA: Diagnosis not present

## 2014-07-01 DIAGNOSIS — K219 Gastro-esophageal reflux disease without esophagitis: Secondary | ICD-10-CM | POA: Diagnosis not present

## 2014-07-01 DIAGNOSIS — I69320 Aphasia following cerebral infarction: Secondary | ICD-10-CM | POA: Diagnosis not present

## 2014-07-03 DIAGNOSIS — I10 Essential (primary) hypertension: Secondary | ICD-10-CM | POA: Diagnosis not present

## 2014-07-03 DIAGNOSIS — I69322 Dysarthria following cerebral infarction: Secondary | ICD-10-CM | POA: Diagnosis not present

## 2014-07-03 DIAGNOSIS — I69320 Aphasia following cerebral infarction: Secondary | ICD-10-CM | POA: Diagnosis not present

## 2014-07-03 DIAGNOSIS — N400 Enlarged prostate without lower urinary tract symptoms: Secondary | ICD-10-CM | POA: Diagnosis not present

## 2014-07-03 DIAGNOSIS — K219 Gastro-esophageal reflux disease without esophagitis: Secondary | ICD-10-CM | POA: Diagnosis not present

## 2014-07-03 DIAGNOSIS — Z87891 Personal history of nicotine dependence: Secondary | ICD-10-CM | POA: Diagnosis not present

## 2014-07-05 ENCOUNTER — Encounter (HOSPITAL_COMMUNITY): Payer: Self-pay | Admitting: Emergency Medicine

## 2014-07-05 ENCOUNTER — Inpatient Hospital Stay (HOSPITAL_COMMUNITY)
Admission: EM | Admit: 2014-07-05 | Discharge: 2014-07-07 | DRG: 699 | Disposition: A | Discharge status: Home Health | Payer: Medicare Other | Attending: Internal Medicine | Admitting: Internal Medicine

## 2014-07-05 ENCOUNTER — Inpatient Hospital Stay (HOSPITAL_COMMUNITY): Payer: Medicare Other

## 2014-07-05 DIAGNOSIS — Z7902 Long term (current) use of antithrombotics/antiplatelets: Secondary | ICD-10-CM | POA: Diagnosis not present

## 2014-07-05 DIAGNOSIS — N400 Enlarged prostate without lower urinary tract symptoms: Secondary | ICD-10-CM | POA: Diagnosis not present

## 2014-07-05 DIAGNOSIS — M7989 Other specified soft tissue disorders: Secondary | ICD-10-CM | POA: Diagnosis not present

## 2014-07-05 DIAGNOSIS — Z7982 Long term (current) use of aspirin: Secondary | ICD-10-CM

## 2014-07-05 DIAGNOSIS — E785 Hyperlipidemia, unspecified: Secondary | ICD-10-CM | POA: Diagnosis present

## 2014-07-05 DIAGNOSIS — T8389XA Other specified complication of genitourinary prosthetic devices, implants and grafts, initial encounter: Principal | ICD-10-CM | POA: Diagnosis present

## 2014-07-05 DIAGNOSIS — I10 Essential (primary) hypertension: Secondary | ICD-10-CM | POA: Diagnosis not present

## 2014-07-05 DIAGNOSIS — N320 Bladder-neck obstruction: Secondary | ICD-10-CM | POA: Diagnosis present

## 2014-07-05 DIAGNOSIS — I69320 Aphasia following cerebral infarction: Secondary | ICD-10-CM | POA: Diagnosis not present

## 2014-07-05 DIAGNOSIS — R339 Retention of urine, unspecified: Secondary | ICD-10-CM | POA: Diagnosis present

## 2014-07-05 DIAGNOSIS — I639 Cerebral infarction, unspecified: Secondary | ICD-10-CM | POA: Diagnosis not present

## 2014-07-05 DIAGNOSIS — Z79899 Other long term (current) drug therapy: Secondary | ICD-10-CM | POA: Diagnosis not present

## 2014-07-05 DIAGNOSIS — N138 Other obstructive and reflux uropathy: Secondary | ICD-10-CM | POA: Diagnosis present

## 2014-07-05 DIAGNOSIS — R319 Hematuria, unspecified: Secondary | ICD-10-CM | POA: Diagnosis present

## 2014-07-05 DIAGNOSIS — N390 Urinary tract infection, site not specified: Secondary | ICD-10-CM | POA: Diagnosis present

## 2014-07-05 DIAGNOSIS — R4701 Aphasia: Secondary | ICD-10-CM | POA: Diagnosis present

## 2014-07-05 DIAGNOSIS — Z87891 Personal history of nicotine dependence: Secondary | ICD-10-CM

## 2014-07-05 DIAGNOSIS — N401 Enlarged prostate with lower urinary tract symptoms: Secondary | ICD-10-CM | POA: Diagnosis present

## 2014-07-05 DIAGNOSIS — R531 Weakness: Secondary | ICD-10-CM | POA: Diagnosis not present

## 2014-07-05 DIAGNOSIS — K219 Gastro-esophageal reflux disease without esophagitis: Secondary | ICD-10-CM | POA: Diagnosis present

## 2014-07-05 DIAGNOSIS — M1990 Unspecified osteoarthritis, unspecified site: Secondary | ICD-10-CM | POA: Diagnosis present

## 2014-07-05 DIAGNOSIS — Y846 Urinary catheterization as the cause of abnormal reaction of the patient, or of later complication, without mention of misadventure at the time of the procedure: Secondary | ICD-10-CM | POA: Diagnosis present

## 2014-07-05 DIAGNOSIS — I63512 Cerebral infarction due to unspecified occlusion or stenosis of left middle cerebral artery: Secondary | ICD-10-CM | POA: Diagnosis present

## 2014-07-05 DIAGNOSIS — N179 Acute kidney failure, unspecified: Secondary | ICD-10-CM | POA: Diagnosis present

## 2014-07-05 DIAGNOSIS — H9190 Unspecified hearing loss, unspecified ear: Secondary | ICD-10-CM | POA: Diagnosis present

## 2014-07-05 DIAGNOSIS — E780 Pure hypercholesterolemia: Secondary | ICD-10-CM | POA: Diagnosis present

## 2014-07-05 HISTORY — DX: Unspecified sprain of unspecified foot, initial encounter: S93.609A

## 2014-07-05 HISTORY — DX: Essential (primary) hypertension: I10

## 2014-07-05 LAB — URINE MICROSCOPIC-ADD ON

## 2014-07-05 LAB — URINALYSIS, ROUTINE W REFLEX MICROSCOPIC
GLUCOSE, UA: NEGATIVE mg/dL
Ketones, ur: NEGATIVE mg/dL
Nitrite: POSITIVE — AB
Protein, ur: 100 mg/dL — AB
Specific Gravity, Urine: 1.013 (ref 1.005–1.030)
Urobilinogen, UA: 1 mg/dL (ref 0.0–1.0)
pH: 5.5 (ref 5.0–8.0)

## 2014-07-05 LAB — COMPREHENSIVE METABOLIC PANEL
ALT: 28 U/L (ref 17–63)
ANION GAP: 14 (ref 5–15)
AST: 27 U/L (ref 15–41)
Albumin: 3.4 g/dL — ABNORMAL LOW (ref 3.5–5.0)
Alkaline Phosphatase: 52 U/L (ref 38–126)
BUN: 55 mg/dL — ABNORMAL HIGH (ref 6–20)
CO2: 18 mmol/L — ABNORMAL LOW (ref 22–32)
Calcium: 8.7 mg/dL — ABNORMAL LOW (ref 8.9–10.3)
Chloride: 103 mmol/L (ref 101–111)
Creatinine, Ser: 6.08 mg/dL — ABNORMAL HIGH (ref 0.61–1.24)
GFR calc non Af Amer: 8 mL/min — ABNORMAL LOW (ref >60)
GFR, EST AFRICAN AMERICAN: 9 mL/min — AB (ref >60)
GLUCOSE: 138 mg/dL — AB (ref 65–99)
POTASSIUM: 4.3 mmol/L (ref 3.5–5.1)
Sodium: 135 mmol/L (ref 135–145)
Total Bilirubin: 0.5 mg/dL (ref 0.3–1.2)
Total Protein: 6.6 g/dL (ref 6.5–8.1)

## 2014-07-05 LAB — CBC WITH DIFFERENTIAL/PLATELET
BASOS ABS: 0 10*3/uL (ref 0.0–0.1)
Basophils Relative: 0 % (ref 0–1)
Eosinophils Absolute: 0 10*3/uL (ref 0.0–0.7)
Eosinophils Relative: 0 % (ref 0–5)
HCT: 30.1 % — ABNORMAL LOW (ref 39.0–52.0)
HEMOGLOBIN: 9.6 g/dL — AB (ref 13.0–17.0)
LYMPHS PCT: 8 % — AB (ref 12–46)
Lymphs Abs: 0.9 10*3/uL (ref 0.7–4.0)
MCH: 26.8 pg (ref 26.0–34.0)
MCHC: 31.9 g/dL (ref 30.0–36.0)
MCV: 84.1 fL (ref 78.0–100.0)
Monocytes Absolute: 0.9 10*3/uL (ref 0.1–1.0)
Monocytes Relative: 8 % (ref 3–12)
NEUTROS PCT: 84 % — AB (ref 43–77)
Neutro Abs: 9.2 10*3/uL — ABNORMAL HIGH (ref 1.7–7.7)
Platelets: 362 10*3/uL (ref 150–400)
RBC: 3.58 MIL/uL — AB (ref 4.22–5.81)
RDW: 14.3 % (ref 11.5–15.5)
WBC: 11 10*3/uL — ABNORMAL HIGH (ref 4.0–10.5)

## 2014-07-05 LAB — I-STAT CG4 LACTIC ACID, ED: LACTIC ACID, VENOUS: 1.23 mmol/L (ref 0.5–2.0)

## 2014-07-05 MED ORDER — DEXTROSE 5 % IV SOLN
1.0000 g | Freq: Once | INTRAVENOUS | Status: AC
  Administered 2014-07-05: 1 g via INTRAVENOUS
  Filled 2014-07-05: qty 10

## 2014-07-05 MED ORDER — AMLODIPINE BESYLATE 5 MG PO TABS
5.0000 mg | ORAL_TABLET | Freq: Every day | ORAL | Status: DC
  Administered 2014-07-05 – 2014-07-06 (×2): 5 mg via ORAL
  Filled 2014-07-05 (×3): qty 1

## 2014-07-05 MED ORDER — ALPRAZOLAM 0.5 MG PO TABS
0.5000 mg | ORAL_TABLET | Freq: Three times a day (TID) | ORAL | Status: DC | PRN
  Administered 2014-07-06: 0.5 mg via ORAL
  Filled 2014-07-05: qty 1

## 2014-07-05 MED ORDER — ONDANSETRON HCL 4 MG/2ML IJ SOLN: 4.0000 mg | Freq: Four times a day (QID) | INTRAMUSCULAR | Status: DC | PRN

## 2014-07-05 MED ORDER — LIDOCAINE HCL 2 % EX GEL
1.0000 "application " | Freq: Once | CUTANEOUS | Status: AC
  Administered 2014-07-05: 1 via URETHRAL
  Filled 2014-07-05: qty 10

## 2014-07-05 MED ORDER — ONDANSETRON HCL 4 MG PO TABS: 4.0000 mg | ORAL_TABLET | Freq: Four times a day (QID) | ORAL | Status: DC | PRN

## 2014-07-05 MED ORDER — SODIUM CHLORIDE 0.9 % IV SOLN
INTRAVENOUS | Status: DC
  Administered 2014-07-06 – 2014-07-07 (×3): via INTRAVENOUS

## 2014-07-05 MED ORDER — ACETAMINOPHEN 325 MG PO TABS
650.0000 mg | ORAL_TABLET | Freq: Four times a day (QID) | ORAL | Status: DC | PRN
  Administered 2014-07-05: 650 mg via ORAL
  Filled 2014-07-05: qty 2

## 2014-07-05 MED ORDER — CEFTRIAXONE SODIUM IN DEXTROSE 20 MG/ML IV SOLN
1.0000 g | INTRAVENOUS | Status: DC
Start: 2014-07-06 — End: 2014-07-07
  Administered 2014-07-06: 1 g via INTRAVENOUS
  Filled 2014-07-05 (×2): qty 50

## 2014-07-05 MED ORDER — PANTOPRAZOLE SODIUM 40 MG PO TBEC
40.0000 mg | DELAYED_RELEASE_TABLET | Freq: Every day | ORAL | Status: DC
  Administered 2014-07-06 – 2014-07-07 (×2): 40 mg via ORAL
  Filled 2014-07-05 (×2): qty 1

## 2014-07-05 MED ORDER — ACETAMINOPHEN 650 MG RE SUPP: 650.0000 mg | Freq: Four times a day (QID) | RECTAL | Status: DC | PRN

## 2014-07-05 NOTE — Plan of Care (Signed)
Problem: Phase I Progression Outcomes Goal: Voiding-avoid urinary catheter unless indicated Outcome: Not Applicable Date Met:  92/44/62 Patient was admitted with foley and will be discharged with foley until outpatient follow up. See notes.

## 2014-07-05 NOTE — ED Notes (Signed)
Tech Anna Genre mistakenly clicked blood collect under RN Enid Cutter

## 2014-07-05 NOTE — ED Notes (Signed)
Per pt/wife-states has bladder infection form cath-placed on Bactrim-states he has been getting weak, not eating

## 2014-07-05 NOTE — ED Provider Notes (Signed)
CSN: [id number]     Arrival date & time 07/05/14  1119 History   First MD Initiated Contact with Patient 07/05/14 1138     Chief Complaint  Patient presents with  . bladder infection from cath      (Consider location/radiation/quality/duration/timing/severity/associated sxs/prior Treatment) Patient is a 80 y.o. male presenting with general illness. The history is provided by the patient and the spouse.  Illness Location:  Generalized Severity:  Moderate Onset quality:  Gradual Duration:  1 week Timing:  Constant Progression:  Worsening Chronicity:  New Context:  On last day of treatment fro UTI (bactrim) Relieved by:  Nothing Worsened by:  Nothing Ineffective treatments:  Bactrim Associated symptoms: abdominal pain, diarrhea, fatigue and nausea   Associated symptoms: no chest pain, no congestion, no cough, no fever, no headaches, no rash, no rhinorrhea, no shortness of breath and no vomiting     Past Medical History  Diagnosis Date  . Hypertension   . GERD (gastroesophageal reflux disease)   . Hypercholesteremia   . HOH (hard of hearing)   . Stroke     had a stroke 05/23/2014  . Arthritis   . Hx-TIA (transient ischemic attack)     hx of this- wife states 80 years ago-but Dr. Estil Daft told wife he had had several-has 2 stents in carotid artery-left   Past Surgical History  Procedure Laterality Date  . Shoulder surgery Right     torn cartiledge  . Esophagogastroduodenoscopy  10/28/2010    Procedure: ESOPHAGOGASTRODUODENOSCOPY (EGD);  Surgeon: [name], MD;  [facility];  Service: Endoscopy;  Laterality: N/A;  10:30  . Colonoscopy    . Colonoscopy  05/17/2011    Procedure: COLONOSCOPY;  Surgeon: [name], MD;  [facility];  Service: Endoscopy;  Laterality: N/A;  830  . Resection distal clavical Right 12/13/2012    Procedure: RESECTION DISTAL CLAVICAL;  Surgeon: Carole Civil, MD;  Location: [facility];  Service: Orthopedics;   Laterality: Right;  . Shoulder arthroscopy with bicepstenotomy Right 12/13/2012    Procedure: SHOULDER ARTHROSCOPY WITH BICEPS TENOTOMY, limited shoulder debridement;  Surgeon: Carole Civil, MD;  Location: [facility];  Service: Orthopedics;  Laterality: Right;  . Esophagogastroduodenoscopy N/A 04/24/2014    Procedure: ESOPHAGOGASTRODUODENOSCOPY (EGD);  Surgeon: [name], MD;  [facility];  Service: Endoscopy;  Laterality: N/A;  815  . Radiology with anesthesia N/A 05/23/2014    Procedure: RADIOLOGY WITH ANESTHESIA;  Surgeon: Medication Radiologist, MD;  Location: [place];  Service: Radiology;  Laterality: N/A;  . Epidural steroid injection  05/22/2014    had this in lumbar and at 1 am on 05/23/2014 had a stroke  . Carotid stents  05/2014    2 stents in carotid artery on left side  . Shoulder surgery      right shoulder  . Eus N/A 06/26/2014    Procedure: FULL UPPER ENDOSCOPIC ULTRASOUND (EUS) RADIAL;  Surgeon: [name], MD;  Location: [facility];  Service: Endoscopy;  Laterality: N/A;  . Esophagogastroduodenoscopy N/A 06/26/2014    Procedure: ESOPHAGOGASTRODUODENOSCOPY (EGD);  Surgeon: [name], MD;  Location: [facility];  Service: Endoscopy;  Laterality: N/A;   Family History  Problem Relation Age of Onset  . Colon cancer Neg Hx    History  Substance Use Topics  . Smoking status: Former Smoker -- 0.50 packs/day for 35 years    Types: Cigarettes    Quit date: 12/10/1994  . Smokeless tobacco: Not on file  .  Alcohol Use: 1.8 oz/week    3 Glasses of wine per week     Comment: occassionally    Review of Systems  Constitutional: Positive for fatigue. Negative for fever, activity change and appetite change.  HENT: Negative for congestion, facial swelling, rhinorrhea and trouble swallowing.   Eyes: Negative for photophobia and pain.  Respiratory: Negative for cough, chest tightness and shortness of breath.   Cardiovascular: Negative for chest pain and leg  swelling.  Gastrointestinal: Positive for nausea, abdominal pain and diarrhea. Negative for vomiting and constipation.  Endocrine: Negative for polydipsia and polyuria.  Genitourinary: Negative for dysuria, urgency, decreased urine volume and difficulty urinating.  Musculoskeletal: Negative for back pain and gait problem.  Skin: Negative for color change, rash and wound.  Allergic/Immunologic: Negative for immunocompromised state.  Neurological: Negative for dizziness, facial asymmetry, speech difficulty, weakness, numbness and headaches.  Psychiatric/Behavioral: Negative for confusion, decreased concentration and agitation.      Allergies  Review of patient's allergies indicates no known allergies.  Home Medications   Prior to Admission medications   Medication Sig Start Date End Date Taking? Authorizing Provider  ALPRAZolam Duanne Moron) 0.5 MG tablet Take 1 tablet (0.5 mg total) by mouth 3 (three) times daily as needed for anxiety. 06/05/14  Yes Daniel J Angiulli, PA-C  amLODipine (NORVASC) 5 MG tablet Take 1 tablet (5 mg total) by mouth daily. Patient taking differently: Take 5 mg by mouth at bedtime.  06/05/14  Yes Daniel J Angiulli, PA-C  aspirin EC 81 MG EC tablet Take 1 tablet (81 mg total) by mouth daily. Patient taking differently: Take 81 mg by mouth at bedtime.  06/05/14  Yes Daniel J Angiulli, PA-C  atorvastatin (LIPITOR) 40 MG tablet Take 1 tablet (40 mg total) by mouth every evening. 06/05/14  Yes Daniel J Angiulli, PA-C  diphenhydramine-acetaminophen (TYLENOL PM) 25-500 MG TABS Take 2 tablets by mouth daily as needed (pain).   Yes Historical Provider, MD  esomeprazole (NEXIUM) 40 MG capsule Take 40 mg by mouth every morning.    Yes Historical Provider, MD  finasteride (PROSCAR) 5 MG tablet Take 1 tablet (5 mg total) by mouth daily. 06/03/14  Yes [name], MD  neomycin-bacitracin-polymyxin (NEOSPORIN) ointment Apply 1 application topically daily. Catheter insertion   Yes Historical  Provider, MD  sulfamethoxazole-trimethoprim (BACTRIM DS,SEPTRA DS) 800-160 MG per tablet Take 1 tablet by mouth 2 (two) times daily. Started 06-29-14 for 7 day course 06/29/14 07/06/14 Yes Historical Provider, MD  clopidogrel (PLAVIX) 75 MG tablet Take 1 tablet (75 mg total) by mouth daily. Patient not taking: Reported on 06/29/2014 06/05/14   Lavon Paganini Angiulli, PA-C  doxycycline (VIBRAMYCIN) 100 MG capsule Take 1 capsule (100 mg total) by mouth 2 (two) times daily. One po bid x 7 days Patient not taking: Reported on 07/05/2014 06/16/14   April Palumbo, MD  silodosin (RAPAFLO) 8 MG CAPS capsule Take 8 mg by mouth daily with breakfast.    Historical Provider, MD   BP 138/62 mmHg  Pulse 91  Temp(Src) 98.2 F (36.8 C) (Oral)  Resp 20  SpO2 96% Physical Exam  Constitutional: He is oriented to person, place, and time. He appears well-developed and well-nourished. No distress.  HENT:  Head: Normocephalic and atraumatic.  Mouth/Throat: No oropharyngeal exudate.  Eyes: Pupils are equal, round, and reactive to light.  Neck: Normal range of motion. Neck supple.  Cardiovascular: Normal rate, regular rhythm and normal heart sounds.  Exam reveals no gallop and no friction rub.   No  murmur heard. Pulmonary/Chest: Effort normal and breath sounds normal. No respiratory distress. He has no wheezes. He has no rales.  Abdominal: Soft. Bowel sounds are normal. He exhibits no distension and no mass. There is tenderness in the suprapubic area. There is no rebound and no guarding.    Musculoskeletal: Normal range of motion. He exhibits no edema or tenderness.  Neurological: He is alert and oriented to person, place, and time.  Skin: Skin is warm and dry.  Psychiatric: He has a normal mood and affect.    ED Course  Procedures (including critical care time) Labs Review Labs Reviewed  CBC WITH DIFFERENTIAL/PLATELET - Abnormal; Notable for the following:    WBC 11.0 (*)    RBC 3.58 (*)    Hemoglobin 9.6 (*)     HCT 30.1 (*)    Neutrophils Relative % 84 (*)    Neutro Abs 9.2 (*)    Lymphocytes Relative 8 (*)    All other components within normal limits  COMPREHENSIVE METABOLIC PANEL - Abnormal; Notable for the following:    CO2 18 (*)    Glucose, Bld 138 (*)    BUN 55 (*)    Creatinine, Ser 6.08 (*)    Calcium 8.7 (*)    Albumin 3.4 (*)    GFR calc non Af Amer 8 (*)    GFR calc Af Amer 9 (*)    All other components within normal limits  URINALYSIS, ROUTINE W REFLEX MICROSCOPIC - Abnormal; Notable for the following:    Color, Urine ORANGE (*)    APPearance TURBID (*)    Hgb urine dipstick LARGE (*)    Bilirubin Urine SMALL (*)    Protein, ur 100 (*)    Nitrite POSITIVE (*)    Leukocytes, UA LARGE (*)    All other components within normal limits  URINE MICROSCOPIC-ADD ON - Abnormal; Notable for the following:    Bacteria, UA MANY (*)    All other components within normal limits  URINE CULTURE  I-STAT CG4 LACTIC ACID, ED    Imaging Review No results found.   EKG Interpretation None      MDM   Final diagnoses:  Acute renal failure, unspecified acute renal failure type  UTI (lower urinary tract infection)  Urinary retention    Pt is a 80 y.o. male with Pmhx as above who presents with fatigue, anorexia and low abd pain. On PE, VSS, pt in NAD. Bladder very distended. Pt on last day of bactrim for reported UTI. Will get labs, flush catheter, if no urine returned will change out.   Catheter replaced with about 3L urine out, feeling mildly improved. Pt has ARF from 0.8 to 6, with continued UTI. IV rocephin given. Spoke w/ [facility] who will admit, have asked to urology to be made aware.   3:03 PM Spoke to [employer] resident on call, Dr. Curt Bears who will follow.     Ernestina Patches, MD 07/05/14 [phone]

## 2014-07-05 NOTE — ED Notes (Signed)
MD working with patient.  I will return to collect labs.

## 2014-07-05 NOTE — H&P (Addendum)
PCP:   Lanette Hampshire, MD   Chief Complaint:  Weakness and fatigue  HPI:  80 year old male who  has a past medical history of Hypertension; GERD (gastroesophageal reflux disease); Hypercholesteremia; HOH (hard of hearing); Stroke; Arthritis; and TIA (transient ischemic attack). today presents to the hospital with chief complaint of generalized weakness and fatigue chills going on for past 1 week. Patient initially presented on April 9 for syncopal episode and aphasia, patient was found to have CVA and also had occluded left ICA which required stenting assisted angioplasty by IR. Patient was then transferred to inpatient rehabilitation,  With patient developed hematuria was found to have BPH urology was consulted and Foley catheter was placed. At that time CT abdomen pelvis showed no definite source of hematuria. Patient was found to have BPH. Patient was seen in the ED on 06/17/2014 with urinary retention, at that time fully catheter was changed. He was again seen by urology last week and started on Bactrim for UTI.  Over the past few days patient has been feeling generally weak, with chills and had poor by mouth intake. Patient was making urine in the Foley bag though the quantity of urine was going down slowly day by day as per wife. In the ED patient found to have acute kidney injury with creatinine 6.08 with B and 55. Baseline creatinine as of 06/17/2014 was 0.80. Bladder scan showed urinary retention, Foley catheter was replaced and patient had 1100 mL urine after the Foley was replaced. Patient denies fever, no nausea vomiting or diarrhea. No chest pain or shortness of breath. Denies any fever. Complains of right lower quadrant abdominal pain which has now resolved after Foley was placed. Plavix is on hold due to hematuria. Allergies:  No Known Allergies    Past Medical History  Diagnosis Date  . Hypertension   . GERD (gastroesophageal reflux disease)   . Hypercholesteremia   . HOH  (hard of hearing)   . Stroke     had a stroke 05/23/2014  . Arthritis   . Hx-TIA (transient ischemic attack)     hx of this- wife states 72 years ago-but Dr. Estil Daft told wife he had had several-has 2 stents in carotid artery-left    Past Surgical History  Procedure Laterality Date  . Shoulder surgery Right     torn cartiledge  . Esophagogastroduodenoscopy  10/28/2010    Procedure: ESOPHAGOGASTRODUODENOSCOPY (EGD);  Surgeon: [name], MD;  [facility];  Service: Endoscopy;  Laterality: N/A;  10:30  . Colonoscopy    . Colonoscopy  05/17/2011    Procedure: COLONOSCOPY;  Surgeon: [name], MD;  [facility];  Service: Endoscopy;  Laterality: N/A;  830  . Resection distal clavical Right 12/13/2012    Procedure: RESECTION DISTAL CLAVICAL;  Surgeon: Carole Civil, MD;  Location: [facility];  Service: Orthopedics;  Laterality: Right;  . Shoulder arthroscopy with bicepstenotomy Right 12/13/2012    Procedure: SHOULDER ARTHROSCOPY WITH BICEPS TENOTOMY, limited shoulder debridement;  Surgeon: Carole Civil, MD;  Location: [facility];  Service: Orthopedics;  Laterality: Right;  . Esophagogastroduodenoscopy N/A 04/24/2014    Procedure: ESOPHAGOGASTRODUODENOSCOPY (EGD);  Surgeon: [name], MD;  [facility];  Service: Endoscopy;  Laterality: N/A;  815  . Radiology with anesthesia N/A 05/23/2014    Procedure: RADIOLOGY WITH ANESTHESIA;  Surgeon: Medication Radiologist, MD;  Location: [place];  Service: Radiology;  Laterality: N/A;  . Epidural steroid injection  05/22/2014  had this in lumbar and at 1 am on 05/23/2014 had a stroke  . Carotid stents  05/2014    2 stents in carotid artery on left side  . Shoulder surgery      right shoulder  . Eus N/A 06/26/2014    Procedure: FULL UPPER ENDOSCOPIC ULTRASOUND (EUS) RADIAL;  Surgeon: [name], MD;  Location: [facility];  Service: Endoscopy;  Laterality: N/A;  . Esophagogastroduodenoscopy N/A  06/26/2014    Procedure: ESOPHAGOGASTRODUODENOSCOPY (EGD);  Surgeon: [name], MD;  Location: [facility];  Service: Endoscopy;  Laterality: N/A;    Prior to Admission medications   Medication Sig Start Date End Date Taking? Authorizing Provider  ALPRAZolam Duanne Moron) 0.5 MG tablet Take 1 tablet (0.5 mg total) by mouth 3 (three) times daily as needed for anxiety. 06/05/14  Yes Daniel J Angiulli, PA-C  amLODipine (NORVASC) 5 MG tablet Take 1 tablet (5 mg total) by mouth daily. Patient taking differently: Take 5 mg by mouth at bedtime.  06/05/14  Yes Daniel J Angiulli, PA-C  aspirin EC 81 MG EC tablet Take 1 tablet (81 mg total) by mouth daily. Patient taking differently: Take 81 mg by mouth at bedtime.  06/05/14  Yes Daniel J Angiulli, PA-C  atorvastatin (LIPITOR) 40 MG tablet Take 1 tablet (40 mg total) by mouth every evening. 06/05/14  Yes Daniel J Angiulli, PA-C  diphenhydramine-acetaminophen (TYLENOL PM) 25-500 MG TABS Take 2 tablets by mouth daily as needed (pain).   Yes Historical Provider, MD  esomeprazole (NEXIUM) 40 MG capsule Take 40 mg by mouth every morning.    Yes Historical Provider, MD  finasteride (PROSCAR) 5 MG tablet Take 1 tablet (5 mg total) by mouth daily. 06/03/14  Yes [name], MD  neomycin-bacitracin-polymyxin (NEOSPORIN) ointment Apply 1 application topically daily. Catheter insertion   Yes Historical Provider, MD  sulfamethoxazole-trimethoprim (BACTRIM DS,SEPTRA DS) 800-160 MG per tablet Take 1 tablet by mouth 2 (two) times daily. Started 06-29-14 for 7 day course 06/29/14 07/06/14 Yes Historical Provider, MD  clopidogrel (PLAVIX) 75 MG tablet Take 1 tablet (75 mg total) by mouth daily. Patient not taking: Reported on 06/29/2014 06/05/14   Lavon Paganini Angiulli, PA-C  doxycycline (VIBRAMYCIN) 100 MG capsule Take 1 capsule (100 mg total) by mouth 2 (two) times daily. One po bid x 7 days Patient not taking: Reported on 07/05/2014 06/16/14   April Palumbo, MD  silodosin (RAPAFLO) 8 MG  CAPS capsule Take 8 mg by mouth daily with breakfast.    Historical Provider, MD    Social History:  reports that he quit smoking about 19 years ago. His smoking use included Cigarettes. He has a 17.5 pack-year smoking history. He does not have any smokeless tobacco history on file. He reports that he drinks about 1.8 oz of alcohol per week. He reports that he does not use illicit drugs.  Family History  Problem Relation Age of Onset  . Colon cancer Neg Hx      All the positives are listed in BOLD  Review of Systems:  HEENT: Headache, blurred vision, runny nose, sore throat Neck: Hypothyroidism, hyperthyroidism,,lymphadenopathy Chest : Shortness of breath, history of COPD, Asthma Heart : Chest pain, history of coronary arterey disease GI:  Nausea, vomiting, diarrhea, constipation, GERD GU: Dysuria, urgency, frequency of urination, hematuria Neuro: Stroke, seizures, syncope Psych: Depression, anxiety, hallucinations   Physical Exam: Blood pressure 138/62, pulse 91, temperature 98.2 F (36.8 C), temperature source Oral, resp. rate 20, SpO2 96 %. Constitutional:   Patient is  a well-developed and well-nourished *male in no acute distress and cooperative with exam. Head: Normocephalic and atraumatic Mouth: Mucus membranes moist Eyes: PERRL, EOMI, conjunctivae normal Neck: Supple, No Thyromegaly Cardiovascular: RRR, S1 normal, S2 normal Pulmonary/Chest: CTAB, no wheezes, rales, or rhonchi Abdominal: Soft. Non-tender, non-distended, bowel sounds are normal, no masses, organomegaly, or guarding present.  Neurological: A&O x3, Strength is normal and symmetric bilaterally, cranial nerve II-XII are grossly intact, no focal motor deficit, sensory intact to light touch bilaterally.  Extremities : No Cyanosis, Clubbing , 2+ pitting edema of the right lower extremity  Labs on Admission:  Basic Metabolic Panel:  Recent Labs Lab 07/05/14 1210  NA 135  K 4.3  CL 103  CO2 18*  GLUCOSE  138*  BUN 55*  CREATININE 6.08*  CALCIUM 8.7*   Liver Function Tests:  Recent Labs Lab 07/05/14 1210  AST 27  ALT 28  ALKPHOS 52  BILITOT 0.5  PROT 6.6  ALBUMIN 3.4*   No results for input(s): LIPASE, AMYLASE in the last 168 hours. No results for input(s): AMMONIA in the last 168 hours. CBC:  Recent Labs Lab 07/05/14 1210  WBC 11.0*  NEUTROABS 9.2*  HGB 9.6*  HCT 30.1*  MCV 84.1  PLT 362       Assessment/Plan Active Problems:   ARF (acute renal failure)   AKI (acute kidney injury)  Acute kidney injury Likely postobstructive uropathy as 1100 mL urine was obtained after replacing the Foley catheter. Will obtain renal ultrasound, I called and discussed with nephrologist on call Dr. Jonnie Finner, who says that renal function should improve by tomorrow. If no improvement consider calling nephrology consult in a.m.  UTI Patient also has abnormal UA, he was prescribed Bactrim which could have also resulted in renal injury. At this time will discontinue Bactrim and start Rocephin. Follow urine culture results.  BPH/hematuria Urology aware of patient's hematuria, Patient to follow-up with urology as outpatient, continue Foley catheter.  Recent CVA No deficits, patient able to ambulating can indicate. Plavix is currently on hold due to hematuria.  Right lower extremity swelling check venous Doppler of lower extremity to rule out DVT  Code status: Full code  Family discussion: Admission, patients condition and plan of care including tests being ordered have been discussed with the patient and *his wife at bedside* who indicate understanding and agree with the plan and Code Status.   Time Spent on Admission: 60 minutes  [facility] Pager: [phone] 07/05/2014, 3:15 PM  If 7PM-7AM, please contact night-coverage  [email or web address]  Password [email or web address]

## 2014-07-06 ENCOUNTER — Encounter (HOSPITAL_COMMUNITY): Payer: Self-pay | Admitting: Internal Medicine

## 2014-07-06 ENCOUNTER — Inpatient Hospital Stay (HOSPITAL_COMMUNITY): Payer: Medicare Other

## 2014-07-06 DIAGNOSIS — M7989 Other specified soft tissue disorders: Secondary | ICD-10-CM

## 2014-07-06 DIAGNOSIS — I639 Cerebral infarction, unspecified: Secondary | ICD-10-CM

## 2014-07-06 DIAGNOSIS — N179 Acute kidney failure, unspecified: Secondary | ICD-10-CM

## 2014-07-06 DIAGNOSIS — I63512 Cerebral infarction due to unspecified occlusion or stenosis of left middle cerebral artery: Secondary | ICD-10-CM

## 2014-07-06 DIAGNOSIS — R4701 Aphasia: Secondary | ICD-10-CM

## 2014-07-06 DIAGNOSIS — I10 Essential (primary) hypertension: Secondary | ICD-10-CM

## 2014-07-06 DIAGNOSIS — E785 Hyperlipidemia, unspecified: Secondary | ICD-10-CM

## 2014-07-06 DIAGNOSIS — N390 Urinary tract infection, site not specified: Secondary | ICD-10-CM | POA: Diagnosis present

## 2014-07-06 DIAGNOSIS — R319 Hematuria, unspecified: Secondary | ICD-10-CM

## 2014-07-06 LAB — COMPREHENSIVE METABOLIC PANEL
ALBUMIN: 2.9 g/dL — AB (ref 3.5–5.0)
ALT: 27 U/L (ref 17–63)
AST: 24 U/L (ref 15–41)
Alkaline Phosphatase: 49 U/L (ref 38–126)
Anion gap: 7 (ref 5–15)
BILIRUBIN TOTAL: 0.5 mg/dL (ref 0.3–1.2)
BUN: 31 mg/dL — AB (ref 6–20)
CO2: 23 mmol/L (ref 22–32)
CREATININE: 2.35 mg/dL — AB (ref 0.61–1.24)
Calcium: 8.5 mg/dL — ABNORMAL LOW (ref 8.9–10.3)
Chloride: 110 mmol/L (ref 101–111)
GFR calc non Af Amer: 25 mL/min — ABNORMAL LOW (ref >60)
GFR, EST AFRICAN AMERICAN: 29 mL/min — AB (ref >60)
Glucose, Bld: 105 mg/dL — ABNORMAL HIGH (ref 65–99)
Potassium: 4.1 mmol/L (ref 3.5–5.1)
Sodium: 140 mmol/L (ref 135–145)
Total Protein: 5.8 g/dL — ABNORMAL LOW (ref 6.5–8.1)

## 2014-07-06 LAB — CBC
HEMATOCRIT: 27.4 % — AB (ref 39.0–52.0)
HEMOGLOBIN: 8.8 g/dL — AB (ref 13.0–17.0)
MCH: 27.3 pg (ref 26.0–34.0)
MCHC: 32.1 g/dL (ref 30.0–36.0)
MCV: 85.1 fL (ref 78.0–100.0)
Platelets: 291 10*3/uL (ref 150–400)
RBC: 3.22 MIL/uL — ABNORMAL LOW (ref 4.22–5.81)
RDW: 14.3 % (ref 11.5–15.5)
WBC: 6.6 10*3/uL (ref 4.0–10.5)

## 2014-07-06 LAB — URINE CULTURE: Colony Count: 45000

## 2014-07-06 MED ORDER — ATORVASTATIN CALCIUM 40 MG PO TABS
40.0000 mg | ORAL_TABLET | Freq: Every evening | ORAL | Status: DC
  Administered 2014-07-06: 40 mg via ORAL
  Filled 2014-07-06 (×2): qty 1

## 2014-07-06 MED ORDER — FINASTERIDE 5 MG PO TABS
5.0000 mg | ORAL_TABLET | Freq: Every day | ORAL | Status: DC
  Administered 2014-07-06 – 2014-07-07 (×2): 5 mg via ORAL
  Filled 2014-07-06 (×2): qty 1

## 2014-07-06 MED ORDER — TAMSULOSIN HCL 0.4 MG PO CAPS
0.4000 mg | ORAL_CAPSULE | Freq: Every day | ORAL | Status: DC
  Administered 2014-07-06 – 2014-07-07 (×2): 0.4 mg via ORAL
  Filled 2014-07-06 (×2): qty 1

## 2014-07-06 NOTE — H&P (Signed)
  Active Problems Problems  1. Benign prostatic hyperplasia with urinary obstruction (N40.1,N13.8) 2. Clot hematuria (R31.0) 3. Urinary retention (R33.9)  History of Present Illness Henry [name] returns today in f/u. He has a 146ml prostate with recurrent gross hematuria on plavix despite the finasteride.  He is off of the plavix now and the urine has cleared but he had been having heavy bleeding and his Hgb fell from 14 to 9.4 over about 4 days.  He has some bladder spasms.   Past Medical History Problems  1. History of arthritis (Z87.39) 2. History of esophageal reflux (Z87.19) 3. History of hyperlipidemia (Z86.39) 4. History of hypertension (Z86.79) 5. History of stroke (E26.83)  Surgical History Problems  1. History of Shoulder Surgery Right  Current Meds 1. Aspirin 81 MG TABS;  Therapy: (Recorded:02May2016) to Recorded 2. Finasteride 5 MG Oral Tablet; TAKE 1 TABLET DAILY AS DIRECTED;  Therapy: [id number] to (Evaluate:01Dec2016)  Requested for: [id number]; Last  [id number] Ordered 3. Lipitor 40 MG Oral Tablet;  Therapy: (Recorded:02May2016) to Recorded 4. NexIUM 40 MG Oral Packet;  Therapy: (Recorded:02May2016) to Recorded 5. Plavix 75 MG Oral Tablet;  Therapy: (Recorded:02May2016) to Recorded 6. Rapaflo 8 MG Oral Capsule; TAKE 1 CAPSULE Daily;  Therapy: [id number] to (Evaluate:16May2016); Last [id number] Ordered  Allergies Medication  1. Tamsulosin HCl CAPS  Family History Problems  1. No pertinent family history : Mother  Social History Problems  1. Alcohol use (Z78.9)   ocassionally 2. Caffeine use (F15.90)   1 cup per day 3. Death in the family, father   age 69 4. Death in the family, mother   age 22 5. Former smoker [phone]) 6. Married 7. Number of children   2 boys & 2 girls 8. Occupation   Retired  Review of Systems  Genitourinary: suprapubic pain (with spasm. ).  Gastrointestinal: no abdominal pain.  Constitutional: no fever.   Cardiovascular: no chest pain.  Respiratory: no shortness of breath.    Vitals Vital Signs [Data Includes: Last 1 Day]  Recorded: [id number] 03:36PM  Blood Pressure: 163 / 79 Temperature: 98.2 F Heart Rate: 89  Physical Exam Constitutional: Well nourished and well developed . No acute distress.  Pulmonary: No respiratory distress and normal respiratory rhythm and effort.  Cardiovascular: Heart rate and rhythm are normal . No peripheral edema.    Assessment Assessed  1. Benign prostatic hyperplasia with urinary obstruction (N40.1,N13.8) 2. Clot hematuria (R31.0)  He has had further bleeding with anemia but his urine has cleared off of plavix.  He is just on the baby ASA.   Plan Benign prostatic hyperplasia with urinary obstruction  1. Follow-up Schedule Surgery Office  Follow-up  Status: Hold For - Appointment   Requested for: [id number] Clot hematuria  2. URINE CULTURE; Status:Hold For - Specimen/Data Collection,Appointment; Requested  for:12May2016;   I am going to try to set him up for a cystoscopy with electrovaporization of the prostate.  I will hopefully be able to fulgurate the source of the bleeding and open a sufficient channel for him to void.  I have reviewed the risks in detail.  He will stay off of the plavix until then.  I will stop the Rapaflo since we will be doing surgery.  He will continue the finasteride.   Discussion/Summary CC: Dr. [name], [facility] with Dr. Erlinda Hong and Dr. Marjean Donna.

## 2014-07-06 NOTE — Progress Notes (Signed)
[facility]  Patient Status: Active (receiving services up to time of hospitalization)  [facility] is providing the following services: RN, PT and ST  If patient discharges after hours, please call [phone].   Henry [name] 07/06/2014, 2:31 PM

## 2014-07-06 NOTE — Progress Notes (Signed)
Patient ID: Henry [name], male   DOB: January 25, 1935, 80 y.o.   MRN: [id number]    Subjective: Henry [name] was readmitted with clot retention and obstructive uropathy with a Cr of about 6.   His foley was changed and the Cr has declined to about 2 with good UOP.  The urine is slightly bloody today.   He is scheduled for TURP on Thursday and I will plan to proceed as scheduled.  ROS:  Review of Systems  Constitutional: Negative for fever.  Gastrointestinal: Negative for abdominal pain.  Genitourinary: Positive for hematuria.    Anti-infectives: Anti-infectives    Start     Dose/Rate Route Frequency Ordered Stop   07/06/14 1500  cefTRIAXone (ROCEPHIN) 1 g in dextrose 5 % 50 mL IVPB - Premix     1 g 100 mL/hr over 30 Minutes Intravenous Every 24 hours 07/05/14 1542     07/05/14 1430  cefTRIAXone (ROCEPHIN) 1 g in dextrose 5 % 50 mL IVPB     1 g 100 mL/hr over 30 Minutes Intravenous  Once 07/05/14 1426 07/05/14 1535      Current Facility-Administered Medications  Medication Dose Route Frequency Provider Last Rate Last Dose  . 0.9 %  sodium chloride infusion   Intravenous Continuous Oswald Hillock, MD 100 mL/hr at 07/06/14 0131    . acetaminophen (TYLENOL) tablet 650 mg  650 mg Oral Q6H PRN Oswald Hillock, MD   650 mg at 07/05/14 1734   Or  . acetaminophen (TYLENOL) suppository 650 mg  650 mg Rectal Q6H PRN Oswald Hillock, MD      . ALPRAZolam Duanne Moron) tablet 0.5 mg  0.5 mg Oral TID PRN Oswald Hillock, MD      . amLODipine (NORVASC) tablet 5 mg  5 mg Oral Daily Oswald Hillock, MD   5 mg at 07/06/14 0959  . atorvastatin (LIPITOR) tablet 40 mg  40 mg Oral QPM Christina P Rama, MD      . cefTRIAXone (ROCEPHIN) 1 g in dextrose 5 % 50 mL IVPB - Premix  1 g Intravenous Q24H Oswald Hillock, MD      . finasteride (PROSCAR) tablet 5 mg  5 mg Oral Daily [name], MD   5 mg at 07/06/14 0958  . ondansetron (ZOFRAN) tablet 4 mg  4 mg Oral Q6H PRN Oswald Hillock, MD       Or  . ondansetron (ZOFRAN) injection  4 mg  4 mg Intravenous Q6H PRN Oswald Hillock, MD      . pantoprazole (PROTONIX) EC tablet 40 mg  40 mg Oral Daily Oswald Hillock, MD   40 mg at 07/06/14 0959  . tamsulosin (FLOMAX) capsule 0.4 mg  0.4 mg Oral Daily [name], MD   0.4 mg at 07/06/14 1740     Objective: Vital signs in last 24 hours: Temp:  [97.7 F (36.5 C)-98.2 F (36.8 C)] 97.7 F (36.5 C) (05/23 0506) Pulse Rate:  [81-91] 81 (05/23 0506) Resp:  [18-20] 18 (05/23 0506) BP: (138-155)/(51-70) 154/65 mmHg (05/23 0506) SpO2:  [94 %-97 %] 95 % (05/23 0506) Weight:  [98.8 kg (217 lb 13 oz)] 98.8 kg (217 lb 13 oz) (05/22 1550)  Intake/Output from previous day: 05/22 0701 - 05/23 0700 In: 2140 [P.O.:740; I.V.:1400] Out: 8144 [Urine:7640] Intake/Output this shift: Total I/O In: 240 [P.O.:240] Out: 1150 [Urine:1150]   Physical Exam  Lab Results:   Recent Labs  07/05/14 1210 07/06/14 0445  WBC 11.0* 6.6  HGB 9.6* 8.8*  HCT 30.1* 27.4*  PLT 362 291   BMET  Recent Labs  07/05/14 1210 07/06/14 0445  NA 135 140  K 4.3 4.1  CL 103 110  CO2 18* 23  GLUCOSE 138* 105*  BUN 55* 31*  CREATININE 6.08* 2.35*  CALCIUM 8.7* 8.5*   PT/INR No results for input(s): LABPROT, INR in the last 72 hours. ABG No results for input(s): PHART, HCO3 in the last 72 hours.  Invalid input(s): PCO2, PO2  Studies/Results: US Renal  07/05/2014   CLINICAL DATA:  Acute kidney injury  EXAM: RENAL / URINARY TRACT ULTRASOUND COMPLETE  COMPARISON:  Abdominal CT 06/02/2014  FINDINGS: Right Kidney:  Length: 12 cm. Echogenicity within normal limits. No mass or hydronephrosis visualized.  Left Kidney:  Length: 13 cm. Two simple appearing cysts located in the upper and interpolar kidney, 14 to 17 mm in diameter. Echogenicity is within normal limits. No hydronephrosis.  Bladder:  Successfully decompressed by Foley catheter.  Marked enlargement of the prostate gland, 7 x 7 x 5 cm.  IMPRESSION: 1. No hydronephrosis. 2. Marked  prostatomegaly. The bladder is decompressed by a well-positioned Foley catheter.   Electronically Signed   By: Monte Fantasia M.D.   On: 07/05/2014 21:35     Assessment: Recurrent clot retention with ARI now resolving following foley change.  Urine culture is pending.   Plan: I will proceed with TUR on Thursday.  If he is discharged home prior to Thursday please let me know.  If he is not to be discharged, he will need to be NPO p MN on Thursday and have his preop orders released.   CC: Dr. Altha Harm Rama.     LOS: 1 day    Henry [name] J 07/06/2014

## 2014-07-06 NOTE — Progress Notes (Addendum)
Progress Note   Henry [name] WJX:[id number] DOB: 1934/03/19 DOA: 07/05/2014 PCP: Lanette Hampshire, MD   Brief Narrative:   Henry [name] is an 80 y.o. male with a PMH of recent hematuria with BPH requiring indwelling catheter recently, hypertension, recent stroke with occluded left ICA status post angioplasty and stenting and hyperlipidemia who was admitted 07/05/14 with a chief complaint of a one-week history of generalized weakness, fatigue and chills. The patient was recently evaluated in the ED 06/17/14 with urinary retention requiring catheter exchange. He was seen by urology in follow-up and started on Bactrim for UTI last week. Upon initial evaluation ED, the patient was found to have recurrent urinary retention with a creatinine of 6.08 (baseline creatinine 0.8). The Foley was replaced in the ED.  Assessment/Plan:   Principal problem:  Acute renal failure secondary to obstructive uropathy from marked prostatomegaly / hematuria - Likely postobstructive uropathy as 1100 mL urine was obtained after replacing the Foley catheter.  - Renal ultrasound negative for hydronephrosis. - Creatinine markedly improved with Foley exchange. Resume Proscar and Rapaflo. - Urologist, Dr. Jeffie Pollock consulted who will see the patient in the hospital.  He is scheduled for TURP 07/09/14.  Active problems:  Hypertension - Continue Norvasc.  UTI - Continue empiric Rocephin. - Follow-up urine cultures  Recent CVA with expressive aphasia - No deficits, patient able to ambulate.  - Plavix is currently on hold due to hematuria. - Resume statin.  Right lower extremity swelling - Follow-up venous Doppler of lower extremity to rule out DVT.    DVT Prophylaxis - SCDs ordered.  Code Status: Full. Family Communication: Wife, Leonia Reader, updated at the bedside. Disposition Plan: Home when renal function back to normal and urine culture back, likely 1-2 more days.   IV Access:    Peripheral  IV   Procedures and diagnostic studies:   US Renal  07/05/2014   CLINICAL DATA:  Acute kidney injury  EXAM: RENAL / URINARY TRACT ULTRASOUND COMPLETE  COMPARISON:  Abdominal CT 06/02/2014  FINDINGS: Right Kidney:  Length: 12 cm. Echogenicity within normal limits. No mass or hydronephrosis visualized.  Left Kidney:  Length: 13 cm. Two simple appearing cysts located in the upper and interpolar kidney, 14 to 17 mm in diameter. Echogenicity is within normal limits. No hydronephrosis.  Bladder:  Successfully decompressed by Foley catheter.  Marked enlargement of the prostate gland, 7 x 7 x 5 cm.  IMPRESSION: 1. No hydronephrosis. 2. Marked prostatomegaly. The bladder is decompressed by a well-positioned Foley catheter.   Electronically Signed   By: Monte Fantasia M.D.   On: 07/05/2014 21:35     Medical Consultants:    None.  Anti-Infectives:    None.  Subjective:   Henry [name] denies any abdominal or suprapubic pain.  No nausea or vomiting.  No fever/chills.  Still with some bloody urine/blood clots noted in tubing despite being off Plavix.  Objective:    Filed Vitals:   07/05/14 1413 07/05/14 1550 07/05/14 2042 07/06/14 0506  BP: 138/62 155/70 144/51 154/65  Pulse: 91 87 83 81  Temp: 98.2 F (36.8 C) 98.1 F (36.7 C) 97.9 F (36.6 C) 97.7 F (36.5 C)  TempSrc: Oral Oral Oral Oral  Resp: 20 18 18 18   Height:  5\' 10"  (1.778 m)    Weight:  98.8 kg (217 lb 13 oz)    SpO2: 96% 97% 94% 95%    Intake/Output Summary (Last 24 hours) at 07/06/14 0746 Last data  filed at 07/06/14 0741  Gross per 24 hour  Intake   2140 ml  Output   7990 ml  Net  -5850 ml    Exam: Gen:  NAD Cardiovascular:  RRR, No M/R/G Respiratory:  Lungs CTAB Gastrointestinal:  Abdomen soft, NT/ND, + BS Extremities:  RLE swelling   Data Reviewed:    Labs: Basic Metabolic Panel:  Recent Labs Lab 07/05/14 1210 07/06/14 0445  NA 135 140  K 4.3 4.1  CL 103 110  CO2 18* 23  GLUCOSE 138* 105*   BUN 55* 31*  CREATININE 6.08* 2.35*  CALCIUM 8.7* 8.5*   GFR Estimated Creatinine Clearance: 31 mL/min (by C-G formula based on Cr of 2.35). Liver Function Tests:  Recent Labs Lab 07/05/14 1210 07/06/14 0445  AST 27 24  ALT 28 27  ALKPHOS 52 49  BILITOT 0.5 0.5  PROT 6.6 5.8*  ALBUMIN 3.4* 2.9*   CBC:  Recent Labs Lab 07/05/14 1210 07/06/14 0445  WBC 11.0* 6.6  NEUTROABS 9.2*  --   HGB 9.6* 8.8*  HCT 30.1* 27.4*  MCV 84.1 85.1  PLT 362 291   Sepsis Labs:  Recent Labs Lab 07/05/14 1210 07/05/14 1230 07/06/14 0445  WBC 11.0*  --  6.6  LATICACIDVEN  --  1.23  --    Microbiology No results found for this or any previous visit (from the past 240 hour(s)).   Medications:   . amLODipine  5 mg Oral Daily  . cefTRIAXone (ROCEPHIN)  IV  1 g Intravenous Q24H  . pantoprazole  40 mg Oral Daily   Continuous Infusions: . sodium chloride 100 mL/hr at 07/06/14 0131    Time spent: 35 minutes with > 50% of time discussing current diagnostic test results, clinical impression and plan of care.   LOS: 1 day   Florentine Diekman  [facility] Pager [phone]. If unable to reach me by pager, please call my cell phone at [phone].  *Please refer to [email or web address], password [email or web address] to get updated schedule on who will round on this patient, as hospitalists switch teams weekly. If 7PM-7AM, please contact night-coverage at [email or web address], password [email or web address] for any overnight needs.  07/06/2014, 7:46 AM

## 2014-07-06 NOTE — Evaluation (Signed)
Physical Therapy One Time Evaluation Patient Details Name: Henry [name] MRN: [id number] DOB: 1934-09-09 Today's Date: 07/06/2014   History of Present Illness  80 y.o. male with a PMH of recent hematuria with BPH requiring indwelling catheter recently, hypertension, recent stroke with occluded left ICA status post angioplasty and stenting and hyperlipidemia who was admitted 07/05/14 with a chief complaint of a one-week history of generalized weakness, fatigue and chills and admitted for acute renal failure secondary to obstructive uropathy from marked prostatomegaly and hematuria.  Pt has TURP scheduled for Thursday.  Clinical Impression  Patient evaluated by Physical Therapy with no further acute PT needs identified. All education has been completed and the patient has no further questions.  Pt ambulated 350 feet in hallway without assist.  Spouse reports PT from stroke rehab went well, still with expressive aphasia and working with speech therapy.  Recommend continue f/u with home health services however no acute needs at present, and pt agreeable to ambulate with staff. PT is signing off. Thank you for this referral.     Follow Up Recommendations Home health PT    Equipment Recommendations  None recommended by PT    Recommendations for Other Services       Precautions / Restrictions Precautions Precautions: Fall      Mobility  Bed Mobility Overal bed mobility: Modified Independent                Transfers Overall transfer level: Needs assistance Equipment used: None Transfers: Sit to/from Stand Sit to Stand: Supervision            Ambulation/Gait Ambulation/Gait assistance: Min guard;Supervision Ambulation Distance (Feet): 300 Feet Assistive device: None Gait Pattern/deviations: WFL(Within Functional Limits)     General Gait Details: pt pushed IV pole, occasional unsteadiness however no true LOB or assist required, spouse reports improved strength and gait  since admission  Stairs            Wheelchair Mobility    Modified Rankin (Stroke Patients Only)       Balance                                             Pertinent Vitals/Pain Pain Assessment: No/denies pain    Home Living Family/patient expects to be discharged to:: Private residence Living Arrangements: Spouse/significant other Available Help at Discharge: Family Type of Home: House Home Access: Stairs to enter Entrance Stairs-Rails: None Technical brewer of Steps: 1 Home Layout: One level Home Equipment: None      Prior Function Level of Independence: Independent               Hand Dominance        Extremity/Trunk Assessment   Upper Extremity Assessment: RUE deficits/detail;LUE deficits/detail (observed difficulty with gathering and tying gown in front)           Lower Extremity Assessment: Overall WFL for tasks assessed         Communication   Communication: Expressive difficulties  Cognition Arousal/Alertness: Awake/alert Behavior During Therapy: WFL for tasks assessed/performed Overall Cognitive Status: Within Functional Limits for tasks assessed (answers with few words, expressive aphasia )                      General Comments      Exercises        Assessment/Plan  PT Assessment Patent does not need any further PT services  PT Diagnosis     PT Problem List    PT Treatment Interventions     PT Goals (Current goals can be found in the Care Plan section) Acute Rehab PT Goals PT Goal Formulation: All assessment and education complete, DC therapy    Frequency     Barriers to discharge        Co-evaluation               End of Session   Activity Tolerance: Patient tolerated treatment well Patient left: in chair;with call bell/phone within reach;with chair alarm set;with family/visitor present Nurse Communication: Mobility status         Time: 1525-1536 PT Time  Calculation (min) (ACUTE ONLY): 11 min   Charges:   PT Evaluation $Initial PT Evaluation Tier I: 1 Procedure     PT G Codes:        Korina Tretter,KATHrine E 07/06/2014, 3:51 PM Carmelia Bake, PT, DPT 07/06/2014 Pager: [phone]

## 2014-07-06 NOTE — Progress Notes (Signed)
VASCULAR LAB PRELIMINARY  PRELIMINARY  PRELIMINARY  PRELIMINARY  Right lower extremity venous duplex completed.    Preliminary report:  Right:  No evidence of DVT, superficial thrombosis, or Baker's cyst.  Kaulin Chaves, RVT 07/06/2014, 10:04 AM

## 2014-07-07 ENCOUNTER — Inpatient Hospital Stay (HOSPITAL_COMMUNITY): Admission: RE | Admit: 2014-07-07 | Payer: Medicare Other | Source: Ambulatory Visit

## 2014-07-07 ENCOUNTER — Encounter (HOSPITAL_COMMUNITY): Payer: Self-pay | Admitting: *Deleted

## 2014-07-07 DIAGNOSIS — R339 Retention of urine, unspecified: Secondary | ICD-10-CM

## 2014-07-07 LAB — CBC
HCT: 27.7 % — ABNORMAL LOW (ref 39.0–52.0)
Hemoglobin: 8.6 g/dL — ABNORMAL LOW (ref 13.0–17.0)
MCH: 26.9 pg (ref 26.0–34.0)
MCHC: 31 g/dL (ref 30.0–36.0)
MCV: 86.6 fL (ref 78.0–100.0)
PLATELETS: 341 10*3/uL (ref 150–400)
RBC: 3.2 MIL/uL — ABNORMAL LOW (ref 4.22–5.81)
RDW: 14.3 % (ref 11.5–15.5)
WBC: 6.4 10*3/uL (ref 4.0–10.5)

## 2014-07-07 LAB — BASIC METABOLIC PANEL
Anion gap: 7 (ref 5–15)
BUN: 18 mg/dL (ref 6–20)
CALCIUM: 8.4 mg/dL — AB (ref 8.9–10.3)
CHLORIDE: 109 mmol/L (ref 101–111)
CO2: 24 mmol/L (ref 22–32)
CREATININE: 1.01 mg/dL (ref 0.61–1.24)
GFR calc Af Amer: >60 mL/min (ref >60)
GFR calc non Af Amer: >60 mL/min (ref >60)
GLUCOSE: 108 mg/dL — AB (ref 65–99)
Potassium: 3.5 mmol/L (ref 3.5–5.1)
Sodium: 140 mmol/L (ref 135–145)

## 2014-07-07 MED ORDER — CEFUROXIME AXETIL 500 MG PO TABS: 500.0000 mg | ORAL_TABLET | Freq: Two times a day (BID) | ORAL | Status: DC

## 2014-07-07 MED ORDER — AMLODIPINE BESYLATE 5 MG PO TABS: 5.0000 mg | ORAL_TABLET | Freq: Every day | ORAL | Status: DC

## 2014-07-07 NOTE — Discharge Summary (Signed)
Physician Discharge Summary  Henry [name] IWL:[id number] DOB: 1934-12-19 DOA: 07/05/2014  PCP: Lanette Hampshire, MD  Admit date: 07/05/2014 Discharge date: 07/07/2014   Recommendations for Outpatient Follow-Up:   1. Patient has been discharged home with home health services including PT, ST and RN.   Discharge Diagnosis:   Principal Problem:    ARF (acute renal failure) as a complication of the indwelling Foley Active Problems:    Hypertension    Hyperlipidemia    Stroke    Expressive aphasia    Left middle cerebral artery stroke    Urinary retention    Hematuria    Right leg swelling    UTI (urinary tract infection)    Bladder outlet obstruction secondary to massive prostatomegaly   Discharge disposition:  Home.    Discharge Condition: Improved.  Diet recommendation: Low sodium, heart healthy.    History of Present Illness:   Henry [name] is an 80 y.o. male with a PMH of recent hematuria with BPH requiring indwelling catheter recently, hypertension, recent stroke with occluded left ICA status post angioplasty and stenting and hyperlipidemia who was admitted 07/05/14 with a chief complaint of a one-week history of generalized weakness, fatigue and chills. The patient was recently evaluated in the ED 06/17/14 with urinary retention requiring catheter exchange. He was seen by urology in follow-up and started on Bactrim for UTI last week. Upon initial evaluation ED, the patient was found to have recurrent urinary retention with a creatinine of 6.08 (baseline creatinine 0.8). The Foley was replaced in the ED.   Hospital Course by Problem:   Principal problem:  Acute renal failure as a complication of indwelling Foley, placed secondary to obstructive uropathy from marked prostatomegaly / hematuria - Likely postobstructive uropathy as 1100 mL urine was obtained after replacing the Foley catheter.  - Renal ultrasound negative for hydronephrosis. - Creatinine  markedly improved with Foley exchange. Continue Proscar and Rapaflo. - Seen and evaluated by Dr. Jeffie Pollock, scheduled for TURP 07/09/14.  Active problems:  Hypertension - Continue Norvasc.  UTI - Continue empiric Rocephin. - Urine cultures polymicrobial, 45,000 colonies. - Given high risk of infection, will continue Ceftin up until surgery.  Recent CVA with expressive aphasia - No deficits other than expressive aphasia, patient able to ambulate.  - Plavix is currently on hold due to hematuria. - Continue statin. - Speech therapy ordered for home.  Right lower extremity swelling - Doppler of lower extremity negative for DVT.  Medical Consultants:    Dr. [name], Urology   Discharge Exam:   Filed Vitals:   07/07/14 0457  BP: 121/51  Pulse: 69  Temp: 98.3 F (36.8 C)  Resp: 20   Filed Vitals:   07/06/14 0506 07/06/14 1345 07/06/14 2109 07/07/14 0457  BP: 154/65 152/60 154/68 121/51  Pulse: 81 89 81 69  Temp: 97.7 F (36.5 C) 97.6 F (36.4 C) 98.1 F (36.7 C) 98.3 F (36.8 C)  TempSrc: Oral Oral Oral Oral  Resp: 18 15 20 20   Height:      Weight:      SpO2: 95% 97% 96% 98%    Gen:  NAD Cardiovascular:  RRR, No M/R/G Respiratory: Lungs CTAB Gastrointestinal: Abdomen soft, NT/ND with normal active bowel sounds. Extremities: No C/E/C   The results of significant diagnostics from this hospitalization (including imaging, microbiology, ancillary and laboratory) are listed below for reference.     Procedures and Diagnostic Studies:   US Renal  07/05/2014   CLINICAL DATA:  Acute kidney injury  EXAM: RENAL / URINARY TRACT ULTRASOUND COMPLETE  COMPARISON:  Abdominal CT 06/02/2014  FINDINGS: Right Kidney:  Length: 12 cm. Echogenicity within normal limits. No mass or hydronephrosis visualized.  Left Kidney:  Length: 13 cm. Two simple appearing cysts located in the upper and interpolar kidney, 14 to 17 mm in diameter. Echogenicity is within normal limits. No  hydronephrosis.  Bladder:  Successfully decompressed by Foley catheter.  Marked enlargement of the prostate gland, 7 x 7 x 5 cm.  IMPRESSION: 1. No hydronephrosis. 2. Marked prostatomegaly. The bladder is decompressed by a well-positioned Foley catheter.   Electronically Signed   By: Monte Fantasia M.D.   On: 07/05/2014 21:35    Right lower extremity venous duplex  07/06/14  Summary: No evidence of deep vein thrombosis involving the right lower extremity and left common femoral vein.   Labs:   Basic Metabolic Panel:  Recent Labs Lab 07/05/14 1210 07/06/14 0445 07/07/14 0415  NA 135 140 140  K 4.3 4.1 3.5  CL 103 110 109  CO2 18* 23 24  GLUCOSE 138* 105* 108*  BUN 55* 31* 18  CREATININE 6.08* 2.35* 1.01  CALCIUM 8.7* 8.5* 8.4*   GFR Estimated Creatinine Clearance: 72.2 mL/min (by C-G formula based on Cr of 1.01). Liver Function Tests:  Recent Labs Lab 07/05/14 1210 07/06/14 0445  AST 27 24  ALT 28 27  ALKPHOS 52 49  BILITOT 0.5 0.5  PROT 6.6 5.8*  ALBUMIN 3.4* 2.9*   CBC:  Recent Labs Lab 07/05/14 1210 07/06/14 0445 07/07/14 0415  WBC 11.0* 6.6 6.4  NEUTROABS 9.2*  --   --   HGB 9.6* 8.8* 8.6*  HCT 30.1* 27.4* 27.7*  MCV 84.1 85.1 86.6  PLT 362 291 341   Microbiology Recent Results (from the past 240 hour(s))  Urine culture     Status: None   Collection Time: 07/05/14 12:40 PM  Result Value Ref Range Status   Specimen Description URINE, CLEAN CATCH  Final   Special Requests NONE  Final   Colony Count   Final    45,000 COLONIES/ML Performed at [facility]    Culture   Final    Multiple bacterial morphotypes present, none predominant. Suggest appropriate recollection if clinically indicated. Performed at [facility]    Report Status 07/06/2014 FINAL  Final     Discharge Instructions:   Discharge Instructions    Call MD for:  extreme fatigue    Complete by:  As directed      Call MD for:  persistant nausea and vomiting     Complete by:  As directed      Call MD for:  temperature >100.4    Complete by:  As directed      Call MD for:    Complete by:  As directed   Lack of urine output in Foley.     Diet - low sodium heart healthy    Complete by:  As directed      Discharge instructions    Complete by:  As directed   You were cared for by Dr. Jacquelynn Cree  (a hospitalist) during your hospital stay. If you have any questions about your discharge medications or the care you received while you were in the hospital after you are discharged, you can call the unit and ask to speak with the hospitalist on call if the hospitalist that took care of you is not available. Once you are discharged,  your primary care physician will handle any further medical issues. Please note that NO REFILLS for any discharge medications will be authorized once you are discharged, as it is imperative that you return to your primary care physician (or establish a relationship with a primary care physician if you do not have one) for your aftercare needs so that they can reassess your need for medications and monitor your lab values.  Any outstanding tests can be reviewed by your PCP at your follow up visit.  It is also important to review any medicine changes with your PCP.  Please bring these d/c instructions with you to your next visit so your physician can review these changes with you.  If you do not have a primary care physician, you can call [phone] for a physician referral.  It is highly recommended that you obtain a PCP for hospital follow up.     Face-to-face encounter (required for Medicare/Medicaid patients)    Complete by:  As directed   I RAMA,CHRISTINA certify that this patient is under my care and that I, or a nurse practitioner or physician's assistant working with me, had a face-to-face encounter that meets the physician face-to-face encounter requirements with this patient on 07/07/2014. The encounter with the patient was in whole,  or in part for the following medical condition(s) which is the primary reason for home health care (List medical condition): Recent CVA, resume services for PT and ST.  Will need RN for chronic indwelling foley with skilled assessment, recurrent ARF from foley malfunction.  The encounter with the patient was in whole, or in part, for the following medical condition, which is the primary reason for home health care:  Recent CVA with expressive aphasia, chronic indwelling foley from prostatomegaly  I certify that, based on my findings, the following services are medically necessary home health services:   Nursing Physical therapy Speech language pathology    Reason for Medically Necessary Home Health Services:   Skilled Nursing- Skilled Assessment/Observation Skilled Nursing- Teaching of Disease Process/Symptom Management Therapy- Therapeutic Exercises to Increase Strength and Endurance Therapy- Skilled Evaluation of Speech Comprehension and Safe Swallowing    My clinical findings support the need for the above services:  Unable to leave home safely without assistance and/or assistive device  Further, I certify that my clinical findings support that this patient is homebound due to:  Unable to leave home safely without assistance     Home Health    Complete by:  As directed   To provide the following care/treatments:   PT SLP RN       Increase activity slowly    Complete by:  As directed             Medication List    STOP taking these medications        aspirin 81 MG EC tablet     clopidogrel 75 MG tablet  Commonly known as:  PLAVIX     doxycycline 100 MG capsule  Commonly known as:  VIBRAMYCIN     sulfamethoxazole-trimethoprim 800-160 MG per tablet  Commonly known as:  BACTRIM DS,SEPTRA DS      TAKE these medications        ALPRAZolam 0.5 MG tablet  Commonly known as:  XANAX  Take 1 tablet (0.5 mg total) by mouth 3 (three) times daily as needed for anxiety.      amLODipine 5 MG tablet  Commonly known as:  NORVASC  Take 1 tablet (5 mg total) by  mouth at bedtime.     atorvastatin 40 MG tablet  Commonly known as:  LIPITOR  Take 1 tablet (40 mg total) by mouth every evening.     cefUROXime 500 MG tablet  Commonly known as:  CEFTIN  Take 1 tablet (500 mg total) by mouth 2 (two) times daily with a meal.     diphenhydramine-acetaminophen 25-500 MG Tabs  Commonly known as:  TYLENOL PM  Take 2 tablets by mouth daily as needed (pain).     esomeprazole 40 MG capsule  Commonly known as:  NEXIUM  Take 40 mg by mouth every morning.     finasteride 5 MG tablet  Commonly known as:  PROSCAR  Take 1 tablet (5 mg total) by mouth daily.     neomycin-bacitracin-polymyxin ointment  Commonly known as:  NEOSPORIN  Apply 1 application topically daily. Catheter insertion     silodosin 8 MG Caps capsule  Commonly known as:  RAPAFLO  Take 8 mg by mouth daily with breakfast.           Follow-up Information    Schedule an appointment as soon as possible for a visit with Lanette Hampshire, MD.   Specialty:  Family Medicine   Why:  As needed   Contact information:   [place] [address] [phone]       Follow up with Malka So, MD.   Specialty:  Urology   Why:  At your scheduled appointment time for surgery.   Contact information:   [place] [address] [phone]       Follow up with [facility].   Why:  HHRN/HHPT/HHST   Contact information:   [address] [place] [address] [phone]        Time coordinating discharge: 35 minutes.  Signed:  RAMA,CHRISTINA  Pager [phone] [facility] 07/07/2014, 4:11 PM

## 2014-07-07 NOTE — Clinical Documentation Improvement (Signed)
  Complications cannot be assumed and/or inferred and must be documented as such in the medical record.  Please clarify if the patient's acute renal failure is considered as a:   - Complication from the patient's chronic indwelling foley catheter   - Is not a complication from the patient's indwelling foley catheter   - Unable to clinically determine   Thank You, Erling Conte ,RN Clinical Documentation Specialist:  [facility]

## 2014-07-07 NOTE — Discharge Instructions (Signed)
Acute Kidney Injury Acute kidney injury is a disease in which there is sudden (acute) damage to the kidneys. The kidneys are 2 organs that lie on either side of the spine between the middle of the back and the front of the abdomen. The kidneys:  Remove wastes and extra water from the blood.   Produce important hormones. These help keep bones strong, regulate blood pressure, and help create red blood cells.   Balance the fluids and chemicals in the blood and tissues. A small amount of kidney damage may not cause problems, but a large amount of damage may make it difficult or impossible for the kidneys to work the way they should. Acute kidney injury may develop into long-lasting (chronic) kidney disease. It may also develop into a life-threatening disease called end-stage kidney disease. Acute kidney injury can get worse very quickly, so it should be treated right away. Early treatment may prevent other kidney diseases from developing.  CAUSES   A problem with blood flow to the kidneys. This may be caused by:   Blood loss.   Heart disease.   Severe burns.   Liver disease.  Direct damage to the kidneys. This may be caused by:  Some medicines.   A kidney infection.   Poisoning or consuming toxic substances.   A surgical wound.   A blow to the kidney area.   A problem with urine flow. This may be caused by:   Cancer.   Kidney stones.   An enlarged prostate. SYMPTOMS   Swelling (edema) of the legs, ankles, or feet.   Tiredness (lethargy).   Nausea or vomiting.   Confusion.   Problems with urination, such as:   Painful or burning feeling during urination.   Decreased urine production.   Frequent accidents in children who are potty trained.   Bloody urine.   Muscle twitches and cramps.   Shortness of breath.   Seizures.   Chest pain or pressure. Sometimes, no symptoms are present. DIAGNOSIS Acute kidney injury may be detected  and diagnosed by tests, including blood, urine, imaging, or kidney biopsy tests.  TREATMENT Treatment of acute kidney injury varies depending on the cause and severity of the kidney damage. In mild cases, no treatment may be needed. The kidneys may heal on their own. If acute kidney injury is more severe, your caregiver will treat the cause of the kidney damage, help the kidneys heal, and prevent complications from occurring. Severe cases may require a procedure to remove toxic wastes from the body (dialysis) or surgery to repair kidney damage. Surgery may involve:   Repair of a torn kidney.   Removal of an obstruction. Most of the time, you will need to stay overnight at the hospital.  HOME CARE INSTRUCTIONS:  Follow your prescribed diet.  Only take over-the-counter or prescription medicines as directed by your caregiver.  Do not take any new medicines (prescription, over-the-counter, or nutritional supplements) unless approved by your caregiver. Many medicines can worsen your kidney damage or need to have the dose adjusted.   Keep all follow-up appointments as directed by your caregiver.  Observe your condition to make sure you are healing as expected. SEEK IMMEDIATE MEDICAL CARE IF:  You are feeling ill or have severe pain in the back or side.   Your symptoms return or you have new symptoms.  You have any symptoms of end-stage kidney disease. These include:   Persistent itchiness.   Loss of appetite.   Headaches.   Abnormally dark   or light skin.  Numbness in the hands or feet.   Easy bruising.   Frequent hiccups.   Menstruation stops.   You have a fever.  You have increased urine production.  You have pain or bleeding when urinating. MAKE SURE YOU:   Understand these instructions.  Will watch your condition.  Will get help right away if you are not doing well or get worse Document Released: 08/15/2010 Document Revised: 05/27/2012 Document  Reviewed: 09/29/2011 ExitCare Patient Information 2015 [employer]. This information is not intended to replace advice given to you by your health care provider. Make sure you discuss any questions you have with your health care provider.  

## 2014-07-07 NOTE — Care Management Note (Signed)
Case Management Note  Patient Details  Name: CREEDENCE KUNESH MRN: [id number] Date of Birth: Dec 05, 1934  Subjective/Objective:                    Action/Plan:   Expected Discharge Date:                  Expected Discharge Plan:  [place]  In-House Referral:     Discharge planning Services  CM Consult  Post Acute Care Choice:  [place] (Active w/[facility]-HHRN/PT/ST) Choice offered to:  Patient  DME Arranged:    DME Agency:     HH Arranged:  RN, PT, Speech Therapy [facility] Kristen rep aware of d/c & [place] resumption orders.) HH Agency:  [facility]  Status of Service:  Completed, signed off  Medicare Important Message Given:    Date Medicare IM Given:    Medicare IM give by:    Date Additional Medicare IM Given:    Additional Medicare Important Message give by:     If discussed at [place] of Stay Meetings, dates discussed:    Additional Comments:  Dessa Phi, RN 07/07/2014, 11:27 AM

## 2014-07-07 NOTE — Progress Notes (Signed)
07-07-14 1100 CBC done per anesthesia protocol, please note result viewable in Epic.Note sent to Dr.Wrenn's office pod [phone] and faxed to office [phone] , and called to [facility] for Dr. Jeffie Pollock.

## 2014-07-07 NOTE — Care Management Note (Signed)
Case Management Note  Patient Details  Name: Henry [name] MRN: [id number] Date of Birth: 02-May-1934  Subjective/Objective:                    Action/Plan:d/c home w/HHC-[facility].No further d/c needs or orders.   Expected Discharge Date:                  Expected Discharge Plan:  [place]  In-House Referral:     Discharge planning Services  CM Consult  Post Acute Care Choice:  [place] (Active w/[facility]-HHRN/PT/ST) Choice offered to:  Patient  DME Arranged:    DME Agency:     HH Arranged:  RN, PT, Speech Therapy [facility] Kristen rep aware of d/c & HHC resumption orders.) HH Agency:  [place] [facility]  Status of Service:  Completed, signed off  Medicare Important Message Given:    Date Medicare IM Given:    Medicare IM give by:    Date Additional Medicare IM Given:    Additional Medicare Important Message give by:     If discussed at [place] of Stay Meetings, dates discussed:    Additional Comments:  Dessa Phi, RN 07/07/2014, 11:26 AM

## 2014-07-08 ENCOUNTER — Ambulatory Visit: Payer: Self-pay | Admitting: Neurology

## 2014-07-08 DIAGNOSIS — I69322 Dysarthria following cerebral infarction: Secondary | ICD-10-CM | POA: Diagnosis not present

## 2014-07-08 DIAGNOSIS — N400 Enlarged prostate without lower urinary tract symptoms: Secondary | ICD-10-CM | POA: Diagnosis not present

## 2014-07-08 DIAGNOSIS — K219 Gastro-esophageal reflux disease without esophagitis: Secondary | ICD-10-CM | POA: Diagnosis not present

## 2014-07-08 DIAGNOSIS — I10 Essential (primary) hypertension: Secondary | ICD-10-CM | POA: Diagnosis not present

## 2014-07-08 DIAGNOSIS — Z87891 Personal history of nicotine dependence: Secondary | ICD-10-CM | POA: Diagnosis not present

## 2014-07-08 DIAGNOSIS — I69320 Aphasia following cerebral infarction: Secondary | ICD-10-CM | POA: Diagnosis not present

## 2014-07-09 ENCOUNTER — Ambulatory Visit (HOSPITAL_COMMUNITY): Payer: Medicare Other | Admitting: Anesthesiology

## 2014-07-09 ENCOUNTER — Encounter (HOSPITAL_COMMUNITY)
Admission: RE | Disposition: A | Discharge status: Home / Self Care | Payer: Self-pay | Source: Ambulatory Visit | Attending: Urology

## 2014-07-09 ENCOUNTER — Observation Stay (HOSPITAL_COMMUNITY)
Admission: RE | Admit: 2014-07-09 | Discharge: 2014-07-11 | Disposition: A | Discharge status: Home / Self Care | Payer: Medicare Other | Source: Ambulatory Visit | Attending: Urology | Admitting: Urology

## 2014-07-09 ENCOUNTER — Encounter (HOSPITAL_COMMUNITY): Payer: Self-pay | Admitting: Anesthesiology

## 2014-07-09 DIAGNOSIS — N029 Recurrent and persistent hematuria with unspecified morphologic changes: Secondary | ICD-10-CM | POA: Diagnosis not present

## 2014-07-09 DIAGNOSIS — Z79899 Other long term (current) drug therapy: Secondary | ICD-10-CM | POA: Insufficient documentation

## 2014-07-09 DIAGNOSIS — Z8673 Personal history of transient ischemic attack (TIA), and cerebral infarction without residual deficits: Secondary | ICD-10-CM | POA: Diagnosis not present

## 2014-07-09 DIAGNOSIS — E785 Hyperlipidemia, unspecified: Secondary | ICD-10-CM | POA: Diagnosis not present

## 2014-07-09 DIAGNOSIS — K219 Gastro-esophageal reflux disease without esophagitis: Secondary | ICD-10-CM | POA: Insufficient documentation

## 2014-07-09 DIAGNOSIS — Z888 Allergy status to other drugs, medicaments and biological substances status: Secondary | ICD-10-CM | POA: Insufficient documentation

## 2014-07-09 DIAGNOSIS — N401 Enlarged prostate with lower urinary tract symptoms: Principal | ICD-10-CM | POA: Diagnosis present

## 2014-07-09 DIAGNOSIS — D649 Anemia, unspecified: Secondary | ICD-10-CM | POA: Diagnosis not present

## 2014-07-09 DIAGNOSIS — Z7982 Long term (current) use of aspirin: Secondary | ICD-10-CM | POA: Diagnosis not present

## 2014-07-09 DIAGNOSIS — N400 Enlarged prostate without lower urinary tract symptoms: Secondary | ICD-10-CM | POA: Diagnosis not present

## 2014-07-09 DIAGNOSIS — Z7901 Long term (current) use of anticoagulants: Secondary | ICD-10-CM | POA: Insufficient documentation

## 2014-07-09 DIAGNOSIS — I10 Essential (primary) hypertension: Secondary | ICD-10-CM | POA: Diagnosis not present

## 2014-07-09 DIAGNOSIS — I739 Peripheral vascular disease, unspecified: Secondary | ICD-10-CM | POA: Insufficient documentation

## 2014-07-09 DIAGNOSIS — M1990 Unspecified osteoarthritis, unspecified site: Secondary | ICD-10-CM | POA: Insufficient documentation

## 2014-07-09 DIAGNOSIS — R338 Other retention of urine: Secondary | ICD-10-CM | POA: Diagnosis not present

## 2014-07-09 DIAGNOSIS — I639 Cerebral infarction, unspecified: Secondary | ICD-10-CM | POA: Diagnosis not present

## 2014-07-09 DIAGNOSIS — N138 Other obstructive and reflux uropathy: Secondary | ICD-10-CM | POA: Diagnosis present

## 2014-07-09 DIAGNOSIS — Z87891 Personal history of nicotine dependence: Secondary | ICD-10-CM | POA: Insufficient documentation

## 2014-07-09 HISTORY — DX: Presence of other specified devices: Z97.8

## 2014-07-09 HISTORY — DX: Presence of urogenital implants: Z96.0

## 2014-07-09 HISTORY — PX: TRANSURETHRAL RESECTION OF PROSTATE: SHX73

## 2014-07-09 LAB — CBC
HCT: 30 % — ABNORMAL LOW (ref 39.0–52.0)
Hemoglobin: 9.6 g/dL — ABNORMAL LOW (ref 13.0–17.0)
MCH: 27.6 pg (ref 26.0–34.0)
MCHC: 32 g/dL (ref 30.0–36.0)
MCV: 86.2 fL (ref 78.0–100.0)
PLATELETS: 403 10*3/uL — AB (ref 150–400)
RBC: 3.48 MIL/uL — ABNORMAL LOW (ref 4.22–5.81)
RDW: 14.1 % (ref 11.5–15.5)
WBC: 7.5 10*3/uL (ref 4.0–10.5)

## 2014-07-09 LAB — BASIC METABOLIC PANEL
Anion gap: 10 (ref 5–15)
BUN: 11 mg/dL (ref 6–20)
CALCIUM: 8.5 mg/dL — AB (ref 8.9–10.3)
CO2: 23 mmol/L (ref 22–32)
Chloride: 104 mmol/L (ref 101–111)
Creatinine, Ser: 0.82 mg/dL (ref 0.61–1.24)
GFR calc Af Amer: >60 mL/min (ref >60)
GFR calc non Af Amer: >60 mL/min (ref >60)
Glucose, Bld: 148 mg/dL — ABNORMAL HIGH (ref 65–99)
POTASSIUM: 3.6 mmol/L (ref 3.5–5.1)
SODIUM: 137 mmol/L (ref 135–145)

## 2014-07-09 LAB — TYPE AND SCREEN
ABO/RH(D): A POS
Antibody Screen: NEGATIVE

## 2014-07-09 LAB — ABO/RH: ABO/RH(D): A POS

## 2014-07-09 LAB — HEMOGLOBIN AND HEMATOCRIT, BLOOD
HCT: 31.8 % — ABNORMAL LOW (ref 39.0–52.0)
Hemoglobin: 10.1 g/dL — ABNORMAL LOW (ref 13.0–17.0)

## 2014-07-09 SURGERY — TURP (TRANSURETHRAL RESECTION OF PROSTATE)
Anesthesia: General | Site: Prostate

## 2014-07-09 MED ORDER — SODIUM CHLORIDE 0.9 % IV SOLN: Freq: Once | INTRAVENOUS | Status: DC

## 2014-07-09 MED ORDER — SODIUM CHLORIDE 0.9 % IV SOLN
INTRAVENOUS | Status: DC | PRN
  Administered 2014-07-09 (×2): via INTRAVENOUS

## 2014-07-09 MED ORDER — KCL IN DEXTROSE-NACL 20-5-0.45 MEQ/L-%-% IV SOLN
INTRAVENOUS | Status: DC
  Administered 2014-07-09 – 2014-07-10 (×4): via INTRAVENOUS
  Filled 2014-07-09 (×6): qty 1000

## 2014-07-09 MED ORDER — ZOLPIDEM TARTRATE 5 MG PO TABS
5.0000 mg | ORAL_TABLET | Freq: Every evening | ORAL | Status: DC | PRN
  Administered 2014-07-09 – 2014-07-10 (×2): 5 mg via ORAL
  Filled 2014-07-09 (×2): qty 1

## 2014-07-09 MED ORDER — ACETAMINOPHEN 325 MG PO TABS: 650.0000 mg | ORAL_TABLET | ORAL | Status: DC | PRN

## 2014-07-09 MED ORDER — PROPOFOL 10 MG/ML IV BOLUS
INTRAVENOUS | Status: AC
  Filled 2014-07-09: qty 20

## 2014-07-09 MED ORDER — LIDOCAINE HCL (CARDIAC) 20 MG/ML IV SOLN
INTRAVENOUS | Status: DC | PRN
  Administered 2014-07-09: 80 mg via INTRAVENOUS

## 2014-07-09 MED ORDER — PROPOFOL 10 MG/ML IV BOLUS
INTRAVENOUS | Status: DC | PRN
  Administered 2014-07-09: 50 mg via INTRAVENOUS
  Administered 2014-07-09: 20 mg via INTRAVENOUS
  Administered 2014-07-09: 130 mg via INTRAVENOUS
  Administered 2014-07-09 (×2): 30 mg via INTRAVENOUS

## 2014-07-09 MED ORDER — LIDOCAINE HCL (CARDIAC) 20 MG/ML IV SOLN
INTRAVENOUS | Status: AC
  Filled 2014-07-09: qty 5

## 2014-07-09 MED ORDER — BISACODYL 10 MG RE SUPP: 10.0000 mg | Freq: Every day | RECTAL | Status: DC | PRN

## 2014-07-09 MED ORDER — LIDOCAINE HCL 2 % EX GEL
CUTANEOUS | Status: AC
  Filled 2014-07-09: qty 10

## 2014-07-09 MED ORDER — HYDROCODONE-ACETAMINOPHEN 5-325 MG PO TABS: 1.0000 | ORAL_TABLET | Freq: Four times a day (QID) | ORAL | Status: DC | PRN

## 2014-07-09 MED ORDER — 0.9 % SODIUM CHLORIDE (POUR BTL) OPTIME
TOPICAL | Status: DC | PRN
  Administered 2014-07-09: 1000 mL

## 2014-07-09 MED ORDER — SODIUM CHLORIDE 0.9 % IR SOLN
Status: DC | PRN
  Administered 2014-07-09: 39000 mL

## 2014-07-09 MED ORDER — FENTANYL CITRATE (PF) 100 MCG/2ML IJ SOLN
25.0000 ug | INTRAMUSCULAR | Status: DC | PRN
  Administered 2014-07-09 (×4): 25 ug via INTRAVENOUS

## 2014-07-09 MED ORDER — FENTANYL CITRATE (PF) 100 MCG/2ML IJ SOLN
INTRAMUSCULAR | Status: AC
  Filled 2014-07-09: qty 2

## 2014-07-09 MED ORDER — ACETAMINOPHEN 10 MG/ML IV SOLN
1000.0000 mg | Freq: Once | INTRAVENOUS | Status: AC
  Administered 2014-07-09: 1000 mg via INTRAVENOUS

## 2014-07-09 MED ORDER — ONDANSETRON HCL 4 MG/2ML IJ SOLN
4.0000 mg | INTRAMUSCULAR | Status: DC | PRN
Start: 2014-07-09 — End: 2014-07-11

## 2014-07-09 MED ORDER — HYDROMORPHONE HCL 1 MG/ML IJ SOLN
0.5000 mg | INTRAMUSCULAR | Status: DC | PRN
  Administered 2014-07-09: 1 mg via INTRAVENOUS
  Filled 2014-07-09: qty 1

## 2014-07-09 MED ORDER — CIPROFLOXACIN IN D5W 400 MG/200ML IV SOLN
INTRAVENOUS | Status: AC
  Filled 2014-07-09: qty 200

## 2014-07-09 MED ORDER — FENTANYL CITRATE (PF) 100 MCG/2ML IJ SOLN
INTRAMUSCULAR | Status: DC | PRN
  Administered 2014-07-09 (×8): 25 ug via INTRAVENOUS

## 2014-07-09 MED ORDER — ATORVASTATIN CALCIUM 40 MG PO TABS
40.0000 mg | ORAL_TABLET | Freq: Every evening | ORAL | Status: DC
  Administered 2014-07-09 – 2014-07-10 (×2): 40 mg via ORAL
  Filled 2014-07-09 (×3): qty 1

## 2014-07-09 MED ORDER — DEXAMETHASONE SODIUM PHOSPHATE 10 MG/ML IJ SOLN
INTRAMUSCULAR | Status: AC
Start: 2014-07-09 — End: 2014-07-09
  Filled 2014-07-09: qty 1

## 2014-07-09 MED ORDER — SODIUM CHLORIDE 0.9 % IR SOLN
3000.0000 mL | Status: DC
  Administered 2014-07-09: 3000 mL

## 2014-07-09 MED ORDER — AMLODIPINE BESYLATE 5 MG PO TABS
5.0000 mg | ORAL_TABLET | Freq: Every day | ORAL | Status: DC
  Administered 2014-07-09 – 2014-07-10 (×2): 5 mg via ORAL
  Filled 2014-07-09 (×2): qty 1

## 2014-07-09 MED ORDER — HYDROCODONE-ACETAMINOPHEN 5-325 MG PO TABS
1.0000 | ORAL_TABLET | ORAL | Status: DC | PRN
  Administered 2014-07-10: 2 via ORAL
  Filled 2014-07-09: qty 2

## 2014-07-09 MED ORDER — PANTOPRAZOLE SODIUM 40 MG PO TBEC
40.0000 mg | DELAYED_RELEASE_TABLET | Freq: Every day | ORAL | Status: DC
  Administered 2014-07-09 – 2014-07-10 (×2): 40 mg via ORAL
  Filled 2014-07-09 (×3): qty 1

## 2014-07-09 MED ORDER — PHENYLEPHRINE 40 MCG/ML (10ML) SYRINGE FOR IV PUSH (FOR BLOOD PRESSURE SUPPORT)
PREFILLED_SYRINGE | INTRAVENOUS | Status: AC
  Filled 2014-07-09: qty 10

## 2014-07-09 MED ORDER — FINASTERIDE 5 MG PO TABS
5.0000 mg | ORAL_TABLET | Freq: Every day | ORAL | Status: DC
  Administered 2014-07-09 – 2014-07-10 (×2): 5 mg via ORAL
  Filled 2014-07-09 (×3): qty 1

## 2014-07-09 MED ORDER — HYOSCYAMINE SULFATE 0.125 MG SL SUBL
0.1250 mg | SUBLINGUAL_TABLET | SUBLINGUAL | Status: DC | PRN
Start: 2014-07-09 — End: 2014-07-11
  Filled 2014-07-09: qty 1

## 2014-07-09 MED ORDER — ALPRAZOLAM 0.5 MG PO TABS: 0.5000 mg | ORAL_TABLET | Freq: Three times a day (TID) | ORAL | Status: DC | PRN

## 2014-07-09 MED ORDER — BELLADONNA ALKALOIDS-OPIUM 16.2-60 MG RE SUPP
RECTAL | Status: AC
  Filled 2014-07-09: qty 1

## 2014-07-09 MED ORDER — DEXAMETHASONE SODIUM PHOSPHATE 10 MG/ML IJ SOLN
INTRAMUSCULAR | Status: DC | PRN
  Administered 2014-07-09: 10 mg via INTRAVENOUS

## 2014-07-09 MED ORDER — PROMETHAZINE HCL 25 MG/ML IJ SOLN
6.2500 mg | INTRAMUSCULAR | Status: DC | PRN
Start: 2014-07-09 — End: 2014-07-09

## 2014-07-09 MED ORDER — SODIUM CHLORIDE 0.9 % IV SOLN
20.0000 mg | INTRAVENOUS | Status: DC | PRN
  Administered 2014-07-09: 50 ug/min via INTRAVENOUS

## 2014-07-09 MED ORDER — DIPHENHYDRAMINE HCL 12.5 MG/5ML PO ELIX: 12.5000 mg | ORAL_SOLUTION | Freq: Four times a day (QID) | ORAL | Status: DC | PRN

## 2014-07-09 MED ORDER — PHENYLEPHRINE HCL 10 MG/ML IJ SOLN
INTRAMUSCULAR | Status: DC | PRN
  Administered 2014-07-09 (×3): 40 ug via INTRAVENOUS

## 2014-07-09 MED ORDER — DIPHENHYDRAMINE HCL 50 MG/ML IJ SOLN: 12.5000 mg | Freq: Four times a day (QID) | INTRAMUSCULAR | Status: DC | PRN

## 2014-07-09 MED ORDER — CEFUROXIME AXETIL 500 MG PO TABS
500.0000 mg | ORAL_TABLET | Freq: Two times a day (BID) | ORAL | Status: DC
  Administered 2014-07-09 – 2014-07-11 (×4): 500 mg via ORAL
  Filled 2014-07-09 (×5): qty 1

## 2014-07-09 MED ORDER — CIPROFLOXACIN IN D5W 400 MG/200ML IV SOLN
400.0000 mg | INTRAVENOUS | Status: AC
  Administered 2014-07-09: 400 mg via INTRAVENOUS

## 2014-07-09 MED ORDER — MAGNESIUM HYDROXIDE 400 MG/5ML PO SUSP: 30.0000 mL | Freq: Every day | ORAL | Status: DC | PRN

## 2014-07-09 SURGICAL SUPPLY — 23 items
BAG URINE DRAINAGE (UROLOGICAL SUPPLIES) ×2 IMPLANT
BAG URO CATCHER STRL LF (DRAPE) ×3 IMPLANT
BLADE SURG 15 STRL LF DISP TIS (BLADE) IMPLANT
BLADE SURG 15 STRL SS (BLADE)
CATH FOLEY 3WAY 30CC 22FR (CATHETERS) IMPLANT
CATH HEMA 3WAY 30CC 24FR COUDE (CATHETERS) ×2 IMPLANT
CATH URET 5FR 28IN OPEN ENDED (CATHETERS) IMPLANT
ELECT BIVAP BIPO 22/24 DONUT (ELECTROSURGICAL) ×3
ELECT REM PT RETURN 9FT ADLT (ELECTROSURGICAL) ×3
ELECTRD BIVAP BIPO 22/24 DONUT (ELECTROSURGICAL) IMPLANT
ELECTRODE REM PT RTRN 9FT ADLT (ELECTROSURGICAL) ×1 IMPLANT
GLOVE SURG SS PI 8.0 STRL IVOR (GLOVE) ×2 IMPLANT
GOWN STRL REUS W/TWL XL LVL3 (GOWN DISPOSABLE) ×3 IMPLANT
HOLDER FOLEY CATH W/STRAP (MISCELLANEOUS) IMPLANT
KIT ASPIRATION TUBING (SET/KITS/TRAYS/PACK) ×3 IMPLANT
LOOP CUT BIPOLAR 24F LRG (ELECTROSURGICAL) ×2 IMPLANT
MANIFOLD NEPTUNE II (INSTRUMENTS) ×3 IMPLANT
PACK CYSTO (CUSTOM PROCEDURE TRAY) ×3 IMPLANT
SUT ETHILON 3 0 PS 1 (SUTURE) IMPLANT
SYR 30ML LL (SYRINGE) IMPLANT
SYRINGE IRR TOOMEY STRL 70CC (SYRINGE) ×2 IMPLANT
TUBING CONNECTING 10 (TUBING) ×2 IMPLANT
TUBING CONNECTING 10' (TUBING) ×1

## 2014-07-09 NOTE — Anesthesia Procedure Notes (Signed)
Procedure Name: LMA Insertion Date/Time: 07/09/2014 10:11 AM Performed by: Danley Danker L Patient Re-evaluated:Patient Re-evaluated prior to inductionOxygen Delivery Method: Circle system utilized Preoxygenation: Pre-oxygenation with 100% oxygen Intubation Type: IV induction Ventilation: Mask ventilation without difficulty LMA Size: 5.0 Number of attempts: 1 Airway Equipment and Method: Stylet Placement Confirmation: positive ETCO2 and breath sounds checked- equal and bilateral Tube secured with: Tape Dental Injury: Teeth and Oropharynx as per pre-operative assessment

## 2014-07-09 NOTE — Interval H&P Note (Signed)
History and Physical Interval Note:  His Hgb is 8.6.   He has clear urine today.   07/09/2014 9:54 AM  Henry [name]  has presented today for surgery, with the diagnosis of BPH WITH HEMATURIA   The various methods of treatment have been discussed with the patient and family. After consideration of risks, benefits and other options for treatment, the patient has consented to  Procedure(s): TRANSURETHRAL RESECTION WITH BUTTON  (N/A) as a surgical intervention .  The patient's history has been reviewed, patient examined, no change in status, stable for surgery.  I have reviewed the patient's chart and labs.  Questions were answered to the patient's satisfaction.     [place] J

## 2014-07-09 NOTE — Discharge Instructions (Signed)
Transurethral Resection of the Prostate °Care After °Refer to this sheet in the next few weeks. These instructions provide you with information on caring for yourself after your procedure. Your caregiver also may give you specific instructions. Your treatment has been planned according to current medical practices, but complications sometimes occur. Call your caregiver if you have any problems or questions after your procedure. °HOME CARE INSTRUCTIONS  °Recovery can take 4-6 weeks. Avoid alcohol, caffeinated drinks, and spicy foods for 2 weeks after your procedure. Drink enough fluids to keep your urine clear or pale yellow. Urinate as soon as you feel the urge to do so. Do not try to hold your urine for long periods of time. °During recovery you may experience pain caused by bladder spasms, which result in a very intense urge to urinate. Take all medicines as directed by your caregiver, including medicines for pain. Try to limit the amount of pain medicines you take because it can cause constipation. If you do become constipated, do not strain to move your bowels. Straining can increase bleeding. Constipation can be minimized by increasing the amount fluids and fiber in your diet. Your caregiver also may prescribe a stool softener. °Do not lift heavy objects (more than 5 lb [2.25 kg]) or perform exercises that cause you to strain for at least 1 month after your procedure. When sitting, you may want to sit in a soft chair or use a cushion. For the first 10 days after your procedure, avoid the following activities: °· Running. °· Strenuous work. °· Long walks. °· Riding in a car for extended periods. °· Sex. °SEEK MEDICAL CARE IF: °· You have difficulty urinating. °· You have blood in your urine that does not go away after you rest or increase your fluid intake. °· You have swelling in your penis or scrotum. °SEEK IMMEDIATE MEDICAL CARE IF:  °· You are suddenly unable to urinate. °· You notice blood clots in your  urine. °· You have chills. °· You have a fever. °· You have pain in your back or lower abdomen. °· You have pain or swelling in your legs. °MAKE SURE YOU:  °· Understand these instructions. °· Will watch your condition. °· Will get help right away if you are not doing well or get worse. °Document Released: 01/30/2005 Document Revised: 10/25/2011 Document Reviewed: 03/10/2011 °ExitCare® Patient Information ©2015 [employer]. This information is not intended to replace advice given to you by your health care provider. Make sure you discuss any questions you have with your health care provider. ° °

## 2014-07-09 NOTE — Anesthesia Postprocedure Evaluation (Addendum)
  Anesthesia Post-op Note  Patient: Henry [name]  Procedure(s) Performed: Procedure(s) (LRB): TRANSURETHRAL RESECTION of prostate WITH BUTTON  (N/A)  Patient Location: PACU  Anesthesia Type: general  Level of Consciousness: awake and alert   Airway and Oxygen Therapy: Patient Spontanous Breathing  Post-op Pain: mild  Post-op Assessment: Post-op Vital signs reviewed, Patient's Cardiovascular Status Stable, Respiratory Function Stable, Patent Airway and No signs of Nausea or vomiting  Last Vitals:  Filed Vitals:   07/09/14 1330  BP:   Pulse: 91  Temp: 36.9 C  Resp: 17    Post-op Vital Signs: stable   Complications: No apparent anesthesia complications. Moving all four extremities. Talking appropriately, complaining of foley.

## 2014-07-09 NOTE — Care Management Note (Signed)
Case Management Note  Patient Details  Name: TYREECE GELLES MRN: [id number] Date of Birth: 1934-08-11  Subjective/Objective:  80 y/o m admitted w/BPH.From home w//HHC.                  Action/Plan:d/c plan home w/Resumption of HHC.   Expected Discharge Date:                  Expected Discharge Plan:  Home/Self Care  In-House Referral:     Discharge planning Services  CM Consult  Post Acute Care Choice:  Home Health (Active w/[facility] HHRN/PT/ST) Choice offered to:     DME Arranged:    DME Agency:     HH Arranged:    HH Agency:     Status of Service:  In process, will continue to follow  Medicare Important Message Given:    Date Medicare IM Given:    Medicare IM give by:    Date Additional Medicare IM Given:    Additional Medicare Important Message give by:     If discussed at [place] of Stay Meetings, dates discussed:    Additional Comments:  Dessa Phi, RN 07/09/2014, 3:33 PM

## 2014-07-09 NOTE — Progress Notes (Signed)
Moving all extremities, follows commands, speech garbled, unable to understand. Dr. Delma Post into see.  1250 Speech improving, able to understand. C/O catheter pain. Meds given.

## 2014-07-09 NOTE — Brief Op Note (Signed)
07/09/2014  5:14 PM  PATIENT:  Henry [name]  80 y.o. male  PRE-OPERATIVE DIAGNOSIS:  BPH WITH HEMATURIA   POST-OPERATIVE DIAGNOSIS:  BPH with hematuria  PROCEDURE:  Procedure(s): TRANSURETHRAL RESECTION of prostate WITH BUTTON  (N/A)  SURGEON:  Surgeon(s) and Role:    * [name], MD - Primary  PHYSICIAN ASSISTANT:   ASSISTANTS: none   ANESTHESIA:   general  EBL:  Total I/O In: 1200 [I.V.:1100; Other:100] Out: 300 [Urine:300]  BLOOD ADMINISTERED:none  DRAINS: Urinary Catheter (Foley)   LOCAL MEDICATIONS USED:  NONE  SPECIMEN:  Source of Specimen:  prostate chips  DISPOSITION OF SPECIMEN:  PATHOLOGY  COUNTS:  YES  TOURNIQUET:  * No tourniquets in log *  DICTATION: .Other Dictation: Dictation Number [id number]  PLAN OF CARE: Admit for overnight observation  PATIENT DISPOSITION:  PACU - hemodynamically stable.   Delay start of Pharmacological VTE agent (>24hrs) due to surgical blood loss or risk of bleeding: yes

## 2014-07-09 NOTE — Progress Notes (Signed)
Patient ID: Henry [name], male   DOB: 10/24/1934, 80 y.o.   MRN: [id number] Day of Surgery  Subjective: He is doing well post op with some catheter discomfort.   His labs are ok with a normal sodium and an increased Hgb.  ROS:  ROS  Anti-infectives: Anti-infectives    Start     Dose/Rate Route Frequency Ordered Stop   07/09/14 1700  cefUROXime (CEFTIN) tablet 500 mg     500 mg Oral 2 times daily with meals 07/09/14 1433     07/09/14 0754  ciprofloxacin (CIPRO) IVPB 400 mg     400 mg 200 mL/hr over 60 Minutes Intravenous 60 min pre-op 07/09/14 0754 07/09/14 1115      Current Facility-Administered Medications  Medication Dose Route Frequency Provider Last Rate Last Dose  . acetaminophen (TYLENOL) tablet 650 mg  650 mg Oral Q4H PRN [name], MD      . ALPRAZolam Duanne Moron) tablet 0.5 mg  0.5 mg Oral TID PRN [name], MD      . amLODipine (NORVASC) tablet 5 mg  5 mg Oral QHS [name], MD      . atorvastatin (LIPITOR) tablet 40 mg  40 mg Oral QPM [name], MD   40 mg at 07/09/14 1643  . bisacodyl (DULCOLAX) suppository 10 mg  10 mg Rectal Daily PRN [name], MD      . cefUROXime (CEFTIN) tablet 500 mg  500 mg Oral BID WC [name], MD   500 mg at 07/09/14 1643  . dextrose 5 % and 0.45 % NaCl with KCl 20 mEq/L infusion   Intravenous Continuous [name], MD 100 mL/hr at 07/09/14 1643    . diphenhydrAMINE (BENADRYL) injection 12.5 mg  12.5 mg Intravenous Q6H PRN [name], MD       Or  . diphenhydrAMINE (BENADRYL) 12.5 MG/5ML elixir 12.5 mg  12.5 mg Oral Q6H PRN [name], MD      . fentaNYL (SUBLIMAZE) 100 MCG/2ML injection           . finasteride (PROSCAR) tablet 5 mg  5 mg Oral Daily [name], MD   5 mg at 07/09/14 1643  . HYDROcodone-acetaminophen (NORCO/VICODIN) 5-325 MG per tablet 1-2 tablet  1-2 tablet Oral Q4H PRN [name], MD      . HYDROmorphone (DILAUDID) injection 0.5-1 mg  0.5-1 mg Intravenous Q2H PRN [name], MD   1 mg at 07/09/14 1446  . hyoscyamine  (LEVSIN SL) SL tablet 0.125 mg  0.125 mg Sublingual Q4H PRN [name], MD      . magnesium hydroxide (MILK OF MAGNESIA) suspension 30 mL  30 mL Oral Daily PRN [name], MD      . ondansetron [facility]) injection 4 mg  4 mg Intravenous Q4H PRN [name], MD      . pantoprazole (PROTONIX) EC tablet 40 mg  40 mg Oral Daily [name], MD   40 mg at 07/09/14 1643  . sodium chloride irrigation 0.9 % 3,000 mL  3,000 mL Irrigation Continuous [name], MD   3,000 mL at 07/09/14 1319  . zolpidem (AMBIEN) tablet 5 mg  5 mg Oral QHS PRN [name], MD         Objective: Vital signs in last 24 hours: Temp:  [97.6 F (36.4 C)-98.7 F (37.1 C)] 97.6 F (36.4 C) (05/26 1345) Pulse Rate:  [72-106] 89 (05/26 1345) Resp:  [13-19] 17 (05/26 1330) BP: (158-183)/(70-83) 158/70 mmHg (05/26 1345) SpO2:  [97 %-  100 %] 97 % (05/26 1345) Weight:  [95.879 kg (211 lb 6 oz)] 95.879 kg (211 lb 6 oz) (05/26 0738)  Intake/Output from previous day:   Intake/Output this shift: Total I/O In: 1200 [I.V.:1100; Other:100] Out: 300 [Urine:300]   Physical Exam  Lab Results:   Recent Labs  07/07/14 0415 07/09/14 0815 07/09/14 1247  WBC 6.4 7.5  --   HGB 8.6* 9.6* 10.1*  HCT 27.7* 30.0* 31.8*  PLT 341 403*  --    BMET  Recent Labs  07/07/14 0415 07/09/14 1510  NA 140 137  K 3.5 3.6  CL 109 104  CO2 24 23  GLUCOSE 108* 148*  BUN 18 11  CREATININE 1.01 0.82  CALCIUM 8.4* 8.5*   PT/INR No results for input(s): LABPROT, INR in the last 72 hours. ABG No results for input(s): PHART, HCO3 in the last 72 hours.  Invalid input(s): PCO2, PO2  Studies/Results: No results found.   Assessment: s/p Procedure(s): TRANSURETHRAL RESECTION of prostate   He is doing well post op.   Plan: I will maintain CBI overnight. I plan to keep him till Saturday with a voiding trial prior to discharge. Dr. Junious Silk will be seeing him for me in the morning.          Ander Wamser J 07/09/2014

## 2014-07-09 NOTE — Care Management Note (Signed)
Case Management Note  Patient Details  Name: KASSIUS BATTISTE MRN: [id number] Date of Birth: [birth year]  Subjective/Objective:                    Action/Plan:Patient will not need [place] @ d/c.   Expected Discharge Date:                  Expected Discharge Plan:  Home/Self Care  In-House Referral:     Discharge planning Services  CM Consult  Post Acute Care Choice:  Home Health (Was active w/[facility] HHRN/PT/ST. Will NOT need resumption.) Choice offered to:     DME Arranged:    DME Agency:     HH Arranged:    HH Agency:     Status of Service:  In process, will continue to follow  Medicare Important Message Given:    Date Medicare IM Given:    Medicare IM give by:    Date Additional Medicare IM Given:    Additional Medicare Important Message give by:     If discussed at [place] of Stay Meetings, dates discussed:    Additional Comments:  Dessa Phi, RN 07/09/2014, 3:37 PM

## 2014-07-09 NOTE — Anesthesia Preprocedure Evaluation (Addendum)
Anesthesia Evaluation  Patient identified by MRN, date of birth, ID band Patient awake  General Assessment Comment: Hypertension  . GERD (gastroesophageal reflux disease)  . Hypercholesteremia  . HOH (hard of hearing)  . Stroke    had a stroke 05/23/2014 . Arthritis  . Hx-TIA (transient ischemic attack)    hx of this- wife states 10 years ago-but Dr. Estil Daft told wife he had had several-has 2 stents in carotid artery-left        Reviewed: Allergy & Precautions, NPO status , Patient's Chart, lab work & pertinent test results  Airway Mallampati: II  TM Distance: >3 FB Neck ROM: Full    Dental no notable dental hx.    Pulmonary neg pulmonary ROS, former smoker,  breath sounds clear to auscultation  Pulmonary exam normal       Cardiovascular hypertension, Pt. on medications + Peripheral Vascular Disease negative cardio ROS Normal cardiovascular examRhythm:Regular Rate:Normal     Neuro/Psych CVA 05-23-14  Denies weakness. Does exhibit word searching and slow speech.   CVA, Residual Symptoms negative psych ROS   GI/Hepatic negative GI ROS, Neg liver ROS, GERD-  Medicated,  Endo/Other  negative endocrine ROS  Renal/GU Renal InsufficiencyRenal diseasenegative Renal ROSRecent renal insufficiency, now resolving.   Cr. 1.01   K 3.5  negative genitourinary   Musculoskeletal negative musculoskeletal ROS (+) Arthritis -,   Abdominal   Peds negative pediatric ROS (+)  Hematology negative hematology ROS (+) anemia , Recent significant Hgb drop. Now 8.6   Anesthesia Other Findings   Reproductive/Obstetrics negative OB ROS                           Anesthesia Physical Anesthesia Plan  ASA: IV  Anesthesia Plan: General   Post-op Pain Management:    Induction: Intravenous  Airway Management Planned: LMA  Additional Equipment:   Intra-op Plan:    Post-operative Plan: Extubation in OR  Informed Consent: I have reviewed the patients History and Physical, chart, labs and discussed the procedure including the risks, benefits and alternatives for the proposed anesthesia with the patient or authorized representative who has indicated his/her understanding and acceptance.   Dental advisory given  Plan Discussed with: CRNA  Anesthesia Plan Comments: (Increased risk for neurologic complications with recent CVA. Procedure is urgent.)       Anesthesia Quick Evaluation

## 2014-07-09 NOTE — Transfer of Care (Signed)
Immediate Anesthesia Transfer of Care Note  Patient: Henry [name]  Procedure(s) Performed: Procedure(s): TRANSURETHRAL RESECTION of prostate WITH BUTTON  (N/A)  Patient Location: PACU  Anesthesia Type:General  Level of Consciousness: sedated  Airway & Oxygen Therapy: Patient Spontanous Breathing and Patient connected to face mask oxygen  Post-op Assessment: Report given to RN and Post -op Vital signs reviewed and stable  Post vital signs: Reviewed and stable  Last Vitals:  Filed Vitals:   07/09/14 0738  BP: 170/75  Pulse: 72  Temp: 36.4 C  Resp: 16    Complications: No apparent anesthesia complications

## 2014-07-10 ENCOUNTER — Encounter (HOSPITAL_COMMUNITY): Payer: Self-pay | Admitting: Urology

## 2014-07-10 DIAGNOSIS — N029 Recurrent and persistent hematuria with unspecified morphologic changes: Secondary | ICD-10-CM | POA: Diagnosis not present

## 2014-07-10 DIAGNOSIS — K219 Gastro-esophageal reflux disease without esophagitis: Secondary | ICD-10-CM | POA: Diagnosis not present

## 2014-07-10 DIAGNOSIS — I739 Peripheral vascular disease, unspecified: Secondary | ICD-10-CM | POA: Diagnosis not present

## 2014-07-10 DIAGNOSIS — R338 Other retention of urine: Secondary | ICD-10-CM | POA: Diagnosis not present

## 2014-07-10 DIAGNOSIS — I10 Essential (primary) hypertension: Secondary | ICD-10-CM | POA: Diagnosis not present

## 2014-07-10 DIAGNOSIS — N401 Enlarged prostate with lower urinary tract symptoms: Secondary | ICD-10-CM | POA: Diagnosis not present

## 2014-07-10 LAB — BASIC METABOLIC PANEL
Anion gap: 8 (ref 5–15)
BUN: 12 mg/dL (ref 6–20)
CO2: 24 mmol/L (ref 22–32)
Calcium: 8.6 mg/dL — ABNORMAL LOW (ref 8.9–10.3)
Chloride: 102 mmol/L (ref 101–111)
Creatinine, Ser: 0.69 mg/dL (ref 0.61–1.24)
GFR calc Af Amer: >60 mL/min (ref >60)
GFR calc non Af Amer: >60 mL/min (ref >60)
GLUCOSE: 138 mg/dL — AB (ref 65–99)
Potassium: 3.6 mmol/L (ref 3.5–5.1)
Sodium: 134 mmol/L — ABNORMAL LOW (ref 135–145)

## 2014-07-10 LAB — HEMOGLOBIN AND HEMATOCRIT, BLOOD
HCT: 29 % — ABNORMAL LOW (ref 39.0–52.0)
Hemoglobin: 9.2 g/dL — ABNORMAL LOW (ref 13.0–17.0)

## 2014-07-10 NOTE — Op Note (Signed)
NAMEORLIN, KANN NO.:  [id number]  MEDICAL RECORD NO.:  [id number]  LOCATION:  7322                         FACILITY:  [facility]  PHYSICIAN:  Marshall Cork. Jeffie Pollock, M.D.    DATE OF BIRTH:  1934-07-23  DATE OF PROCEDURE:  07/09/2014 DATE OF DISCHARGE:                              OPERATIVE REPORT   PROCEDURE:  Transurethral resection of the prostate.  PREOPERATIVE DIAGNOSIS:  Benign prostatic hypertrophy with retention and recurrent hematuria.  POSTOPERATIVE DIAGNOSIS:  Benign prostatic hypertrophy with retention and recurrent hematuria.  SURGEON:  Marshall Cork. Jeffie Pollock, M.D.  ANESTHESIA:  General.  SPECIMENS:  Prostate chips.  DRAINS:  A 24-French 3-way Foley catheter.  BLOOD LOSS:  Approximately 200 mL.  COMPLICATIONS:  None.  INDICATIONS:  Henry [name] is a 80 year old white male who was hospitalized over the past several weeks for a stroke.  He developed urinary retention and had a Foley catheter placed.  He had gross hematuria with clot retention while on antiplatelet therapy.  He has had persistent retention despite Rapaflo and finasteride and has had recurring bouts of gross hematuria requiring admission for clot retention.  After reviewing the options, we have decided to proceed with transurethral resection of the prostate with the plan to use primarily the button electrode for the most effective hemostasis since we want to be able to keep him off the antiplatelet agents for the shortest possible time.  The only concern is that his prostate is approximately 170 g on ultrasound.  FINDINGS AND PROCEDURE:  He was given Cipro.  He was taken to the operating room where general anesthetic was induced.  He was placed in lithotomy position and fitted with PAS hose.  His Foley was removed. His perineum and genitalia were prepped with Betadine solution and he was draped in usual sterile fashion.  The 28-French continuous flow resectoscope sheath was inserted using  the visual obturator and 30-degree lens.  This was then fitted with a Wolf button electrode with the [place] handle and the 30-degree lens. Saline was used as the irrigant.  Initially there was some difficulty getting into the bladder due to bleeding, but I was eventually able to negotiate the scope into the bladder and found an area of bleeding in the right anterior bladder neck.  This was initially fulgurated with the button, and I was able to see more clearly.  Once adequate visualization was established, determined that the ureteral orifices were well away from the bladder neck, the bladder had some trabeculation with increased mucosal vascularity, but no tumors or stones were noted.  I then began by vaporizing the bladder neck down to the muscular fibers from 5 to 7 o'clock.  I then vaporized the right lobe of the prostate from bladder neck to apex to create a channel.  I created the bulk of the channel on the posterior lateral lobe and floor of the prostate alongside the verumontanum, but carried the vaporization up onto the anterior lateral lobe.  Once an adequate vaporization of the right lobe was performed including thorough cauterization for hemostasis, I turned my attention to the left lobe.  The left lobe was then vaporized from bladder neck to apex.  During this procedure, a flap of apical tissue was developed by creating a groove at approximately the 2 o'clock position.  Towards the end of the vaporization which was prolonged, I was working for approximately an hour and a half.  I felt that I could not effectively vaporize the flap of tissue, so the button was exchanged for a resectoscope loop and this remaining flap of tissue was resected, it was approximately 5 g, it was resected.  The loop was then used to ensure hemostasis in this area.  Once this was done, I felt an adequate channel had been created, and I evacuated the chips from the resection portion.  The loop  was then used to further cauterize the resected surface of the prostate.  Some additional bleeding had popped up at the bladder neck that required fulguration as well as some residual anterior bleeding, however, once hemostasis had been achieved, and the bladder was evacuated free of chips, I removed the scope and pressure on the bladder produced an excellent stream.  At this point, I placed a 24-French 3-way hematuria catheter and inflated the balloon with 30 mL of sterile water.  The catheter was hand irrigated, but the return did not clear, so it was felt that there was active bleeding.  The catheter was then removed, the resectoscope was reinserted, and some bleeding was identified at the apex and in the anterior prostate.  These areas were fulgurated successfully and adequate hemostasis was achieved.  I then removed the scope and reinserted the catheter without recurrence of the bleeding.  The balloon was filled with 30 mL of sterile fluid.  The catheter was hand irrigated with clear return and then placed to continuous irrigation and straight drainage.  The patient was then taken down from lithotomy position.  His anesthetic was reversed.  He was moved to the recovery room in stable condition.  There were no complications, but there was a prolonged resection time, approximately an hour and a half to an hour and 45 minutes.     Marshall Cork. Jeffie Pollock, M.D.     JJW/MEDQ  D:  07/09/2014  T:  07/10/2014  Job:  [id number]

## 2014-07-10 NOTE — Progress Notes (Signed)
Initial Nutrition Assessment  DOCUMENTATION CODES:  Obesity unspecified  INTERVENTION: - Continue Regular diet  NUTRITION DIAGNOSIS:  Unintentional weight loss related to acute illness as evidenced by per patient/family report, percent weight loss.  GOAL:  Patient will meet greater than or equal to 90% of their needs  MONITOR:  PO intake, Weight trends, Labs, I & O's  REASON FOR ASSESSMENT:  Malnutrition Screening Tool  ASSESSMENT: Pt seen for MST with BMI indicating obesity. Pt had transurethral resection of the prostate early this AM. No intakes documented but pt reports he had rice crispy cereal, eggs, and cantaloupe for breakfast. He indicates that he had a good appetite PTA and that he lost 20 lbs but that "this was over a long period of time." Weight hx review indicates 16 lb weight loss (7% body weight) in 1 month which is significant for time frame.  He states that he does not drink nutrition supplements such as Ensure or Boost at home.  Will monitor intakes and need for supplements during admission. Medications and labs reviewed.  Height:  Ht Readings from Last 1 Encounters:  07/10/14 5\' 10"  (1.778 m)    Weight:  Wt Readings from Last 1 Encounters:  07/10/14 211 lb 6.7 oz (95.9 kg)    Ideal Body Weight:  75.5 kg (kg)  Wt Readings from Last 10 Encounters:  07/10/14 211 lb 6.7 oz (95.9 kg)  07/05/14 217 lb 13 oz (98.8 kg)  06/29/14 223 lb 12.8 oz (101.515 kg)  06/26/14 227 lb (102.967 kg)  06/05/14 227 lb 4.7 oz (103.1 kg)  05/23/14 224 lb 3.3 oz (101.7 kg)  12/29/13 230 lb (104.327 kg)  03/20/13 233 lb (105.688 kg)  02/11/13 233 lb (105.688 kg)  12/24/12 233 lb (105.688 kg)    BMI:  Body mass index is 30.34 kg/(m^2).  Estimated Nutritional Needs:  Kcal:  1500-1700  Protein:  90-105 grams  Fluid:  2-2.3 L/day  Skin:  Reviewed, no issues  Diet Order:  Diet regular Room service appropriate?: Yes; Fluid consistency:: Thin  EDUCATION  NEEDS:  No education needs identified at this time   Intake/Output Summary (Last 24 hours) at 07/10/14 1157 Last data filed at 07/10/14 0900  Gross per 24 hour  Intake 6308.33 ml  Output   7900 ml  Net -1591.67 ml    Last BM:  5/26    Jarome Matin, RD, LDN Inpatient Clinical Dietitian Pager # [phone] After hours/weekend pager # [phone]

## 2014-07-10 NOTE — Progress Notes (Signed)
1 Day Post-Op Subjective: Patient without complaint. Ate breakfast.   Objective: Vital signs in last 24 hours: Temp:  [97.6 F (36.4 C)-98.7 F (37.1 C)] 98 F (36.7 C) (05/27 0446) Pulse Rate:  [72-106] 91 (05/27 0446) Resp:  [13-19] 18 (05/27 0446) BP: (143-183)/(69-99) 143/99 mmHg (05/27 0446) SpO2:  [97 %-100 %] 99 % (05/27 0446) Weight:  [95.879 kg (211 lb 6 oz)] 95.879 kg (211 lb 6 oz) (05/26 0738)  Intake/Output from previous day: 05/26 0701 - 05/27 0700 In: 5948.3 [P.O.:120; I.V.:2428.3] Out: 7200 [Urine:7200] Intake/Output this shift:    Physical Exam:  NAD Urine clear with slow CBI - one gtt about every 3 seconds.   Lab Results:  Recent Labs  07/09/14 0815 07/09/14 1247 07/10/14 0501  HGB 9.6* 10.1* 9.2*  HCT 30.0* 31.8* 29.0*   BMET  Recent Labs  07/09/14 1510 07/10/14 0501  NA 137 134*  K 3.6 3.6  CL 104 102  CO2 23 24  GLUCOSE 148* 138*  BUN 11 12  CREATININE 0.82 0.69  CALCIUM 8.5* 8.6*   No results for input(s): LABPT, INR in the last 72 hours. No results for input(s): LABURIN in the last 72 hours. Results for orders placed or performed during the hospital encounter of 07/05/14  Urine culture     Status: None   Collection Time: 07/05/14 12:40 PM  Result Value Ref Range Status   Specimen Description URINE, CLEAN CATCH  Final   Special Requests NONE  Final   Colony Count   Final    45,000 COLONIES/ML Performed at [facility]    Culture   Final    Multiple bacterial morphotypes present, none predominant. Suggest appropriate recollection if clinically indicated. Performed at [facility]    Report Status 07/06/2014 FINAL  Final    Studies/Results: No results found.  Assessment/Plan:  POD#1 TUVP/TURP -- stable. D/c cbi. Void trial in AM.      Marybell Robards 07/10/2014

## 2014-07-11 DIAGNOSIS — K219 Gastro-esophageal reflux disease without esophagitis: Secondary | ICD-10-CM | POA: Diagnosis not present

## 2014-07-11 DIAGNOSIS — I739 Peripheral vascular disease, unspecified: Secondary | ICD-10-CM | POA: Diagnosis not present

## 2014-07-11 DIAGNOSIS — N401 Enlarged prostate with lower urinary tract symptoms: Secondary | ICD-10-CM | POA: Diagnosis not present

## 2014-07-11 DIAGNOSIS — N029 Recurrent and persistent hematuria with unspecified morphologic changes: Secondary | ICD-10-CM | POA: Diagnosis not present

## 2014-07-11 DIAGNOSIS — I10 Essential (primary) hypertension: Secondary | ICD-10-CM | POA: Diagnosis not present

## 2014-07-11 DIAGNOSIS — R338 Other retention of urine: Secondary | ICD-10-CM | POA: Diagnosis not present

## 2014-07-11 NOTE — Progress Notes (Signed)
POD#2 from TURP No acute events overnight Foley removed this AM Passed his voiding trial BM yesterday  NAD AFVSS Abdomen soft   Recent Labs  07/09/14 0815 07/09/14 1247 07/10/14 0501  WBC 7.5  --   --   HGB 9.6* 10.1* 9.2*  HCT 30.0* 31.8* 29.0*    Recent Labs  07/09/14 1510 07/10/14 0501  NA 137 134*  K 3.6 3.6  CL 104 102  CO2 23 24  GLUCOSE 148* 138*  BUN 11 12  CREATININE 0.82 0.69  CALCIUM 8.5* 8.6*   No results for input(s): LABPT, INR in the last 72 hours. No results for input(s): LABURIN in the last 72 hours. Results for orders placed or performed during the hospital encounter of 07/05/14  Urine culture     Status: None   Collection Time: 07/05/14 12:40 PM  Result Value Ref Range Status   Specimen Description URINE, CLEAN CATCH  Final   Special Requests NONE  Final   Colony Count   Final    45,000 COLONIES/ML Performed at [facility]    Culture   Final    Multiple bacterial morphotypes present, none predominant. Suggest appropriate recollection if clinically indicated. Performed at [facility]    Report Status 07/06/2014 FINAL  Final    A/P: Successful voiding trial after TURP.  Home today.  No medications needed.

## 2014-07-11 NOTE — Discharge Summary (Signed)
Date of admission: 07/09/2014  Date of discharge: 07/11/2014  Admission diagnosis: BPH/Hematuria  Discharge diagnosis: same  Secondary diagnoses:  Patient Active Problem List   Diagnosis Date Noted  . BPH (benign prostatic hypertrophy) with urinary obstruction 07/09/2014  . Right leg swelling 07/06/2014  . UTI (urinary tract infection) 07/06/2014  . ARF (acute renal failure) as a complication of the indwelling Foley 07/05/2014  . Cerebral infarction due to embolism of left middle cerebral artery 06/29/2014  . Carotid stenosis 06/29/2014  . Urinary retention 06/29/2014  . Hematuria 06/29/2014  . Aphasia S/P CVA 05/28/2014  . Dysphagia, pharyngoesophageal phase 05/28/2014  . Left middle cerebral artery stroke 05/26/2014  . Coarctation of aorta, recurrent, post-intervention   . Expressive aphasia   . Acute right-sided weakness   . Stroke 05/23/2014  . S/P shoulder surgery 12/24/2012  . S/P arthroscopy of shoulder 12/16/2012  . Shoulder arthritis 12/13/2012  . Biceps tendon rupture, proximal 12/13/2012  . Partial tear of right subscapularis tendon 12/13/2012  . Hypertension 05/10/2011  . Hyperlipidemia 05/10/2011  . Barrett esophagus 05/10/2011    History and Physical: For full details, please see admission history and physical. Briefly, Henry [name] is a 80 y.o. year old patient with gross hematuria.   Hospital Course: Patient tolerated the procedure well.  He was then transferred to the floor after an uneventful PACU stay.  His hospital course was uncomplicated.  On POD#2  he had met discharge criteria: was eating a regular diet, was up and ambulating independently,  pain was well controlled, was voiding without a catheter, and was ready to for discharge.   Laboratory values:   Recent Labs  07/09/14 0815 07/09/14 1247 07/10/14 0501  WBC 7.5  --   --   HGB 9.6* 10.1* 9.2*  HCT 30.0* 31.8* 29.0*    Recent Labs  07/09/14 1510 07/10/14 0501  NA 137 134*  K 3.6  3.6  CL 104 102  CO2 23 24  GLUCOSE 148* 138*  BUN 11 12  CREATININE 0.82 0.69  CALCIUM 8.5* 8.6*   No results for input(s): LABPT, INR in the last 72 hours. No results for input(s): LABURIN in the last 72 hours. Results for orders placed or performed during the hospital encounter of 07/05/14  Urine culture     Status: None   Collection Time: 07/05/14 12:40 PM  Result Value Ref Range Status   Specimen Description URINE, CLEAN CATCH  Final   Special Requests NONE  Final   Colony Count   Final    45,000 COLONIES/ML Performed at [facility]    Culture   Final    Multiple bacterial morphotypes present, none predominant. Suggest appropriate recollection if clinically indicated. Performed at [facility]    Report Status 07/06/2014 FINAL  Final    Disposition: Home  Discharge instruction: The patient was instructed to be ambulatory but told to refrain from heavy lifting, strenuous activity, or driving.   Discharge medications:   Medication List    TAKE these medications        ALPRAZolam 0.5 MG tablet  Commonly known as:  XANAX  Take 1 tablet (0.5 mg total) by mouth 3 (three) times daily as needed for anxiety.     amLODipine 5 MG tablet  Commonly known as:  NORVASC  Take 1 tablet (5 mg total) by mouth at bedtime.     atorvastatin 40 MG tablet  Commonly known as:  LIPITOR  Take 1 tablet (40 mg total) by  mouth every evening.     cefUROXime 500 MG tablet  Commonly known as:  CEFTIN  Take 1 tablet (500 mg total) by mouth 2 (two) times daily with a meal.     esomeprazole 40 MG capsule  Commonly known as:  NEXIUM  Take 40 mg by mouth every morning.     finasteride 5 MG tablet  Commonly known as:  PROSCAR  Take 1 tablet (5 mg total) by mouth daily.     HYDROcodone-acetaminophen 5-325 MG per tablet  Commonly known as:  NORCO/VICODIN  Take 1 tablet by mouth every 6 (six) hours as needed for moderate pain.        Followup:      Follow-up  Information    Follow up with Malka So, MD.   Specialty:  Urology   Why:  call for an appt for 2-3 weeks after discharge to see me or the nurse practitioner.    Contact information:   [address] [place] [address] [phone]

## 2014-07-14 DIAGNOSIS — Z87891 Personal history of nicotine dependence: Secondary | ICD-10-CM | POA: Diagnosis not present

## 2014-07-14 DIAGNOSIS — K219 Gastro-esophageal reflux disease without esophagitis: Secondary | ICD-10-CM | POA: Diagnosis not present

## 2014-07-14 DIAGNOSIS — I10 Essential (primary) hypertension: Secondary | ICD-10-CM | POA: Diagnosis not present

## 2014-07-14 DIAGNOSIS — N400 Enlarged prostate without lower urinary tract symptoms: Secondary | ICD-10-CM | POA: Diagnosis not present

## 2014-07-14 DIAGNOSIS — I69322 Dysarthria following cerebral infarction: Secondary | ICD-10-CM | POA: Diagnosis not present

## 2014-07-14 DIAGNOSIS — I69320 Aphasia following cerebral infarction: Secondary | ICD-10-CM | POA: Diagnosis not present

## 2014-07-15 DIAGNOSIS — I69320 Aphasia following cerebral infarction: Secondary | ICD-10-CM | POA: Diagnosis not present

## 2014-07-15 DIAGNOSIS — K219 Gastro-esophageal reflux disease without esophagitis: Secondary | ICD-10-CM | POA: Diagnosis not present

## 2014-07-15 DIAGNOSIS — I10 Essential (primary) hypertension: Secondary | ICD-10-CM | POA: Diagnosis not present

## 2014-07-15 DIAGNOSIS — Z87891 Personal history of nicotine dependence: Secondary | ICD-10-CM | POA: Diagnosis not present

## 2014-07-15 DIAGNOSIS — I69322 Dysarthria following cerebral infarction: Secondary | ICD-10-CM | POA: Diagnosis not present

## 2014-07-15 DIAGNOSIS — N400 Enlarged prostate without lower urinary tract symptoms: Secondary | ICD-10-CM | POA: Diagnosis not present

## 2014-07-16 DIAGNOSIS — I69322 Dysarthria following cerebral infarction: Secondary | ICD-10-CM | POA: Diagnosis not present

## 2014-07-16 DIAGNOSIS — N400 Enlarged prostate without lower urinary tract symptoms: Secondary | ICD-10-CM | POA: Diagnosis not present

## 2014-07-16 DIAGNOSIS — K219 Gastro-esophageal reflux disease without esophagitis: Secondary | ICD-10-CM | POA: Diagnosis not present

## 2014-07-16 DIAGNOSIS — Z87891 Personal history of nicotine dependence: Secondary | ICD-10-CM | POA: Diagnosis not present

## 2014-07-16 DIAGNOSIS — I10 Essential (primary) hypertension: Secondary | ICD-10-CM | POA: Diagnosis not present

## 2014-07-16 DIAGNOSIS — I69320 Aphasia following cerebral infarction: Secondary | ICD-10-CM | POA: Diagnosis not present

## 2014-07-17 DIAGNOSIS — I10 Essential (primary) hypertension: Secondary | ICD-10-CM | POA: Diagnosis not present

## 2014-07-17 DIAGNOSIS — Z87891 Personal history of nicotine dependence: Secondary | ICD-10-CM | POA: Diagnosis not present

## 2014-07-17 DIAGNOSIS — K219 Gastro-esophageal reflux disease without esophagitis: Secondary | ICD-10-CM | POA: Diagnosis not present

## 2014-07-17 DIAGNOSIS — I69322 Dysarthria following cerebral infarction: Secondary | ICD-10-CM | POA: Diagnosis not present

## 2014-07-17 DIAGNOSIS — N400 Enlarged prostate without lower urinary tract symptoms: Secondary | ICD-10-CM | POA: Diagnosis not present

## 2014-07-17 DIAGNOSIS — I69320 Aphasia following cerebral infarction: Secondary | ICD-10-CM | POA: Diagnosis not present

## 2014-07-20 DIAGNOSIS — I10 Essential (primary) hypertension: Secondary | ICD-10-CM | POA: Diagnosis not present

## 2014-07-20 DIAGNOSIS — I69322 Dysarthria following cerebral infarction: Secondary | ICD-10-CM | POA: Diagnosis not present

## 2014-07-20 DIAGNOSIS — I69320 Aphasia following cerebral infarction: Secondary | ICD-10-CM | POA: Diagnosis not present

## 2014-07-20 DIAGNOSIS — K219 Gastro-esophageal reflux disease without esophagitis: Secondary | ICD-10-CM | POA: Diagnosis not present

## 2014-07-20 DIAGNOSIS — Z87891 Personal history of nicotine dependence: Secondary | ICD-10-CM | POA: Diagnosis not present

## 2014-07-20 DIAGNOSIS — N400 Enlarged prostate without lower urinary tract symptoms: Secondary | ICD-10-CM | POA: Diagnosis not present

## 2014-07-21 ENCOUNTER — Ambulatory Visit (INDEPENDENT_AMBULATORY_CARE_PROVIDER_SITE_OTHER): Payer: Medicare Other

## 2014-07-21 DIAGNOSIS — R339 Retention of urine, unspecified: Secondary | ICD-10-CM

## 2014-07-21 DIAGNOSIS — I63412 Cerebral infarction due to embolism of left middle cerebral artery: Secondary | ICD-10-CM

## 2014-07-21 DIAGNOSIS — N400 Enlarged prostate without lower urinary tract symptoms: Secondary | ICD-10-CM | POA: Diagnosis not present

## 2014-07-21 DIAGNOSIS — I6523 Occlusion and stenosis of bilateral carotid arteries: Secondary | ICD-10-CM

## 2014-07-21 DIAGNOSIS — I69322 Dysarthria following cerebral infarction: Secondary | ICD-10-CM | POA: Diagnosis not present

## 2014-07-21 DIAGNOSIS — Z87891 Personal history of nicotine dependence: Secondary | ICD-10-CM | POA: Diagnosis not present

## 2014-07-21 DIAGNOSIS — R319 Hematuria, unspecified: Secondary | ICD-10-CM

## 2014-07-21 DIAGNOSIS — I10 Essential (primary) hypertension: Secondary | ICD-10-CM | POA: Diagnosis not present

## 2014-07-21 DIAGNOSIS — I69320 Aphasia following cerebral infarction: Secondary | ICD-10-CM | POA: Diagnosis not present

## 2014-07-21 DIAGNOSIS — E785 Hyperlipidemia, unspecified: Secondary | ICD-10-CM

## 2014-07-21 DIAGNOSIS — K219 Gastro-esophageal reflux disease without esophagitis: Secondary | ICD-10-CM | POA: Diagnosis not present

## 2014-07-22 DIAGNOSIS — I69322 Dysarthria following cerebral infarction: Secondary | ICD-10-CM | POA: Diagnosis not present

## 2014-07-22 DIAGNOSIS — I69320 Aphasia following cerebral infarction: Secondary | ICD-10-CM | POA: Diagnosis not present

## 2014-07-22 DIAGNOSIS — I10 Essential (primary) hypertension: Secondary | ICD-10-CM | POA: Diagnosis not present

## 2014-07-22 DIAGNOSIS — K219 Gastro-esophageal reflux disease without esophagitis: Secondary | ICD-10-CM | POA: Diagnosis not present

## 2014-07-22 DIAGNOSIS — Z87891 Personal history of nicotine dependence: Secondary | ICD-10-CM | POA: Diagnosis not present

## 2014-07-22 DIAGNOSIS — N400 Enlarged prostate without lower urinary tract symptoms: Secondary | ICD-10-CM | POA: Diagnosis not present

## 2014-07-23 ENCOUNTER — Telehealth: Payer: Self-pay | Admitting: *Deleted

## 2014-07-23 DIAGNOSIS — N138 Other obstructive and reflux uropathy: Secondary | ICD-10-CM | POA: Diagnosis not present

## 2014-07-23 DIAGNOSIS — N401 Enlarged prostate with lower urinary tract symptoms: Secondary | ICD-10-CM | POA: Diagnosis not present

## 2014-07-23 NOTE — Telephone Encounter (Signed)
Sounds good to me.   Rosalin Hawking, MD PhD Stroke Neurology 07/23/2014 4:11 PM

## 2014-07-23 NOTE — Telephone Encounter (Signed)
Spoke with wife who states patient saw Dr Jeffie Pollock, urologist and was instructed to begin Plavix again. Wife states she manages his medications and has put Plavix 75 mg back in his medication boxes to begin tomorrow. She states he is also on ASA 81 mg daily. Medication list updated.

## 2014-07-24 DIAGNOSIS — N400 Enlarged prostate without lower urinary tract symptoms: Secondary | ICD-10-CM | POA: Diagnosis not present

## 2014-07-24 DIAGNOSIS — I69322 Dysarthria following cerebral infarction: Secondary | ICD-10-CM | POA: Diagnosis not present

## 2014-07-24 DIAGNOSIS — Z87891 Personal history of nicotine dependence: Secondary | ICD-10-CM | POA: Diagnosis not present

## 2014-07-24 DIAGNOSIS — I69320 Aphasia following cerebral infarction: Secondary | ICD-10-CM | POA: Diagnosis not present

## 2014-07-24 DIAGNOSIS — K219 Gastro-esophageal reflux disease without esophagitis: Secondary | ICD-10-CM | POA: Diagnosis not present

## 2014-07-24 DIAGNOSIS — I10 Essential (primary) hypertension: Secondary | ICD-10-CM | POA: Diagnosis not present

## 2014-07-29 ENCOUNTER — Ambulatory Visit (HOSPITAL_COMMUNITY): Payer: Medicare Other | Attending: Family Medicine | Admitting: Speech Pathology

## 2014-07-29 DIAGNOSIS — I69320 Aphasia following cerebral infarction: Secondary | ICD-10-CM | POA: Insufficient documentation

## 2014-07-29 NOTE — Therapy (Signed)
[place], [place], [address] Phone: [phone]   Fax:  [phone]  Speech Language Pathology Evaluation  Patient Details  Name: Henry [name] MRN: [id number] Date of Birth: [birth year] Referring Provider:  Marjean Donna, MD  Encounter Date: 07/29/2014      End of Session - 07/29/14 1612    Visit Number 1   Number of Visits 16   Date for SLP Re-Evaluation 09/28/14   Authorization Type Medicare   SLP Start Time 1055   SLP Stop Time  1139   SLP Time Calculation (min) 44 min   Activity Tolerance Patient tolerated treatment well      Past Medical History  Diagnosis Date  . Hypertension   . GERD (gastroesophageal reflux disease)   . Hypercholesteremia   . HOH (hard of hearing)   . Stroke     had a stroke 05/23/2014  . Hx-TIA (transient ischemic attack)     hx of this- wife states 10 years ago-but Dr. Estil Daft told wife he had had several-has 2 stents in carotid artery-left  . FOOT SPRAIN, RIGHT 07/18/2007    Qualifier: Diagnosis of  By: Aline Brochure MD, [name]    . Esophagitis 05/10/2011  . Accelerated hypertension 06/29/2014  . Arthritis     Back  . Foley catheter in place     Past Surgical History  Procedure Laterality Date  . Shoulder surgery Right     torn cartiledge  . Esophagogastroduodenoscopy  10/28/2010    Procedure: ESOPHAGOGASTRODUODENOSCOPY (EGD);  Surgeon: [name], MD;  [facility];  Service: Endoscopy;  Laterality: N/A;  10:30  . Colonoscopy    . Colonoscopy  05/17/2011    Procedure: COLONOSCOPY;  Surgeon: [name], MD;  [facility];  Service: Endoscopy;  Laterality: N/A;  830  . Resection distal clavical Right 12/13/2012    Procedure: RESECTION DISTAL CLAVICAL;  Surgeon: Carole Civil, MD;  [facility]: [facility];  Service: Orthopedics;  Laterality: Right;  . Shoulder arthroscopy with bicepstenotomy Right 12/13/2012    Procedure: SHOULDER ARTHROSCOPY  WITH BICEPS TENOTOMY, limited shoulder debridement;  Surgeon: Carole Civil, MD;  [facility];  Service: Orthopedics;  Laterality: Right;  . Esophagogastroduodenoscopy N/A 04/24/2014    Procedure: ESOPHAGOGASTRODUODENOSCOPY (EGD);  Surgeon: [name], MD;  [facility];  Service: Endoscopy;  Laterality: N/A;  815  . Radiology with anesthesia N/A 05/23/2014    Procedure: RADIOLOGY WITH ANESTHESIA;  Surgeon: Medication Radiologist, MD;  Location: [place];  Service: Radiology;  Laterality: N/A;  . Epidural steroid injection  05/22/2014    had this in lumbar and at 1 am on 05/23/2014 had a stroke  . Carotid stents  05/2014    2 stents in carotid artery on left side  . Shoulder surgery      right shoulder  . Eus N/A 06/26/2014    Procedure: FULL UPPER ENDOSCOPIC ULTRASOUND (EUS) RADIAL;  Surgeon: [name], MD;  [facility];  Service: Endoscopy;  Laterality: N/A;  . Esophagogastroduodenoscopy N/A 06/26/2014    Procedure: ESOPHAGOGASTRODUODENOSCOPY (EGD);  Surgeon: [name], MD;  Location: [facility];  Service: Endoscopy;  Laterality: N/A;  . Transurethral resection of prostate N/A 07/09/2014    Procedure: TRANSURETHRAL RESECTION of prostate WITH BUTTON ;  Surgeon: [name], MD;  Location[facility];  Service: Urology;  Laterality: N/A;    There were no vitals filed for this visit.  Visit Diagnosis: Aphasia as  late effect of stroke      Subjective Assessment - 07/29/14 1608    Subjective "I have trouble getting names out."   Patient is accompained by: Family member   Special Tests Portions of WAB   Currently in Pain? No/denies            SLP Evaluation [facility] - 07/29/14 1608    SLP Visit Information   SLP Received On 07/29/14   Onset Date April    Medical Diagnosis s/p CVA   Subjective   Subjective "I have trouble getting my words out."   General Information   Other Pertinent Information Henry [name] is a 80 yo male who was referred for  continued speech therapy in the outpatient setting due to functional deficits secondary to left MCA territory infarct with ICA occlusion status post mechanical thrombectomy with left ICA stenting, embolic secondary to large vessel atherosclerosis. Pt with aphasia and mild weakness. He presented on May 23, 2014, to [place] syncopal episodes, findings of aphasia as well as right-sided weakness.  The patient did not receive tPA.  Initial cranial CT scan showed no acute abnormalities.  CT angio of the head showed nonvisualization of the left trocar artery occluded proximally within the neck.  He was transferred to [facility] for ongoing care. CT cerebral perfusion scan showed areas of ischemic core infarct within the left MCA territory.  Echocardiogram with ejection fraction of 45% grade 1 diastolic dysfunction.  Venous Dopplers showed no signs of DVT. Carotid arteriogram completed by revascularization of acutely occluded left ICA proximal with stenting assisted angioplasty by Interventional Radiology.  The patient initially maintained on ventilator for a short time.  Followup MRI showed multiple areas of acute ischemic left middle cerebral artery territory infarct.    Behavioral/Cognition WNL   Mobility Status ambulatory   Prior Functional Status   Cognitive/Linguistic Baseline Within functional limits   Type of Home House    Lives With Spouse   Available Support Family   Vocation Retired   Associate Professor   Overall Cognitive Status Within Functional Limits for tasks assessed   Memory Appears intact   Awareness Appears intact   Problem Solving Appears intact   Auditory Comprehension   Overall Auditory Comprehension Appears within functional limits for tasks assessed   Yes/No Questions Within Functional Limits   Commands Within Functional Limits   Conversation Complex   Interfering Components Hearing   EffectiveTechniques Increased volume   Visual Recognition/Discrimination    Discrimination Within Function Limits   Expression   Primary Mode of Expression Verbal   Verbal Expression   Overall Verbal Expression Impaired   Initiation No impairment   Level of Generative/Spontaneous Verbalization Conversation   Repetition Impaired   Level of Impairment Sentence level   Naming Impairment   Responsive 76-100% accurate   Confrontation 75-100% accurate   Convergent 50-74% accurate   Divergent 50-74% accurate   Pragmatics No impairment   Effective Techniques Written cues;Phonemic cues   Non-Verbal Means of Communication Not applicable   Written Expression   Dominant Hand Right   Written Expression Exceptions to [facility]   Self Formulation Ability Word   Oral Motor/Sensory Function   Overall Oral Motor/Sensory Function Appears within functional limits for tasks assessed   Labial ROM Within Functional Limits   Labial Strength Within Functional Limits   Labial Sensation Within Functional Limits   Lingual ROM Within Functional Limits   Lingual Symmetry Within Functional Limits   Lingual Strength Within Functional Limits  Lingual Sensation Within Functional Limits   Facial ROM Within Functional Limits   Facial Symmetry Within Functional Limits   Facial Strength Within Functional Limits   Facial Sensation Within Functional Limits   Velum Within Functional Limits   Mandible Within Functional Limits   Motor Speech   Overall Motor Speech Appears within functional limits for tasks assessed   Respiration Within functional limits   Phonation Normal   Resonance Within functional limits   Articulation Within functional limitis   Intelligibility Intelligible   Motor Planning Witnin functional limits   Interfering Components Hearing loss   Phonation WFL   Standardized Assessments   Standardized Assessments  Other Assessment                SLP Short Term Goals - 08-05-14 1613    SLP SHORT TERM GOAL #1   Title Pt will complete complex word retrieval tasks  with 90% acc with min assist and use of strategies.   SLP SHORT TERM GOAL #2   Title Pt will provide verbal description of presented item by giving 3 descriptive characteristics with 90% acc when given min cues.   SLP SHORT TERM GOAL #3   Title Pt will implement word finding strategies during conversational speech with mild/mod cues.    SLP SHORT TERM GOAL #4    Pt will write personal/bio information and fill out checks         with 100% acc when provided min cues.      SLP Long Term Goals - 2014/08/05 1614    SLP LONG TERM GOAL #1   Title Pt will increase verbal expression skills to [facility] with use of strategies.   Status New          Plan - 08-05-2014 1613    Clinical Impression Statement Mr. Fullen presents with mild to mild/mod expressive aphasia characterized by deficits in confrontation and responsive naming, phonemic and semantic paraphasic errors, perseveration of verbal responses, and impaired written expression. Pt would benefit from skilled SLP therapy in outpatient setting to maximize expressive language skills, increase independence with communication, and improve quality of life. Pt is highly motivated and has excellent family support.    Speech Therapy Frequency 2x / week   Duration --  4-8 weeks   Potential to Achieve Goals Good   Consulted and Agree with Plan of Care Patient          G-Codes - August 05, 2014 1614    Functional Assessment Tool Used clinical judgement   Functional Limitations Spoken language expressive   Spoken Language Expression Current Status [phone]) At least 20 percent but less than 40 percent impaired, limited or restricted   Spoken Language Expression Goal Status ([id number]) At least 1 percent but less than 20 percent impaired, limited or restricted      Problem List Patient Active Problem List   Diagnosis Date Noted  . BPH (benign prostatic hypertrophy) with urinary obstruction 07/09/2014  . Right leg swelling 07/06/2014  . UTI (urinary tract  infection) 07/06/2014  . ARF (acute renal failure) as a complication of the indwelling Foley 07/05/2014  . Cerebral infarction due to embolism of left middle cerebral artery 06/29/2014  . Carotid stenosis 06/29/2014  . Urinary retention 06/29/2014  . Hematuria 06/29/2014  . Aphasia S/P CVA 05/28/2014  . Dysphagia, pharyngoesophageal phase 05/28/2014  . Left middle cerebral artery stroke 05/26/2014  . Coarctation of aorta, recurrent, post-intervention   . Expressive aphasia   . Acute right-sided weakness   .  Stroke 05/23/2014  . S/P shoulder surgery 12/24/2012  . S/P arthroscopy of shoulder 12/16/2012  . Shoulder arthritis 12/13/2012  . Biceps tendon rupture, proximal 12/13/2012  . Partial tear of right subscapularis tendon 12/13/2012  . Hypertension 05/10/2011  . Hyperlipidemia 05/10/2011  . Barrett esophagus 05/10/2011   Thank you,  Genene Churn, Bernice  [facility] 07/29/2014, 4:15 PM  [place] [address] [place], [place], [address] Phone: [phone]   Fax:  [phone]

## 2014-08-05 ENCOUNTER — Ambulatory Visit (HOSPITAL_COMMUNITY): Payer: Medicare Other | Admitting: Speech Pathology

## 2014-08-05 DIAGNOSIS — I69320 Aphasia following cerebral infarction: Secondary | ICD-10-CM | POA: Diagnosis not present

## 2014-08-06 ENCOUNTER — Ambulatory Visit (HOSPITAL_COMMUNITY): Payer: Medicare Other | Admitting: Speech Pathology

## 2014-08-06 DIAGNOSIS — I6789 Other cerebrovascular disease: Secondary | ICD-10-CM | POA: Diagnosis not present

## 2014-08-06 DIAGNOSIS — I69320 Aphasia following cerebral infarction: Secondary | ICD-10-CM

## 2014-08-06 NOTE — Therapy (Signed)
[place], [place], [address] Phone: [phone]   Fax:  [phone]  Speech Language Pathology Treatment  Patient Details  Name: Henry [name] MRN: [id number] Date of Birth: 12/23/1934 Referring Provider:  Marjean Donna, MD  Encounter Date: 08/06/2014      End of Session - 08/06/14 1137    Visit Number 3   Number of Visits 16   Date for SLP Re-Evaluation 09/28/14   Authorization Type Medicare   SLP Start Time 8299   SLP Stop Time  1110   SLP Time Calculation (min) 55 min   Activity Tolerance Patient tolerated treatment well      Past Medical History  Diagnosis Date  . Hypertension   . GERD (gastroesophageal reflux disease)   . Hypercholesteremia   . HOH (hard of hearing)   . Stroke     had a stroke 05/23/2014  . Hx-TIA (transient ischemic attack)     hx of this- wife states 10 years ago-but Dr. Estil Daft told wife he had had several-has 2 stents in carotid artery-left  . FOOT SPRAIN, RIGHT 07/18/2007    Qualifier: Diagnosis of  By: Aline Brochure MD, [name]    . Esophagitis 05/10/2011  . Accelerated hypertension 06/29/2014  . Arthritis     Back  . Foley catheter in place     Past Surgical History  Procedure Laterality Date  . Shoulder surgery Right     torn cartiledge  . Esophagogastroduodenoscopy  10/28/2010    Procedure: ESOPHAGOGASTRODUODENOSCOPY (EGD);  Surgeon: [name], MD;  [facility];  Service: Endoscopy;  Laterality: N/A;  10:30  . Colonoscopy    . Colonoscopy  05/17/2011    Procedure: COLONOSCOPY;  Surgeon: [name], MD;  [facility];  Service: Endoscopy;  Laterality: N/A;  830  . Resection distal clavical Right 12/13/2012    Procedure: RESECTION DISTAL CLAVICAL;  Surgeon: Carole Civil, MD;  [facility];  Service: Orthopedics;  Laterality: Right;  . Shoulder arthroscopy with bicepstenotomy Right 12/13/2012    Procedure: SHOULDER ARTHROSCOPY  WITH BICEPS TENOTOMY, limited shoulder debridement;  Surgeon: Carole Civil, MD;  [facility];  Service: Orthopedics;  Laterality: Right;  . Esophagogastroduodenoscopy N/A 04/24/2014    Procedure: ESOPHAGOGASTRODUODENOSCOPY (EGD);  Surgeon: [name], MD;  [facility];  Service: Endoscopy;  Laterality: N/A;  815  . Radiology with anesthesia N/A 05/23/2014    Procedure: RADIOLOGY WITH ANESTHESIA;  Surgeon: Medication Radiologist, MD;  Location: [place];  Service: Radiology;  Laterality: N/A;  . Epidural steroid injection  05/22/2014    had this in lumbar and at 1 am on 05/23/2014 had a stroke  . Carotid stents  05/2014    2 stents in carotid artery on left side  . Shoulder surgery      right shoulder  . Eus N/A 06/26/2014    Procedure: FULL UPPER ENDOSCOPIC ULTRASOUND (EUS) RADIAL;  Surgeon: [name], MD;  [facility];  Service: Endoscopy;  Laterality: N/A;  . Esophagogastroduodenoscopy N/A 06/26/2014    Procedure: ESOPHAGOGASTRODUODENOSCOPY (EGD);  Surgeon: [name], MD;  Location: [facility];  Service: Endoscopy;  Laterality: N/A;  . Transurethral resection of prostate N/A 07/09/2014    Procedure: TRANSURETHRAL RESECTION of prostate WITH BUTTON ;  Surgeon: [name], MD;  [facility];  Service: Urology;  Laterality: N/A;    There were no vitals filed for this visit.  Visit Diagnosis: Aphasia as  late effect of stroke      Subjective Assessment - 08/06/14 1119    Subjective "I did my homework."   Currently in Pain? No/denies               ADULT SLP TREATMENT - 08/06/14 1119    General Information   Behavior/Cognition Alert;Cooperative;Pleasant mood   Patient Positioning Upright in chair   Oral care provided N/A   HPI Henry [name] is a 80 yo male who was referred for continued speech therapy in the outpatient setting due to functional deficits secondary to left MCA territory infarct with ICA occlusion status post mechanical  thrombectomy with left ICA stenting, embolic secondary to large vessel atherosclerosis. Pt with aphasia and mild weakness. He presented on May 23, 2014, to [place] syncopal episodes, findings of aphasia as well as right-sided weakness. The patient did not receive tPA. Initial cranial CT scan showed no acute abnormalities. CT angio of the head showed nonvisualization of the left trocar artery occluded proximally within the neck. He was transferred to [facility] for ongoing care. CT cerebral perfusion scan showed areas of ischemic core infarct within the left MCA territory. Echocardiogram with ejection fraction of 82% grade 1 diastolic dysfunction. Venous Dopplers showed no signs of DVT. Carotid arteriogram completed by revascularization of acutely occluded left ICA proximal with stenting assisted angioplasty by Interventional Radiology.The patient initially maintained on ventilator for a short time. Followup MRI showed multiple areas of acute ischemic left middle cerebral artery territory infarct.    Treatment Provided   Treatment provided Cognitive-Linquistic   Pain Assessment   Pain Assessment No/denies pain   Cognitive-Linquistic Treatment   Treatment focused on Aphasia;Patient/family/caregiver education   Skilled Treatment word finding strategies, circumlocution, confrontation naming   Assessment / Recommendations / Plan   Plan Continue with current plan of care          SLP Education - 08/06/14 1137    Education provided Yes   Education Details information regarding aphasia and dysarthria   Person(s) Educated Patient   Methods Explanation;Handout   Comprehension Verbalized understanding          SLP Short Term Goals - 08/06/14 1150    SLP SHORT TERM GOAL #1   Title Pt will complete complex word retrieval tasks with 90% acc with min assist and use of strategies.   Baseline 75% mild/mod assist   Time 4   Period Weeks   Status On-going   SLP SHORT  TERM GOAL #2   Title Pt will provide verbal description of presented item by giving 3 descriptive characteristics with 90% acc when given min cues   Baseline 80% mi/mod   Time 4   Period Weeks   Status On-going   SLP SHORT TERM GOAL #3   Title Pt will implement word finding strategies during conversational speech with mild/mod cues.   Baseline mod assist   Time 4   Period Weeks   Status On-going   SLP SHORT TERM GOAL #4   Title Pt will write personal/bio information and fill out checks with 100% acc when provided min cues.   Baseline 75%   Time 4   Period Weeks   Status On-going   SLP SHORT TERM GOAL #5   Title Pt will implement speech intelligibility strategies in structured sentence repetition and oral reading tasks to improve clarity with 90% acc as judged by clinician and min cues.    Baseline 75%   Time 4  Period Weeks   Status New          SLP Long Term Goals - 08/05/14 2359    SLP LONG TERM GOAL #1   Title Pt will increase verbal expression skills to [facility] with use of strategies.   Status New          Plan - 08/06/14 1138    Clinical Impression Statement Mr. Normington completed a page of homework requiring him to write/name to description. He completed with 100% acc for naming component and 90% acc for spelling. He reported that it was "fairly easy", but writing was difficult. Education completed on aphasia and dysarthria terminology as pt expressed interest in finding information on the computer at home. We watched the trailer for the Aphasia movie which sparked good conversation in how the stroke has affected him. He reports that his wife frequently asks him to speak up and SLP explained that this was due to dysarthria. His ability to self correct is impacted due to decreased (but evolving) awareness of slight changes in his speech. Once an area of concern has been identified by someone else (wife, therapist), he then makes it a point to address the problem area. Pt had  several episodes of word finding difficulties during conversation today, but was able to convey his meaning (and often retrieve the word) when allowed extra time and min cues. I had him read aloud minimal pairs ([employer] workbook) and he exhibited phonemic paraphasic errors on 25% of the words without attempt to self correct. Next session, provide additional information on aphasia and plan to audio record oral reading passages for feedback. Continue POC.    Speech Therapy Frequency 2x / week   Duration --  4-8 weeks   Treatment/Interventions Cueing hierarchy;SLP instruction and feedback;Multimodal communcation approach;Compensatory strategies;Patient/family education   Potential to Achieve Goals Good   SLP Home Exercise Plan Pt will be independent with HEP as assigned to facilitate carryover of treatment strategies in home/community environment   Consulted and Agree with Plan of Care Patient        Problem List Patient Active Problem List   Diagnosis Date Noted  . BPH (benign prostatic hypertrophy) with urinary obstruction 07/09/2014  . Right leg swelling 07/06/2014  . UTI (urinary tract infection) 07/06/2014  . ARF (acute renal failure) as a complication of the indwelling Foley 07/05/2014  . Cerebral infarction due to embolism of left middle cerebral artery 06/29/2014  . Carotid stenosis 06/29/2014  . Urinary retention 06/29/2014  . Hematuria 06/29/2014  . Aphasia S/P CVA 05/28/2014  . Dysphagia, pharyngoesophageal phase 05/28/2014  . Left middle cerebral artery stroke 05/26/2014  . Coarctation of aorta, recurrent, post-intervention   . Expressive aphasia   . Acute right-sided weakness   . Stroke 05/23/2014  . S/P shoulder surgery 12/24/2012  . S/P arthroscopy of shoulder 12/16/2012  . Shoulder arthritis 12/13/2012  . Biceps tendon rupture, proximal 12/13/2012  . Partial tear of right subscapularis tendon 12/13/2012  . Hypertension 05/10/2011  . Hyperlipidemia 05/10/2011   . Barrett esophagus 05/10/2011   Thank you,  Genene Churn, [place]   [facility] 08/06/2014, 11:53 AM  [place], [place], [address] Phone: [phone]   Fax:  [phone]

## 2014-08-06 NOTE — Therapy (Signed)
[place], [place], [address] Phone: [phone]   Fax:  [phone]  Speech Language Pathology Treatment  Patient Details  Name: Henry [name] MRN: [id number] Date of Birth: 07-30-1934 Referring Provider:  Marjean Donna, MD  Encounter Date: 08/05/2014      End of Session - 08/05/14 2359    Visit Number 2   Number of Visits 16   Date for SLP Re-Evaluation 09/28/14   Authorization Type Medicare   SLP Start Time 1430   SLP Stop Time  7619   SLP Time Calculation (min) 60 min   Activity Tolerance Patient tolerated treatment well      Past Medical History  Diagnosis Date  . Hypertension   . GERD (gastroesophageal reflux disease)   . Hypercholesteremia   . HOH (hard of hearing)   . Stroke     had a stroke 05/23/2014  . Hx-TIA (transient ischemic attack)     hx of this- wife states 10 years ago-but Dr. Estil Daft told wife he had had several-has 2 stents in carotid artery-left  . FOOT SPRAIN, RIGHT 07/18/2007    Qualifier: Diagnosis of  By: Aline Brochure MD, [name]    . Esophagitis 05/10/2011  . Accelerated hypertension 06/29/2014  . Arthritis     Back  . Foley catheter in place     Past Surgical History  Procedure Laterality Date  . Shoulder surgery Right     torn cartiledge  . Esophagogastroduodenoscopy  10/28/2010    Procedure: ESOPHAGOGASTRODUODENOSCOPY (EGD);  Surgeon: [name], MD;  [facility];  Service: Endoscopy;  Laterality: N/A;  10:30  . Colonoscopy    . Colonoscopy  05/17/2011    Procedure: COLONOSCOPY;  Surgeon: [name], MD;  [facility];  Service: Endoscopy;  Laterality: N/A;  830  . Resection distal clavical Right 12/13/2012    Procedure: RESECTION DISTAL CLAVICAL;  Surgeon: Carole Civil, MD;  Location: [facility];  Service: Orthopedics;  Laterality: Right;  . Shoulder arthroscopy with bicepstenotomy Right 12/13/2012    Procedure: SHOULDER ARTHROSCOPY  WITH BICEPS TENOTOMY, limited shoulder debridement;  Surgeon: Carole Civil, MD;  Location: [facility];  Service: Orthopedics;  Laterality: Right;  . Esophagogastroduodenoscopy N/A 04/24/2014    Procedure: ESOPHAGOGASTRODUODENOSCOPY (EGD);  Surgeon: [name], MD;  [facility];  Service: Endoscopy;  Laterality: N/A;  815  . Radiology with anesthesia N/A 05/23/2014    Procedure: RADIOLOGY WITH ANESTHESIA;  Surgeon: Medication Radiologist, MD;  Location: [facility];  Service: Radiology;  Laterality: N/A;  . Epidural steroid injection  05/22/2014    had this in lumbar and at 1 am on 05/23/2014 had a stroke  . Carotid stents  05/2014    2 stents in carotid artery on left side  . Shoulder surgery      right shoulder  . Eus N/A 06/26/2014    Procedure: FULL UPPER ENDOSCOPIC ULTRASOUND (EUS) RADIAL;  Surgeon: [name], MD;  [facility];  Service: Endoscopy;  Laterality: N/A;  . Esophagogastroduodenoscopy N/A 06/26/2014    Procedure: ESOPHAGOGASTRODUODENOSCOPY (EGD);  Surgeon: [name], MD;  Location: [facility];  Service: Endoscopy;  Laterality: N/A;  . Transurethral resection of prostate N/A 07/09/2014    Procedure: TRANSURETHRAL RESECTION of prostate WITH BUTTON ;  Surgeon: [name], MD;  Location: [facility];  Service: Urology;  Laterality: N/A;    There were no vitals filed for this visit.  Visit Diagnosis: Aphasia as  late effect of stroke      Subjective Assessment - 08/05/14 2356    Subjective "I am doing ok."   Currently in Pain? No/denies               ADULT SLP TREATMENT - 08/05/14 0001    General Information   Behavior/Cognition Alert;Cooperative;Pleasant mood   Patient Positioning Upright in chair   Oral care provided N/A   HPI 80 yo male s/p CVA with aphasia   Treatment Provided   Treatment provided Cognitive-Linquistic   Pain Assessment   Pain Assessment No/denies pain   Cognitive-Linquistic Treatment   Treatment focused on  Aphasia;Patient/family/caregiver education   Skilled Treatment word finding strategies, circumlocution, confrontation naming   Assessment / Recommendations / Plan   Plan Continue with current plan of care          SLP Education - 08/05/14 2358    Education provided Yes   Education Details Word finding strategies   Person(s) Educated Patient   Methods Explanation;Demonstration;Handout   Comprehension Verbalized understanding      SLP Short Term Goals - 08/05/14 1613    SLP SHORT TERM GOAL #1   Title Pt will complete complex word retrieval tasks with 90% acc with min assist and use of strategies.   SLP SHORT TERM GOAL #2   Title Pt will provide verbal description of presented item by giving 3 descriptive characteristics with 90% acc when given min cues.   SLP SHORT TERM GOAL #3   Title Pt will implement word finding strategies during conversational speech with mild/mod cues.    SLP SHORT TERM GOAL #4Pt will write personal/bio information and fill out checks with 100% acc when provided min cues.           SLP Long Term Goals - 08/05/14 2359    SLP LONG TERM GOAL #1   Title Pt will increase verbal expression skills to [facility] with use of strategies.   Status New          Plan - 08/05/14 2359    Clinical Impression Statement Henry [name] was seen for his first treatment session since his initial evaluation last week. He was provided with a folder for homework and handouts. Skilled SLP intervention focused on introduction of word finding strategies and were also provided in written format to take home. He required mild/mod cues to implement strategies in basic word finding tasks. He was also given a portion of the BNT and did very well naming high frequency pictures (bed, house, hammock), but had more difficulty with less  frequently encountered items (palatte, tripod, escalator). Errors were often related and typically with phonemic and semantic paraphasic errors. For example, camerapod/tripod, phoenix/sphinx, hydraponica/harmonica, and rhinopotamus/hipopotamus. He was given a naming to description paper for homework.   Speech Therapy Frequency 2x / week   Duration --  4-8 weeks   Treatment/Interventions Cueing hierarchy;SLP instruction and feedback;Compensatory strategies;Patient/family education   Potential to Achieve Goals Good   Consulted and Agree with Plan of Care Patient        Problem List Patient Active Problem List   Diagnosis Date Noted  . BPH (benign prostatic hypertrophy) with urinary obstruction 07/09/2014  . Right leg swelling 07/06/2014  . UTI (urinary tract infection) 07/06/2014  . ARF (acute renal failure) as a complication of the indwelling Foley 07/05/2014  . Cerebral infarction due to embolism of left middle cerebral artery 06/29/2014  . Carotid stenosis 06/29/2014  . Urinary retention 06/29/2014  . Hematuria 06/29/2014  .  Aphasia S/P CVA 05/28/2014  . Dysphagia, pharyngoesophageal phase 05/28/2014  . Left middle cerebral artery stroke 05/26/2014  . Coarctation of aorta, recurrent, post-intervention   . Expressive aphasia   . Acute right-sided weakness   . Stroke 05/23/2014  . S/P shoulder surgery 12/24/2012  . S/P arthroscopy of shoulder 12/16/2012  . Shoulder arthritis 12/13/2012  . Biceps tendon rupture, proximal 12/13/2012  . Partial tear of right subscapularis tendon 12/13/2012  . Hypertension 05/10/2011  . Hyperlipidemia 05/10/2011  . Barrett esophagus 05/10/2011   Thank you,  Genene Churn, [place]  [place] 08/06/2014, 12:00 AM  [place], [place], [address] Phone: [phone]   Fax:  [phone]

## 2014-08-10 ENCOUNTER — Ambulatory Visit (HOSPITAL_COMMUNITY): Payer: Medicare Other | Admitting: Speech Pathology

## 2014-08-10 ENCOUNTER — Other Ambulatory Visit: Payer: Self-pay

## 2014-08-10 DIAGNOSIS — I69320 Aphasia following cerebral infarction: Secondary | ICD-10-CM | POA: Diagnosis not present

## 2014-08-10 NOTE — Therapy (Signed)
[place], [place], [address] Phone: [phone]   Fax:  [phone]  Speech Language Pathology Treatment  Patient Details  Name: Henry [name] MRN: [id number] Date of Birth: 1934-02-24 Referring Provider:  Marjean Donna, MD  Encounter Date: 08/10/2014      End of Session - 08/10/14 1646    Visit Number 4   Number of Visits 16   Date for SLP Re-Evaluation 09/28/14   Authorization Type Medicare   SLP Start Time 1302   SLP Stop Time  7209   SLP Time Calculation (min) 47 min   Activity Tolerance Patient tolerated treatment well      Past Medical History  Diagnosis Date  . Hypertension   . GERD (gastroesophageal reflux disease)   . Hypercholesteremia   . HOH (hard of hearing)   . Stroke     had a stroke 05/23/2014  . Hx-TIA (transient ischemic attack)     hx of this- wife states 10 years ago-but Dr. Estil Daft told wife he had had several-has 2 stents in carotid artery-left  . FOOT SPRAIN, RIGHT 07/18/2007    Qualifier: Diagnosis of  By: Aline Brochure MD, [name]    . Esophagitis 05/10/2011  . Accelerated hypertension 06/29/2014  . Arthritis     Back  . Foley catheter in place     Past Surgical History  Procedure Laterality Date  . Shoulder surgery Right     torn cartiledge  . Esophagogastroduodenoscopy  10/28/2010    Procedure: ESOPHAGOGASTRODUODENOSCOPY (EGD);  Surgeon: [name], MD;  [facility];  Service: Endoscopy;  Laterality: N/A;  10:30  . Colonoscopy    . Colonoscopy  05/17/2011    Procedure: COLONOSCOPY;  Surgeon: [name], MD;  [facility];  Service: Endoscopy;  Laterality: N/A;  830  . Resection distal clavical Right 12/13/2012    Procedure: RESECTION DISTAL CLAVICAL;  Surgeon: Carole Civil, MD;  [facility];  Service: Orthopedics;  Laterality: Right;  . Shoulder arthroscopy with bicepstenotomy Right 12/13/2012    Procedure: SHOULDER ARTHROSCOPY  WITH BICEPS TENOTOMY, limited shoulder debridement;  Surgeon: Carole Civil, MD;  [facility];  Service: Orthopedics;  Laterality: Right;  . Esophagogastroduodenoscopy N/A 04/24/2014    Procedure: ESOPHAGOGASTRODUODENOSCOPY (EGD);  Surgeon: [name], MD;  [facility];  Service: Endoscopy;  Laterality: N/A;  815  . Radiology with anesthesia N/A 05/23/2014    Procedure: RADIOLOGY WITH ANESTHESIA;  Surgeon: Medication Radiologist, MD;  Location: [facility];  Service: Radiology;  Laterality: N/A;  . Epidural steroid injection  05/22/2014    had this in lumbar and at 1 am on 05/23/2014 had a stroke  . Carotid stents  05/2014    2 stents in carotid artery on left side  . Shoulder surgery      right shoulder  . Eus N/A 06/26/2014    Procedure: FULL UPPER ENDOSCOPIC ULTRASOUND (EUS) RADIAL;  Surgeon: [name], MD;  [facility];  Service: Endoscopy;  Laterality: N/A;  . Esophagogastroduodenoscopy N/A 06/26/2014    Procedure: ESOPHAGOGASTRODUODENOSCOPY (EGD);  Surgeon: [name], MD;  Location: [facility];  Service: Endoscopy;  Laterality: N/A;  . Transurethral resection of prostate N/A 07/09/2014    Procedure: TRANSURETHRAL RESECTION of prostate WITH BUTTON ;  Surgeon: [name], MD;  [facility];  Service: Urology;  Laterality: N/A;    There were no vitals filed for this visit.  Visit Diagnosis: Aphasia as  late effect of stroke      Subjective Assessment - 08/10/14 1644    Subjective Pt doing well, he was unable to come on Wednesday so came by to see if he could be seen today instead   Currently in Pain? No/denies               ADULT SLP TREATMENT - 08/10/14 0001    General Information   Behavior/Cognition Alert;Cooperative;Pleasant mood   Patient Positioning Upright in chair   Oral care provided N/A   HPI Henry [name] is a 80 yo male who was referred for continued speech therapy in the outpatient setting due to functional deficits  secondary to left MCA territory infarct with ICA occlusion status post mechanical thrombectomy with left ICA stenting, embolic secondary to large vessel atherosclerosis. Pt with aphasia and mild weakness. He presented on May 23, 2014, to [facility] syncopal episodes, findings of aphasia as well as right-sided weakness. The patient did not receive tPA. Initial cranial CT scan showed no acute abnormalities. CT angio of the head showed nonvisualization of the left trocar artery occluded proximally within the neck. He was transferred to [facility] for ongoing care. CT cerebral perfusion scan showed areas of ischemic core infarct within the left MCA territory. Echocardiogram with ejection fraction of 44% grade 1 diastolic dysfunction. Venous Dopplers showed no signs of DVT. Carotid arteriogram completed by revascularization of acutely occluded left ICA proximal with stenting assisted angioplasty by Interventional Radiology.The patient initially maintained on ventilator for a short time. Followup MRI showed multiple areas of acute ischemic left middle cerebral artery territory infarct.    Treatment Provided   Treatment provided Cognitive-Linquistic   Pain Assessment   Pain Assessment No/denies pain   Cognitive-Linquistic Treatment   Treatment focused on Aphasia;Patient/family/caregiver education   Skilled Treatment word finding strategies, written expression, naming family and friends   Assessment / Recommendations / Plan   Plan Continue with current plan of care            SLP Short Term Goals - 08/10/14 1655    SLP SHORT TERM GOAL #1   Title Pt will complete complex word retrieval tasks with 90% acc with min assist and use of strategies.   Baseline 75% mild/mod assist   Time 4   Period Weeks   Status On-going   SLP SHORT TERM GOAL #2   Title Pt will provide verbal description of presented item by giving 3 descriptive characteristics with 90% acc when given min  cues   Baseline 80% mi/mod   Time 4   Period Weeks   Status On-going   SLP SHORT TERM GOAL #3   Title Pt will implement word finding strategies during conversational speech with mild/mod cues.   Baseline mod assist   Time 4   Period Weeks   Status On-going   SLP SHORT TERM GOAL #4   Title Pt will write personal/bio information and fill out checks with 100% acc when provided min cues.   Baseline 75%   Time 4   Period Weeks   Status On-going   SLP SHORT TERM GOAL #5   Title Pt will implement speech intelligibility strategies in structured sentence repetition and oral reading tasks to improve clarity with 90% acc as judged by clinician and min cues.    Baseline 75%   Time 4   Period Weeks   Status On-going          SLP Long Term Goals -  08/10/14 [place] #1   Title Pt will increase verbal expression skills to [facility] with use of strategies.   Status On-going          Plan - 08/10/14 1647    Clinical Impression Statement Henry [name] completed his homework, but was unable to bring it in because it ended up in the car with his wife. Session focused on increasing written expression for personal information (name, address, phone #, and email address). Writing was ~80% legible and about 75% complete. He benefitted from written model and expressed frustration for needing it. He was given a small index card to carry in his wallet to use as a model in case he needs it. Word finding difficulties were noted when discussing his family. He was able to name his wife, dog, two daughters, two sons and eventually 2 of 3 grandchildren. He again expressed frustration with this. SLP assured him that this was a language problem and not because he "didn't remember" them. For homework, he was given a writing page for personal/bio info to complete with model attached. Continue POC. Next session ask for names of grandaughters.   Speech Therapy Frequency 2x / week   Duration --  4-8 weeks    Treatment/Interventions Cueing hierarchy;SLP instruction and feedback;Multimodal communcation approach;Compensatory strategies;Patient/family education   Potential to Achieve Goals Good   SLP Home Exercise Plan Pt will be independent with HEP as assigned to facilitate carryover of treatment strategies in home/community environment   Consulted and Agree with Plan of Care Patient        Problem List Patient Active Problem List   Diagnosis Date Noted  . BPH (benign prostatic hypertrophy) with urinary obstruction 07/09/2014  . Right leg swelling 07/06/2014  . UTI (urinary tract infection) 07/06/2014  . ARF (acute renal failure) as a complication of the indwelling Foley 07/05/2014  . Cerebral infarction due to embolism of left middle cerebral artery 06/29/2014  . Carotid stenosis 06/29/2014  . Urinary retention 06/29/2014  . Hematuria 06/29/2014  . Aphasia S/P CVA 05/28/2014  . Dysphagia, pharyngoesophageal phase 05/28/2014  . Left middle cerebral artery stroke 05/26/2014  . Coarctation of aorta, recurrent, post-intervention   . Expressive aphasia   . Acute right-sided weakness   . Stroke 05/23/2014  . S/P shoulder surgery 12/24/2012  . S/P arthroscopy of shoulder 12/16/2012  . Shoulder arthritis 12/13/2012  . Biceps tendon rupture, proximal 12/13/2012  . Partial tear of right subscapularis tendon 12/13/2012  . Hypertension 05/10/2011  . Hyperlipidemia 05/10/2011  . Barrett esophagus 05/10/2011   Thank you,  Genene Churn, [place]  [facility] 08/10/2014, 4:56 PM  [place] [address] [place], [place], [address] Phone: [phone]   Fax:  [phone]

## 2014-08-12 ENCOUNTER — Encounter (HOSPITAL_COMMUNITY): Payer: Medicare Other | Admitting: Speech Pathology

## 2014-08-13 ENCOUNTER — Ambulatory Visit (HOSPITAL_COMMUNITY): Payer: Medicare Other | Attending: Family Medicine | Admitting: Speech Pathology

## 2014-08-13 DIAGNOSIS — I69320 Aphasia following cerebral infarction: Secondary | ICD-10-CM | POA: Diagnosis not present

## 2014-08-13 NOTE — Therapy (Signed)
[place], [place], [address] Phone: [phone]   Fax:  [phone]  Speech Language Pathology Treatment  Patient Details  Name: Henry [name] MRN: [id number] Date of Birth: Jun 17, 1934 Referring Provider:  Marjean Donna, MD  Encounter Date: 08/13/2014      End of Session - 08/13/14 1603    Visit Number 5   Number of Visits 16   Date for SLP Re-Evaluation 09/28/14   Authorization Type Medicare   SLP Start Time 1025   SLP Stop Time  1111   SLP Time Calculation (min) 46 min   Activity Tolerance Patient tolerated treatment well      Past Medical History  Diagnosis Date  . Hypertension   . GERD (gastroesophageal reflux disease)   . Hypercholesteremia   . HOH (hard of hearing)   . Stroke     had a stroke 05/23/2014  . Hx-TIA (transient ischemic attack)     hx of this- wife states 10 years ago-but Dr. Estil Daft told wife he had had several-has 2 stents in carotid artery-left  . FOOT SPRAIN, RIGHT 07/18/2007    Qualifier: Diagnosis of  By: Aline Brochure MD, [name]    . Esophagitis 05/10/2011  . Accelerated hypertension 06/29/2014  . Arthritis     Back  . Foley catheter in place     Past Surgical History  Procedure Laterality Date  . Shoulder surgery Right     torn cartiledge  . Esophagogastroduodenoscopy  10/28/2010    Procedure: ESOPHAGOGASTRODUODENOSCOPY (EGD);  Surgeon: [name], MD;  [facility];  Service: Endoscopy;  Laterality: N/A;  10:30  . Colonoscopy    . Colonoscopy  05/17/2011    Procedure: COLONOSCOPY;  Surgeon: [name], MD;  [facility];  Service: Endoscopy;  Laterality: N/A;  830  . Resection distal clavical Right 12/13/2012    Procedure: RESECTION DISTAL CLAVICAL;  Surgeon: Carole Civil, MD;  [facility]: [facility];  Service: Orthopedics;  Laterality: Right;  . Shoulder arthroscopy with bicepstenotomy Right 12/13/2012    Procedure: SHOULDER ARTHROSCOPY  WITH BICEPS TENOTOMY, limited shoulder debridement;  Surgeon: Carole Civil, MD;  Location: [facility];  Service: Orthopedics;  Laterality: Right;  . Esophagogastroduodenoscopy N/A 04/24/2014    Procedure: ESOPHAGOGASTRODUODENOSCOPY (EGD);  Surgeon: [name], MD;  [facility];  Service: Endoscopy;  Laterality: N/A;  815  . Radiology with anesthesia N/A 05/23/2014    Procedure: RADIOLOGY WITH ANESTHESIA;  Surgeon: Medication Radiologist, MD;  Location: [place];  Service: Radiology;  Laterality: N/A;  . Epidural steroid injection  05/22/2014    had this in lumbar and at 1 am on 05/23/2014 had a stroke  . Carotid stents  05/2014    2 stents in carotid artery on left side  . Shoulder surgery      right shoulder  . Eus N/A 06/26/2014    Procedure: FULL UPPER ENDOSCOPIC ULTRASOUND (EUS) RADIAL;  Surgeon: [name], MD;  [facility];  Service: Endoscopy;  Laterality: N/A;  . Esophagogastroduodenoscopy N/A 06/26/2014    Procedure: ESOPHAGOGASTRODUODENOSCOPY (EGD);  Surgeon: [name], MD;  Location: [facility];  Service: Endoscopy;  Laterality: N/A;  . Transurethral resection of prostate N/A 07/09/2014    Procedure: TRANSURETHRAL RESECTION of prostate WITH BUTTON ;  Surgeon: [name], MD;  Location[facility];  Service: Urology;  Laterality: N/A;    There were no vitals filed for this visit.  Visit Diagnosis: Aphasia as  late effect of stroke      Subjective Assessment - 08/13/14 1602    Subjective Doing well   Currently in Pain? No/denies               ADULT SLP TREATMENT - 08/13/14 1602    General Information   Behavior/Cognition Alert;Cooperative;Pleasant mood   Patient Positioning Upright in chair   Oral care provided N/A   HPI Henry [name] is a 80 yo male who was referred for continued speech therapy in the outpatient setting due to functional deficits secondary to left MCA territory infarct with ICA occlusion status post mechanical  thrombectomy with left ICA stenting, embolic secondary to large vessel atherosclerosis. Pt with aphasia and mild weakness. He presented on May 23, 2014, to [place] syncopal episodes, findings of aphasia as well as right-sided weakness. The patient did not receive tPA. Initial cranial CT scan showed no acute abnormalities. CT angio of the head showed nonvisualization of the left trocar artery occluded proximally within the neck. He was transferred to [facility] for ongoing care. CT cerebral perfusion scan showed areas of ischemic core infarct within the left MCA territory. Echocardiogram with ejection fraction of 20% grade 1 diastolic dysfunction. Venous Dopplers showed no signs of DVT. Carotid arteriogram completed by revascularization of acutely occluded left ICA proximal with stenting assisted angioplasty by Interventional Radiology.The patient initially maintained on ventilator for a short time. Followup MRI showed multiple areas of acute ischemic left middle cerebral artery territory infarct.    Treatment Provided   Treatment provided Cognitive-Linquistic   Pain Assessment   Pain Assessment No/denies pain   Cognitive-Linquistic Treatment   Treatment focused on Aphasia;Patient/family/caregiver education   Skilled Treatment word finding strategies, written expression, naming family and friends   Assessment / Recommendations / Plan   Plan Continue with current plan of care          SLP Education - 08/13/14 1603    Education provided Yes   Education Details Additional information regarding aphasia from [facility]   Person(s) Educated Patient   Methods Explanation;Handout   Comprehension Verbalized understanding          SLP Short Term Goals - 08/13/14 1609    SLP SHORT TERM GOAL #1   Title Pt will complete complex word retrieval tasks with 90% acc with min assist and use of strategies.   Baseline 75% mild/mod assist   Time 4   Period Weeks   Status  On-going   SLP SHORT TERM GOAL #2   Title Pt will provide verbal description of presented item by giving 3 descriptive characteristics with 90% acc when given min cues   Baseline 80% mi/mod   Time 4   Period Weeks   Status On-going   SLP SHORT TERM GOAL #3   Title Pt will implement word finding strategies during conversational speech with mild/mod cues.   Baseline mod assist   Time 4   Period Weeks   Status On-going   SLP SHORT TERM GOAL #4   Title Pt will write personal/bio information and fill out checks with 100% acc when provided min cues.   Baseline 75%   Time 4   Period Weeks   Status On-going   SLP SHORT TERM GOAL #5   Title Pt will implement speech intelligibility strategies in structured sentence repetition and oral reading tasks to improve clarity with 90% acc as judged by clinician and min cues.    Baseline 75%   Time 4  Period Weeks   Status On-going          SLP Long Term Goals - 08/13/14 1609    SLP LONG TERM GOAL #1   Title Pt will increase verbal expression skills to [facility] with use of strategies.   Status On-going          Plan - 08/13/14 1604    Clinical Impression Statement Mr. Mahany was seen in break room today due to power outage. He completed homework with 95% acc (writing personal/bio information and automatics). He stated that he was able to do the work without use of model provided. Session continued with focus on generating list of family members, friends, physicians, and restaurants. He was able to come up with 6 friends, all immediate family members (previously missing 2), and 8 restaurants (with mod cues). He occasionally gave incorrect information and was not aware until SLP wrote it down. He was encouraged to continue to implement word finding strategies when "stuck". Good session, homework assigned [facility]), continue with word-finding in open ended questions.   Speech Therapy Frequency 2x / week   Duration --  4-8 weeks    Treatment/Interventions Cueing hierarchy;SLP instruction and feedback;Multimodal communcation approach;Compensatory strategies;Patient/family education   Potential to Achieve Goals Good   SLP Home Exercise Plan Pt will be independent with HEP as assigned to facilitate carryover of treatment strategies in home/community environment   Consulted and Agree with Plan of Care Patient        Problem List Patient Active Problem List   Diagnosis Date Noted  . BPH (benign prostatic hypertrophy) with urinary obstruction 07/09/2014  . Right leg swelling 07/06/2014  . UTI (urinary tract infection) 07/06/2014  . ARF (acute renal failure) as a complication of the indwelling Foley 07/05/2014  . Cerebral infarction due to embolism of left middle cerebral artery 06/29/2014  . Carotid stenosis 06/29/2014  . Urinary retention 06/29/2014  . Hematuria 06/29/2014  . Aphasia S/P CVA 05/28/2014  . Dysphagia, pharyngoesophageal phase 05/28/2014  . Left middle cerebral artery stroke 05/26/2014  . Coarctation of aorta, recurrent, post-intervention   . Expressive aphasia   . Acute right-sided weakness   . Stroke 05/23/2014  . S/P shoulder surgery 12/24/2012  . S/P arthroscopy of shoulder 12/16/2012  . Shoulder arthritis 12/13/2012  . Biceps tendon rupture, proximal 12/13/2012  . Partial tear of right subscapularis tendon 12/13/2012  . Hypertension 05/10/2011  . Hyperlipidemia 05/10/2011  . Barrett esophagus 05/10/2011   Thank you,  Genene Churn, [place]  [facility] 08/13/2014, 4:10 PM  [place] [address] [place], [place], [address] Phone: [phone]   Fax:  [phone]

## 2014-08-18 ENCOUNTER — Ambulatory Visit (HOSPITAL_COMMUNITY): Payer: Medicare Other | Attending: Family Medicine | Admitting: Speech Pathology

## 2014-08-18 DIAGNOSIS — I69320 Aphasia following cerebral infarction: Secondary | ICD-10-CM | POA: Diagnosis not present

## 2014-08-18 NOTE — Therapy (Signed)
[place], [place], [address] Phone: [phone]   Fax:  [phone]  Speech Language Pathology Treatment  Patient Details  Name: Henry [name] MRN: [id number] Date of Birth: 12-07-1934 Referring Provider:  Marjean Donna, MD  Encounter Date: 08/18/2014      End of Session - 08/18/14 1156    Visit Number 6   Number of Visits 16   Date for SLP Re-Evaluation 09/28/14   Authorization Type Medicare   SLP Start Time 1015   SLP Stop Time  1105   SLP Time Calculation (min) 50 min   Activity Tolerance Patient tolerated treatment well      Past Medical History  Diagnosis Date  . Hypertension   . GERD (gastroesophageal reflux disease)   . Hypercholesteremia   . HOH (hard of hearing)   . Stroke     had a stroke 05/23/2014  . Hx-TIA (transient ischemic attack)     hx of this- wife states 10 years ago-but Dr. Estil Daft told wife he had had several-has 2 stents in carotid artery-left  . FOOT SPRAIN, RIGHT 07/18/2007    Qualifier: Diagnosis of  By: Aline Brochure MD, [name]    . Esophagitis 05/10/2011  . Accelerated hypertension 06/29/2014  . Arthritis     Back  . Foley catheter in place     Past Surgical History  Procedure Laterality Date  . Shoulder surgery Right     torn cartiledge  . Esophagogastroduodenoscopy  10/28/2010    Procedure: ESOPHAGOGASTRODUODENOSCOPY (EGD);  Surgeon: [name], MD;  [facility];  Service: Endoscopy;  Laterality: N/A;  10:30  . Colonoscopy    . Colonoscopy  05/17/2011    Procedure: COLONOSCOPY;  Surgeon: [name], MD;  [facility];  Service: Endoscopy;  Laterality: N/A;  830  . Resection distal clavical Right 12/13/2012    Procedure: RESECTION DISTAL CLAVICAL;  Surgeon: Carole Civil, MD;  [facility];  Service: Orthopedics;  Laterality: Right;  . Shoulder arthroscopy with bicepstenotomy Right 12/13/2012    Procedure: SHOULDER ARTHROSCOPY  WITH BICEPS TENOTOMY, limited shoulder debridement;  Surgeon: Carole Civil, MD;  [facility];  Service: Orthopedics;  Laterality: Right;  . Esophagogastroduodenoscopy N/A 04/24/2014    Procedure: ESOPHAGOGASTRODUODENOSCOPY (EGD);  Surgeon: [name], MD;  [facility];  Service: Endoscopy;  Laterality: N/A;  815  . Radiology with anesthesia N/A 05/23/2014    Procedure: RADIOLOGY WITH ANESTHESIA;  Surgeon: Medication Radiologist, MD;  Location: [place];  Service: Radiology;  Laterality: N/A;  . Epidural steroid injection  05/22/2014    had this in lumbar and at 1 am on 05/23/2014 had a stroke  . Carotid stents  05/2014    2 stents in carotid artery on left side  . Shoulder surgery      right shoulder  . Eus N/A 06/26/2014    Procedure: FULL UPPER ENDOSCOPIC ULTRASOUND (EUS) RADIAL;  Surgeon: [name], MD;  [facility];  Service: Endoscopy;  Laterality: N/A;  . Esophagogastroduodenoscopy N/A 06/26/2014    Procedure: ESOPHAGOGASTRODUODENOSCOPY (EGD);  Surgeon: [name], MD;  Location: [facility];  Service: Endoscopy;  Laterality: N/A;  . Transurethral resection of prostate N/A 07/09/2014    Procedure: TRANSURETHRAL RESECTION of prostate WITH BUTTON ;  Surgeon: [name], MD;  [facility];  Service: Urology;  Laterality: N/A;    There were no vitals filed for this visit.  Visit Diagnosis: Aphasia as  late effect of stroke      Subjective Assessment - 08/18/14 1155    Subjective "I had a busy weekend."   Currently in Pain? No/denies               ADULT SLP TREATMENT - 08/18/14 1155    General Information   Behavior/Cognition Alert;Cooperative;Pleasant mood   Patient Positioning Upright in chair   Oral care provided N/A   HPI Mr. Henry [name] is a 80 yo male who was referred for continued speech therapy in the outpatient setting due to functional deficits secondary to left MCA territory infarct with ICA occlusion status post  mechanical thrombectomy with left ICA stenting, embolic secondary to large vessel atherosclerosis. Pt with aphasia and mild weakness. He presented on May 23, 2014, to [place] syncopal episodes, findings of aphasia as well as right-sided weakness. The patient did not receive tPA. Initial cranial CT scan showed no acute abnormalities. CT angio of the head showed nonvisualization of the left trocar artery occluded proximally within the neck. He was transferred to [facility] for ongoing care. CT cerebral perfusion scan showed areas of ischemic core infarct within the left MCA territory. Echocardiogram with ejection fraction of 16% grade 1 diastolic dysfunction. Venous Dopplers showed no signs of DVT. Carotid arteriogram completed by revascularization of acutely occluded left ICA proximal with stenting assisted angioplasty by Interventional Radiology.The patient initially maintained on ventilator for a short time. Followup MRI showed multiple areas of acute ischemic left middle cerebral artery territory infarct.    Treatment Provided   Treatment provided Cognitive-Linquistic   Pain Assessment   Pain Assessment No/denies pain   Cognitive-Linquistic Treatment   Treatment focused on Aphasia;Patient/family/caregiver education   Skilled Treatment word finding strategies, written expression, naming family and friends   Assessment / Recommendations / Plan   Plan Continue with current plan of care            SLP Short Term Goals - 08/18/14 1723    SLP SHORT TERM GOAL #1   Title Pt will complete complex word retrieval tasks with 90% acc with min assist and use of strategies.   Baseline 75% mild/mod assist   Time 4   Period Weeks   Status On-going   SLP SHORT TERM GOAL #2   Title Pt will provide verbal description of presented item by giving 3 descriptive characteristics with 90% acc when given min cues   Baseline 80% mi/mod   Time 4   Period Weeks   Status On-going    SLP SHORT TERM GOAL #3   Title Pt will implement word finding strategies during conversational speech with mild/mod cues.   Baseline mod assist   Time 4   Period Weeks   Status On-going   SLP SHORT TERM GOAL #4   Title Pt will write personal/bio information and fill out checks with 100% acc when provided min cues.   Baseline 75%   Time 4   Period Weeks   Status On-going   SLP SHORT TERM GOAL #5   Title Pt will implement speech intelligibility strategies in structured sentence repetition and oral reading tasks to improve clarity with 90% acc as judged by clinician and min cues.    Baseline 75%   Time 4   Period Weeks   Status On-going          SLP Long Term Goals - 08/18/14 1723    SLP LONG TERM GOAL #1   Title Pt will increase verbal  expression skills to [facility] with use of strategies.   Status On-going          Plan - 08/18/14 1157    Clinical Impression Statement Skilled SLP intervention focused on utilizing word finding strategies in functional tasks. He was able to provide object descriptions (based on function, size, color...) with 100% acc when given mild/mod verbal cues. He complete naming tasks with 95% acc with min cues.    Speech Therapy Frequency 2x / week   Duration --  4-8 weeks   Treatment/Interventions Cueing hierarchy;SLP instruction and feedback;Multimodal communcation approach;Compensatory strategies;Patient/family education   Potential to Achieve Goals Good   SLP Home Exercise Plan Pt will be independent with HEP as assigned to facilitate carryover of treatment strategies in home/community environment   Consulted and Agree with Plan of Care Patient        Problem List Patient Active Problem List   Diagnosis Date Noted  . BPH (benign prostatic hypertrophy) with urinary obstruction 07/09/2014  . Right leg swelling 07/06/2014  . UTI (urinary tract infection) 07/06/2014  . ARF (acute renal failure) as a complication of the indwelling Foley 07/05/2014   . Cerebral infarction due to embolism of left middle cerebral artery 06/29/2014  . Carotid stenosis 06/29/2014  . Urinary retention 06/29/2014  . Hematuria 06/29/2014  . Aphasia S/P CVA 05/28/2014  . Dysphagia, pharyngoesophageal phase 05/28/2014  . Left middle cerebral artery stroke 05/26/2014  . Coarctation of aorta, recurrent, post-intervention   . Expressive aphasia   . Acute right-sided weakness   . Stroke 05/23/2014  . S/P shoulder surgery 12/24/2012  . S/P arthroscopy of shoulder 12/16/2012  . Shoulder arthritis 12/13/2012  . Biceps tendon rupture, proximal 12/13/2012  . Partial tear of right subscapularis tendon 12/13/2012  . Hypertension 05/10/2011  . Hyperlipidemia 05/10/2011  . Barrett esophagus 05/10/2011   Thank you,  Genene Churn, [place]  Genene Churn 08/18/2014, 5:25 PM  [place] [address] [place], [place], [address] Phone: [phone]   Fax:  [phone]

## 2014-08-20 ENCOUNTER — Ambulatory Visit (HOSPITAL_COMMUNITY): Payer: Medicare Other | Admitting: Speech Pathology

## 2014-08-20 DIAGNOSIS — I69320 Aphasia following cerebral infarction: Secondary | ICD-10-CM

## 2014-08-20 NOTE — Therapy (Signed)
[place], [place], [address] Phone: [phone]   Fax:  [phone]  Speech Language Pathology Treatment  Patient Details  Name: Henry [name] MRN: [id number] Date of Birth: May 25, 1934 Referring Provider:  Marjean Donna, MD  Encounter Date: 08/20/2014      End of Session - 08/20/14 1534    Visit Number 7   Number of Visits 16   Date for SLP Re-Evaluation 09/28/14   Authorization Type Medicare   Authorization Time Period before 10th visit   SLP Start Time 1022   SLP Stop Time  1105   SLP Time Calculation (min) 43 min   Activity Tolerance Patient tolerated treatment well      Past Medical History  Diagnosis Date  . Hypertension   . GERD (gastroesophageal reflux disease)   . Hypercholesteremia   . HOH (hard of hearing)   . Stroke     had a stroke 05/23/2014  . Hx-TIA (transient ischemic attack)     hx of this- wife states 10 years ago-but Dr. Estil Daft told wife he had had several-has 2 stents in carotid artery-left  . FOOT SPRAIN, RIGHT 07/18/2007    Qualifier: Diagnosis of  By: Aline Brochure MD, [name]    . Esophagitis 05/10/2011  . Accelerated hypertension 06/29/2014  . Arthritis     Back  . Foley catheter in place     Past Surgical History  Procedure Laterality Date  . Shoulder surgery Right     torn cartiledge  . Esophagogastroduodenoscopy  10/28/2010    Procedure: ESOPHAGOGASTRODUODENOSCOPY (EGD);  Surgeon: [name], MD;  [facility];  Service: Endoscopy;  Laterality: N/A;  10:30  . Colonoscopy    . Colonoscopy  05/17/2011    Procedure: COLONOSCOPY;  Surgeon: [name], MD;  [facility];  Service: Endoscopy;  Laterality: N/A;  830  . Resection distal clavical Right 12/13/2012    Procedure: RESECTION DISTAL CLAVICAL;  Surgeon: Carole Civil, MD;  Location: [facility];  Service: Orthopedics;  Laterality: Right;  . Shoulder arthroscopy with bicepstenotomy Right  12/13/2012    Procedure: SHOULDER ARTHROSCOPY WITH BICEPS TENOTOMY, limited shoulder debridement;  Surgeon: Carole Civil, MD;  Location: [facility];  Service: Orthopedics;  Laterality: Right;  . Esophagogastroduodenoscopy N/A 04/24/2014    Procedure: ESOPHAGOGASTRODUODENOSCOPY (EGD);  Surgeon: [name], MD;  [facility];  Service: Endoscopy;  Laterality: N/A;  815  . Radiology with anesthesia N/A 05/23/2014    Procedure: RADIOLOGY WITH ANESTHESIA;  Surgeon: Medication Radiologist, MD;  Location: [facility];  Service: Radiology;  Laterality: N/A;  . Epidural steroid injection  05/22/2014    had this in lumbar and at 1 am on 05/23/2014 had a stroke  . Carotid stents  05/2014    2 stents in carotid artery on left side  . Shoulder surgery      right shoulder  . Eus N/A 06/26/2014    Procedure: FULL UPPER ENDOSCOPIC ULTRASOUND (EUS) RADIAL;  Surgeon: [name], MD;  [facility];  Service: Endoscopy;  Laterality: N/A;  . Esophagogastroduodenoscopy N/A 06/26/2014    Procedure: ESOPHAGOGASTRODUODENOSCOPY (EGD);  Surgeon: [name], MD;  Location: [facility];  Service: Endoscopy;  Laterality: N/A;  . Transurethral resection of prostate N/A 07/09/2014    Procedure: TRANSURETHRAL RESECTION of prostate WITH BUTTON ;  Surgeon: [name], MD;  Location[facility];  Service: Urology;  Laterality: N/A;    There were no vitals filed  for this visit.  Visit Diagnosis: Aphasia as late effect of stroke      Subjective Assessment - 08/20/14 1534    Subjective Doing very well   Currently in Pain? No/denies               ADULT SLP TREATMENT - 08/20/14 1534    General Information   Behavior/Cognition Alert;Cooperative;Pleasant mood   Patient Positioning Upright in chair   Oral care provided N/A   HPI Mr. Henry [name] is a 80 yo male who was referred for continued speech therapy in the outpatient setting due to functional deficits secondary to left MCA territory infarct  with ICA occlusion status post mechanical thrombectomy with left ICA stenting, embolic secondary to large vessel atherosclerosis. Pt with aphasia and mild weakness. He presented on May 23, 2014, to [place] syncopal episodes, findings of aphasia as well as right-sided weakness. The patient did not receive tPA. Initial cranial CT scan showed no acute abnormalities. CT angio of the head showed nonvisualization of the left trocar artery occluded proximally within the neck. He was transferred to [facility] for ongoing care. CT cerebral perfusion scan showed areas of ischemic core infarct within the left MCA territory. Echocardiogram with ejection fraction of 95% grade 1 diastolic dysfunction. Venous Dopplers showed no signs of DVT. Carotid arteriogram completed by revascularization of acutely occluded left ICA proximal with stenting assisted angioplasty by Interventional Radiology.The patient initially maintained on ventilator for a short time. Followup MRI showed multiple areas of acute ischemic left middle cerebral artery territory infarct.    Treatment Provided   Treatment provided Cognitive-Linquistic   Pain Assessment   Pain Assessment No/denies pain   Cognitive-Linquistic Treatment   Treatment focused on Aphasia;Patient/family/caregiver education   Skilled Treatment word finding strategies, written expression, naming family and friends   Assessment / Recommendations / Plan   Plan Continue with current plan of care            SLP Short Term Goals - 08/20/14 1535    SLP SHORT TERM GOAL #1   Title Pt will complete complex word retrieval tasks with 90% acc with min assist and use of strategies.   Baseline 75% mild/mod assist   Time 4   Period Weeks   Status On-going   SLP SHORT TERM GOAL #2   Title Pt will provide verbal description of presented item by giving 3 descriptive characteristics with 90% acc when given min cues   Baseline 80% mi/mod   Time 4    Period Weeks   Status On-going   SLP SHORT TERM GOAL #3   Title Pt will implement word finding strategies during conversational speech with mild/mod cues.   Baseline mod assist   Time 4   Period Weeks   Status On-going   SLP SHORT TERM GOAL #4   Title Pt will write personal/bio information and fill out checks with 100% acc when provided min cues.   Baseline 75%   Time 4   Period Weeks   Status On-going   SLP SHORT TERM GOAL #5   Title Pt will implement speech intelligibility strategies in structured sentence repetition and oral reading tasks to improve clarity with 90% acc as judged by clinician and min cues.    Baseline 75%   Time 4   Period Weeks   Status On-going          SLP Long Term Goals - 08/20/14 1535    SLP LONG TERM GOAL #1  Title Pt will increase verbal expression skills to [facility] with use of strategies.   Status On-going          Plan - 08/20/14 1535    Clinical Impression Statement Skilled SLP intervention focused on both convergent and divergent naming. Pt easily able to complete convergent naming tasks with 95% acc, but has difficulty in generating more than 3-5 items for divergent naming tasks. Pt encouraged to utilized visualization and organizational strategies to facilitate word generation. This helped tremendously, but he still required the verbal prompt for each ("think of a zoo, farm, jungle" "walk through the grocery story in your mind"). For example, he thought of 5 animals and stated "that's all I can do", but after use of strategies and cue, he came up with 23 animals. Pt given reading comprehension task for homework. Continue POC. He sees Dr. Erlinda Hong next week and is hopeful that he can start PT soon.    Speech Therapy Frequency 2x / week   Duration --  4-8 weeks   Treatment/Interventions Cueing hierarchy;SLP instruction and feedback;Multimodal communcation approach;Compensatory strategies;Patient/family education   Potential to Achieve Goals Good   SLP  Home Exercise Plan Pt will be independent with HEP as assigned to facilitate carryover of treatment strategies in home/community environment   Consulted and Agree with Plan of Care Patient        Problem List Patient Active Problem List   Diagnosis Date Noted  . BPH (benign prostatic hypertrophy) with urinary obstruction 07/09/2014  . Right leg swelling 07/06/2014  . UTI (urinary tract infection) 07/06/2014  . ARF (acute renal failure) as a complication of the indwelling Foley 07/05/2014  . Cerebral infarction due to embolism of left middle cerebral artery 06/29/2014  . Carotid stenosis 06/29/2014  . Urinary retention 06/29/2014  . Hematuria 06/29/2014  . Aphasia S/P CVA 05/28/2014  . Dysphagia, pharyngoesophageal phase 05/28/2014  . Left middle cerebral artery stroke 05/26/2014  . Coarctation of aorta, recurrent, post-intervention   . Expressive aphasia   . Acute right-sided weakness   . Stroke 05/23/2014  . S/P shoulder surgery 12/24/2012  . S/P arthroscopy of shoulder 12/16/2012  . Shoulder arthritis 12/13/2012  . Biceps tendon rupture, proximal 12/13/2012  . Partial tear of right subscapularis tendon 12/13/2012  . Hypertension 05/10/2011  . Hyperlipidemia 05/10/2011  . Barrett esophagus 05/10/2011   Thank you,  Genene Churn, [place]  [facility] 08/20/2014, 3:36 PM  [facility] [address] [place], [place], [address] Phone: [phone]   Fax:  [phone]

## 2014-08-24 ENCOUNTER — Ambulatory Visit (HOSPITAL_COMMUNITY): Payer: Medicare Other | Admitting: Speech Pathology

## 2014-08-24 DIAGNOSIS — I69320 Aphasia following cerebral infarction: Secondary | ICD-10-CM | POA: Diagnosis not present

## 2014-08-24 NOTE — Therapy (Signed)
[place], [place], [address] Phone: [phone]   Fax:  [phone]  Speech Language Pathology Treatment  Patient Details  Name: Henry [name] MRN: [id number] Date of Birth: 02-15-1934 Referring Provider:  Marjean Donna, MD  Encounter Date: 08/24/2014      End of Session - 08/24/14 1030    Visit Number 8   Number of Visits 16   Date for SLP Re-Evaluation 09/28/14   Authorization Type Medicare   Authorization Time Period before 10th visit   SLP Start Time 1015   SLP Stop Time  1100   SLP Time Calculation (min) 45 min   Activity Tolerance Patient tolerated treatment well      Past Medical History  Diagnosis Date  . Hypertension   . GERD (gastroesophageal reflux disease)   . Hypercholesteremia   . HOH (hard of hearing)   . Stroke     had a stroke 05/23/2014  . Hx-TIA (transient ischemic attack)     hx of this- wife states 10 years ago-but Dr. Estil Daft told wife he had had several-has 2 stents in carotid artery-left  . FOOT SPRAIN, RIGHT 07/18/2007    Qualifier: Diagnosis of  By: Aline Brochure MD, [name]    . Esophagitis 05/10/2011  . Accelerated hypertension 06/29/2014  . Arthritis     Back  . Foley catheter in place     Past Surgical History  Procedure Laterality Date  . Shoulder surgery Right     torn cartiledge  . Esophagogastroduodenoscopy  10/28/2010    Procedure: ESOPHAGOGASTRODUODENOSCOPY (EGD);  Surgeon: [name], MD;  [facility];  Service: Endoscopy;  Laterality: N/A;  10:30  . Colonoscopy    . Colonoscopy  05/17/2011    Procedure: COLONOSCOPY;  Surgeon: [name], MD;  [facility];  Service: Endoscopy;  Laterality: N/A;  830  . Resection distal clavical Right 12/13/2012    Procedure: RESECTION DISTAL CLAVICAL;  Surgeon: Carole Civil, MD;  [facility];  Service: Orthopedics;  Laterality: Right;  . Shoulder arthroscopy with bicepstenotomy Right  12/13/2012    Procedure: SHOULDER ARTHROSCOPY WITH BICEPS TENOTOMY, limited shoulder debridement;  Surgeon: Carole Civil, MD;  [facility];  Service: Orthopedics;  Laterality: Right;  . Esophagogastroduodenoscopy N/A 04/24/2014    Procedure: ESOPHAGOGASTRODUODENOSCOPY (EGD);  Surgeon: [name], MD;  [facility];  Service: Endoscopy;  Laterality: N/A;  815  . Radiology with anesthesia N/A 05/23/2014    Procedure: RADIOLOGY WITH ANESTHESIA;  Surgeon: Medication Radiologist, MD;  Location: [facility];  Service: Radiology;  Laterality: N/A;  . Epidural steroid injection  05/22/2014    had this in lumbar and at 1 am on 05/23/2014 had a stroke  . Carotid stents  05/2014    2 stents in carotid artery on left side  . Shoulder surgery      right shoulder  . Eus N/A 06/26/2014    Procedure: FULL UPPER ENDOSCOPIC ULTRASOUND (EUS) RADIAL;  Surgeon: [name], MD;  [facility];  Service: Endoscopy;  Laterality: N/A;  . Esophagogastroduodenoscopy N/A 06/26/2014    Procedure: ESOPHAGOGASTRODUODENOSCOPY (EGD);  Surgeon: [name], MD;  Location: [facility];  Service: Endoscopy;  Laterality: N/A;  . Transurethral resection of prostate N/A 07/09/2014    Procedure: TRANSURETHRAL RESECTION of prostate WITH BUTTON ;  Surgeon: [name], MD;  [facility];  Service: Urology;  Laterality: N/A;    There were no vitals filed  for this visit.  Visit Diagnosis: Aphasia as late effect of stroke      Subjective Assessment - 08/24/14 1025    Subjective I'm a day late and a dollar short on my homework."   Currently in Pain? No/denies               ADULT SLP TREATMENT - 08/24/14 1026    General Information   Behavior/Cognition Alert;Cooperative;Pleasant mood   Patient Positioning Upright in chair   Oral care provided N/A   HPI Henry [name] is a 80 yo male who was referred for continued speech therapy in the outpatient setting due to functional deficits  secondary to left MCA territory infarct with ICA occlusion status post mechanical thrombectomy with left ICA stenting, embolic secondary to large vessel atherosclerosis. Pt with aphasia and mild weakness. He presented on May 23, 2014, to [place] syncopal episodes, findings of aphasia as well as right-sided weakness. The patient did not receive tPA. Initial cranial CT scan showed no acute abnormalities. CT angio of the head showed nonvisualization of the left trocar artery occluded proximally within the neck. He was transferred to [facility] for ongoing care. CT cerebral perfusion scan showed areas of ischemic core infarct within the left MCA territory. Echocardiogram with ejection fraction of 16% grade 1 diastolic dysfunction. Venous Dopplers showed no signs of DVT. Carotid arteriogram completed by revascularization of acutely occluded left ICA proximal with stenting assisted angioplasty by Interventional Radiology.The patient initially maintained on ventilator for a short time. Followup MRI showed multiple areas of acute ischemic left middle cerebral artery territory infarct.    Treatment Provided   Treatment provided Cognitive-Linquistic   Pain Assessment   Pain Assessment No/denies pain   Cognitive-Linquistic Treatment   Treatment focused on Aphasia;Patient/family/caregiver education   Skilled Treatment word finding strategies, written expression, naming family and friends   Assessment / Recommendations / Plan   Plan Continue with current plan of care            SLP Short Term Goals - 08/24/14 1149    SLP SHORT TERM GOAL #1   Title Pt will complete complex word retrieval tasks with 90% acc with min assist and use of strategies.   Baseline 75% mild/mod assist   Time 4   Period Weeks   Status On-going   SLP SHORT TERM GOAL #2   Title Pt will provide verbal description of presented item by giving 3 descriptive characteristics with 90% acc when given min  cues   Baseline 80% mi/mod   Time 4   Period Weeks   Status On-going   SLP SHORT TERM GOAL #3   Title Pt will implement word finding strategies during conversational speech with mild/mod cues.   Baseline mod assist   Time 4   Period Weeks   Status On-going   SLP SHORT TERM GOAL #4   Title Pt will write personal/bio information and fill out checks with 100% acc when provided min cues.   Baseline 75%   Time 4   Period Weeks   Status On-going   SLP SHORT TERM GOAL #5   Title Pt will implement speech intelligibility strategies in structured sentence repetition and oral reading tasks to improve clarity with 90% acc as judged by clinician and min cues.    Baseline 75%   Time 4   Period Weeks   Status On-going          SLP Long Term Goals - 08/24/14 1149  SLP LONG TERM GOAL #1   Title Pt will increase verbal expression skills to [facility] with use of strategies.   Status On-going          Plan - 08/24/14 1139    Clinical Impression Statement Skilled SLP intervention focused on word finding and providing verbal descriptions during mildly complex verbal expression tasks. Mr. Zayas was able to generate 5 items in concrete categories with min assist. In verbal description tasks, he was able to explain mildly complex terms effectively with min cues. He utilized Psychologist, prison and probation services and gestural cues when he was unable to think of the word he desired. He had occasional word substitutions unknowingly, however when SLP repeated the word he stated "no, that's not right" and sought to replace with correct word. Progress was discussed today and he reports that he feels he is "doing better", but still has occasions when he cannot think of the word he wants to use. SLP explained that this will likely continue to be the case, but that occurrences should be less and he should be better able to compensate with strategies. Continue with POC.    Speech Therapy Frequency 2x / week   Duration --  4-8 weeks    Treatment/Interventions Cueing hierarchy;SLP instruction and feedback;Multimodal communcation approach;Compensatory strategies;Patient/family education   Potential to Achieve Goals Good   SLP Home Exercise Plan Pt will be independent with HEP as assigned to facilitate carryover of treatment strategies in home/community environment   Consulted and Agree with Plan of Care Patient        Problem List Patient Active Problem List   Diagnosis Date Noted  . BPH (benign prostatic hypertrophy) with urinary obstruction 07/09/2014  . Right leg swelling 07/06/2014  . UTI (urinary tract infection) 07/06/2014  . ARF (acute renal failure) as a complication of the indwelling Foley 07/05/2014  . Cerebral infarction due to embolism of left middle cerebral artery 06/29/2014  . Carotid stenosis 06/29/2014  . Urinary retention 06/29/2014  . Hematuria 06/29/2014  . Aphasia S/P CVA 05/28/2014  . Dysphagia, pharyngoesophageal phase 05/28/2014  . Left middle cerebral artery stroke 05/26/2014  . Coarctation of aorta, recurrent, post-intervention   . Expressive aphasia   . Acute right-sided weakness   . Stroke 05/23/2014  . S/P shoulder surgery 12/24/2012  . S/P arthroscopy of shoulder 12/16/2012  . Shoulder arthritis 12/13/2012  . Biceps tendon rupture, proximal 12/13/2012  . Partial tear of right subscapularis tendon 12/13/2012  . Hypertension 05/10/2011  . Hyperlipidemia 05/10/2011  . Barrett esophagus 05/10/2011   Thank you,  Genene Churn, [place]  [facility] 08/24/2014, 11:50 AM  [place], [place], [address] Phone: [phone]   Fax:  [phone]

## 2014-08-25 ENCOUNTER — Encounter (HOSPITAL_COMMUNITY): Payer: Medicare Other | Admitting: Speech Pathology

## 2014-08-27 ENCOUNTER — Ambulatory Visit (HOSPITAL_COMMUNITY): Payer: Medicare Other | Admitting: Speech Pathology

## 2014-08-27 DIAGNOSIS — I69320 Aphasia following cerebral infarction: Secondary | ICD-10-CM

## 2014-08-27 NOTE — Therapy (Signed)
[place], [place], [address] Phone: [phone]   Fax:  [phone]  Speech Language Pathology Treatment  Patient Details  Name: Henry [name] MRN: [id number] Date of Birth: [birth year] Referring Provider:  Marjean Donna, MD  Encounter Date: 08/27/2014      End of Session - 08/27/14 1751    Visit Number 9   Number of Visits 16   Date for SLP Re-Evaluation 09/28/14   Authorization Type Medicare   Authorization Time Period before 10th visit   SLP Start Time 31   SLP Stop Time  1105   SLP Time Calculation (min) 46 min   Activity Tolerance Patient tolerated treatment well      Past Medical History  Diagnosis Date  . Hypertension   . GERD (gastroesophageal reflux disease)   . Hypercholesteremia   . HOH (hard of hearing)   . Stroke     had a stroke 05/23/2014  . Hx-TIA (transient ischemic attack)     hx of this- wife states 10 years ago-but Dr. Estil Daft told wife he had had several-has 2 stents in carotid artery-left  . FOOT SPRAIN, RIGHT 07/18/2007    Qualifier: Diagnosis of  By: Aline Brochure MD, [name]    . Esophagitis 05/10/2011  . Accelerated hypertension 06/29/2014  . Arthritis     Back  . Foley catheter in place     Past Surgical History  Procedure Laterality Date  . Shoulder surgery Right     torn cartiledge  . Esophagogastroduodenoscopy  10/28/2010    Procedure: ESOPHAGOGASTRODUODENOSCOPY (EGD);  Surgeon: [name], MD;  [facility];  Service: Endoscopy;  Laterality: N/A;  10:30  . Colonoscopy    . Colonoscopy  05/17/2011    Procedure: COLONOSCOPY;  Surgeon: [name], MD;  [facility];  Service: Endoscopy;  Laterality: N/A;  830  . Resection distal clavical Right 12/13/2012    Procedure: RESECTION DISTAL CLAVICAL;  Surgeon: Carole Civil, MD;  Location: [facility];  Service: Orthopedics;  Laterality: Right;  . Shoulder arthroscopy with bicepstenotomy Right  12/13/2012    Procedure: SHOULDER ARTHROSCOPY WITH BICEPS TENOTOMY, limited shoulder debridement;  Surgeon: Carole Civil, MD;  Location: [facility];  Service: Orthopedics;  Laterality: Right;  . Esophagogastroduodenoscopy N/A 04/24/2014    Procedure: ESOPHAGOGASTRODUODENOSCOPY (EGD);  Surgeon: [name], MD;  [facility];  Service: Endoscopy;  Laterality: N/A;  815  . Radiology with anesthesia N/A 05/23/2014    Procedure: RADIOLOGY WITH ANESTHESIA;  Surgeon: Medication Radiologist, MD;  Location: [facility];  Service: Radiology;  Laterality: N/A;  . Epidural steroid injection  05/22/2014    had this in lumbar and at 1 am on 05/23/2014 had a stroke  . Carotid stents  05/2014    2 stents in carotid artery on left side  . Shoulder surgery      right shoulder  . Eus N/A 06/26/2014    Procedure: FULL UPPER ENDOSCOPIC ULTRASOUND (EUS) RADIAL;  Surgeon: [name], MD;  Location: [facility];  Service: Endoscopy;  Laterality: N/A;  . Esophagogastroduodenoscopy N/A 06/26/2014    Procedure: ESOPHAGOGASTRODUODENOSCOPY (EGD);  Surgeon: [name], MD;  Location: [facility];  Service: Endoscopy;  Laterality: N/A;  . Transurethral resection of prostate N/A 07/09/2014    Procedure: TRANSURETHRAL RESECTION of prostate WITH BUTTON ;  Surgeon: [name], MD;  Location: [facility];  Service: Urology;  Laterality: N/A;    There were no vitals filed  for this visit.  Visit Diagnosis: Aphasia as late effect of stroke      Subjective Assessment - 08/27/14 1750    Subjective I am happy with how I am doing, but I want to work on my balance.   Currently in Pain? No/denies               ADULT SLP TREATMENT - 08/27/14 1750    General Information   Behavior/Cognition Alert;Cooperative;Pleasant mood   Patient Positioning Upright in chair   Oral care provided N/A   HPI Mr. Henry [name] is a 80 yo male who was referred for continued speech therapy in the outpatient setting due to functional  deficits secondary to left MCA territory infarct with ICA occlusion status post mechanical thrombectomy with left ICA stenting, embolic secondary to large vessel atherosclerosis. Pt with aphasia and mild weakness. He presented on May 23, 2014, to [facility] syncopal episodes, findings of aphasia as well as right-sided weakness. The patient did not receive tPA. Initial cranial CT scan showed no acute abnormalities. CT angio of the head showed nonvisualization of the left trocar artery occluded proximally within the neck. He was transferred to [facility] for ongoing care. CT cerebral perfusion scan showed areas of ischemic core infarct within the left MCA territory. Echocardiogram with ejection fraction of 77% grade 1 diastolic dysfunction. Venous Dopplers showed no signs of DVT. Carotid arteriogram completed by revascularization of acutely occluded left ICA proximal with stenting assisted angioplasty by Interventional Radiology.The patient initially maintained on ventilator for a short time. Followup MRI showed multiple areas of acute ischemic left middle cerebral artery territory infarct.    Treatment Provided   Treatment provided Cognitive-Linquistic   Pain Assessment   Pain Assessment No/denies pain   Cognitive-Linquistic Treatment   Treatment focused on Aphasia;Patient/family/caregiver education   Skilled Treatment word finding strategies, written expression, naming family and friends   Assessment / Recommendations / Plan   Plan All goals met;Discharge SLP treatment due to (comment)          SLP Education - 08/27/14 1751    Education provided Yes   Education Details home program and discharge status   Person(s) Educated Patient   Methods Explanation;Handout   Comprehension Verbalized understanding          SLP Short Term Goals - 08/27/14 1753    SLP SHORT TERM GOAL #1   Title Pt will complete complex word retrieval tasks with 90% acc with min assist and  use of strategies.   Baseline 75% mild/mod assist   Time 4   Period Weeks   Status Achieved   SLP SHORT TERM GOAL #2   Title Pt will provide verbal description of presented item by giving 3 descriptive characteristics with 90% acc when given min cues   Baseline 80% mi/mod   Time 4   Period Weeks   Status Achieved   SLP SHORT TERM GOAL #3   Title Pt will implement word finding strategies during conversational speech with mild/mod cues.   Baseline mod assist   Time 4   Period Weeks   Status Achieved   SLP SHORT TERM GOAL #4   Title Pt will write personal/bio information and fill out checks with 100% acc when provided min cues.   Baseline 75%   Time 4   Period Weeks   Status Achieved   SLP SHORT TERM GOAL #5   Title Pt will implement speech intelligibility strategies in structured sentence repetition and oral reading  tasks to improve clarity with 90% acc as judged by clinician and min cues.    Baseline 75%   Time 4   Period Weeks   Status Achieved          SLP Long Term Goals - 09/18/14 1754    SLP LONG TERM GOAL #1   Title Pt will increase verbal expression skills to [facility] with use of strategies.   Status Achieved          Plan - September 18, 2014 1752    Clinical Impression Statement Mr. Henry [name] has attended 9 SLP sessions and is satisfied with his progress. He met 5 of 5 short term goals. In session, he was able to complete responsive proper naming tasks with 95% acc with min cues. He was able to tell me the names of family members independently, close friends (min cues), and his physicians (mild/mod cues) and knows to use his written reminder page if needed. He still has occasional word finding difficulties, however he attempts and is usually successful in using strategies to effectively communicate his intent. His writing has improved, however he is still encouraged to continue practice with this at home. He was given additional homework for his home program, however he would  benefit most from engaging in conversations with friends and family. He has not let the aphasia limit his interactions with friends and gathers with them nearly every day. He is hoping to get back to the golf course, but feels he needs to work on his balance a little bit before doing so. He will be discharged from speech therapy at this time, but I have put in a request for outpatient physical therapy and will send to his doctor. He has been a pleasure to work with and I wish him the best.    Speech Therapy Frequency --   Duration --   Treatment/Interventions Cueing hierarchy;SLP instruction and feedback;Multimodal communcation approach;Compensatory strategies;Patient/family education   Potential to Achieve Goals Good   SLP Home Exercise Plan Pt will be independent with HEP as assigned to facilitate carryover of treatment strategies in home/community environment   Consulted and Agree with Plan of Care Patient          G-Codes - Sep 18, 2014 1754    Functional Assessment Tool Used clinical judgement   Functional Limitations Spoken language expressive   Spoken Language Expression Goal Status (D3220) At least 1 percent but less than 20 percent impaired, limited or restricted   Spoken Language Expression Discharge Status [id number]) At least 1 percent but less than 20 percent impaired, limited or restricted      Problem List Patient Active Problem List   Diagnosis Date Noted  . BPH (benign prostatic hypertrophy) with urinary obstruction 07/09/2014  . Right leg swelling 07/06/2014  . UTI (urinary tract infection) 07/06/2014  . ARF (acute renal failure) as a complication of the indwelling Foley 07/05/2014  . Cerebral infarction due to embolism of left middle cerebral artery 06/29/2014  . Carotid stenosis 06/29/2014  . Urinary retention 06/29/2014  . Hematuria 06/29/2014  . Aphasia S/P CVA 05/28/2014  . Dysphagia, pharyngoesophageal phase 05/28/2014  . Left middle cerebral artery stroke 05/26/2014   . Coarctation of aorta, recurrent, post-intervention   . Expressive aphasia   . Acute right-sided weakness   . Stroke 05/23/2014  . S/P shoulder surgery 12/24/2012  . S/P arthroscopy of shoulder 12/16/2012  . Shoulder arthritis 12/13/2012  . Biceps tendon rupture, proximal 12/13/2012  . Partial tear of right subscapularis tendon 12/13/2012  .  Hypertension 05/10/2011  . Hyperlipidemia 05/10/2011  . Barrett esophagus 05/10/2011   SPEECH THERAPY DISCHARGE SUMMARY  Visits from Start of Care: 9  Current functional level related to goals / functional outcomes: Pt with mild expressive language deficits characterized by lingering difficulty with word finding in conversations; good implementation of strategies to compensate   Remaining deficits: Mild naming difficulties; see above  Education / Equipment: None  Plan: Patient agrees to discharge.  Patient goals were met. Patient is being discharged due to meeting the stated rehab goals.  ?????        Thank you,  Genene Churn, [place]  [facility] 08/27/2014, 6:04 PM  [place] [address] [place], [place], [address] Phone: [phone]   Fax:  [phone]

## 2014-08-28 ENCOUNTER — Encounter: Payer: Self-pay | Admitting: Neurology

## 2014-08-28 ENCOUNTER — Ambulatory Visit (INDEPENDENT_AMBULATORY_CARE_PROVIDER_SITE_OTHER): Payer: Medicare Other | Admitting: Neurology

## 2014-08-28 ENCOUNTER — Ambulatory Visit: Payer: Self-pay | Admitting: Neurology

## 2014-08-28 VITALS — BP 170/81 | HR 73 | Ht 68.5 in | Wt 209.0 lb

## 2014-08-28 DIAGNOSIS — I639 Cerebral infarction, unspecified: Secondary | ICD-10-CM | POA: Diagnosis not present

## 2014-08-28 DIAGNOSIS — I63412 Cerebral infarction due to embolism of left middle cerebral artery: Secondary | ICD-10-CM | POA: Diagnosis not present

## 2014-08-28 DIAGNOSIS — E785 Hyperlipidemia, unspecified: Secondary | ICD-10-CM

## 2014-08-28 DIAGNOSIS — I6523 Occlusion and stenosis of bilateral carotid arteries: Secondary | ICD-10-CM | POA: Diagnosis not present

## 2014-08-28 DIAGNOSIS — I10 Essential (primary) hypertension: Secondary | ICD-10-CM | POA: Insufficient documentation

## 2014-08-28 NOTE — Progress Notes (Signed)
STROKE NEUROLOGY FOLLOW UP NOTE  NAME: Henry [name] DOB: 17-Oct-1934  REASON FOR VISIT: stroke follow up HISTORY FROM: chart and pt and wife  Today we had the pleasure of seeing Henry [name] in follow-up at our Neurology Clinic. Pt was accompanied by wife.   History Summary Henry [name] is an 80 y.o. male hx of HTN, HLD presenting to [facility] with syncopal episode on 05/23/2014. Upon arrival found to be aphasic with right sided weakness. NIHSS = 15. Outside IV tPA window by time of arrival. A CTA showed left ICA proximal occlusion and the patient was transferred to [facility] for possible IR intervention. CT perfusion study showed large penumbra, he was taking to IR, and mechanical thrombectomy was performed. Left ICA and MCA recannulized, and left ICA stenting was performed. MRI showed left MCA territory infarct, and MRI showed wide patent left ICA and MCA. Carotid Doppler showed left ICA stent patent, and right ICA 50-60% stenosis. 2-D echo unremarkable. LDL 59 and A1c 6.2.  LV DVT negative. He was extubated on 05/25/2014 and his right hemiparesis improved nicely, however he still has severe dysarthria and aphasia. He passed swallow on discharge, was put on pured diet. Hwas put on ASA and Plavix for 3 months, and then Plavix monotherapy lifetime. He was discharged to CIR in good condition.  Follow up 06/29/14 - the patient has been doing well from a neurological standpoint. He was discharged from [facility] on 06/04/2014. His speech much improved without dysarthria or aphasia. However he developed hematuria and the urinary retention in rehabilitation and foley catheter was placed and urology consult was requested. He continued to follow up with urology after discharge and went to ER twice for urinary retention, so far still with foley catheter and was found to have UTI. He is going to start Abx and will [name] u[facility] next week for cystoscopy. He also had MRCP last week for questionable pancreas lesion  and was cleared by GI. For the procedures, he was currently only on ASA 81 and plavix on hold. Wife concerns that if he is on dural antiplatelet, he will have hematuria again. His BP was high today 188/88, but wife said at home he only at 140-160. Pt is somehow agitated by the foley catheter in the clinic today.  Interval History During the interval time, he was doing well without recurrent symptoms. He had cystoscopy and prostate procedure and hematuria resolved. He is still on dural antiplatelet now. He had repeat CUS in 07/2014 showed right ICA 50-69% and left ICA patent with stent. He has no complains. Finished speech therapy and going to have PT to help him "go back to golf course". BP today in clinic 170/81 but at home per wife is always 130s.   REVIEW OF SYSTEMS: Full 14 system review of systems performed and notable only for those listed below and in HPI above, all others are negative:  Constitutional:    Cardiovascular:  Ear/Nose/Throat:  Hearing loss Skin:  Eyes:   Respiratory:   Gastroitestinal:   Genitourinary:  urination problems  Hematology/Lymphatic:   anemia, easy bruising, easy bleeding  Endocrine:  Feeling cold Musculoskeletal:   Allergy/Immunology:   Neurological:   memory loss, weakness, slurry speech  Psychiatric: confusion Sleep:  snoring   The following represents the patient's updated allergies and side effects list: No Known Allergies  The neurologically relevant items on the patient's problem list were reviewed on today's visit.  Neurologic Examination  A problem focused neurological exam (  12 or more points of the single system neurologic examination, vital signs counts as 1 point, cranial nerves count for 8 points) was performed.  Blood pressure 170/81, pulse 73, height 5' 8.5" (1.74 m), weight 209 lb (94.802 kg).  General - Well nourished, well developed, in mild distress due to foley catheter.  Ophthalmologic - Fundi not visualized due to  incorporation  Cardiovascular - Regular rate and rhythm with no murmur.  Mental Status -  Level of arousal and orientation to time, place, and person were intact. Language including expression, naming, repetition, comprehension was assessed and found intact. No aphasia.  Cranial Nerves II - XII - II - Visual field intact OU. III, IV, VI - Extraocular movements intact. V - Facial sensation intact bilaterally. VII - Facial movement intact bilaterally. VIII - Hearing & vestibular intact bilaterally. X - Palate elevates symmetrically. XI - Chin turning & shoulder shrug intact bilaterally. XII - Tongue protrusion intact.  Motor Strength - The patient's strength was normal in all extremities and pronator drift was absent.  Bulk was normal and fasciculations were absent.   Motor Tone - Muscle tone was assessed at the neck and appendages and was normal.  Reflexes - The patient's reflexes were 1+ in all extremities and he had no pathological reflexes.  Sensory - Light touch, temperature/pinprick were were assessed and were normal.    Coordination - The patient had normal movements in the hands and feet with no ataxia or dysmetria.  Tremor was absent.  Gait and Station - The patient's transfers, posture, gait, station, and turns were observed as normal.  Data reviewed: I personally reviewed the images and agree with the radiology interpretations.  Ct Angio Head W/cm &/or Wo Cm  05/23/2014 IMPRESSION: 1. Nonvisualization of the left internal carotid artery, occluded proximally within the neck. There is distal reconstitution at the supraclinoid segment of the left ICA via contralateral flow across the circle of Willis. Flow within the left M1 segment is present but somewhat thready and irregular in appearance, which may in part be related to motion artifact on this exam. There is question of a superimposed focal moderate to severe stenosis and/or partially occlusive thrombus within the distal  left M1/ proximal M2 segment near the base of the sylvian fissure. CTA imaging of the neck is suggested to evaluate the extent of the left ICA occlusion. 2. Widely patent right internal carotid artery system. 3. Widely patent vertebrobasilar system. A small left posterior communicating artery is present.   Ct Head Wo Contrast  05/23/2014 IMPRESSION: Post angiography without evidence of intracranial hemorrhage or intracranial enhancing lesion. Indistinctness left subinsular region, left basal ganglia and portions of the left corona radiata/left frontal lobe may represent result of acute infarct. Prominent small vessel disease type changes.   05/23/2014 IMPRESSION: 1. No acute intracranial abnormality or significant interval change. 2. Stable atrophy and diffuse white matter disease. This likely reflects the sequela of chronic microvascular ischemia.   Ct Cerebral Perfusion W/cm  05/23/2014 IMPRESSION: 1. Area of ischemic core infarct within the left MCA territory, most prevalent within the anterior left frontal lobe. 2. Adjacent areas of elevated time to peak with relatively preserved CBV in the left MCA territory. Finding suspicious for ischemic penumbra. Size of ischemic penumbra overall approximately 1/3 the size of affected area.   Dg Chest Port 1 View  05/25/2014 IMPRESSION: Endotracheal tube similar to comparison, terminating approximately 2.9 cm above the carina. Unchanged gastric tube. Low lung volumes, with airspace and interstitial opacities  potentially reflecting atelectasis and/ or consolidation. Widened mediastinum, accentuated by the rotation of the patient. If there were concern for acute mediastinal or vascular abnormality, CT would be advised.   Dg Chest Port 1 View  05/23/2014 IMPRESSION: Central mild vascular congestion and mild perihilar increased bronchial markings. No convincing pulmonary edema. Mild basilar atelectasis without segmental infiltrate. Endotracheal tube  in place with tip 3.3 cm above the carina. NG tube in place.   Mri and Mra Head/brain Wo Cm  05/24/2014 IMPRESSION: MRI HEAD: Multi small areas of acute ischemia LEFT middle cerebral artery territory. Small vascular flow void, less likely micro hemorrhage LEFT posterior frontal lobe without lobar hematoma. Involutional changes. Moderate to severe white matter changes suggest chronic small vessel ischemic disease. Findings suggests of normal pressure hydrocephalus. MRA HEAD: Recannulized LEFT internal carotid artery. Normal MRA of the head, complete circle of Willis.   Carotid Doppler Right: ECA stenosis. 40-59% internal carotid artery stenosis by velocities. But ICA/CCA ratio of 3.07 suggests stenosis in the 60-79% range. Left: <50% stenosis in the stent. Bilateral: Vertebral artery flow is antegrade.  Carotid doppler 07/21/14 - right ICA 50-69% stenosis, left ICA s/p stent and patent.  2D Echocardiogram - Normal LV size with mild LV hypertrophy. EF 65-70%. Normal RV size and systolic function. No significant valvular abnormalities.  LE venous doppler - negative for DVT  Component     Latest Ref Rng 05/24/2014  Cholesterol     0 - 200 mg/dL 110  Triglycerides     <150 mg/dL 79  HDL Cholesterol     >39 mg/dL 35 (L)  Total CHOL/HDL Ratio      3.1  VLDL     0 - 40 mg/dL 16  LDL (calc)     0 - 99 mg/dL 59  Hemoglobin A1C     4.8 - 5.6 % 6.2 (H)  Mean Plasma Glucose      131    Assessment: As you may recall, he is a 80 y.o. Caucasian male with PMH of HTN, HLD was admitted on 05/23/14 for left MCA stroke due to left ICA and MCA tandem occlusion. IV tPA not given due to out of window but IR performed mechanical thrombectomy and achieved recannulization of left ICA and MCA. Left ICA stenting was done also. His language and right-sided hemiparesis recovered remarkably and the current examination showed no significant neuro deficit. He was put on aspirin and Plavix dural  antiplatelet and discharged to CIR. However, during the interval time, he developed hematuria, urinary retention, UTI, and was put on foley catheter. Had cystoscopy and prostate procedure and hematuria resolved. He also had MRCP for questionable pancreas lesion but was cleared by GI. Repeat carotid Doppler showed patent left ICA stent and right ICA 50-69%. Discussed with him about CREST II trial but he is not interested. He is still on dural antiplatelet now at 3 months post stroke. Will change to plavix monotherapy.  Plan:  - continue plavix and lipitor for stroke prevention.   - discontinue ASA - will check carotid doppler in one year. - check BP at home and record. - Follow up with your primary care physician for stroke risk factor modification. Recommend maintain blood pressure goal <130/80, diabetes with hemoglobin A1c goal below 6.5% and lipids with LDL cholesterol goal below 70 mg/dL.  - continue PT - RTC in 6 months  No orders of the defined types were placed in this encounter.    No orders of the defined types were  placed in this encounter.    Patient Instructions  - continue plavix and lipitor for stroke prevention.   - discontinue ASA - will check carotid doppler in one year. - check BP at home and record - Follow up with your primary care physician for stroke risk factor modification. Recommend maintain blood pressure goal <130/80, diabetes with hemoglobin A1c goal below 6.5% and lipids with LDL cholesterol goal below 70 mg/dL.  - continue PT and regular exercise - RTC in 6 months    Rosalin Hawking, MD PhD [facility] [address], [place], [place] [address] [phone]

## 2014-08-28 NOTE — Patient Instructions (Addendum)
-   continue plavix and lipitor for stroke prevention.   - discontinue ASA - will check carotid doppler in one year. - check BP at home and record - Follow up with your primary care physician for stroke risk factor modification. Recommend maintain blood pressure goal <130/80, diabetes with hemoglobin A1c goal below 6.5% and lipids with LDL cholesterol goal below 70 mg/dL.  - continue PT and regular exercise - RTC in 6 months

## 2014-09-07 ENCOUNTER — Other Ambulatory Visit: Payer: Self-pay

## 2014-09-07 MED ORDER — CLOPIDOGREL BISULFATE 75 MG PO TABS: 75.0000 mg | ORAL_TABLET | Freq: Every day | ORAL | Status: DC

## 2014-09-08 ENCOUNTER — Telehealth: Payer: Self-pay | Admitting: Neurology

## 2014-09-08 MED ORDER — CLOPIDOGREL BISULFATE 75 MG PO TABS: 75.0000 mg | ORAL_TABLET | Freq: Every day | ORAL | Status: DC

## 2014-09-08 NOTE — Telephone Encounter (Signed)
Rx has been sent.  Receipt confirmed by [facility].

## 2014-09-08 NOTE — Telephone Encounter (Signed)
Pt needs refill on clopidogrel (PLAVIX) 75 MG tablet. Needs only a two week supply till mail [facility] comes [facility]

## 2014-09-17 ENCOUNTER — Ambulatory Visit (HOSPITAL_COMMUNITY): Payer: Medicare Other | Attending: Family Medicine | Admitting: Physical Therapy

## 2014-09-17 DIAGNOSIS — R262 Difficulty in walking, not elsewhere classified: Secondary | ICD-10-CM | POA: Insufficient documentation

## 2014-09-17 DIAGNOSIS — R29898 Other symptoms and signs involving the musculoskeletal system: Secondary | ICD-10-CM | POA: Diagnosis not present

## 2014-09-17 DIAGNOSIS — Z9181 History of falling: Secondary | ICD-10-CM | POA: Diagnosis not present

## 2014-09-17 NOTE — Therapy (Signed)
[place], [place], [address] Phone: [phone]   Fax:  [phone]  Physical Therapy Evaluation  Patient Details  Name: Henry [name] MRN: [id number] Date of Birth: February 13, 1935 Referring Provider:  Rosalin Hawking, MD  Encounter Date: 09/17/2014      PT End of Session - 09/17/14 1123    Visit Number 1   Number of Visits 12   Date for PT Re-Evaluation 10/15/14   Authorization Type Medicare   Authorization - Visit Number 1   Authorization - Number of Visits 10   PT Start Time 0930   PT Stop Time 1003   PT Time Calculation (min) 33 min   Activity Tolerance Patient tolerated treatment well   Behavior During Therapy [facility] for tasks assessed/performed      Past Medical History  Diagnosis Date  . Hypertension   . GERD (gastroesophageal reflux disease)   . Hypercholesteremia   . HOH (hard of hearing)   . Stroke     had a stroke 05/23/2014  . Hx-TIA (transient ischemic attack)     hx of this- wife states 10 years ago-but Dr. Estil Daft told wife he had had several-has 2 stents in carotid artery-left  . FOOT SPRAIN, RIGHT 07/18/2007    Qualifier: Diagnosis of  By: Aline Brochure MD, [name]    . Esophagitis 05/10/2011  . Accelerated hypertension 06/29/2014  . Arthritis     Back  . Foley catheter in place     Past Surgical History  Procedure Laterality Date  . Shoulder surgery Right     torn cartiledge  . Esophagogastroduodenoscopy  10/28/2010    Procedure: ESOPHAGOGASTRODUODENOSCOPY (EGD);  Surgeon: [name], MD;  [facility];  Service: Endoscopy;  Laterality: N/A;  10:30  . Colonoscopy    . Colonoscopy  05/17/2011    Procedure: COLONOSCOPY;  Surgeon: [name], MD;  [facility];  Service: Endoscopy;  Laterality: N/A;  830  . Resection distal clavical Right 12/13/2012    Procedure: RESECTION DISTAL CLAVICAL;  Surgeon: Carole Civil, MD;  [facility]: [facility];  Service: Orthopedics;   Laterality: Right;  . Shoulder arthroscopy with bicepstenotomy Right 12/13/2012    Procedure: SHOULDER ARTHROSCOPY WITH BICEPS TENOTOMY, limited shoulder debridement;  Surgeon: Carole Civil, MD;  [facility]: [facility];  Service: Orthopedics;  Laterality: Right;  . Esophagogastroduodenoscopy N/A 04/24/2014    Procedure: ESOPHAGOGASTRODUODENOSCOPY (EGD);  Surgeon: [name], MD;  [facility];  Service: Endoscopy;  Laterality: N/A;  815  . Radiology with anesthesia N/A 05/23/2014    Procedure: RADIOLOGY WITH ANESTHESIA;  Surgeon: Medication Radiologist, MD;  Location: [facility];  Service: Radiology;  Laterality: N/A;  . Epidural steroid injection  05/22/2014    had this in lumbar and at 1 am on 05/23/2014 had a stroke  . Carotid stents  05/2014    2 stents in carotid artery on left side  . Shoulder surgery      right shoulder  . Eus N/A 06/26/2014    Procedure: FULL UPPER ENDOSCOPIC ULTRASOUND (EUS) RADIAL;  Surgeon: [name], MD;  [facility];  Service: Endoscopy;  Laterality: N/A;  . Esophagogastroduodenoscopy N/A 06/26/2014    Procedure: ESOPHAGOGASTRODUODENOSCOPY (EGD);  Surgeon: [name], MD;  Location: [facility];  Service: Endoscopy;  Laterality: N/A;  . Transurethral resection of prostate N/A 07/09/2014    Procedure: TRANSURETHRAL RESECTION of prostate WITH BUTTON ;  Surgeon: [name], MD;  Location: [facility];  Service: Urology;  Laterality: N/A;    There were no vitals filed for this visit.  Visit Diagnosis:  Difficulty walking  Risk for falls  Weakness of right leg      Subjective Assessment - 09/17/14 0933    Subjective Pt had L CVA 05/29/14, and now c/o numbness in his neck, R foot, and R hand. He reports that he has noticed some impairments in his balance, and that he often staggers when he walks. He reports that when he first wakes up in the morning, he staggers a lot when he is walking through his home. Pt reports that he wants to improve his  ability to walk without veering to the right and to improve his balance.     How long can you sit comfortably? no limitations   How long can you stand comfortably? no limitations   How long can you walk comfortably? walks 3 miles every morning, but has difficulty with walking straight.    Patient Stated Goals Improve walking, improve balance   Currently in Pain? No/denies            [facility] PT Assessment - 09/17/14 0001    Assessment   Medical Diagnosis Residual weakness/fall risk from CVA   Next MD Visit 6 months   Prior Therapy no   Precautions   Precautions Fall   Balance Screen   Has the patient fallen in the past 6 months No   Has the patient had a decrease in activity level because of a fear of falling?  No   Is the patient reluctant to leave their home because of a fear of falling?  No   Home Environment   Living Environment Private residence   Living Arrangements Spouse/significant other   Type of [place] level   Prior Function   Level of Independence Independent;Independent with basic ADLs;Independent with household mobility without device;Independent with transfers;Independent with gait   Vocation Retired   Runner, broadcasting/film/video / Strength   AROM / PROM / Strength AROM;PROM;Strength   AROM   Overall AROM  Within functional limits for tasks performed   PROM   Overall PROM  Within functional limits for tasks performed   Strength   Strength Assessment Site Hip;Knee   Right/Left Hip Right;Left   Right Hip Flexion 3-/5   Right Hip Extension 4-/5   Right Hip ABduction 4/5   Left Hip Flexion 4+/5   Left Hip Extension 4/5   Left Hip ABduction 4/5   Right/Left Knee Right;Left   Right Knee Flexion 4/5   Right Knee Extension 4+/5   Left Knee Flexion 4+/5   Left Knee Extension 5/5   Transfers   Five time sit to stand comments  9.78 seconds   Ambulation/Gait   Ambulation/Gait --   Standardized Balance Assessment   Standardized Balance Assessment  Berg Balance Test   Berg Balance Test   Sit to Stand Able to stand without using hands and stabilize independently   Standing Unsupported Able to stand safely 2 minutes   Sitting with Back Unsupported but Feet Supported on Floor or Stool Able to sit safely and securely 2 minutes   Stand to Sit Sits safely with minimal use of hands   Transfers Able to transfer safely, minor use of hands   Standing Unsupported with Eyes Closed Able to stand 10 seconds with supervision   Standing Ubsupported with Feet Together Able to place feet together independently and  stand for 1 minute with supervision   From Standing, Reach Forward with Outstretched Arm Can reach forward >12 cm safely (5")   From Standing Position, Pick up Object from [place] to pick up shoe safely and easily   From Standing Position, Turn to Look Behind Over each Shoulder Looks behind from both sides and weight shifts well   Turn 360 Degrees Able to turn 360 degrees safely in 4 seconds or less   Standing Unsupported, Alternately Place Feet on Step/Stool Able to stand independently and complete 8 steps >20 seconds   Standing Unsupported, One Foot in Front Needs help to step but can hold 15 seconds   Standing on One Leg Tries to lift leg/unable to hold 3 seconds but remains standing independently   Total Score 46   Functional Gait  Assessment   Gait assessed  Yes   Gait Level Surface Walks 20 ft, slow speed, abnormal gait pattern, evidence for imbalance or deviates 10-15 in outside of the 12 in walkway width. Requires more than 7 sec to ambulate 20 ft.   Change in Gait Speed Makes only minor adjustments to walking speed, or accomplishes a change in speed with significant gait deviations, deviates 10-15 in outside the 12 in walkway width, or changes speed but loses balance but is able to recover and continue walking.   Gait with Horizontal Head Turns Performs head turns smoothly with slight change in gait velocity (eg, minor disruption to  smooth gait path), deviates 6-10 in outside 12 in walkway width, or uses an assistive device.   Gait with Vertical Head Turns Performs task with slight change in gait velocity (eg, minor disruption to smooth gait path), deviates 6 - 10 in outside 12 in walkway width or uses assistive device   Gait and Pivot Turn Pivot turns safely in greater than 3 sec and stops with no loss of balance, or pivot turns safely within 3 sec and stops with mild imbalance, requires small steps to catch balance.   Step Over Obstacle Is able to step over one shoe box (4.5 in total height) but must slow down and adjust steps to clear box safely. May require verbal cueing.   Gait with Narrow Base of Support Ambulates 4-7 steps.   Gait with Eyes Closed Walks 20 ft, uses assistive device, slower speed, mild gait deviations, deviates 6-10 in outside 12 in walkway width. Ambulates 20 ft in less than 9 sec but greater than 7 sec.   Ambulating Backwards Walks 20 ft, uses assistive device, slower speed, mild gait deviations, deviates 6-10 in outside 12 in walkway width.   Steps Alternating feet, no rail.   Total Score 17              PT Education - 09/17/14 1123    Education provided Yes   Education Details POC, impairments   Person(s) Educated Patient   Methods Explanation   Comprehension Verbalized understanding          PT Short Term Goals - 09/17/14 1132    PT SHORT TERM GOAL #1   Title Pt will be independent with HEP.    Time 3   Period Weeks   Status New   PT SHORT TERM GOAL #2   Title RLE strength will improve by 1/2 grade to improve gait mechanics and functional mobility.   Time 3   Period Weeks   Status New   PT SHORT TERM GOAL #3   Title Pt will demonstrate improved gait  mechanics and decreased fall risk per score of 20 on functional gait assessment.    Time 3   Period Weeks   Status New           PT Long Term Goals - Sep 19, 2014 1134    PT LONG TERM GOAL #1   Title Pt will be independent  with advanced HEP for BLE strengthening and balance training.   Baseline     Time 6   Period Weeks   Status New   PT LONG TERM GOAL #2   Title Improve RLE strength by 1 grade to improve gait mechanics and functional mobility.    Time 6   Period Weeks   Status New   PT LONG TERM GOAL #3   Title Pt will demonstrate improved dynamic and static balance as evidenced by score of 50/56 on Berg.   Time 6   Period Weeks   Status New   PT LONG TERM GOAL #4   Title Pt will demonstrated improved gait mechanics and decreased fall risk per score of 25 on functional gait assessment.    Time 6   Period Weeks   Status New               Plan - September 19, 2014 1124    Clinical Impression Statement Pt presents to PT with weakness of RLE, impaired gait, and balance deficit. Pt stated that one of his main concerns was getting rid of the numbness in his foot and hand, and he was educated on nerve healing and it was explained that PT could not do much to help the numbness. Pt demonstrates good static balance, however, his dynamic balance during functional activities and walking is fair. He will benefit from skilled physical therapy to address impairments in strength, balance, and gait in order to decrease fall risk, decrease caregiver burden, and return pt to PLOF.    Pt will benefit from skilled therapeutic intervention in order to improve on the following deficits Abnormal gait;Decreased balance;Decreased activity tolerance;Decreased strength;Difficulty walking   Rehab Potential Good   PT Frequency 2x / week   PT Duration 6 weeks   PT Treatment/Interventions Gait training;Stair training;Functional mobility training;Therapeutic activities;Therapeutic exercise;Balance training;Neuromuscular re-education   PT Next Visit Plan Begin functional LE strengthening, balance training   Consulted and Agree with Plan of Care Patient          G-Codes - September 19, 2014 1258    Functional Assessment Tool Used FOTO,  clinical judgement- functional gait assessment, RLE MMT   Functional Limitation Mobility: Walking and moving around   Mobility: Walking and Moving Around Current Status ([id number]) At least 20 percent but less than 40 percent impaired, limited or restricted   Mobility: Walking and Moving Around Goal Status [phone]) At least 1 percent but less than 20 percent impaired, limited or restricted       Problem List Patient Active Problem List   Diagnosis Date Noted  . Accelerated hypertension 08/28/2014  . BPH (benign prostatic hypertrophy) with urinary obstruction 07/09/2014  . Right leg swelling 07/06/2014  . UTI (urinary tract infection) 07/06/2014  . ARF (acute renal failure) as a complication of the indwelling Foley 07/05/2014  . Cerebral infarction due to embolism of left middle cerebral artery 06/29/2014  . Carotid stenosis 06/29/2014  . Urinary retention 06/29/2014  . Hematuria 06/29/2014  . Aphasia S/P CVA 05/28/2014  . Dysphagia, pharyngoesophageal phase 05/28/2014  . Left middle cerebral artery stroke 05/26/2014  . Coarctation of aorta, recurrent, post-intervention   .  Expressive aphasia   . Acute right-sided weakness   . Stroke 05/23/2014  . S/P shoulder surgery 12/24/2012  . S/P arthroscopy of shoulder 12/16/2012  . Shoulder arthritis 12/13/2012  . Biceps tendon rupture, proximal 12/13/2012  . Partial tear of right subscapularis tendon 12/13/2012  . Hypertension 05/10/2011  . Hyperlipidemia 05/10/2011  . Barrett esophagus 05/10/2011    Hilma Favors, PT, DPT [phone] 09/17/2014, 1:00 PM  [place], [place], [address] Phone: [phone]   Fax:  [phone]

## 2014-09-22 ENCOUNTER — Ambulatory Visit (HOSPITAL_COMMUNITY): Payer: Medicare Other

## 2014-09-22 DIAGNOSIS — R29898 Other symptoms and signs involving the musculoskeletal system: Secondary | ICD-10-CM | POA: Diagnosis not present

## 2014-09-22 DIAGNOSIS — R262 Difficulty in walking, not elsewhere classified: Secondary | ICD-10-CM | POA: Diagnosis not present

## 2014-09-22 DIAGNOSIS — Z9181 History of falling: Secondary | ICD-10-CM | POA: Diagnosis not present

## 2014-09-22 NOTE — Therapy (Signed)
[place], [place], [address] Phone: [phone]   Fax:  [phone]  Physical Therapy Treatment  Patient Details  Name: Henry [name] MRN: [id number] Date of Birth: [birth year] Referring Provider:  Marjean Donna, MD  Encounter Date: 09/22/2014      PT End of Session - 09/22/14 1522    Visit Number 2   Number of Visits 12   Date for PT Re-Evaluation 10/15/14   Authorization Type Medicare   Authorization - Visit Number 2   Authorization - Number of Visits 10   PT Start Time 8841   PT Stop Time 1518   PT Time Calculation (min) 44 min   Equipment Utilized During Treatment Gait belt   Activity Tolerance Patient tolerated treatment well   Behavior During Therapy [facility] for tasks assessed/performed      Past Medical History  Diagnosis Date  . Hypertension   . GERD (gastroesophageal reflux disease)   . Hypercholesteremia   . HOH (hard of hearing)   . Stroke     had a stroke 05/23/2014  . Hx-TIA (transient ischemic attack)     hx of this- wife states 10 years ago-but Dr. Estil Daft told wife he had had several-has 2 stents in carotid artery-left  . FOOT SPRAIN, RIGHT 07/18/2007    Qualifier: Diagnosis of  By: Aline Brochure MD, [name]    . Esophagitis 05/10/2011  . Accelerated hypertension 06/29/2014  . Arthritis     Back  . Foley catheter in place     Past Surgical History  Procedure Laterality Date  . Shoulder surgery Right     torn cartiledge  . Esophagogastroduodenoscopy  10/28/2010    Procedure: ESOPHAGOGASTRODUODENOSCOPY (EGD);  Surgeon: [name], MD;  [facility];  Service: Endoscopy;  Laterality: N/A;  10:30  . Colonoscopy    . Colonoscopy  05/17/2011    Procedure: COLONOSCOPY;  Surgeon: [name], MD;  [facility];  Service: Endoscopy;  Laterality: N/A;  830  . Resection distal clavical Right 12/13/2012    Procedure: RESECTION DISTAL CLAVICAL;  Surgeon: Carole Civil, MD;  Location: [facility];  Service: Orthopedics;  Laterality: Right;  . Shoulder arthroscopy with bicepstenotomy Right 12/13/2012    Procedure: SHOULDER ARTHROSCOPY WITH BICEPS TENOTOMY, limited shoulder debridement;  Surgeon: Carole Civil, MD;  [facility];  Service: Orthopedics;  Laterality: Right;  . Esophagogastroduodenoscopy N/A 04/24/2014    Procedure: ESOPHAGOGASTRODUODENOSCOPY (EGD);  Surgeon: [name], MD;  [facility];  Service: Endoscopy;  Laterality: N/A;  815  . Radiology with anesthesia N/A 05/23/2014    Procedure: RADIOLOGY WITH ANESTHESIA;  Surgeon: Medication Radiologist, MD;  Location: [facility];  Service: Radiology;  Laterality: N/A;  . Epidural steroid injection  05/22/2014    had this in lumbar and at 1 am on 05/23/2014 had a stroke  . Carotid stents  05/2014    2 stents in carotid artery on left side  . Shoulder surgery      right shoulder  . Eus N/A 06/26/2014    Procedure: FULL UPPER ENDOSCOPIC ULTRASOUND (EUS) RADIAL;  Surgeon: [name], MD;  [facility];  Service: Endoscopy;  Laterality: N/A;  . Esophagogastroduodenoscopy N/A 06/26/2014    Procedure: ESOPHAGOGASTRODUODENOSCOPY (EGD);  Surgeon: [name], MD;  Location: [facility];  Service: Endoscopy;  Laterality: N/A;  . Transurethral resection of prostate N/A 07/09/2014    Procedure: TRANSURETHRAL RESECTION of prostate WITH BUTTON ;  Surgeon: [name], MD;  [facility];  Service: Urology;  Laterality: N/A;    There were no vitals filed for this visit.  Visit Diagnosis:  Difficulty walking  Risk for falls  Weakness of right leg      Subjective Assessment - 09/22/14 1442    Subjective Pt stated he has numbness down Rt side and difficulty writing.  No reports of pain today.            [facility] PT Treatment/Exercise - 09/22/14 0001    Exercises   Exercises Knee/Hip   Knee/Hip Exercises: Standing   Heel Raises 15 reps   Heel Raises Limitations Toe  raise   Forward Lunges Both;15 reps   Forward Lunges Limitations 6in step   Functional Squat 15 reps   Rocker Board 2 minutes   Rocker Board Limitations R/L and A/P   SLS Lt 15", Rt 12" max of 3             Balance Exercises - 09/22/14 1626    Balance Exercises: Standing   Tandem Stance Eyes open;Foam/compliant surface;3 reps;30 secs  no HHA on foam   SLS Eyes open;3 reps   Tandem Gait 2 reps   Retro Gait 2 reps   Sidestepping 2 reps;Theraband  RTB   Marching Limitations Marching on foam 10x 5" with intermittern 1 finger A           PT Education - 09/22/14 1634    Education provided Yes   Education Details HEP given   Person(s) Educated Patient   Methods Explanation;Demonstration;Handout   Comprehension Verbalized understanding;Returned demonstration          PT Short Term Goals - 09/22/14 1634    PT SHORT TERM GOAL #1   Title Pt will be independent with HEP.    Status On-going   PT SHORT TERM GOAL #2   Title RLE strength will improve by 1/2 grade to improve gait mechanics and functional mobility.   Status On-going   PT SHORT TERM GOAL #3   Title Pt will demonstrate improved gait mechanics and decreased fall risk per score of 20 on functional gait assessment.    Status On-going           PT Long Term Goals - 09/17/14 1134    PT LONG TERM GOAL #1   Title Pt will be independent with advanced HEP for BLE strengthening and balance training.   Baseline     Time 6   Period Weeks   Status New   PT LONG TERM GOAL #2   Title Improve RLE strength by 1 grade to improve gait mechanics and functional mobility.    Time 6   Period Weeks   Status New   PT LONG TERM GOAL #3   Title Pt will demonstrate improved dynamic and static balance as evidenced by score of 50/56 on Berg.   Time 6   Period Weeks   Status New   PT LONG TERM GOAL #4   Title Pt will demonstrated improved gait mechanics and decreased fall risk per score of 25 on functional gait assessment.     Time 6   Period Weeks   Status New               Plan - 09/22/14 1628    Clinical Impression Statement Reviewed goals, HEP given this session and copy of evaluation.  Session focus on functional strenghtening and balance training with cueing for form and technique through  session with most exercises.  Balance activities focus on NBOS to improve balance with min assistnace and cueing to improve spatial awareness with less assistance required following.  Pt with most difficulty with marching on foam with 5" holds for SLS.  Pt limited by fatigue, no reports of pain through session.     PT Next Visit Plan Begin functional LE strengthening, balance training on dynamic surfaces next session and hurdles   PT Home Exercise Plan Given:  Tandem stance, SLS, squats and standing abduction        Problem List Patient Active Problem List   Diagnosis Date Noted  . Accelerated hypertension 08/28/2014  . BPH (benign prostatic hypertrophy) with urinary obstruction 07/09/2014  . Right leg swelling 07/06/2014  . UTI (urinary tract infection) 07/06/2014  . ARF (acute renal failure) as a complication of the indwelling Foley 07/05/2014  . Cerebral infarction due to embolism of left middle cerebral artery 06/29/2014  . Carotid stenosis 06/29/2014  . Urinary retention 06/29/2014  . Hematuria 06/29/2014  . Aphasia S/P CVA 05/28/2014  . Dysphagia, pharyngoesophageal phase 05/28/2014  . Left middle cerebral artery stroke 05/26/2014  . Coarctation of aorta, recurrent, post-intervention   . Expressive aphasia   . Acute right-sided weakness   . Stroke 05/23/2014  . S/P shoulder surgery 12/24/2012  . S/P arthroscopy of shoulder 12/16/2012  . Shoulder arthritis 12/13/2012  . Biceps tendon rupture, proximal 12/13/2012  . Partial tear of right subscapularis tendon 12/13/2012  . Hypertension 05/10/2011  . Hyperlipidemia 05/10/2011  . Barrett esophagus 05/10/2011   Ihor Austin, LPTA;  CBIS [phone]   Aldona Lento 09/22/2014, 4:35 PM  [facility] [address] [place], [place], [address] Phone: [phone]   Fax:  [phone]

## 2014-09-22 NOTE — Patient Instructions (Signed)
Tandem Stance   Right foot in front of left, heel touching toe both feet "straight ahead". Stand on Foot Triangle of Support with both feet. Balance in this position 30 seconds. Do with left foot in front of right.  Copyright  VHI. All rights reserved.   SINGLE LIMB STANCE   Stance: single leg on floor. Raise leg. Hold 60 seconds. Repeat with other leg. 5 reps per set, 1-2 sets per day.   Copyright  VHI. All rights reserved.   FUNCTIONAL MOBILITY: Squat   Stance: shoulder-width on floor. Bend hips and knees. Keep back straight. Do not allow knees to bend past toes. Squeeze glutes and quads to stand. 10-20  reps per set, 1-2 sets per day.  Copyright  VHI. All rights reserved.   ABDUCTION: Standing (Active)   Stand, feet flat. Lift right leg out to side.  Complete 1-2 sets of 10-20 repetitions.   http://gtsc.exer.us/110   Copyright  VHI. All rights reserved.

## 2014-09-24 ENCOUNTER — Ambulatory Visit (HOSPITAL_COMMUNITY): Payer: Medicare Other | Admitting: Physical Therapy

## 2014-09-24 DIAGNOSIS — Z9181 History of falling: Secondary | ICD-10-CM

## 2014-09-24 DIAGNOSIS — R29898 Other symptoms and signs involving the musculoskeletal system: Secondary | ICD-10-CM | POA: Diagnosis not present

## 2014-09-24 DIAGNOSIS — R262 Difficulty in walking, not elsewhere classified: Secondary | ICD-10-CM

## 2014-09-24 NOTE — Therapy (Signed)
[place], [place], [address] Phone: [phone]   Fax:  [phone]  Physical Therapy Treatment  Patient Details  Name: Henry [name] MRN: [id number] Date of Birth: 1934-09-21 Referring Provider:  Marjean Donna, MD  Encounter Date: 09/24/2014      PT End of Session - 09/24/14 1805    Visit Number 3   Number of Visits 12   Date for PT Re-Evaluation 10/15/14   Authorization Type Medicare   Authorization - Visit Number 3   Authorization - Number of Visits 10   PT Start Time 1721   PT Stop Time 1800   PT Time Calculation (min) 39 min   Activity Tolerance Patient tolerated treatment well   Behavior During Therapy [facility] for tasks assessed/performed      Past Medical History  Diagnosis Date  . Hypertension   . GERD (gastroesophageal reflux disease)   . Hypercholesteremia   . HOH (hard of hearing)   . Stroke     had a stroke 05/23/2014  . Hx-TIA (transient ischemic attack)     hx of this- wife states 10 years ago-but Dr. Estil Daft told wife he had had several-has 2 stents in carotid artery-left  . FOOT SPRAIN, RIGHT 07/18/2007    Qualifier: Diagnosis of  By: Aline Brochure MD, [name]    . Esophagitis 05/10/2011  . Accelerated hypertension 06/29/2014  . Arthritis     Back  . Foley catheter in place     Past Surgical History  Procedure Laterality Date  . Shoulder surgery Right     torn cartiledge  . Esophagogastroduodenoscopy  10/28/2010    Procedure: ESOPHAGOGASTRODUODENOSCOPY (EGD);  Surgeon: [name], MD;  [facility];  Service: Endoscopy;  Laterality: N/A;  10:30  . Colonoscopy    . Colonoscopy  05/17/2011    Procedure: COLONOSCOPY;  Surgeon: [name], MD;  [facility];  Service: Endoscopy;  Laterality: N/A;  830  . Resection distal clavical Right 12/13/2012    Procedure: RESECTION DISTAL CLAVICAL;  Surgeon: Carole Civil, MD;  [facility];  Service:  Orthopedics;  Laterality: Right;  . Shoulder arthroscopy with bicepstenotomy Right 12/13/2012    Procedure: SHOULDER ARTHROSCOPY WITH BICEPS TENOTOMY, limited shoulder debridement;  Surgeon: Carole Civil, MD;  [facility];  Service: Orthopedics;  Laterality: Right;  . Esophagogastroduodenoscopy N/A 04/24/2014    Procedure: ESOPHAGOGASTRODUODENOSCOPY (EGD);  Surgeon: [name], MD;  [facility];  Service: Endoscopy;  Laterality: N/A;  815  . Radiology with anesthesia N/A 05/23/2014    Procedure: RADIOLOGY WITH ANESTHESIA;  Surgeon: Medication Radiologist, MD;  Location: [place];  Service: Radiology;  Laterality: N/A;  . Epidural steroid injection  05/22/2014    had this in lumbar and at 1 am on 05/23/2014 had a stroke  . Carotid stents  05/2014    2 stents in carotid artery on left side  . Shoulder surgery      right shoulder  . Eus N/A 06/26/2014    Procedure: FULL UPPER ENDOSCOPIC ULTRASOUND (EUS) RADIAL;  Surgeon: [name], MD;  [facility];  Service: Endoscopy;  Laterality: N/A;  . Esophagogastroduodenoscopy N/A 06/26/2014    Procedure: ESOPHAGOGASTRODUODENOSCOPY (EGD);  Surgeon: [name], MD;  Location: [facility];  Service: Endoscopy;  Laterality: N/A;  . Transurethral resection of prostate N/A 07/09/2014    Procedure: TRANSURETHRAL RESECTION of prostate WITH BUTTON ;  Surgeon: [name], MD;  Location: [facility];  Service: Urology;  Laterality: N/A;    There were no vitals filed for this visit.  Visit Diagnosis:  Difficulty walking  Risk for falls  Weakness of right leg      Subjective Assessment - 09/24/14 1723    Subjective Patient reports that he is doing well today, just having some muscle soreness today   Currently in Pain? No/denies                         [facility] Adult PT Treatment/Exercise - 09/24/14 0001    Knee/Hip Exercises: Stretches   Active Hamstring Stretch Both;2 reps;30 seconds   Active Hamstring  Stretch Limitations 12 inch box    Piriformis Stretch --   Piriformis Stretch Limitations --   Gastroc Stretch Both;2 reps;30 seconds   Gastroc Stretch Limitations slantboard    Knee/Hip Exercises: Standing   Heel Raises Both;1 set;10 reps   Heel Raises Limitations U, each side    Rocker Board Limitations x20 [facility], 20 lateral no HHA    Other Standing Knee Exercises 3D hip excursions 1x10   Other Standing Knee Exercises Hip ABD walks 2x48ft             Balance Exercises - 09/24/14 1726    Balance Exercises: Standing   Tandem Stance Eyes closed;Foam/compliant surface;3 reps;15 secs   Gait with Head Turns --   Tandem Gait --   Marching Limitations Marching on foam 10x 5" with intermittern 1 finger A  marching on BOSU U HHA    Other Standing Exercises Step ups on BOSU, hurdles 6 and 12 inch, retro gait 4 reps, walking over unstable mats with twoels under them, static stance on BOSU no HHA            PT Education - 09/24/14 1805    Education provided No          PT Short Term Goals - 09/22/14 1634    PT SHORT TERM GOAL #1   Title Pt will be independent with HEP.    Status On-going   PT SHORT TERM GOAL #2   Title RLE strength will improve by 1/2 grade to improve gait mechanics and functional mobility.   Status On-going   PT SHORT TERM GOAL #3   Title Pt will demonstrate improved gait mechanics and decreased fall risk per score of 20 on functional gait assessment.    Status On-going           PT Long Term Goals - 09/17/14 1134    PT LONG TERM GOAL #1   Title Pt will be independent with advanced HEP for BLE strengthening and balance training.   Baseline     Time 6   Period Weeks   Status New   PT LONG TERM GOAL #2   Title Improve RLE strength by 1 grade to improve gait mechanics and functional mobility.    Time 6   Period Weeks   Status New   PT LONG TERM GOAL #3   Title Pt will demonstrate improved dynamic and static balance as evidenced by score of 50/56  on Berg.   Time 6   Period Weeks   Status New   PT LONG TERM GOAL #4   Title Pt will demonstrated improved gait mechanics and decreased fall risk per score of 25 on functional gait assessment.    Time 6   Period Weeks   Status New  Plan - 09/24/14 1805    Clinical Impression Statement Continued functional exercises and balance activities today with introduction of BOSU and ambulation over unsteady, unpredictable surfaces. Patient tolerated all exercises and activities very well today wtih good form throughout. No pain during session.    Pt will benefit from skilled therapeutic intervention in order to improve on the following deficits Abnormal gait;Decreased balance;Decreased activity tolerance;Decreased strength;Difficulty walking   Rehab Potential Good   PT Frequency 2x / week   PT Duration 6 weeks   PT Treatment/Interventions Gait training;Stair training;Functional mobility training;Therapeutic activities;Therapeutic exercise;Balance training;Neuromuscular re-education   PT Next Visit Plan Continue challenges to dynamic balance; consider DC with advanched HEP  within next 1-2 sessions due to high level of function.    PT Home Exercise Plan Given:  Tandem stance, SLS, squats and standing abduction   Consulted and Agree with Plan of Care Patient        Problem List Patient Active Problem List   Diagnosis Date Noted  . Accelerated hypertension 08/28/2014  . BPH (benign prostatic hypertrophy) with urinary obstruction 07/09/2014  . Right leg swelling 07/06/2014  . UTI (urinary tract infection) 07/06/2014  . ARF (acute renal failure) as a complication of the indwelling Foley 07/05/2014  . Cerebral infarction due to embolism of left middle cerebral artery 06/29/2014  . Carotid stenosis 06/29/2014  . Urinary retention 06/29/2014  . Hematuria 06/29/2014  . Aphasia S/P CVA 05/28/2014  . Dysphagia, pharyngoesophageal phase 05/28/2014  . Left middle cerebral artery  stroke 05/26/2014  . Coarctation of aorta, recurrent, post-intervention   . Expressive aphasia   . Acute right-sided weakness   . Stroke 05/23/2014  . S/P shoulder surgery 12/24/2012  . S/P arthroscopy of shoulder 12/16/2012  . Shoulder arthritis 12/13/2012  . Biceps tendon rupture, proximal 12/13/2012  . Partial tear of right subscapularis tendon 12/13/2012  . Hypertension 05/10/2011  . Hyperlipidemia 05/10/2011  . Barrett esophagus 05/10/2011    Deniece Ree PT, DPT [place] [address] [place], [place], [address] Phone: [phone]   Fax:  [phone]

## 2014-09-30 ENCOUNTER — Ambulatory Visit (HOSPITAL_COMMUNITY): Payer: Medicare Other | Admitting: Physical Therapy

## 2014-09-30 DIAGNOSIS — R29898 Other symptoms and signs involving the musculoskeletal system: Secondary | ICD-10-CM | POA: Diagnosis not present

## 2014-09-30 DIAGNOSIS — R262 Difficulty in walking, not elsewhere classified: Secondary | ICD-10-CM | POA: Diagnosis not present

## 2014-09-30 DIAGNOSIS — Z9181 History of falling: Secondary | ICD-10-CM | POA: Diagnosis not present

## 2014-09-30 NOTE — Therapy (Signed)
[place], [place], [address] Phone: [phone]   Fax:  [phone]  Physical Therapy Treatment (Discharge Summary)  Patient Details  Name: Henry [name] MRN: [id number] Date of Birth: November 29, 1934 Referring Provider:  Marjean Donna, MD  Encounter Date: 09/30/2014      PT End of Session - 09/30/14 1719    Visit Number 4   Number of Visits 12   Authorization Type Medicare   Authorization - Visit Number 4   Authorization - Number of Visits 10   PT Start Time 2836   PT Stop Time 6294   PT Time Calculation (min) 25 min   Activity Tolerance Patient tolerated treatment well   Behavior During Therapy [facility] for tasks assessed/performed      Past Medical History  Diagnosis Date  . Hypertension   . GERD (gastroesophageal reflux disease)   . Hypercholesteremia   . HOH (hard of hearing)   . Stroke     had a stroke 05/23/2014  . Hx-TIA (transient ischemic attack)     hx of this- wife states 10 years ago-but Dr. Estil Daft told wife he had had several-has 2 stents in carotid artery-left  . FOOT SPRAIN, RIGHT 07/18/2007    Qualifier: Diagnosis of  By: Aline Brochure MD, [name]    . Esophagitis 05/10/2011  . Accelerated hypertension 06/29/2014  . Arthritis     Back  . Foley catheter in place     Past Surgical History  Procedure Laterality Date  . Shoulder surgery Right     torn cartiledge  . Esophagogastroduodenoscopy  10/28/2010    Procedure: ESOPHAGOGASTRODUODENOSCOPY (EGD);  Surgeon: [name], MD;  [facility];  Service: Endoscopy;  Laterality: N/A;  10:30  . Colonoscopy    . Colonoscopy  05/17/2011    Procedure: COLONOSCOPY;  Surgeon: [name], MD;  [facility];  Service: Endoscopy;  Laterality: N/A;  830  . Resection distal clavical Right 12/13/2012    Procedure: RESECTION DISTAL CLAVICAL;  Surgeon: Carole Civil, MD;  [facility];  Service: Orthopedics;  Laterality:  Right;  . Shoulder arthroscopy with bicepstenotomy Right 12/13/2012    Procedure: SHOULDER ARTHROSCOPY WITH BICEPS TENOTOMY, limited shoulder debridement;  Surgeon: Carole Civil, MD;  [facility];  Service: Orthopedics;  Laterality: Right;  . Esophagogastroduodenoscopy N/A 04/24/2014    Procedure: ESOPHAGOGASTRODUODENOSCOPY (EGD);  Surgeon: [name], MD;  [facility];  Service: Endoscopy;  Laterality: N/A;  815  . Radiology with anesthesia N/A 05/23/2014    Procedure: RADIOLOGY WITH ANESTHESIA;  Surgeon: Medication Radiologist, MD;  Location: [place];  Service: Radiology;  Laterality: N/A;  . Epidural steroid injection  05/22/2014    had this in lumbar and at 1 am on 05/23/2014 had a stroke  . Carotid stents  05/2014    2 stents in carotid artery on left side  . Shoulder surgery      right shoulder  . Eus N/A 06/26/2014    Procedure: FULL UPPER ENDOSCOPIC ULTRASOUND (EUS) RADIAL;  Surgeon: [name], MD;  [facility];  Service: Endoscopy;  Laterality: N/A;  . Esophagogastroduodenoscopy N/A 06/26/2014    Procedure: ESOPHAGOGASTRODUODENOSCOPY (EGD);  Surgeon: [name], MD;  Location: [facility];  Service: Endoscopy;  Laterality: N/A;  . Transurethral resection of prostate N/A 07/09/2014    Procedure: TRANSURETHRAL RESECTION of prostate WITH BUTTON ;  Surgeon: [name], MD;  [facility];  Service: Urology;  Laterality: N/A;    There were no vitals filed for this visit.  Visit Diagnosis:  Difficulty walking  Risk for falls  Weakness of right leg          [facility] PT Assessment - 09/30/14 0001    Strength   Right Hip Flexion 4/5   Right Hip Extension 4/5   Right Hip ABduction 4+/5   Left Hip Flexion 4+/5   Left Hip Extension 4/5   Left Hip ABduction 4/5   Right Knee Flexion 4/5   Right Knee Extension 5/5   Left Knee Flexion 4+/5   Left Knee Extension 5/5   Transfers   Five time sit to stand comments  12.7 seconds    Berg Balance  Test   Sit to Stand Able to stand without using hands and stabilize independently   Standing Unsupported Able to stand safely 2 minutes   Sitting with Back Unsupported but Feet Supported on Floor or Stool Able to sit safely and securely 2 minutes   Stand to Sit Sits safely with minimal use of hands   Transfers Able to transfer safely, minor use of hands   Standing Unsupported with Eyes Closed Able to stand 10 seconds safely   Standing Ubsupported with Feet Together Able to place feet together independently and stand 1 minute safely   From Standing, Reach Forward with Outstretched Arm Can reach forward >12 cm safely (5")   From Standing Position, Pick up Object from Floor Able to pick up shoe safely and easily   From Standing Position, Turn to Look Behind Over each Shoulder Looks behind from both sides and weight shifts well   Turn 360 Degrees Able to turn 360 degrees safely in 4 seconds or less   Standing Unsupported, Alternately Place Feet on Step/Stool Able to stand independently and safely and complete 8 steps in 20 seconds   Standing Unsupported, One Foot in Front Able to plae foot ahead of the other independently and hold 30 seconds   Standing on One Leg Able to lift leg independently and hold > 10 seconds   Total Score 54   Functional Gait  Assessment   Gait assessed  Yes   Gait Level Surface Walks 20 ft in less than 5.5 sec, no assistive devices, good speed, no evidence for imbalance, normal gait pattern, deviates no more than 6 in outside of the 12 in walkway width.   Change in Gait Speed Able to change speed, demonstrates mild gait deviations, deviates 6-10 in outside of the 12 in walkway width, or no gait deviations, unable to achieve a major change in velocity, or uses a change in velocity, or uses an assistive device.   Gait with Horizontal Head Turns Performs head turns smoothly with slight change in gait velocity (eg, minor disruption to smooth gait path), deviates 6-10 in outside  12 in walkway width, or uses an assistive device.   Gait with Vertical Head Turns Performs task with slight change in gait velocity (eg, minor disruption to smooth gait path), deviates 6 - 10 in outside 12 in walkway width or uses assistive device   Gait and Pivot Turn Pivot turns safely in greater than 3 sec and stops with no loss of balance, or pivot turns safely within 3 sec and stops with mild imbalance, requires small steps to catch balance.   Step Over Obstacle Is able to step over one shoe box (4.5 in total height) but must slow down and adjust steps to clear box safely.  May require verbal cueing.   Gait with Narrow Base of Support Ambulates 4-7 steps.   Gait with Eyes Closed Walks 20 ft, uses assistive device, slower speed, mild gait deviations, deviates 6-10 in outside 12 in walkway width. Ambulates 20 ft in less than 9 sec but greater than 7 sec.   Ambulating Backwards Walks 20 ft, uses assistive device, slower speed, mild gait deviations, deviates 6-10 in outside 12 in walkway width.   Steps Alternating feet, no rail.   Total Score 20                     [facility] Adult PT Treatment/Exercise - 09/30/14 0001    Knee/Hip Exercises: Stretches   Active Hamstring Stretch Both;2 reps;30 seconds   Piriformis Stretch Both;2 reps;30 seconds   Piriformis Stretch Limitations seated   Gastroc Stretch Both;3 reps;30 seconds   Gastroc Stretch Limitations slantboard                 PT Education - 09/30/14 1718    Education provided Yes   Education Details DC today due to high level of function, advised to remain active to assit in preserving balance and strength    Person(s) Educated Patient   Methods Explanation   Comprehension Verbalized understanding          PT Short Term Goals - 09/30/14 1720    PT SHORT TERM GOAL #1   Title Pt will be independent with HEP.    Time 3   Period Weeks   Status Achieved   PT SHORT TERM GOAL #2   Title RLE strength will improve by  1/2 grade to improve gait mechanics and functional mobility.   Time 3   Period Weeks   Status Not Met   PT SHORT TERM GOAL #3   Title Pt will demonstrate improved gait mechanics and decreased fall risk per score of 20 on functional gait assessment.    Time 3   Period Weeks   Status Achieved           PT Long Term Goals - 09/30/14 1721    PT LONG TERM GOAL #1   Title Pt will be independent with advanced HEP for BLE strengthening and balance training.   Time 6   Period Weeks   Status Not Met   PT LONG TERM GOAL #2   Title Improve RLE strength by 1 grade to improve gait mechanics and functional mobility.    Time 6   Period Weeks   Status Not Met   PT LONG TERM GOAL #3   Title Pt will demonstrate improved dynamic and static balance as evidenced by score of 50/56 on Berg.   Time 6   Period Weeks   Status Achieved   PT LONG TERM GOAL #4   Title Pt will demonstrated improved gait mechanics and decreased fall risk per score of 25 on functional gait assessment.    Time 6   Period Weeks   Status Not Met               Plan - 09/30/14 1719    Clinical Impression Statement Discharge assessment performed today. Patient is at a high level of function and is no longer in need of skilled PT services. DC today.    Pt will benefit from skilled therapeutic intervention in order to improve on the following deficits Abnormal gait;Decreased balance;Decreased activity tolerance;Decreased strength;Difficulty walking   Rehab Potential Good   PT Next Visit Plan  DC today    PT Home Exercise Plan Given:  Tandem stance, SLS, squats and standing abduction   Consulted and Agree with Plan of Care Patient          G-Codes - 10/09/14 1722    Functional Assessment Tool Used Based on skilled clinical assessment of strength, functional gait and balance skills    Functional Limitation Mobility: Walking and moving around   Mobility: Walking and Moving Around Goal Status [phone]) At least 1  percent but less than 20 percent impaired, limited or restricted   Mobility: Walking and Moving Around Discharge Status [phone]) At least 1 percent but less than 20 percent impaired, limited or restricted      Problem List Patient Active Problem List   Diagnosis Date Noted  . Accelerated hypertension 08/28/2014  . BPH (benign prostatic hypertrophy) with urinary obstruction 07/09/2014  . Right leg swelling 07/06/2014  . UTI (urinary tract infection) 07/06/2014  . ARF (acute renal failure) as a complication of the indwelling Foley 07/05/2014  . Cerebral infarction due to embolism of left middle cerebral artery 06/29/2014  . Carotid stenosis 06/29/2014  . Urinary retention 06/29/2014  . Hematuria 06/29/2014  . Aphasia S/P CVA 05/28/2014  . Dysphagia, pharyngoesophageal phase 05/28/2014  . Left middle cerebral artery stroke 05/26/2014  . Coarctation of aorta, recurrent, post-intervention   . Expressive aphasia   . Acute right-sided weakness   . Stroke 05/23/2014  . S/P shoulder surgery 12/24/2012  . S/P arthroscopy of shoulder 12/16/2012  . Shoulder arthritis 12/13/2012  . Biceps tendon rupture, proximal 12/13/2012  . Partial tear of right subscapularis tendon 12/13/2012  . Hypertension 05/10/2011  . Hyperlipidemia 05/10/2011  . Barrett esophagus 05/10/2011   PHYSICAL THERAPY DISCHARGE SUMMARY  Visits from Start of Care: 4  Current functional level related to goals / functional outcomes: Patient at high functional level with no further need for skilled PT services at this time    Remaining deficits: Mild gait and balance relation deviations, weakness    Education / Equipment: Patient advised to remain as active as possible in order to maintain strength and balance  Plan: Patient agrees to discharge.  Patient goals were partially met. Patient is being discharged due to being pleased with the current functional level.  ?????       Deniece Ree PT,  DPT [place] [address] [place], [place], [address] Phone: [phone]   Fax:  [phone]

## 2014-10-06 ENCOUNTER — Ambulatory Visit (HOSPITAL_COMMUNITY): Payer: Medicare Other | Admitting: Physical Therapy

## 2014-10-12 ENCOUNTER — Ambulatory Visit (HOSPITAL_COMMUNITY): Payer: Medicare Other

## 2014-10-14 ENCOUNTER — Ambulatory Visit (HOSPITAL_COMMUNITY): Payer: Medicare Other

## 2014-10-20 DIAGNOSIS — H16149 Punctate keratitis, unspecified eye: Secondary | ICD-10-CM | POA: Diagnosis not present

## 2014-10-26 DIAGNOSIS — N401 Enlarged prostate with lower urinary tract symptoms: Secondary | ICD-10-CM | POA: Diagnosis not present

## 2014-10-26 DIAGNOSIS — N138 Other obstructive and reflux uropathy: Secondary | ICD-10-CM | POA: Diagnosis not present

## 2014-10-26 DIAGNOSIS — R351 Nocturia: Secondary | ICD-10-CM | POA: Diagnosis not present

## 2014-10-27 DIAGNOSIS — N401 Enlarged prostate with lower urinary tract symptoms: Secondary | ICD-10-CM | POA: Diagnosis not present

## 2014-10-27 DIAGNOSIS — Z0000 Encounter for general adult medical examination without abnormal findings: Secondary | ICD-10-CM | POA: Diagnosis not present

## 2014-11-12 DIAGNOSIS — N401 Enlarged prostate with lower urinary tract symptoms: Secondary | ICD-10-CM | POA: Diagnosis not present

## 2014-11-12 DIAGNOSIS — Z125 Encounter for screening for malignant neoplasm of prostate: Secondary | ICD-10-CM | POA: Diagnosis not present

## 2014-11-12 DIAGNOSIS — Z0000 Encounter for general adult medical examination without abnormal findings: Secondary | ICD-10-CM | POA: Diagnosis not present

## 2014-11-12 DIAGNOSIS — I6789 Other cerebrovascular disease: Secondary | ICD-10-CM | POA: Diagnosis not present

## 2014-11-12 DIAGNOSIS — Z79899 Other long term (current) drug therapy: Secondary | ICD-10-CM | POA: Diagnosis not present

## 2014-11-12 DIAGNOSIS — E785 Hyperlipidemia, unspecified: Secondary | ICD-10-CM | POA: Diagnosis not present

## 2014-11-13 DIAGNOSIS — H43813 Vitreous degeneration, bilateral: Secondary | ICD-10-CM | POA: Diagnosis not present

## 2014-11-13 DIAGNOSIS — H5203 Hypermetropia, bilateral: Secondary | ICD-10-CM | POA: Diagnosis not present

## 2014-11-13 DIAGNOSIS — H52223 Regular astigmatism, bilateral: Secondary | ICD-10-CM | POA: Diagnosis not present

## 2014-11-13 DIAGNOSIS — H524 Presbyopia: Secondary | ICD-10-CM | POA: Diagnosis not present

## 2014-11-25 DIAGNOSIS — M9903 Segmental and somatic dysfunction of lumbar region: Secondary | ICD-10-CM | POA: Diagnosis not present

## 2014-11-25 DIAGNOSIS — I10 Essential (primary) hypertension: Secondary | ICD-10-CM | POA: Diagnosis not present

## 2014-11-25 DIAGNOSIS — M9902 Segmental and somatic dysfunction of thoracic region: Secondary | ICD-10-CM | POA: Diagnosis not present

## 2014-11-25 DIAGNOSIS — M5441 Lumbago with sciatica, right side: Secondary | ICD-10-CM | POA: Diagnosis not present

## 2014-11-25 DIAGNOSIS — M546 Pain in thoracic spine: Secondary | ICD-10-CM | POA: Diagnosis not present

## 2014-11-27 DIAGNOSIS — I10 Essential (primary) hypertension: Secondary | ICD-10-CM | POA: Diagnosis not present

## 2014-11-27 DIAGNOSIS — M545 Low back pain: Secondary | ICD-10-CM | POA: Diagnosis not present

## 2014-11-27 DIAGNOSIS — M9902 Segmental and somatic dysfunction of thoracic region: Secondary | ICD-10-CM | POA: Diagnosis not present

## 2014-11-27 DIAGNOSIS — M9903 Segmental and somatic dysfunction of lumbar region: Secondary | ICD-10-CM | POA: Diagnosis not present

## 2014-12-15 DIAGNOSIS — I6789 Other cerebrovascular disease: Secondary | ICD-10-CM | POA: Diagnosis not present

## 2014-12-15 DIAGNOSIS — I10 Essential (primary) hypertension: Secondary | ICD-10-CM | POA: Diagnosis not present

## 2014-12-21 ENCOUNTER — Other Ambulatory Visit: Payer: Self-pay | Admitting: Family Medicine

## 2014-12-21 ENCOUNTER — Telehealth: Payer: Self-pay

## 2014-12-21 DIAGNOSIS — M5136 Other intervertebral disc degeneration, lumbar region: Secondary | ICD-10-CM

## 2014-12-21 NOTE — Telephone Encounter (Signed)
Lf vm for Chrys Racer about pt needing clearance on plavix to be hold. Vm was left at [phone].

## 2014-12-22 NOTE — Telephone Encounter (Signed)
[place] from [facility] stated pt has not been schedule for injection yet. Pt will only get schedule once the clearance is sign. Rn explain that Dr. Erlinda Hong is in the hospital every other week seeing stroke hospital patients. Chrys Racer stated it can wait until Monday, and if is urgent case she will call.

## 2014-12-22 NOTE — Telephone Encounter (Signed)
error 

## 2014-12-30 NOTE — Telephone Encounter (Signed)
Clearance form fax to [place] fax three times and confirm three times.

## 2014-12-31 ENCOUNTER — Telehealth (HOSPITAL_COMMUNITY): Payer: Self-pay | Admitting: Interventional Radiology

## 2014-12-31 NOTE — Telephone Encounter (Signed)
Called pt, left VM for him to call back to schedule a f/u office visit following his stroke in 05/2014. JM

## 2015-01-05 ENCOUNTER — Other Ambulatory Visit (HOSPITAL_COMMUNITY): Payer: Self-pay | Admitting: Interventional Radiology

## 2015-01-05 DIAGNOSIS — I639 Cerebral infarction, unspecified: Secondary | ICD-10-CM

## 2015-01-11 ENCOUNTER — Other Ambulatory Visit: Payer: Medicare Other

## 2015-01-22 ENCOUNTER — Ambulatory Visit (HOSPITAL_COMMUNITY): Payer: Medicare Other

## 2015-01-22 DIAGNOSIS — M9903 Segmental and somatic dysfunction of lumbar region: Secondary | ICD-10-CM | POA: Diagnosis not present

## 2015-01-22 DIAGNOSIS — I10 Essential (primary) hypertension: Secondary | ICD-10-CM | POA: Diagnosis not present

## 2015-01-22 DIAGNOSIS — M545 Low back pain: Secondary | ICD-10-CM | POA: Diagnosis not present

## 2015-01-22 DIAGNOSIS — M9902 Segmental and somatic dysfunction of thoracic region: Secondary | ICD-10-CM | POA: Diagnosis not present

## 2015-01-26 ENCOUNTER — Other Ambulatory Visit: Payer: Self-pay | Admitting: Radiology

## 2015-01-26 ENCOUNTER — Ambulatory Visit (HOSPITAL_COMMUNITY)
Admission: RE | Admit: 2015-01-26 | Discharge: 2015-01-26 | Disposition: A | Discharge status: Home / Self Care | Payer: Medicare Other | Source: Ambulatory Visit | Attending: Interventional Radiology | Admitting: Interventional Radiology

## 2015-01-26 DIAGNOSIS — I6521 Occlusion and stenosis of right carotid artery: Secondary | ICD-10-CM | POA: Diagnosis not present

## 2015-01-26 DIAGNOSIS — N138 Other obstructive and reflux uropathy: Secondary | ICD-10-CM | POA: Diagnosis not present

## 2015-01-26 DIAGNOSIS — R351 Nocturia: Secondary | ICD-10-CM | POA: Diagnosis not present

## 2015-01-26 DIAGNOSIS — Z539 Procedure and treatment not carried out, unspecified reason: Secondary | ICD-10-CM | POA: Insufficient documentation

## 2015-01-26 DIAGNOSIS — I639 Cerebral infarction, unspecified: Secondary | ICD-10-CM

## 2015-01-26 DIAGNOSIS — N401 Enlarged prostate with lower urinary tract symptoms: Secondary | ICD-10-CM | POA: Diagnosis not present

## 2015-01-26 LAB — PLATELET INHIBITION P2Y12: Platelet Function  P2Y12: 242 [PRU] (ref 194–418)

## 2015-02-01 ENCOUNTER — Telehealth (HOSPITAL_COMMUNITY): Payer: Self-pay | Admitting: *Deleted

## 2015-02-03 ENCOUNTER — Other Ambulatory Visit: Payer: Self-pay | Admitting: Radiology

## 2015-02-03 DIAGNOSIS — I6529 Occlusion and stenosis of unspecified carotid artery: Secondary | ICD-10-CM

## 2015-02-06 ENCOUNTER — Emergency Department (HOSPITAL_COMMUNITY)
Admission: EM | Admit: 2015-02-06 | Discharge: 2015-02-06 | Disposition: A | Discharge status: Home / Self Care | Payer: Medicare Other | Attending: Emergency Medicine | Admitting: Emergency Medicine

## 2015-02-06 ENCOUNTER — Encounter (HOSPITAL_COMMUNITY): Payer: Self-pay

## 2015-02-06 ENCOUNTER — Emergency Department (HOSPITAL_COMMUNITY): Payer: Medicare Other

## 2015-02-06 DIAGNOSIS — R55 Syncope and collapse: Secondary | ICD-10-CM | POA: Insufficient documentation

## 2015-02-06 DIAGNOSIS — Z8673 Personal history of transient ischemic attack (TIA), and cerebral infarction without residual deficits: Secondary | ICD-10-CM | POA: Diagnosis not present

## 2015-02-06 DIAGNOSIS — K210 Gastro-esophageal reflux disease with esophagitis: Secondary | ICD-10-CM | POA: Diagnosis not present

## 2015-02-06 DIAGNOSIS — Z7902 Long term (current) use of antithrombotics/antiplatelets: Secondary | ICD-10-CM | POA: Diagnosis not present

## 2015-02-06 DIAGNOSIS — Z7982 Long term (current) use of aspirin: Secondary | ICD-10-CM | POA: Insufficient documentation

## 2015-02-06 DIAGNOSIS — M1990 Unspecified osteoarthritis, unspecified site: Secondary | ICD-10-CM | POA: Diagnosis not present

## 2015-02-06 DIAGNOSIS — R5383 Other fatigue: Secondary | ICD-10-CM | POA: Diagnosis not present

## 2015-02-06 DIAGNOSIS — Z87828 Personal history of other (healed) physical injury and trauma: Secondary | ICD-10-CM | POA: Insufficient documentation

## 2015-02-06 DIAGNOSIS — Z87891 Personal history of nicotine dependence: Secondary | ICD-10-CM | POA: Diagnosis not present

## 2015-02-06 DIAGNOSIS — Z79899 Other long term (current) drug therapy: Secondary | ICD-10-CM | POA: Diagnosis not present

## 2015-02-06 DIAGNOSIS — I10 Essential (primary) hypertension: Secondary | ICD-10-CM | POA: Insufficient documentation

## 2015-02-06 DIAGNOSIS — R410 Disorientation, unspecified: Secondary | ICD-10-CM | POA: Insufficient documentation

## 2015-02-06 DIAGNOSIS — H9190 Unspecified hearing loss, unspecified ear: Secondary | ICD-10-CM | POA: Diagnosis not present

## 2015-02-06 DIAGNOSIS — R402431 Glasgow coma scale score 3-8, in the field [EMT or ambulance]: Secondary | ICD-10-CM | POA: Diagnosis not present

## 2015-02-06 DIAGNOSIS — I959 Hypotension, unspecified: Secondary | ICD-10-CM | POA: Diagnosis not present

## 2015-02-06 DIAGNOSIS — I6521 Occlusion and stenosis of right carotid artery: Secondary | ICD-10-CM | POA: Diagnosis not present

## 2015-02-06 DIAGNOSIS — I6501 Occlusion and stenosis of right vertebral artery: Secondary | ICD-10-CM | POA: Diagnosis not present

## 2015-02-06 DIAGNOSIS — Z8639 Personal history of other endocrine, nutritional and metabolic disease: Secondary | ICD-10-CM | POA: Insufficient documentation

## 2015-02-06 LAB — CBC
HCT: 40.5 % (ref 39.0–52.0)
Hemoglobin: 13 g/dL (ref 13.0–17.0)
MCH: 27.3 pg (ref 26.0–34.0)
MCHC: 32.1 g/dL (ref 30.0–36.0)
MCV: 84.9 fL (ref 78.0–100.0)
PLATELETS: 202 10*3/uL (ref 150–400)
RBC: 4.77 MIL/uL (ref 4.22–5.81)
RDW: 14 % (ref 11.5–15.5)
WBC: 6.8 10*3/uL (ref 4.0–10.5)

## 2015-02-06 LAB — URINE MICROSCOPIC-ADD ON: SQUAMOUS EPITHELIAL / LPF: NONE SEEN

## 2015-02-06 LAB — TROPONIN I

## 2015-02-06 LAB — URINALYSIS, ROUTINE W REFLEX MICROSCOPIC
Bilirubin Urine: NEGATIVE
Glucose, UA: NEGATIVE mg/dL
Hgb urine dipstick: NEGATIVE
Ketones, ur: NEGATIVE mg/dL
LEUKOCYTES UA: NEGATIVE
NITRITE: NEGATIVE
PH: 7 (ref 5.0–8.0)
PROTEIN: 30 mg/dL — AB
Specific Gravity, Urine: 1.014 (ref 1.005–1.030)

## 2015-02-06 LAB — BASIC METABOLIC PANEL
Anion gap: 7 (ref 5–15)
BUN: 17 mg/dL (ref 6–20)
CALCIUM: 8.9 mg/dL (ref 8.9–10.3)
CO2: 29 mmol/L (ref 22–32)
Chloride: 106 mmol/L (ref 101–111)
Creatinine, Ser: 1.01 mg/dL (ref 0.61–1.24)
GFR calc Af Amer: >60 mL/min (ref >60)
Glucose, Bld: 111 mg/dL — ABNORMAL HIGH (ref 65–99)
Potassium: 3.8 mmol/L (ref 3.5–5.1)
SODIUM: 142 mmol/L (ref 135–145)

## 2015-02-06 LAB — CBG MONITORING, ED: Glucose-Capillary: 92 mg/dL (ref 65–99)

## 2015-02-06 MED ORDER — SODIUM CHLORIDE 0.9 % IV BOLUS (SEPSIS)
1000.0000 mL | Freq: Once | INTRAVENOUS | Status: AC
  Administered 2015-02-06: 1000 mL via INTRAVENOUS

## 2015-02-06 MED ORDER — IOHEXOL 350 MG/ML SOLN
80.0000 mL | Freq: Once | INTRAVENOUS | Status: AC | PRN
  Administered 2015-02-06: 80 mL via INTRAVENOUS

## 2015-02-06 NOTE — ED Provider Notes (Signed)
CSN: [id number]     Arrival date & time 02/06/15  [id number] History   First MD Initiated Contact with Patient 02/06/15 0840     Chief Complaint  Patient presents with  . Loss of Consciousness     (Consider location/radiation/quality/duration/timing/severity/associated sxs/prior Treatment) Patient is a 80 y.o. male presenting with syncope. The history is provided by the patient.  Loss of Consciousness Episode history:  Single Most recent episode:  Today Duration:  20 minutes (of total time with symptoms, lethargic with responsiveness for most of this period of time) Timing:  Constant Progression:  Resolved Chronicity:  Recurrent Context: normal activity   Witnessed: yes   Relieved by:  Certain positions Worsened by:  Nothing tried Ineffective treatments:  None tried Associated symptoms: confusion and malaise/fatigue   Associated symptoms: no difficulty breathing and no vomiting     Past Medical History  Diagnosis Date  . Hypertension   . GERD (gastroesophageal reflux disease)   . Hypercholesteremia   . HOH (hard of hearing)   . Stroke [facility])     had a stroke 05/23/2014  . Hx-TIA (transient ischemic attack)     hx of this- wife states 10 years ago-but Dr. Estil Daft told wife he had had several-has 2 stents in carotid artery-left  . FOOT SPRAIN, RIGHT 07/18/2007    Qualifier: Diagnosis of  By: Aline Brochure MD, [name]    . Esophagitis 05/10/2011  . Accelerated hypertension 06/29/2014  . Arthritis     Back  . Foley catheter in place    Past Surgical History  Procedure Laterality Date  . Shoulder surgery Right     torn cartiledge  . Esophagogastroduodenoscopy  10/28/2010    Procedure: ESOPHAGOGASTRODUODENOSCOPY (EGD);  Surgeon: [name], MD;  Location: [facility];  Service: Endoscopy;  Laterality: N/A;  10:30  . Colonoscopy    . Colonoscopy  05/17/2011    Procedure: COLONOSCOPY;  Surgeon: [name], MD;  Location: [facility];  Service: Endoscopy;  Laterality: N/A;   830  . Resection distal clavical Right 12/13/2012    Procedure: RESECTION DISTAL CLAVICAL;  Surgeon: Carole Civil, MD;  Location: [facility];  Service: Orthopedics;  Laterality: Right;  . Shoulder arthroscopy with bicepstenotomy Right 12/13/2012    Procedure: SHOULDER ARTHROSCOPY WITH BICEPS TENOTOMY, limited shoulder debridement;  Surgeon: Carole Civil, MD;  Location: [facility];  Service: Orthopedics;  Laterality: Right;  . Esophagogastroduodenoscopy N/A 04/24/2014    Procedure: ESOPHAGOGASTRODUODENOSCOPY (EGD);  Surgeon: [name], MD;  Location: [facility];  Service: Endoscopy;  Laterality: N/A;  815  . Radiology with anesthesia N/A 05/23/2014    Procedure: RADIOLOGY WITH ANESTHESIA;  Surgeon: Medication Radiologist, MD;  Location: [place];  Service: Radiology;  Laterality: N/A;  . Epidural steroid injection  05/22/2014    had this in lumbar and at 1 am on 05/23/2014 had a stroke  . Carotid stents  05/2014    2 stents in carotid artery on left side  . Shoulder surgery      right shoulder  . Eus N/A 06/26/2014    Procedure: FULL UPPER ENDOSCOPIC ULTRASOUND (EUS) RADIAL;  Surgeon: [name], MD;  Location: [facility];  Service: Endoscopy;  Laterality: N/A;  . Esophagogastroduodenoscopy N/A 06/26/2014    Procedure: ESOPHAGOGASTRODUODENOSCOPY (EGD);  Surgeon: [name], MD;  Location: [facility];  Service: Endoscopy;  Laterality: N/A;  . Transurethral resection of prostate N/A 07/09/2014    Procedure: TRANSURETHRAL RESECTION of prostate WITH BUTTON ;  Surgeon: Jenny Reichmann  Jeffie Pollock, MD;  Location: [facility];  Service: Urology;  Laterality: N/A;   Family History  Problem Relation Age of Onset  . Colon cancer Neg Hx    Social History  Substance Use Topics  . Smoking status: Former Smoker -- 0.50 packs/day for 35 years    Types: Cigarettes    Quit date: 12/10/1994  . Smokeless tobacco: None  . Alcohol Use: 1.8 oz/week    3 Glasses of wine per week     Comment: occassionally     Review of Systems  Constitutional: Positive for malaise/fatigue.  Cardiovascular: Positive for syncope.  Gastrointestinal: Negative for vomiting.  Psychiatric/Behavioral: Positive for confusion.  All other systems reviewed and are negative.     Allergies  Review of patient's allergies indicates no known allergies.  Home Medications   Prior to Admission medications   Medication Sig Start Date End Date Taking? Authorizing Provider  ALPRAZolam Duanne Moron) 0.5 MG tablet Take 1 tablet (0.5 mg total) by mouth 3 (three) times daily as needed for anxiety. 06/05/14   Lavon Paganini Angiulli, PA-C  amLODipine (NORVASC) 5 MG tablet Take 1 tablet (5 mg total) by mouth at bedtime. 07/07/14   [name], MD  aspirin 81 MG tablet Take 81 mg by mouth daily.    Historical Provider, MD  atorvastatin (LIPITOR) 40 MG tablet Take 1 tablet (40 mg total) by mouth every evening. 06/05/14   Lavon Paganini Angiulli, PA-C  clopidogrel (PLAVIX) 75 MG tablet Take 1 tablet (75 mg total) by mouth daily. 09/08/14   Rosalin Hawking, MD  esomeprazole (NEXIUM) 40 MG capsule Take 40 mg by mouth every morning.     Historical Provider, MD  finasteride (PROSCAR) 5 MG tablet Take 1 tablet (5 mg total) by mouth daily. 06/03/14   [name], MD   BP 123/109 mmHg  Pulse 80  Temp(Src) 97.6 F (36.4 C) (Oral)  Resp 22  Ht 5\' 8"  (1.727 m)  Wt 210 lb (95.255 kg)  BMI 31.94 kg/m2  SpO2 100% Physical Exam  Constitutional: He is oriented to person, place, and time. He appears well-developed and well-nourished. No distress.  HENT:  Head: Normocephalic and atraumatic.  Eyes: Conjunctivae and EOM are normal. Pupils are equal, round, and reactive to light.  Neck: Neck supple. No tracheal deviation present.  Cardiovascular: Normal rate and regular rhythm.   Pulmonary/Chest: Effort normal. No respiratory distress.  Abdominal: Soft. He exhibits no distension.  Neurological: He is alert and oriented to person, place, and time. He has normal  strength. He displays no tremor. No cranial nerve deficit. He displays no seizure activity. Coordination and gait normal. GCS eye subscore is 4. GCS verbal subscore is 5. GCS motor subscore is 6.  Normal finger to nose testing and rapid alternating movement, normal heel-to-shin.  Skin: Skin is warm and dry.  Psychiatric: He has a normal mood and affect.    ED Course  Procedures (including critical care time) Labs Review Labs Reviewed  BASIC METABOLIC PANEL - Abnormal; Notable for the following:    Glucose, Bld 111 (*)    All other components within normal limits  URINALYSIS, ROUTINE W REFLEX MICROSCOPIC (NOT AT [facility]) - Abnormal; Notable for the following:    APPearance CLOUDY (*)    Protein, ur 30 (*)    All other components within normal limits  URINE MICROSCOPIC-ADD ON - Abnormal; Notable for the following:    Bacteria, UA RARE (*)    Casts HYALINE CASTS (*)    All  other components within normal limits  CBC  TROPONIN I  CBG MONITORING, ED    Imaging Review Ct Angio Head W/cm &/or Wo Cm  02/06/2015  CLINICAL DATA:  Syncope EXAM: CT ANGIOGRAPHY HEAD AND NECK TECHNIQUE: Multidetector CT imaging of the head and neck was performed using the standard protocol during bolus administration of intravenous contrast. Multiplanar CT image reconstructions and MIPs were obtained to evaluate the vascular anatomy. Carotid stenosis measurements (when applicable) are obtained utilizing NASCET criteria, using the distal internal carotid diameter as the denominator. CONTRAST:  28mL OMNIPAQUE IOHEXOL 350 MG/ML SOLN COMPARISON:  CT head 05/23/2014 FINDINGS: CT HEAD Brain: Moderate to advanced atrophy. Advanced chronic microvascular ischemic changes, left greater than right. Negative for acute infarct. Negative for hemorrhage or mass. Calvarium and skull base: Negative Paranasal sinuses: Mild mucosal thickening in the paranasal sinuses without air-fluid level. Orbits: Negative CTA NECK Aortic arch:  Atherosclerotic aortic arch which is diffusely calcified. No aneurysm or dissection. 3 vessel aortic arch. Atherosclerotic calcification in the proximal great vessels without significant stenosis. Lung apices clear. Right carotid system: Atherosclerotic calcification in the distal right common carotid artery narrowing the lumen by 50% diameter stenosis. Atherosclerotic calcification in the right carotid bulb narrowing the lumen by 50% diameter stenosis. Right external carotid artery widely patent. Left carotid system: Left carotid stent extending from the common carotid to the internal carotid artery is patent. Mild narrowing of the lumen in the distal stent. Vertebral arteries:Calcified plaque and moderate stenosis the origin of the right vertebral artery. Scattered atherosclerotic calcification in the right vertebral artery. Left vertebral artery patent. Both vertebral arteries contribute to the basilar. Skeleton: Mild cervical spine degenerative change. No fracture or bone lesion. Other neck: 1 cm left thyroid cyst. CTA HEAD Anterior circulation: Cavernous carotid widely patent bilaterally with minimal atherosclerotic disease. Anterior and middle cerebral arteries patent bilaterally without stenosis or occlusion. Posterior circulation: Both vertebral arteries are patent to the basilar. PICA patent bilaterally. Basilar patent. Superior cerebellar and posterior cerebral arteries patent bilaterally without stenosis. Venous sinuses: Patent Anatomic variants: Negative for cerebral aneurysm. Delayed phase: No enhancing lesion on delayed imaging IMPRESSION: Moderate to advanced atrophy and chronic microvascular ischemia. No acute intracranial abnormality Left carotid stent is patent. 50% diameter stenosis right common carotid artery before the bifurcation. 50% diameter stenosis due to calcified plaque of the proximal right internal carotid artery. Moderate stenosis origin right vertebral artery. No significant  intracranial stenosis. Electronically Signed   By: Franchot Gallo M.D.   On: 02/06/2015 11:41   Ct Angio Neck W/cm &/or Wo/cm  02/06/2015  CLINICAL DATA:  Syncope EXAM: CT ANGIOGRAPHY HEAD AND NECK TECHNIQUE: Multidetector CT imaging of the head and neck was performed using the standard protocol during bolus administration of intravenous contrast. Multiplanar CT image reconstructions and MIPs were obtained to evaluate the vascular anatomy. Carotid stenosis measurements (when applicable) are obtained utilizing NASCET criteria, using the distal internal carotid diameter as the denominator. CONTRAST:  51mL OMNIPAQUE IOHEXOL 350 MG/ML SOLN COMPARISON:  CT head 05/23/2014 FINDINGS: CT HEAD Brain: Moderate to advanced atrophy. Advanced chronic microvascular ischemic changes, left greater than right. Negative for acute infarct. Negative for hemorrhage or mass. Calvarium and skull base: Negative Paranasal sinuses: Mild mucosal thickening in the paranasal sinuses without air-fluid level. Orbits: Negative CTA NECK Aortic arch: Atherosclerotic aortic arch which is diffusely calcified. No aneurysm or dissection. 3 vessel aortic arch. Atherosclerotic calcification in the proximal great vessels without significant stenosis. Lung apices clear. Right carotid system: Atherosclerotic  calcification in the distal right common carotid artery narrowing the lumen by 50% diameter stenosis. Atherosclerotic calcification in the right carotid bulb narrowing the lumen by 50% diameter stenosis. Right external carotid artery widely patent. Left carotid system: Left carotid stent extending from the common carotid to the internal carotid artery is patent. Mild narrowing of the lumen in the distal stent. Vertebral arteries:Calcified plaque and moderate stenosis the origin of the right vertebral artery. Scattered atherosclerotic calcification in the right vertebral artery. Left vertebral artery patent. Both vertebral arteries contribute to the  basilar. Skeleton: Mild cervical spine degenerative change. No fracture or bone lesion. Other neck: 1 cm left thyroid cyst. CTA HEAD Anterior circulation: Cavernous carotid widely patent bilaterally with minimal atherosclerotic disease. Anterior and middle cerebral arteries patent bilaterally without stenosis or occlusion. Posterior circulation: Both vertebral arteries are patent to the basilar. PICA patent bilaterally. Basilar patent. Superior cerebellar and posterior cerebral arteries patent bilaterally without stenosis. Venous sinuses: Patent Anatomic variants: Negative for cerebral aneurysm. Delayed phase: No enhancing lesion on delayed imaging IMPRESSION: Moderate to advanced atrophy and chronic microvascular ischemia. No acute intracranial abnormality Left carotid stent is patent. 50% diameter stenosis right common carotid artery before the bifurcation. 50% diameter stenosis due to calcified plaque of the proximal right internal carotid artery. Moderate stenosis origin right vertebral artery. No significant intracranial stenosis. Electronically Signed   By: Franchot Gallo M.D.   On: 02/06/2015 11:41   I have personally reviewed and evaluated these images and lab results as part of my medical decision-making.   EKG Interpretation   Date/Time:  Saturday February 06 2015 08:46:44 EST Ventricular Rate:  70 PR Interval:  187 QRS Duration: 101 QT Interval:  406 QTC Calculation: 438 R Axis:   -6 Text Interpretation:  Sinus rhythm Low voltage, precordial leads No  significant change since last tracing Confirmed by Beryle Bagsby MD, Janis Sol  [id number]) on 02/06/2015 8:56:51 AM      MDM   Final diagnoses:  Syncope, unspecified syncope type  Syncope  Syncope    80 y.o. male presents with syncopal episode at the breakfast table where he went face down. He was still responsive during the episode but d/t its prolonged nature EMS was called and revived patient after sitting him up. Has h/o carotid stenting  status post thrombectomy on left after acute CVA that presented with syncope and then right sided weakness. Symptoms have completely resolved prior to arrival. Normal neurologic exam here. No significant hematologic or metabolic abnormalities to explain symptoms. Discussed case with o/c neurologist regarding imaging recommendations to r/o ischemia. He recommended CTA to evaluate carotids and cerebral vasculature and patient may be discharged with routine follow up if negative. Stent is patent on left, known 50% stenosis on right without acute lesion noted. Pt ambulated without assistance. Negative by SF criteria for high risk episode. EKG interpreted by me without ST or T wave changes concerning for myocardial ischemia. No delta wave, no prolonged QTc, no brugada to suggest arrhythmogenicity. Discussed results with the family and Pt. He is due to have carotid dopplers performed soon and will follow up for this and with his neurologist. Plan to follow up with PCP as needed and return precautions discussed for worsening or new concerning symptoms.    Leo Grosser, MD 02/07/15 [phone]

## 2015-02-06 NOTE — ED Notes (Addendum)
Pt returned to unit from CT.

## 2015-02-06 NOTE — ED Notes (Signed)
Per EMS, Family found pt in kitchen chair unresponsive, drooling, with no radial pulses. Pt has HX of Stroke with TIA recently. When EMS arrived, pt was still unresponsive with HR 40. About three minutes into assessment, pt began to become awake and alert. BY end of patient transport, pt was alert. Initial BP 101/51 Sitting. Orthostatic was positive with 70/46. No new syncopal episode with BP decrease. Pt has equal grip strength and passed stroke screen. Vitals per EMS: 140/90 Supine, 152 CBG, SB Starting at 40-60, 20 Gauge IV in L AC, Given 250 ml of NS during transport. Denies any chest pain, SOB, or any symptoms.

## 2015-02-06 NOTE — Discharge Instructions (Signed)

## 2015-02-09 ENCOUNTER — Ambulatory Visit (HOSPITAL_COMMUNITY)
Admission: RE | Admit: 2015-02-09 | Discharge: 2015-02-09 | Disposition: A | Discharge status: Home / Self Care | Payer: Medicare Other | Source: Ambulatory Visit | Attending: Radiology | Admitting: Radiology

## 2015-02-09 DIAGNOSIS — I6529 Occlusion and stenosis of unspecified carotid artery: Secondary | ICD-10-CM | POA: Insufficient documentation

## 2015-02-09 NOTE — Progress Notes (Signed)
VASCULAR LAB PRELIMINARY  PRELIMINARY  PRELIMINARY  PRELIMINARY  Bilateral carotid duplex completed. Right 40-59% internal carotid artery stenosis due to end diastolic. Left Post carotid stent 50%-75% internal carotid artery. Vertebral antegraded bilaterally.     Jem Castro, RVT, RDMS 02/09/2015, 1:54 PM

## 2015-02-15 ENCOUNTER — Other Ambulatory Visit: Payer: Self-pay | Admitting: Neurology

## 2015-02-17 DIAGNOSIS — M545 Low back pain: Secondary | ICD-10-CM | POA: Diagnosis not present

## 2015-02-17 DIAGNOSIS — M9903 Segmental and somatic dysfunction of lumbar region: Secondary | ICD-10-CM | POA: Diagnosis not present

## 2015-02-17 DIAGNOSIS — M9902 Segmental and somatic dysfunction of thoracic region: Secondary | ICD-10-CM | POA: Diagnosis not present

## 2015-02-17 DIAGNOSIS — M546 Pain in thoracic spine: Secondary | ICD-10-CM | POA: Diagnosis not present

## 2015-02-18 DIAGNOSIS — I10 Essential (primary) hypertension: Secondary | ICD-10-CM | POA: Diagnosis not present

## 2015-02-18 DIAGNOSIS — Z8673 Personal history of transient ischemic attack (TIA), and cerebral infarction without residual deficits: Secondary | ICD-10-CM | POA: Diagnosis not present

## 2015-02-18 DIAGNOSIS — R55 Syncope and collapse: Secondary | ICD-10-CM | POA: Diagnosis not present

## 2015-02-18 DIAGNOSIS — E7800 Pure hypercholesterolemia, unspecified: Secondary | ICD-10-CM | POA: Diagnosis not present

## 2015-02-26 DIAGNOSIS — R55 Syncope and collapse: Secondary | ICD-10-CM | POA: Diagnosis not present

## 2015-02-26 DIAGNOSIS — I10 Essential (primary) hypertension: Secondary | ICD-10-CM | POA: Diagnosis not present

## 2015-03-03 ENCOUNTER — Telehealth: Payer: Self-pay | Admitting: *Deleted

## 2015-03-03 NOTE — Telephone Encounter (Signed)
I spoke to wife.   Pt on Christmas Eve, passed out, went to ED, had ultrasound and Dr. Estanislado Pandy made referral to Dr. Einar Gip.  Pt has had cardiac event monitor and stress test and appt on 04-04-15.  Will keep appt with Dr. Erlinda Hong on 03-08-15 at 1200.  Wife verbalized understanding.

## 2015-03-03 NOTE — Telephone Encounter (Signed)
Esaw Dace returned Henry [name]'s call, please call back to [phone].

## 2015-03-08 ENCOUNTER — Ambulatory Visit: Payer: Medicare Other | Admitting: Neurology

## 2015-03-08 ENCOUNTER — Encounter: Payer: Self-pay | Admitting: Neurology

## 2015-03-08 ENCOUNTER — Ambulatory Visit (INDEPENDENT_AMBULATORY_CARE_PROVIDER_SITE_OTHER): Payer: Medicare Other | Admitting: Neurology

## 2015-03-08 VITALS — BP 158/72 | HR 68 | Ht 68.0 in | Wt 217.2 lb

## 2015-03-08 DIAGNOSIS — I951 Orthostatic hypotension: Secondary | ICD-10-CM | POA: Insufficient documentation

## 2015-03-08 DIAGNOSIS — I6523 Occlusion and stenosis of bilateral carotid arteries: Secondary | ICD-10-CM | POA: Diagnosis not present

## 2015-03-08 DIAGNOSIS — I63412 Cerebral infarction due to embolism of left middle cerebral artery: Secondary | ICD-10-CM | POA: Diagnosis not present

## 2015-03-08 DIAGNOSIS — R55 Syncope and collapse: Secondary | ICD-10-CM | POA: Insufficient documentation

## 2015-03-08 NOTE — Progress Notes (Addendum)
STROKE NEUROLOGY FOLLOW UP NOTE  NAME: [name] DOB: [birth year]  REASON FOR VISIT: stroke follow up HISTORY FROM: chart and pt and wife  Today we had the pleasure of seeing Henry [name] in follow-up at our Neurology Clinic. Pt was accompanied by wife.   History Summary Henry [name] is an 81 y.o. male hx of HTN, HLD presenting to [facility] with syncopal episode on 05/23/2014. Upon arrival found to be aphasic with right sided weakness. NIHSS = 15. Outside IV tPA window by time of arrival. A CTA showed left ICA proximal occlusion and the patient was transferred to [facility] for possible IR intervention. CT perfusion study showed large penumbra, he was taking to IR, and mechanical thrombectomy was performed. Left ICA and MCA recannulized, and left ICA stenting was performed. MRI showed left MCA territory infarct, and MRI showed wide patent left ICA and MCA. Carotid Doppler showed left ICA stent patent, and right ICA 50-60% stenosis. 2-D echo unremarkable. LDL 59 and A1c 6.2.  LV DVT negative. He was extubated on 05/25/2014 and his right hemiparesis improved nicely, however he still has severe dysarthria and aphasia. He passed swallow on discharge, was put on pured diet. Hwas put on ASA and Plavix for 3 months, and then Plavix monotherapy lifetime. He was discharged to CIR in good condition.  Follow up 06/29/14 - the patient has been doing well from a neurological standpoint. He was discharged from [facility] on 06/04/2014. His speech much improved without dysarthria or aphasia. However he developed hematuria and the urinary retention in rehabilitation and foley catheter was placed and urology consult was requested. He continued to follow up with urology after discharge and went to ER twice for urinary retention, so far still with foley catheter and was found to have UTI. He is going to start Abx and will see urology next week for cystoscopy. He also had MRCP last week for questionable pancreas lesion  and was cleared by GI. For the procedures, he was currently only on ASA 81 and plavix on hold. Wife concerns that if he is on dural antiplatelet, he will have hematuria again. His BP was high today 188/88, but wife said at home he only at 140-160. Pt is somehow agitated by the foley catheter in the clinic today.  Follow up 08/28/14 - he was doing well without recurrent symptoms. He had cystoscopy and prostate procedure and hematuria resolved. He is still on dural antiplatelet now. He had repeat CUS in 07/2014 showed right ICA 50-69% and left ICA patent with stent. He has no complains. Finished speech therapy and going to have PT to help him "go back to golf course". BP today in clinic 170/81 but at home per wife is always 130s.   Interval History During the interval time, he has been doing well except syncope episode on 02/06/15. He finished breakfast and sitting in chair. He tried to get up for medication but he felt not well, able to pass out and he sat down and then passed out as observed by his wife. EMS on arrival BP 100/40 and HR 40s. He was put on stretcher and sent to [facility] ED. On ED arrival, he back to baseline. BP at 140s, however on standing up BP down to 90s but no symptoms. He had CTA showed left carotid stent patent, right carotid 50% stenosis. He was discharged with outpatient carotid Doppler as well as cardiology follow-up. He had carotid Doppler on 02/09/2015 showed left stent patent with 50-75%  stenosis (likely due to high velocity with stenting) and right 40-59% stenosis. He also follows with Dr. Einar Gip, had a stress test and 30 day cardiac monitoring, results pending. Since then, he has been doing well, no recurrent episode. He also had follow-up on 02/01/2015 with Dr.Deveshwar, put back on DAPT and recommend further carotid doppler monitoring. BP today 158/72 in clinic. He stated that at home his blood pressure always around 145/73.  REVIEW OF SYSTEMS: Full 14 system review of systems performed  and notable only for those listed below and in HPI above, all others are negative:  Constitutional:    Cardiovascular:  Ear/Nose/Throat:  Hearing loss Skin:  Eyes:  Eye itching Respiratory:   Gastroitestinal:  Constipation Genitourinary:  Frequent urination  Hematology/Lymphatic:   easy bruising, easy bleeding  Endocrine:  Cold intolerance Musculoskeletal:  Back pain Allergy/Immunology:   Neurological:   memory loss, speech difficulty  Psychiatric:  Sleep:  Insomnia   The following represents the patient's updated allergies and side effects list: No Known Allergies  The neurologically relevant items on the patient's problem list were reviewed on today's visit.  Neurologic Examination  A problem focused neurological exam (12 or more points of the single system neurologic examination, vital signs counts as 1 point, cranial nerves count for 8 points) was performed.  Blood pressure 158/72, pulse 68, height 5\' 8"  (1.727 m), weight 217 lb 3.2 oz (98.521 kg).   Orthostatic vitals: Lying 177/84, sitting 162/88, standing 1 minute 167/87, 3 minutes 143/85  General - Well nourished, well developed, in mild distress due to foley catheter.  Ophthalmologic - Fundi not visualized due to incorporation  Cardiovascular - Regular rate and rhythm with no murmur.  Mental Status -  Level of arousal and orientation to time, place, and person were intact. Language including expression, naming, repetition, comprehension was assessed and found intact. No aphasia.  Cranial Nerves II - XII - II - Visual field intact OU. III, IV, VI - Extraocular movements intact. V - Facial sensation intact bilaterally. VII - Facial movement intact bilaterally. VIII - Hearing & vestibular intact bilaterally. X - Palate elevates symmetrically. XI - Chin turning & shoulder shrug intact bilaterally. XII - Tongue protrusion intact.  Motor Strength - The patient's strength was normal in all extremities and pronator  drift was absent.  Bulk was normal and fasciculations were absent.   Motor Tone - Muscle tone was assessed at the neck and appendages and was normal.  Reflexes - The patient's reflexes were 1+ in all extremities and he had no pathological reflexes.  Sensory - Light touch, temperature/pinprick were were assessed and were normal.    Coordination - The patient had normal movements in the hands and feet with no ataxia or dysmetria.  Tremor was absent.  Gait and Station - The patient's transfers, posture, gait, station, and turns were observed as normal.  Data reviewed: I personally reviewed the images and agree with the radiology interpretations.  Ct Angio Head W/cm &/or Wo Cm  05/23/2014 IMPRESSION: 1. Nonvisualization of the left internal carotid artery, occluded proximally within the neck. There is distal reconstitution at the supraclinoid segment of the left ICA via contralateral flow across the circle of Willis. Flow within the left M1 segment is present but somewhat thready and irregular in appearance, which may in part be related to motion artifact on this exam. There is question of a superimposed focal moderate to severe stenosis and/or partially occlusive thrombus within the distal left M1/ proximal M2 segment near  the base of the sylvian fissure. CTA imaging of the neck is suggested to evaluate the extent of the left ICA occlusion. 2. Widely patent right internal carotid artery system. 3. Widely patent vertebrobasilar system. A small left posterior communicating artery is present.   Ct Head Wo Contrast  05/23/2014 IMPRESSION: Post angiography without evidence of intracranial hemorrhage or intracranial enhancing lesion. Indistinctness left subinsular region, left basal ganglia and portions of the left corona radiata/left frontal lobe may represent result of acute infarct. Prominent small vessel disease type changes.   05/23/2014 IMPRESSION: 1. No acute intracranial abnormality or  significant interval change. 2. Stable atrophy and diffuse white matter disease. This likely reflects the sequela of chronic microvascular ischemia.   Ct Cerebral Perfusion W/cm  05/23/2014 IMPRESSION: 1. Area of ischemic core infarct within the left MCA territory, most prevalent within the anterior left frontal lobe. 2. Adjacent areas of elevated time to peak with relatively preserved CBV in the left MCA territory. Finding suspicious for ischemic penumbra. Size of ischemic penumbra overall approximately 1/3 the size of affected area.   Dg Chest Port 1 View  05/25/2014 IMPRESSION: Endotracheal tube similar to comparison, terminating approximately 2.9 cm above the carina. Unchanged gastric tube. Low lung volumes, with airspace and interstitial opacities potentially reflecting atelectasis and/ or consolidation. Widened mediastinum, accentuated by the rotation of the patient. If there were concern for acute mediastinal or vascular abnormality, CT would be advised.   Dg Chest Port 1 View  05/23/2014 IMPRESSION: Central mild vascular congestion and mild perihilar increased bronchial markings. No convincing pulmonary edema. Mild basilar atelectasis without segmental infiltrate. Endotracheal tube in place with tip 3.3 cm above the carina. NG tube in place.   Mri and Mra Head/brain Wo Cm  05/24/2014 IMPRESSION: MRI HEAD: Multi small areas of acute ischemia LEFT middle cerebral artery territory. Small vascular flow void, less likely micro hemorrhage LEFT posterior frontal lobe without lobar hematoma. Involutional changes. Moderate to severe white matter changes suggest chronic small vessel ischemic disease. Findings suggests of normal pressure hydrocephalus. MRA HEAD: Recannulized LEFT internal carotid artery. Normal MRA of the head, complete circle of Willis.   CTA head and neck 02/06/2015 Moderate to advanced atrophy and chronic microvascular ischemia. No acute intracranial  abnormality Left carotid stent is patent. 50% diameter stenosis right common carotid artery before the bifurcation. 50% diameter stenosis due to calcified plaque of the proximal right internal carotid artery. Moderate stenosis origin right vertebral artery. No significant intracranial stenosis.  Carotid Doppler 05/24/2014 Right: ECA stenosis. 40-59% internal carotid artery stenosis by velocities. But ICA/CCA ratio of 3.07 suggests stenosis in the 60-79% range. Left: <50% stenosis in the stent. Bilateral: Vertebral artery flow is antegrade. Carotid doppler 07/21/14 - right ICA 50-69% stenosis, left ICA s/p stent and patent. Carotid Doppler 02/09/2015 - Right 40-59% internal carotid artery stenosis due to end diastolic. Left Post carotid stent 50%-75% internal carotid artery. Vertebral antegraded bilaterally.   2D Echocardiogram - Normal LV size with mild LV hypertrophy. EF 65-70%. Normal RV size and systolic function. No significant valvular abnormalities.  LE venous doppler - negative for DVT  Component     Latest Ref Rng 05/24/2014  Cholesterol     0 - 200 mg/dL 110  Triglycerides     <150 mg/dL 79  HDL Cholesterol     >39 mg/dL 35 (L)  Total CHOL/HDL Ratio      3.1  VLDL     0 - 40 mg/dL 16  LDL (calc)  0 - 99 mg/dL 59  Hemoglobin A1C     4.8 - 5.6 % 6.2 (H)  Mean Plasma Glucose      131    Assessment: As you may recall, he is a 81 y.o. Caucasian male with PMH of HTN, HLD was admitted on 05/23/14 for left MCA stroke due to left ICA and MCA tandem occlusion. IV tPA not given due to out of window but IR performed mechanical thrombectomy and achieved recannulization of left ICA and MCA. Left ICA stenting was done also. His language and right-sided hemiparesis recovered remarkably and the current examination showed no significant neuro deficit. He was put on aspirin and Plavix dural antiplatelet and discharged to CIR. However, during the interval time, he developed hematuria,  urinary retention, UTI, and was put on foley catheter. Had cystoscopy and prostate procedure and hematuria resolved. He also had MRCP for questionable pancreas lesion but was cleared by GI. Repeat carotid Doppler showed patent left ICA stent and right ICA 50-69%. Finished 3 months of DAPT and continued on Plavix. Follow up with Dr. Estanislado Pandy in 01/30/15 and put back on DAPT. Had syncope episode due to orthostatic hypotension on 02/06/2015, repeat CUS shows stable right ICA 40-59% stenosis and CTA head and neck showed right ICA 50% stenosis and left ICA stent patent.  Plan:  - continue ASA and plavix and lipitor for stroke prevention for now. If CAD will continue DAPT.  - follow up with Dr Einar Gip and pending stress test and 30 day cardiac event monitoring.  - stand up slow and keep hydrated. - BP goal 120-150 due to carotid stenosis. - Patient was educated on lying down immediately with feeling of orthostatic hypotension. - Repeat carotid doppler in 6 months. - check BP at home and record. - Follow up with your primary care physician for stroke risk factor modification. Recommend maintain blood pressure goal 120-150, diabetes with hemoglobin A1c goal below 6.5% and lipids with LDL cholesterol goal below 70 mg/dL.  - follow up in 6 months.  I spent more than 25 minutes of face to face time with the patient. Greater than 50% of time was spent in counseling and coordination of care. We have discussed about management of orthostatic hypotension, continued carotid artery monitoring.   No orders of the defined types were placed in this encounter.    No orders of the defined types were placed in this encounter.    Patient Instructions  - continue ASA and plavix and lipitor for stroke prevention for now. - follow up with Dr Einar Gip for stress test and heart monitoring. - stand up slow and keep hydrated - if you feels lightheadedness and about to pass out, lie down immediately.   - will repeat  carotid doppler in 6 months. - check BP at home and record. - Follow up with your primary care physician for stroke risk factor modification. Recommend maintain blood pressure goal <130/80, diabetes with hemoglobin A1c goal below 6.5% and lipids with LDL cholesterol goal below 70 mg/dL.  - follow up in 6 months.    Rosalin Hawking, MD PhD [facility] [address] [place], [place] [address] [phone]

## 2015-03-08 NOTE — Patient Instructions (Signed)
-   continue ASA and plavix and lipitor for stroke prevention for now. - follow up with Dr Einar Gip for stress test and heart monitoring. - stand up slow and keep hydrated - if you feels lightheadedness and about to pass out, lie down immediately.   - will repeat carotid doppler in 6 months. - check BP at home and record. - Follow up with your primary care physician for stroke risk factor modification. Recommend maintain blood pressure goal <130/80, diabetes with hemoglobin A1c goal below 6.5% and lipids with LDL cholesterol goal below 70 mg/dL.  - follow up in 6 months.

## 2015-03-19 DIAGNOSIS — R55 Syncope and collapse: Secondary | ICD-10-CM | POA: Diagnosis not present

## 2015-03-22 DIAGNOSIS — I10 Essential (primary) hypertension: Secondary | ICD-10-CM | POA: Diagnosis not present

## 2015-03-22 DIAGNOSIS — E782 Mixed hyperlipidemia: Secondary | ICD-10-CM | POA: Diagnosis not present

## 2015-03-22 DIAGNOSIS — R531 Weakness: Secondary | ICD-10-CM | POA: Diagnosis not present

## 2015-03-22 DIAGNOSIS — R4781 Slurred speech: Secondary | ICD-10-CM | POA: Diagnosis not present

## 2015-03-29 DIAGNOSIS — M545 Low back pain: Secondary | ICD-10-CM | POA: Diagnosis not present

## 2015-03-29 DIAGNOSIS — M9902 Segmental and somatic dysfunction of thoracic region: Secondary | ICD-10-CM | POA: Diagnosis not present

## 2015-03-29 DIAGNOSIS — M9903 Segmental and somatic dysfunction of lumbar region: Secondary | ICD-10-CM | POA: Diagnosis not present

## 2015-03-29 DIAGNOSIS — M546 Pain in thoracic spine: Secondary | ICD-10-CM | POA: Diagnosis not present

## 2015-04-01 DIAGNOSIS — I10 Essential (primary) hypertension: Secondary | ICD-10-CM | POA: Diagnosis not present

## 2015-04-01 DIAGNOSIS — E7800 Pure hypercholesterolemia, unspecified: Secondary | ICD-10-CM | POA: Diagnosis not present

## 2015-04-01 DIAGNOSIS — R55 Syncope and collapse: Secondary | ICD-10-CM | POA: Diagnosis not present

## 2015-04-01 DIAGNOSIS — R739 Hyperglycemia, unspecified: Secondary | ICD-10-CM | POA: Diagnosis not present

## 2015-04-08 DIAGNOSIS — R7301 Impaired fasting glucose: Secondary | ICD-10-CM | POA: Diagnosis not present

## 2015-04-08 DIAGNOSIS — E782 Mixed hyperlipidemia: Secondary | ICD-10-CM | POA: Diagnosis not present

## 2015-04-12 DIAGNOSIS — I639 Cerebral infarction, unspecified: Secondary | ICD-10-CM | POA: Diagnosis not present

## 2015-04-12 DIAGNOSIS — N400 Enlarged prostate without lower urinary tract symptoms: Secondary | ICD-10-CM | POA: Diagnosis not present

## 2015-04-12 DIAGNOSIS — I10 Essential (primary) hypertension: Secondary | ICD-10-CM | POA: Diagnosis not present

## 2015-04-12 DIAGNOSIS — E782 Mixed hyperlipidemia: Secondary | ICD-10-CM | POA: Diagnosis not present

## 2015-04-12 DIAGNOSIS — R55 Syncope and collapse: Secondary | ICD-10-CM | POA: Diagnosis not present

## 2015-04-14 DIAGNOSIS — R55 Syncope and collapse: Secondary | ICD-10-CM | POA: Diagnosis not present

## 2015-05-13 DIAGNOSIS — I10 Essential (primary) hypertension: Secondary | ICD-10-CM | POA: Diagnosis not present

## 2015-05-13 DIAGNOSIS — R55 Syncope and collapse: Secondary | ICD-10-CM | POA: Diagnosis not present

## 2015-05-13 DIAGNOSIS — E7800 Pure hypercholesterolemia, unspecified: Secondary | ICD-10-CM | POA: Diagnosis not present

## 2015-05-13 DIAGNOSIS — Z8673 Personal history of transient ischemic attack (TIA), and cerebral infarction without residual deficits: Secondary | ICD-10-CM | POA: Diagnosis not present

## 2015-05-14 ENCOUNTER — Other Ambulatory Visit: Payer: Self-pay | Admitting: Neurology

## 2015-06-07 DIAGNOSIS — M546 Pain in thoracic spine: Secondary | ICD-10-CM | POA: Diagnosis not present

## 2015-06-07 DIAGNOSIS — M9903 Segmental and somatic dysfunction of lumbar region: Secondary | ICD-10-CM | POA: Diagnosis not present

## 2015-06-07 DIAGNOSIS — M9902 Segmental and somatic dysfunction of thoracic region: Secondary | ICD-10-CM | POA: Diagnosis not present

## 2015-06-07 DIAGNOSIS — M545 Low back pain: Secondary | ICD-10-CM | POA: Diagnosis not present

## 2015-06-16 ENCOUNTER — Telehealth (INDEPENDENT_AMBULATORY_CARE_PROVIDER_SITE_OTHER): Payer: Self-pay | Admitting: Internal Medicine

## 2015-06-16 NOTE — Telephone Encounter (Signed)
Patient stopped by the office and stated that the [facility] wants him to replace his Nexium with one of the following medications:  Omeprazole Pantoprazole Rabeprazole  I did explain to the patient that it had been a year since we've seen him and an office visit may be required.  [phone] (H) [phone] (C)

## 2015-06-17 NOTE — Telephone Encounter (Signed)
Patient presented to the office this morning and stated that he was going to see his PCP about the PPI .

## 2015-06-22 DIAGNOSIS — H52223 Regular astigmatism, bilateral: Secondary | ICD-10-CM | POA: Diagnosis not present

## 2015-06-22 DIAGNOSIS — H35033 Hypertensive retinopathy, bilateral: Secondary | ICD-10-CM | POA: Diagnosis not present

## 2015-06-22 DIAGNOSIS — H5203 Hypermetropia, bilateral: Secondary | ICD-10-CM | POA: Diagnosis not present

## 2015-06-22 DIAGNOSIS — H524 Presbyopia: Secondary | ICD-10-CM | POA: Diagnosis not present

## 2015-06-24 DIAGNOSIS — D225 Melanocytic nevi of trunk: Secondary | ICD-10-CM | POA: Diagnosis not present

## 2015-06-24 DIAGNOSIS — L0202 Furuncle of face: Secondary | ICD-10-CM | POA: Diagnosis not present

## 2015-06-24 DIAGNOSIS — B9689 Other specified bacterial agents as the cause of diseases classified elsewhere: Secondary | ICD-10-CM | POA: Diagnosis not present

## 2015-07-13 DIAGNOSIS — M9902 Segmental and somatic dysfunction of thoracic region: Secondary | ICD-10-CM | POA: Diagnosis not present

## 2015-07-13 DIAGNOSIS — M545 Low back pain: Secondary | ICD-10-CM | POA: Diagnosis not present

## 2015-07-13 DIAGNOSIS — M9905 Segmental and somatic dysfunction of pelvic region: Secondary | ICD-10-CM | POA: Diagnosis not present

## 2015-07-13 DIAGNOSIS — M9903 Segmental and somatic dysfunction of lumbar region: Secondary | ICD-10-CM | POA: Diagnosis not present

## 2015-07-19 DIAGNOSIS — K5900 Constipation, unspecified: Secondary | ICD-10-CM | POA: Diagnosis not present

## 2015-07-19 DIAGNOSIS — M545 Low back pain: Secondary | ICD-10-CM | POA: Diagnosis not present

## 2015-07-19 DIAGNOSIS — R112 Nausea with vomiting, unspecified: Secondary | ICD-10-CM | POA: Diagnosis not present

## 2015-07-22 ENCOUNTER — Other Ambulatory Visit (HOSPITAL_COMMUNITY): Payer: Self-pay | Admitting: Interventional Radiology

## 2015-07-22 DIAGNOSIS — I771 Stricture of artery: Secondary | ICD-10-CM

## 2015-07-27 ENCOUNTER — Ambulatory Visit (HOSPITAL_COMMUNITY)
Admission: RE | Admit: 2015-07-27 | Discharge: 2015-07-27 | Disposition: A | Discharge status: Home / Self Care | Payer: Medicare Other | Source: Ambulatory Visit | Attending: Interventional Radiology | Admitting: Interventional Radiology

## 2015-07-27 DIAGNOSIS — K219 Gastro-esophageal reflux disease without esophagitis: Secondary | ICD-10-CM | POA: Diagnosis not present

## 2015-07-27 DIAGNOSIS — I10 Essential (primary) hypertension: Secondary | ICD-10-CM | POA: Insufficient documentation

## 2015-07-27 DIAGNOSIS — I6521 Occlusion and stenosis of right carotid artery: Secondary | ICD-10-CM | POA: Diagnosis not present

## 2015-07-27 DIAGNOSIS — E7800 Pure hypercholesterolemia, unspecified: Secondary | ICD-10-CM | POA: Insufficient documentation

## 2015-07-27 DIAGNOSIS — I771 Stricture of artery: Secondary | ICD-10-CM | POA: Diagnosis not present

## 2015-07-27 NOTE — Progress Notes (Signed)
*  PRELIMINARY RESULTS* Vascular Ultrasound Carotid Duplex (Doppler) has been completed.  The right internal carotid artery exhibits 1-39% stenosis. The left internal carotid artery stent exhibits elevated velocities suggestive of upper range <50% versus low range 50-75% stenosis. Vertebral arteries are patent with antegrade flow.  07/27/2015 9:38 AM Maudry Mayhew, RVT, RDCS, RDMS

## 2015-07-28 DIAGNOSIS — R351 Nocturia: Secondary | ICD-10-CM | POA: Diagnosis not present

## 2015-07-28 DIAGNOSIS — N401 Enlarged prostate with lower urinary tract symptoms: Secondary | ICD-10-CM | POA: Diagnosis not present

## 2015-08-10 DIAGNOSIS — Z8679 Personal history of other diseases of the circulatory system: Secondary | ICD-10-CM | POA: Diagnosis not present

## 2015-08-10 DIAGNOSIS — I10 Essential (primary) hypertension: Secondary | ICD-10-CM | POA: Diagnosis not present

## 2015-08-10 DIAGNOSIS — R55 Syncope and collapse: Secondary | ICD-10-CM | POA: Diagnosis not present

## 2015-08-10 DIAGNOSIS — F0390 Unspecified dementia without behavioral disturbance: Secondary | ICD-10-CM | POA: Diagnosis not present

## 2015-08-10 DIAGNOSIS — Z6835 Body mass index (BMI) 35.0-35.9, adult: Secondary | ICD-10-CM | POA: Diagnosis not present

## 2015-08-10 DIAGNOSIS — R42 Dizziness and giddiness: Secondary | ICD-10-CM | POA: Diagnosis not present

## 2015-08-10 DIAGNOSIS — E784 Other hyperlipidemia: Secondary | ICD-10-CM | POA: Diagnosis not present

## 2015-08-10 DIAGNOSIS — F5104 Psychophysiologic insomnia: Secondary | ICD-10-CM | POA: Diagnosis not present

## 2015-08-10 DIAGNOSIS — Z1389 Encounter for screening for other disorder: Secondary | ICD-10-CM | POA: Diagnosis not present

## 2015-08-24 ENCOUNTER — Ambulatory Visit (INDEPENDENT_AMBULATORY_CARE_PROVIDER_SITE_OTHER): Payer: Medicare Other | Admitting: Neurology

## 2015-08-24 ENCOUNTER — Encounter: Payer: Self-pay | Admitting: Neurology

## 2015-08-24 ENCOUNTER — Telehealth (HOSPITAL_COMMUNITY): Payer: Self-pay

## 2015-08-24 VITALS — BP 133/72 | HR 63 | Resp 18 | Ht 68.0 in | Wt 215.0 lb

## 2015-08-24 DIAGNOSIS — E669 Obesity, unspecified: Secondary | ICD-10-CM | POA: Diagnosis not present

## 2015-08-24 DIAGNOSIS — G2581 Restless legs syndrome: Secondary | ICD-10-CM | POA: Diagnosis not present

## 2015-08-24 DIAGNOSIS — R351 Nocturia: Secondary | ICD-10-CM | POA: Diagnosis not present

## 2015-08-24 DIAGNOSIS — I63412 Cerebral infarction due to embolism of left middle cerebral artery: Secondary | ICD-10-CM | POA: Diagnosis not present

## 2015-08-24 DIAGNOSIS — I6523 Occlusion and stenosis of bilateral carotid arteries: Secondary | ICD-10-CM

## 2015-08-24 DIAGNOSIS — R413 Other amnesia: Secondary | ICD-10-CM

## 2015-08-24 DIAGNOSIS — R0683 Snoring: Secondary | ICD-10-CM

## 2015-08-24 NOTE — Patient Instructions (Signed)

## 2015-08-24 NOTE — Progress Notes (Signed)
Subjective:    Patient ID: Henry [name] is a 81 y.o. male.  HPI     Star Age, MD, PhD [facility] [address] [place], [place] [address]  Dear Dr. Philip Aspen,   I saw your patient, Henry [name], upon your kind request in my neurologic clinic today for initial consultation of his sleep disorder, in particular, concern for underlying obstructive sleep apnea. The patient is accompanied by his wife today. As you know, Henry [name] is a 81 year old right-handed gentleman with an underlying medical history of bilateral carotid artery stenosis, left MCA stroke in April 2016 with status post endovascular really vascularization through stent placement,  for which he has been seeing my colleague, Dr. Erlinda Hong (last seen on 03/08/15), history of syncope, orthostatic hypotension, hypertension, hyperlipidemia, vitamin D deficiency, and obesity, who reports snoring and some daytime somnolence, memory complaints for the past year. I reviewed your office note from 08/10/2015, which you kindly included.  His Epworth sleepiness score is 3 out of 24 today, his fatigue score is 15 out of 63. He does take a nap almost on a daily basis, usually in the late afternoon. In the mornings he feels fairly adequately rested when he first wakes up. He tries to be in bed by 9:30 and usually wakes up between 5 and 6 AM. He has nocturia usually once or twice per average night. He denies morning headaches. He has occasional restless leg symptoms in the right leg only, started after his stroke. He has a feeling of fidgetiness in the right leg and moving it helps. He denies any actual pain or numbness on the right side. He has no actual major residual symptoms on the right side with the exception of difficulty with fine motor skills and not always picking up his right foot while walking. He tries to be active. He walks about 2 miles per day. He drinks 1 glass of wine per day,  He quit  smoking about 30 years ago, drinks caffeine in the form of coffee 2-4 cups per day, also some soda during the day but noncaffeine. He has 2 children from his first marriage and 2 children from his current wife. He is retired. He has noted short-term memory issues for the past year or so, really since his stroke. He has some forgetfulness, denies any problems driving.  He has seen cardiologist, Dr. Einar Gip, and workup has been benign per their verbal report. He had a Holter monitor which was benign as well, due to his history of syncope and collapse. He is due to have a 24-hour blood pressure monitor placed. Implantable loop monitor was previously discussed as well, as I understand.   Past Medical History Is Significant For: Past Medical History  Diagnosis Date  . Hypertension   . GERD (gastroesophageal reflux disease)   . Hypercholesteremia   . HOH (hard of hearing)   . Stroke [facility])     had a stroke 05/23/2014  . Hx-TIA (transient ischemic attack)     hx of this- wife states 10 years ago-but Dr. Estil Daft told wife he had had several-has 2 stents in carotid artery-left  . FOOT SPRAIN, RIGHT 07/18/2007    Qualifier: Diagnosis of  By: Aline Brochure MD, [name]    . Esophagitis 05/10/2011  . Accelerated hypertension 06/29/2014  . Arthritis     Back  . Foley catheter in place   . Low back pain   . Dizziness and giddiness   . Syncope and  collapse   . Enlarged prostate   . Nocturia   . Chronic kidney disease     His Past Surgical History Is Significant For: Past Surgical History  Procedure Laterality Date  . Shoulder surgery Right     torn cartiledge  . Esophagogastroduodenoscopy  10/28/2010    Procedure: ESOPHAGOGASTRODUODENOSCOPY (EGD);  Surgeon: [name], MD;  [facility]: [facility];  Service: Endoscopy;  Laterality: N/A;  10:30  . Colonoscopy    . Colonoscopy  05/17/2011    Procedure: COLONOSCOPY;  Surgeon: [name], MD;  Location: [facility];  Service: Endoscopy;  Laterality:  N/A;  830  . Resection distal clavical Right 12/13/2012    Procedure: RESECTION DISTAL CLAVICAL;  Surgeon: Carole Civil, MD;  Location: [facility];  Service: Orthopedics;  Laterality: Right;  . Shoulder arthroscopy with bicepstenotomy Right 12/13/2012    Procedure: SHOULDER ARTHROSCOPY WITH BICEPS TENOTOMY, limited shoulder debridement;  Surgeon: Carole Civil, MD;  Location: [facility];  Service: Orthopedics;  Laterality: Right;  . Esophagogastroduodenoscopy N/A 04/24/2014    Procedure: ESOPHAGOGASTRODUODENOSCOPY (EGD);  Surgeon: [name], MD;  [facility]: [facility];  Service: Endoscopy;  Laterality: N/A;  815  . Radiology with anesthesia N/A 05/23/2014    Procedure: RADIOLOGY WITH ANESTHESIA;  Surgeon: Medication Radiologist, MD;  Location: [facility];  Service: Radiology;  Laterality: N/A;  . Epidural steroid injection  05/22/2014    had this in lumbar and at 1 am on 05/23/2014 had a stroke  . Carotid stents  05/2014    2 stents in carotid artery on left side  . Shoulder surgery      right shoulder  . Eus N/A 06/26/2014    Procedure: FULL UPPER ENDOSCOPIC ULTRASOUND (EUS) RADIAL;  Surgeon: [name], MD;  Location: [facility];  Service: Endoscopy;  Laterality: N/A;  . Esophagogastroduodenoscopy N/A 06/26/2014    Procedure: ESOPHAGOGASTRODUODENOSCOPY (EGD);  Surgeon: [name], MD;  Location: [facility];  Service: Endoscopy;  Laterality: N/A;  . Transurethral resection of prostate N/A 07/09/2014    Procedure: TRANSURETHRAL RESECTION of prostate WITH BUTTON ;  Surgeon: [name], MD;  Location: [facility];  Service: Urology;  Laterality: N/A;    His Family History Is Significant For: Family History  Problem Relation Age of Onset  . Colon cancer Neg Hx   . Thyroid disease Mother     His Social History Is Significant For: Social History   Social History  . Marital Status: Married    Spouse Name: N/A  . Number of Children: 4  . Years of Education: 16   Occupational History   . retired     [employer]   Social History Main Topics  . Smoking status: Former Smoker -- 0.50 packs/day for 35 years    Types: Cigarettes    Quit date: 12/10/1994  . Smokeless tobacco: None  . Alcohol Use: 1.8 oz/week    3 Glasses of wine per week  . Drug Use: No  . Sexual Activity: Yes    Birth Control/ Protection: None   Other Topics Concern  . None   Social History Narrative   Married, retired - [employer]   Right handed   Caffeine use - 3-4 cups coffee daily    His Allergies Are:  No Known Allergies:   His Current Medications Are:  Outpatient Encounter Prescriptions as of 08/24/2015  Medication Sig  . ALPRAZolam (XANAX) 0.5 MG tablet Take 1 tablet (0.5 mg total) by mouth 3 (three) times daily  as needed for anxiety.  Marland Kitchen amLODipine (NORVASC) 10 MG tablet   . aspirin 81 MG tablet Take 81 mg by mouth daily.  Marland Kitchen atorvastatin (LIPITOR) 40 MG tablet Take 1 tablet (40 mg total) by mouth every evening.  . clopidogrel (PLAVIX) 75 MG tablet TAKE 1 TABLET DAILY  . esomeprazole (NEXIUM) 40 MG capsule Take 40 mg by mouth every morning. Reported on 02/06/2015  . finasteride (PROSCAR) 5 MG tablet Take 1 tablet (5 mg total) by mouth daily.  . Fish Oil-Cholecalciferol (FISH OIL + D3) 1000-1000 MG-UNIT CAPS Take 1 capsule by mouth daily.  Marland Kitchen lisinopril-hydrochlorothiazide (PRINZIDE,ZESTORETIC) 20-12.5 MG tablet   . omeprazole (PRILOSEC) 40 MG capsule   . [DISCONTINUED] amLODipine (NORVASC) 5 MG tablet Take 1 tablet (5 mg total) by mouth at bedtime.   No facility-administered encounter medications on file as of 08/24/2015.  :  Review of Systems:  Out of a complete 14 point review of systems, all are reviewed and negative with the exception of these symptoms as listed below:   Review of Systems  Neurological: Positive for weakness.       Snoring, sometimes takes naps.    Epworth Sleepiness Scale 0= would never doze 1= slight chance of dozing 2= moderate chance of dozing 3= high  chance of dozing  Sitting and reading:0 Watching TV:0 Sitting inactive in a public place (ex. Theater or meeting):0 As a passenger in a car for an hour without a break:0 Lying down to rest in the afternoon:3 Sitting and talking to someone:0 Sitting quietly after lunch (no alcohol):0 In a car, while stopped in traffic:0 Total:3  Objective:  Neurologic Exam  Physical Exam Physical Examination:   Filed Vitals:   08/24/15 0853  BP: 133/72  Pulse: 63  Resp: 18   General Examination: The patient is a very pleasant 81 y.o. male in no acute distress. He appears well-developed and well-nourished and well groomed.   HEENT: Normocephalic, atraumatic, pupils are equal, round and reactive to light and accommodation. Funduscopic exam is normal with sharp disc margins noted. Extraocular tracking is good without limitation to gaze excursion or nystagmus noted. Normal smooth pursuit is noted. Hearing is mildly impaired, he has bilateral hearing aids in place. Face is slightly asymmetric with possible effacement of right nasolabial fold, otherwise normal facial animation and normal facial sensation. Speech is clear with no dysarthria noted. There is no hypophonia. There is no lip, neck/head, jaw or voice tremor. Neck is supple with full range of passive and active motion. There are no carotid bruits on auscultation. Oropharynx exam reveals: mild mouth dryness, adequate dental hygiene and moderate airway crowding, due to  redundant soft palate, longer uvula, larger tongue, tonsils are either very small or absent? He denies having had a tonsillectomy. Mallampati is class II. Tongue protrudes centrally and palate elevates symmetrically. Neck size is 17 inches. He has no overbite.    Chest: Clear to auscultation without wheezing, rhonchi or crackles noted.  Heart: S1+S2+0, regular and normal without murmurs, rubs or gallops noted.   Abdomen: Soft, non-tender and non-distended with normal bowel sounds  appreciated on auscultation.  Extremities: There is 1+ pitting edema in the distal lower extremities bilaterally. Pedal pulses are intact.  Skin: Warm and dry without trophic changes noted. There are no varicose veins.  Musculoskeletal: exam reveals no obvious joint deformities, tenderness or joint swelling or erythema.   Neurologically:  Mental status: The patient is awake, alert and oriented in all 4 spheres. His immediate and remote  memory, attention, language skills and fund of knowledge are appropriate. There is no evidence of aphasia, agnosia, apraxia or anomia. Speech is clear with normal prosody and enunciation. Thought process is linear. Mood is normal and affect is normal.  Cranial nerves II - XII are as described above under HEENT exam. In addition: shoulder shrug is normal with equal shoulder height noted. Motor exam: Normal bulk, strength and tone is noted. There is no drift, tremor or rebound. Romberg is negative. Reflexes are 1-2+ throughout, absent in both ankles, right side a little more prominent than left as far as reflexes in his arm and knee. Fine motor skills and coordination: mild  difficulty with the right upper and lower extremity, normal on the left.  Cerebellar testing: No dysmetria or intention tremor on finger to nose testing. Heel to shin is unremarkable bilaterally. There is no truncal or gait ataxia.  Sensory exam: intact to light touch, pinprick, vibration, temperature sense in the upper and lower extremities, with the exception of mild decrease in vibration sense in distal lower extremities bilaterally .  Gait, station and balance: He stands easily. No veering to one side is noted. No leaning to one side is noted. Posture is age-appropriate and stance is narrow based. Gait shows normal stride length and normal pace. No problems turning are noted. Tandem walk is actually quite good for age.  Assessment and Plan:  In summary, Henry [name] is a very pleasant 81  y.o.-year old male with an underlying medical history of bilateral carotid artery stenosis, left MCA stroke in April 2016 with status post endovascular really vascularization through stent placement,  for which he has been seeing my colleague, Dr. Erlinda Hong (last seen on 03/08/15), history of syncope, orthostatic hypotension, hypertension, hyperlipidemia, vitamin D deficiency, and obesity, whose history and physical exam are concerning for obstructive sleep apnea (OSA). especially, in light of his stroke history and vascular risk factors, it is imperative that he be checked for sleep apnea, in particular because he also has memory complaints. In addition, he reports occasional right side of restless leg symptoms and we will look out for PLMS during his sleep study. For his syncope he had workup in the past and also underway, implantable loop monitor may need to be considered as well and he is encouraged to discuss this with you and his cardiologist as well.  I had a long chat with the patient and his wife about my findings and the diagnosis of OSA, its prognosis and treatment options. We talked about medical treatments, surgical interventions and non-pharmacological approaches. I explained in particular the risks and ramifications of untreated moderate to severe OSA, especially with respect to developing cardiovascular disease down the Road, including congestive heart failure, difficult to treat hypertension, cardiac arrhythmias, or stroke. Even type 2 diabetes has, in part, been linked to untreated OSA. Symptoms of untreated OSA include daytime sleepiness, memory problems, mood irritability and mood disorder such as depression and anxiety, lack of energy, as well as recurrent headaches, especially morning headaches. We talked about trying to maintain a healthy lifestyle in general, as well as the importance of weight control. I encouraged the patient to eat healthy, exercise daily and keep well hydrated, to keep a  scheduled bedtime and wake time routine, to not skip any meals and eat healthy snacks in between meals. I advised the patient not to drive when feeling sleepy. I recommended the following at this time: sleep study with potential positive airway pressure titration. (We will score  hypopneas at 4% and split the sleep study into diagnostic and treatment portion, if the estimated. 2 hour AHI is >15/h).   I explained the sleep test procedure to the patient and also outlined possible surgical and non-surgical treatment options of OSA, including the use of a custom-made dental device (which would require a referral to a specialist dentist or oral surgeon), upper airway surgical options, such as pillar implants, radiofrequency surgery, tongue base surgery, and UPPP (which would involve a referral to an ENT surgeon). Rarely, jaw surgery such as mandibular advancement may be considered.  I also explained the CPAP treatment option to the patient, who indicated that he would be willing to try CPAP if the need arises. I explained the importance of being compliant with PAP treatment, not only for insurance purposes but primarily to improve His symptoms, and for the patient's long term health benefit, including to reduce His cardiovascular risks. I answered all their questions today and the patient and his wife were in agreement. I would like to see him back after the sleep study is completed and encouraged him to call with any interim questions, concerns, problems or updates.   Thank you very much for allowing me to participate in the care of this nice patient. If I can be of any further assistance to you please do not hesitate to call me at [phone].  Sincerely,   Star Age, MD, PhD

## 2015-08-24 NOTE — Telephone Encounter (Signed)
Called to schedule, left a message for pt to return call. AW

## 2015-08-26 ENCOUNTER — Telehealth (HOSPITAL_COMMUNITY): Payer: Self-pay

## 2015-08-26 NOTE — Telephone Encounter (Signed)
Called to schedule angiogram, left message for pt's wife to call back. AW

## 2015-09-01 ENCOUNTER — Institutional Professional Consult (permissible substitution): Payer: Medicare Other | Admitting: Neurology

## 2015-09-01 DIAGNOSIS — I10 Essential (primary) hypertension: Secondary | ICD-10-CM | POA: Diagnosis not present

## 2015-09-02 ENCOUNTER — Ambulatory Visit (INDEPENDENT_AMBULATORY_CARE_PROVIDER_SITE_OTHER): Payer: Medicare Other | Admitting: Neurology

## 2015-09-02 DIAGNOSIS — G4733 Obstructive sleep apnea (adult) (pediatric): Secondary | ICD-10-CM | POA: Diagnosis not present

## 2015-09-02 DIAGNOSIS — G4761 Periodic limb movement disorder: Secondary | ICD-10-CM

## 2015-09-02 DIAGNOSIS — G4720 Circadian rhythm sleep disorder, unspecified type: Secondary | ICD-10-CM

## 2015-09-02 DIAGNOSIS — G479 Sleep disorder, unspecified: Secondary | ICD-10-CM

## 2015-09-06 ENCOUNTER — Encounter: Payer: Self-pay | Admitting: Nurse Practitioner

## 2015-09-06 ENCOUNTER — Ambulatory Visit: Payer: Medicare Other | Admitting: Neurology

## 2015-09-06 ENCOUNTER — Ambulatory Visit (INDEPENDENT_AMBULATORY_CARE_PROVIDER_SITE_OTHER): Payer: Medicare Other | Admitting: Nurse Practitioner

## 2015-09-06 VITALS — BP 130/71 | HR 61 | Ht 68.0 in | Wt 221.6 lb

## 2015-09-06 DIAGNOSIS — I6523 Occlusion and stenosis of bilateral carotid arteries: Secondary | ICD-10-CM

## 2015-09-06 DIAGNOSIS — I10 Essential (primary) hypertension: Secondary | ICD-10-CM | POA: Diagnosis not present

## 2015-09-06 DIAGNOSIS — E785 Hyperlipidemia, unspecified: Secondary | ICD-10-CM | POA: Diagnosis not present

## 2015-09-06 NOTE — Patient Instructions (Signed)
continue ASA and plavix and lipitor for stroke prevention for now.   - stand up slow and keep hydrated. - BP goal 120-150 due to carotid stenosis. Today's reading 130/71 - Patient was educated on lying down immediately with feeling of orthostatic hypotension. check BP at home and record. - Follow up with your primary care physician for stroke risk factor modification. Recommend maintain blood pressure goal 120-150, diabetes with hemoglobin A1c goal below 6.5% and lipids with LDL cholesterol goal below 70 mg/dL.  - follow up in 6 months.

## 2015-09-06 NOTE — Progress Notes (Signed)
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 male hx of HTN, HLD presenting to [facility] with syncopal episode on 05/23/2014. Upon arrival found to be aphasic with right sided weakness. NIHSS = 15. Outside IV tPA window by time of arrival. A CTA showed left ICA proximal occlusion and the patient was transferred to [facility] for possible IR intervention. CT perfusion study showed large penumbra, he was taking to IR, and mechanical thrombectomy was performed. Left ICA and MCA recannulized, and left ICA stenting was performed. MRI showed left MCA territory infarct, and MRI showed wide patent left ICA and MCA. Carotid Doppler showed left ICA stent patent, and right ICA 50-60% stenosis. 2-D echo unremarkable. LDL 59 and A1c 6.2.  LV DVT negative. He was extubated on 05/25/2014 and his right hemiparesis improved nicely, however he still has severe dysarthria and aphasia. He passed swallow on discharge, was put on pured diet. Hwas put on ASA and Plavix for 3 months, and then Plavix monotherapy lifetime. He was discharged to CIR in good condition.  Follow up 06/29/14 -Dr Henry [name] the patient has been doing well from a neurological standpoint. He was discharged from [facility] on 06/04/2014. His speech much improved without dysarthria or aphasia. However he developed hematuria and the urinary retention in rehabilitation and foley catheter was placed and urology consult was requested. He continued to follow up with urology after discharge and went to ER twice for urinary retention, so far still with foley catheter and was found to have UTI. He is going to start Abx and will see urology next week for cystoscopy. He also had MRCP last week for questionable pancreas lesion and was cleared by GI. For the procedures, he was currently only on ASA 81  and plavix on hold. Wife concerns that if he is on dural antiplatelet, he will have hematuria again. His BP was high today 188/88, but wife said at home he only at 140-160. Pt is somehow agitated by the foley catheter in the clinic today.  Follow up 08/28/14 Dr. Erlinda [name]- he was doing well without recurrent symptoms. He had cystoscopy and prostate procedure and hematuria resolved. He is still on dural antiplatelet now. He had repeat CUS in 07/2014 showed right ICA 50-69% and left ICA patent with stent. He has no complains. Finished speech therapy and going to have PT to help him "go back to golf course". BP today in clinic 170/81 but at home per wife is always 130s.   Interval History1/23/17 Dr Henry [name] During the interval time, he has been doing well except syncope episode on 02/06/15. He finished breakfast and sitting in chair. He tried to get up for medication but he felt not well, able to pass out and he sat down and then passed out as observed by his wife. EMS on arrival BP 100/40 and HR 40s. He was put on stretcher and sent to [facility] ED. On ED arrival, he back to baseline. BP at 140s, however on standing up BP down to 90s but no symptoms. He had CTA showed left carotid stent patent, right carotid 50% stenosis. He was discharged with outpatient carotid Doppler as well as cardiology follow-up. He had carotid Doppler on 02/09/2015 showed left stent patent with 50-75% stenosis (likely due to high velocity with stenting) and right 40-59% stenosis. He also follows  with Dr. Einar [name], had a stress test and 30 day cardiac monitoring, results pending. Since then, he has been doing well, no recurrent episode. He also had follow-up on 02/01/2015 with Dr.Deveshwar, put back on DAPT and recommend further carotid doppler monitoring. BP today 158/72 in clinic. He stated that at home his blood pressure always around 145/73. UPDATE 07/24/2017CM Mr. [name], 81 year old male returns for follow-up with his wife. He has a history of stroke  event in April 2016. He has not had further stroke or TIA symptoms. He is on dual antiplatelet aspirin and Plavix. Recent carotid Doppler without change in April 2017. He has had sleep study one week ago, results are not ready . Blood pressure today in clinic 130/71 however he does still complain with dizziness occasionally standing. He continues to take his home blood pressures and had recent 24-hour blood pressure monitoring by Dr. Sharlett [name]. He remains on Lipitor without complaints of muscle aches. He is exercising by walking 2 miles a day. He returns for reevaluation  REVIEW OF SYSTEMS: Full 14 system review of systems performed and notable only for those listed, all others are neg:  Constitutional: neg  Cardiovascular: neg Ear/Nose/Throat: Hearing loss Skin: neg Eyes: neg Respiratory: neg Gastroitestinal: neg  Genitourinary frequency Hematology/Lymphatic: Easy bruising Endocrine: Intolerance to cold Musculoskeletal: Back pain Allergy/Immunology: neg Neurological: Occasional dizziness Psychiatric: neg Sleep : Snoring, recent sleep test results not available.   ALLERGIES: No Known Allergies  HOME MEDICATIONS: Outpatient Medications Prior to Visit  Medication Sig Dispense Refill  . ALPRAZolam (XANAX) 0.5 MG tablet Take 1 tablet (0.5 mg total) by mouth 3 (three) times daily as needed for anxiety. 30 tablet 0  . amLODipine (NORVASC) 10 MG tablet Take 10 mg by mouth daily.     Marland Kitchen aspirin 81 MG tablet Take 81 mg by mouth daily.    Marland Kitchen atorvastatin (LIPITOR) 40 MG tablet Take 1 tablet (40 mg total) by mouth every evening. 30 tablet 1  . clopidogrel (PLAVIX) 75 MG tablet TAKE 1 TABLET DAILY 90 tablet 3  . esomeprazole (NEXIUM) 40 MG capsule Take 40 mg by mouth every morning. Reported on 02/06/2015    . finasteride (PROSCAR) 5 MG tablet Take 1 tablet (5 mg total) by mouth daily. 30 tablet 11  . Fish Oil-Cholecalciferol (FISH OIL + D3) 1000-1000 MG-UNIT CAPS Take 1 capsule by mouth daily.      Marland Kitchen lisinopril-hydrochlorothiazide (PRINZIDE,ZESTORETIC) 20-12.5 MG tablet Take 1 tablet by mouth daily.     Marland Kitchen omeprazole (PRILOSEC) 40 MG capsule Take 40 mg by mouth daily.      No facility-administered medications prior to visit.     PAST MEDICAL HISTORY: Past Medical History:  Diagnosis Date  . Accelerated hypertension 06/29/2014  . Arthritis    Back  . Chronic kidney disease   . Dizziness and giddiness   . Enlarged prostate   . Esophagitis 05/10/2011  . Foley catheter in place   . FOOT SPRAIN, RIGHT 07/18/2007   Qualifier: Diagnosis of  By: Aline Brochure MD, [name]    . GERD (gastroesophageal reflux disease)   . HOH (hard of hearing)   . Hx-TIA (transient ischemic attack)    hx of this- wife states 10 years ago-but Dr. Estil Daft told wife he had had several-has 2 stents in carotid artery-left  . Hypercholesteremia   . Hypertension   . Low back pain   . Nocturia   . Stroke [facility])    had a stroke 05/23/2014  . Syncope and collapse  PAST SURGICAL HISTORY: Past Surgical History:  Procedure Laterality Date  . carotid stents  05/2014   2 stents in carotid artery on left side  . COLONOSCOPY    . COLONOSCOPY  05/17/2011   Procedure: COLONOSCOPY;  Surgeon: [name], MD;  [facility];  Service: Endoscopy;  Laterality: N/A;  830  . epidural steroid injection  05/22/2014   had this in lumbar and at 1 am on 05/23/2014 had a stroke  . ESOPHAGOGASTRODUODENOSCOPY  10/28/2010   Procedure: ESOPHAGOGASTRODUODENOSCOPY (EGD);  Surgeon: [name], MD;  [facility];  Service: Endoscopy;  Laterality: N/A;  10:30  . ESOPHAGOGASTRODUODENOSCOPY N/A 04/24/2014   Procedure: ESOPHAGOGASTRODUODENOSCOPY (EGD);  Surgeon: [name], MD;  [facility];  Service: Endoscopy;  Laterality: N/A;  815  . ESOPHAGOGASTRODUODENOSCOPY N/A 06/26/2014   Procedure: ESOPHAGOGASTRODUODENOSCOPY (EGD);  Surgeon: [name], MD;  [facility];  Service: Endoscopy;   Laterality: N/A;  . EUS N/A 06/26/2014   Procedure: FULL UPPER ENDOSCOPIC ULTRASOUND (EUS) RADIAL;  Surgeon: [name], MD;  Location: [facility];  Service: Endoscopy;  Laterality: N/A;  . RADIOLOGY WITH ANESTHESIA N/A 05/23/2014   Procedure: RADIOLOGY WITH ANESTHESIA;  Surgeon: Medication Radiologist, MD;  Location: [facility];  Service: Radiology;  Laterality: N/A;  . RESECTION DISTAL CLAVICAL Right 12/13/2012   Procedure: RESECTION DISTAL CLAVICAL;  Surgeon: Carole Civil, MD;  Location: [facility];  Service: Orthopedics;  Laterality: Right;  . SHOULDER ARTHROSCOPY WITH BICEPSTENOTOMY Right 12/13/2012   Procedure: SHOULDER ARTHROSCOPY WITH BICEPS TENOTOMY, limited shoulder debridement;  Surgeon: Carole Civil, MD;  Location: [facility];  Service: Orthopedics;  Laterality: Right;  . SHOULDER SURGERY Right    torn cartiledge  . SHOULDER SURGERY     right shoulder  . TRANSURETHRAL RESECTION OF PROSTATE N/A 07/09/2014   Procedure: TRANSURETHRAL RESECTION of prostate WITH BUTTON ;  Surgeon: [name], MD;  Location: [facility];  Service: Urology;  Laterality: N/A;    FAMILY HISTORY: Family History  Problem Relation Age of Onset  . Thyroid disease Mother   . Colon cancer Neg Hx     SOCIAL HISTORY: Social History   Social History  . Marital status: Married    Spouse name: N/A  . Number of children: 4  . Years of education: 39   Occupational History  . retired     [employer]   Social History Main Topics  . Smoking status: Former Smoker    Packs/day: 0.50    Years: 35.00    Types: Cigarettes    Quit date: 12/10/1994  . Smokeless tobacco: Never Used  . Alcohol use 1.8 oz/week    3 Glasses of wine per week  . Drug use: No  . Sexual activity: Yes    Birth control/ protection: None   Other Topics Concern  . Not on file   Social History Narrative   Married, retired - [employer]   Right handed   Caffeine use - 3-4 cups coffee daily     PHYSICAL EXAM  Vitals:   09/06/15  1016  BP: 130/71  Pulse: 61  Weight: 221 lb 9.6 oz (100.5 kg)  Height: 5\' 8"  (1.727 m)   Body mass index is 33.69 kg/m.  Generalized: Well developed, in no acute distress  Head: normocephalic and atraumatic,. Oropharynx benign  Neck: Supple, no carotid bruits  Cardiac: Regular rate rhythm, no murmur  Musculoskeletal: No deformity   Neurological examination   Mentation: Alert oriented to time,  place, history taking. Attention span and concentration appropriate. Recent and remote memory intact.  Follows all commands speech and language fluent.   Cranial nerve II-XII: Pupils were equal round reactive to light extraocular movements were full, visual field were full on confrontational test. Facial sensation and strength were normal. hearing was intact to finger rubbing bilaterally. Uvula tongue midline. head turning and shoulder shrug were normal and symmetric.Tongue protrusion into cheek strength was normal. Motor: normal bulk and tone, full strength in the BUE, BLE, fine finger movements normal, no pronator drift. No focal weakness Sensory: normal and symmetric to light touch, pinprick, and  Vibration, In the upper and lower extremities Coordination: finger-nose-finger, heel-to-shin bilaterally, no dysmetria Reflexes: 1+ upper lower and symmetric, plantar responses were flexor bilaterally. Gait and Station: Rising up from seated position without assistance, normal stance,  moderate stride, good arm swing, smooth turning, able to perform tiptoe, and heel walking without difficulty. Tandem gait is steady  DIAGNOSTIC DATA (LABS, IMAGING, TESTING) - I reviewed patient records, labs, notes, testing and imaging myself where available.  Lab Results  Component Value Date   WBC 6.8 02/06/2015   HGB 13.0 02/06/2015   HCT 40.5 02/06/2015   MCV 84.9 02/06/2015   PLT 202 02/06/2015      Component Value Date/Time   NA 142 02/06/2015 0925   NA 141 10/11/2010   K 3.8 02/06/2015 0925   CL 106  02/06/2015 0925   CO2 29 02/06/2015 0925   GLUCOSE 111 (H) 02/06/2015 0925   BUN 17 02/06/2015 0925   BUN 21 10/11/2010   CREATININE 1.01 02/06/2015 0925   CALCIUM 8.9 02/06/2015 0925   PROT 5.8 (L) 07/06/2014 0445   ALBUMIN 2.9 (L) 07/06/2014 0445   AST 24 07/06/2014 0445   ALT 27 07/06/2014 0445   ALKPHOS 49 07/06/2014 0445   BILITOT 0.5 07/06/2014 0445   GFRNONAA >60 02/06/2015 0925   GFRAA >60 02/06/2015 0925   Lab Results  Component Value Date   CHOL 110 05/24/2014   HDL 35 (L) 05/24/2014   LDLCALC 59 05/24/2014   TRIG 79 05/24/2014   CHOLHDL 3.1 05/24/2014   Lab Results  Component Value Date   HGBA1C 6.2 (H) 05/24/2014   No results found for: [id number] Lab Results  Component Value Date   TSH 0.73 10/11/2010    Carotid Doppler 05/24/2014 Right: ECA stenosis. 40-59% internal carotid artery stenosis by velocities. But ICA/CCA ratio of 3.07 suggests stenosis in the 60-79% range. Left: <50% stenosis in the stent. Bilateral: Vertebral artery flow is antegrade. Carotid doppler 07/21/14 - right ICA 50-69% stenosis, left ICA s/p stent and patent. Carotid Doppler 02/09/2015 - Right 40-59% internal carotid artery stenosis due to end diastolic. Left Post carotid stent 50%-75% internal carotid artery. Vertebral antegraded bilaterally. Carotid Doppler 04/10/2017Right: ECA stenosis. 40-59% ICA stenosis by velocities. ICA/CCA ratio of 3.07 suggests stenosis in the 60-79% range. Left: <50% ICA stenosis in the stent. Bilateral: Vertebral artery flow is antegrade. ASSESSMENT AND PLAN  81 y.o. Caucasian male with PMH of HTN, HLD was admitted on 05/23/14 for left MCA stroke due to left ICA and MCA tandem occlusion. IV tPA not given due to out of window but IR performed mechanical thrombectomy and achieved recannulization of left ICA and MCA. Left ICA stenting was done also. His language and right-sided hemiparesis recovered remarkably and the current examination showed no significant  neuro deficit. He was put on aspirin and Plavix dual antiplatelet and discharged to CIR. However, during the interval  time, he developed hematuria, urinary retention, UTI, and was put on foley catheter. Had cystoscopy and prostate procedure and hematuria resolved. He also had MRCP for questionable pancreas lesion but was cleared by GI. Repeat carotid Doppler showed patent left ICA stent and right ICA 50-69%. Finished 3 months of DAPT and continued on Plavix. Follow up with Dr. Estanislado Pandy in 01/30/15 and put back on DAPT. Had syncope episode due to orthostatic hypotension on 02/06/2015, repeat CUS shows stable right ICA 40-59% stenosis and CTA head and neck showed right ICA 50% stenosis and left ICA stent patent. 05/24/2015 CUS [place]: ECA stenosis. 40-59% ICA stenosis by velocities. ICA/CCA ratio of 3.07 suggests stenosis in the 60-79% range. Left: <50% ICA stenosis in the stent. Bilateral: Vertebral artery flow is antegrade.   PLAN: continue ASA and plavix and lipitor for stroke prevention for now.   - stand up slow and keep hydrated. - BP goal 120-150 due to carotid stenosis. Today's reading 130/71 - Patient was educated on lying down immediately with feeling of orthostatic hypotension. check BP at home and record. - Follow up with your primary care physician for stroke risk factor modification. Recommend maintain blood pressure goal 120-150, diabetes with hemoglobin A1c goal below 6.5% and lipids with LDL cholesterol goal below 70 mg/dL.  - follow up in 6 months. Dennie Bible, [facility], [facility], APRN  [facility] [address], [place], [place] [address] [phone]

## 2015-09-06 NOTE — Progress Notes (Signed)
I reviewed above note and agree with the assessment and plan.  Rosalin Hawking, MD PhD Stroke Neurology 09/06/2015 7:30 PM

## 2015-09-07 ENCOUNTER — Telehealth: Payer: Self-pay | Admitting: Neurology

## 2015-09-07 ENCOUNTER — Telehealth: Payer: Self-pay

## 2015-09-07 DIAGNOSIS — G4761 Periodic limb movement disorder: Secondary | ICD-10-CM

## 2015-09-07 DIAGNOSIS — G4733 Obstructive sleep apnea (adult) (pediatric): Secondary | ICD-10-CM

## 2015-09-07 DIAGNOSIS — G479 Sleep disorder, unspecified: Secondary | ICD-10-CM

## 2015-09-07 DIAGNOSIS — G4720 Circadian rhythm sleep disorder, unspecified type: Secondary | ICD-10-CM

## 2015-09-07 NOTE — Telephone Encounter (Signed)
Patient referred by Dr. Philip Aspen, seen by me on 08/24/15, diagnostic PSG on 09/02/15, ins: MCR.   Please call and notify the patient that the recent sleep study did confirm the diagnosis of mod to severe obstructive sleep apnea and that I recommend treatment for this in the form of CPAP. This will require a repeat sleep study for proper titration and mask fitting. Please explain to patient and arrange for a CPAP titration study. I have placed an order in the chart. Thanks, and please route to [facility] for scheduling next sleep study.  Star Age, MD, PhD [facility])

## 2015-09-07 NOTE — Telephone Encounter (Signed)
Opened in error

## 2015-09-07 NOTE — Telephone Encounter (Signed)
LM for patient to call back.

## 2015-09-07 NOTE — Telephone Encounter (Signed)
I called wife back ( on HIPPA). I gave results and recommendations. She voiced understanding. Wife reported that patient will want to proceed with titration study. Copy of report will be sent to PCP.

## 2015-09-07 NOTE — Telephone Encounter (Signed)
[name] called to get results. Please call and advise

## 2015-09-10 DIAGNOSIS — I10 Essential (primary) hypertension: Secondary | ICD-10-CM | POA: Diagnosis not present

## 2015-09-16 ENCOUNTER — Ambulatory Visit (INDEPENDENT_AMBULATORY_CARE_PROVIDER_SITE_OTHER): Payer: Medicare Other | Admitting: Neurology

## 2015-09-16 DIAGNOSIS — G4720 Circadian rhythm sleep disorder, unspecified type: Secondary | ICD-10-CM

## 2015-09-16 DIAGNOSIS — G4761 Periodic limb movement disorder: Secondary | ICD-10-CM

## 2015-09-16 DIAGNOSIS — G4733 Obstructive sleep apnea (adult) (pediatric): Secondary | ICD-10-CM | POA: Diagnosis not present

## 2015-09-16 DIAGNOSIS — G479 Sleep disorder, unspecified: Secondary | ICD-10-CM

## 2015-09-21 ENCOUNTER — Telehealth: Payer: Self-pay | Admitting: Neurology

## 2015-09-21 ENCOUNTER — Other Ambulatory Visit (HOSPITAL_COMMUNITY): Payer: Self-pay | Admitting: Interventional Radiology

## 2015-09-21 DIAGNOSIS — I639 Cerebral infarction, unspecified: Secondary | ICD-10-CM

## 2015-09-21 DIAGNOSIS — G4733 Obstructive sleep apnea (adult) (pediatric): Secondary | ICD-10-CM

## 2015-09-21 DIAGNOSIS — I771 Stricture of artery: Secondary | ICD-10-CM

## 2015-09-21 NOTE — Telephone Encounter (Signed)
I spoke to patient and he is aware of results and recommendations. He is willing to start treatment. He wouldlike orders sent to [facility]. I will send report to PCP and send the patient a letter reminding him to make f/u appt and stress the importance of compliance.

## 2015-09-21 NOTE — Telephone Encounter (Signed)
Patient referred by Dr. Philip Aspen, seen by me on 08/24/15, diagnostic PSG on 09/02/15, cpap study 09/16/15, ins: MCR.  Please call and inform patient that I have entered an order for treatment with positive airway pressure (PAP) treatment of obstructive sleep apnea (OSA). He did well during the latest sleep study with CPAP. We will, therefore, arrange for a machine for home use through a DME (durable medical equipment) company of His choice; and I will see the patient back in follow-up in about 8-10 weeks. Please also explain to the patient that I will be looking out for compliance data, which can be downloaded from the machine (stored on an SD card, that is inserted in the machine) or via remote access through a modem, that is built into the machine. At the time of the followup appointment we will discuss sleep study results and how it is going with PAP treatment at home. Please advise patient to bring His machine at the time of the first FU visit, even though this is cumbersome. Bringing the machine for every visit after that will likely not be needed, but often helps for the first visit to troubleshoot if needed. Please re-enforce the importance of compliance with treatment and the need for [place] to monitor compliance data - often an insurance requirement and actually good feedback for the patient as far as how they are doing.  Also remind patient, that any interim PAP machine or mask issues should be first addressed with the DME company, as they can often help better with technical and mask fit issues. Please ask if patient has a preference regarding DME company.  Please also make sure, the patient has a follow-up appointment with me in about 8-10 weeks from the setup date, thanks.  Once you have spoken to the patient - and faxed/routed report to PCP and referring MD (if other than PCP), you can close this encounter, thanks,   Star Age, MD, PhD [facility] ([place])

## 2015-09-22 ENCOUNTER — Telehealth: Payer: Self-pay | Admitting: Neurology

## 2015-09-22 NOTE — Telephone Encounter (Signed)
[facility] [phone] called said they do not accept medicare. You will need to send CPAP RX to someone who has a Diplomatic Services operational officer

## 2015-09-22 NOTE — Telephone Encounter (Signed)
Referral resent to [facility] in [place]. I will send new letter to patient.

## 2015-10-11 ENCOUNTER — Other Ambulatory Visit: Payer: Self-pay | Admitting: Radiology

## 2015-10-12 ENCOUNTER — Other Ambulatory Visit (HOSPITAL_COMMUNITY): Payer: Self-pay | Admitting: Interventional Radiology

## 2015-10-12 ENCOUNTER — Ambulatory Visit (HOSPITAL_COMMUNITY)
Admission: RE | Admit: 2015-10-12 | Discharge: 2015-10-12 | Disposition: A | Discharge status: Home / Self Care | Payer: Medicare Other | Source: Ambulatory Visit | Attending: Interventional Radiology | Admitting: Interventional Radiology

## 2015-10-12 ENCOUNTER — Encounter (HOSPITAL_COMMUNITY): Payer: Self-pay

## 2015-10-12 DIAGNOSIS — E7800 Pure hypercholesterolemia, unspecified: Secondary | ICD-10-CM | POA: Diagnosis not present

## 2015-10-12 DIAGNOSIS — I639 Cerebral infarction, unspecified: Secondary | ICD-10-CM

## 2015-10-12 DIAGNOSIS — K219 Gastro-esophageal reflux disease without esophagitis: Secondary | ICD-10-CM | POA: Diagnosis not present

## 2015-10-12 DIAGNOSIS — M1990 Unspecified osteoarthritis, unspecified site: Secondary | ICD-10-CM | POA: Insufficient documentation

## 2015-10-12 DIAGNOSIS — I771 Stricture of artery: Secondary | ICD-10-CM

## 2015-10-12 DIAGNOSIS — N400 Enlarged prostate without lower urinary tract symptoms: Secondary | ICD-10-CM | POA: Insufficient documentation

## 2015-10-12 DIAGNOSIS — Z7982 Long term (current) use of aspirin: Secondary | ICD-10-CM | POA: Diagnosis not present

## 2015-10-12 DIAGNOSIS — Z48812 Encounter for surgical aftercare following surgery on the circulatory system: Secondary | ICD-10-CM | POA: Diagnosis not present

## 2015-10-12 DIAGNOSIS — I129 Hypertensive chronic kidney disease with stage 1 through stage 4 chronic kidney disease, or unspecified chronic kidney disease: Secondary | ICD-10-CM | POA: Insufficient documentation

## 2015-10-12 DIAGNOSIS — Z7902 Long term (current) use of antithrombotics/antiplatelets: Secondary | ICD-10-CM | POA: Diagnosis not present

## 2015-10-12 DIAGNOSIS — Z87891 Personal history of nicotine dependence: Secondary | ICD-10-CM | POA: Diagnosis not present

## 2015-10-12 DIAGNOSIS — Z8673 Personal history of transient ischemic attack (TIA), and cerebral infarction without residual deficits: Secondary | ICD-10-CM | POA: Diagnosis not present

## 2015-10-12 DIAGNOSIS — N189 Chronic kidney disease, unspecified: Secondary | ICD-10-CM | POA: Insufficient documentation

## 2015-10-12 DIAGNOSIS — I6523 Occlusion and stenosis of bilateral carotid arteries: Secondary | ICD-10-CM | POA: Insufficient documentation

## 2015-10-12 DIAGNOSIS — I6521 Occlusion and stenosis of right carotid artery: Secondary | ICD-10-CM | POA: Diagnosis not present

## 2015-10-12 HISTORY — PX: IR GENERIC HISTORICAL: IMG1180011

## 2015-10-12 LAB — BASIC METABOLIC PANEL
Anion gap: 8 (ref 5–15)
BUN: 13 mg/dL (ref 6–20)
CO2: 26 mmol/L (ref 22–32)
Calcium: 9.2 mg/dL (ref 8.9–10.3)
Chloride: 105 mmol/L (ref 101–111)
Creatinine, Ser: 0.96 mg/dL (ref 0.61–1.24)
GFR calc Af Amer: >60 mL/min (ref >60)
GFR calc non Af Amer: >60 mL/min (ref >60)
Glucose, Bld: 100 mg/dL — ABNORMAL HIGH (ref 65–99)
Potassium: 3.6 mmol/L (ref 3.5–5.1)
Sodium: 139 mmol/L (ref 135–145)

## 2015-10-12 LAB — CBC WITH DIFFERENTIAL/PLATELET
Basophils Absolute: 0 10*3/uL (ref 0.0–0.1)
Basophils Relative: 0 %
Eosinophils Absolute: 0.4 10*3/uL (ref 0.0–0.7)
Eosinophils Relative: 6 %
HCT: 42 % (ref 39.0–52.0)
Hemoglobin: 13.4 g/dL (ref 13.0–17.0)
Lymphocytes Relative: 47 %
Lymphs Abs: 3.4 10*3/uL (ref 0.7–4.0)
MCH: 27.6 pg (ref 26.0–34.0)
MCHC: 31.9 g/dL (ref 30.0–36.0)
MCV: 86.4 fL (ref 78.0–100.0)
Monocytes Absolute: 0.5 10*3/uL (ref 0.1–1.0)
Monocytes Relative: 8 %
Neutro Abs: 2.8 10*3/uL (ref 1.7–7.7)
Neutrophils Relative %: 39 %
Platelets: 263 10*3/uL (ref 150–400)
RBC: 4.86 MIL/uL (ref 4.22–5.81)
RDW: 14.4 % (ref 11.5–15.5)
WBC: 7.2 10*3/uL (ref 4.0–10.5)

## 2015-10-12 LAB — PROTIME-INR
INR: 1.06
PROTHROMBIN TIME: 13.8 s (ref 11.4–15.2)

## 2015-10-12 MED ORDER — LIDOCAINE HCL 1 % IJ SOLN
INTRAMUSCULAR | Status: AC | PRN
  Administered 2015-10-12: 10 mL

## 2015-10-12 MED ORDER — LIDOCAINE HCL 1 % IJ SOLN
INTRAMUSCULAR | Status: AC
  Filled 2015-10-12: qty 20

## 2015-10-12 MED ORDER — IOPAMIDOL (ISOVUE-300) INJECTION 61%
INTRAVENOUS | Status: AC
  Administered 2015-10-12: 20 mL
  Filled 2015-10-12: qty 50

## 2015-10-12 MED ORDER — IOPAMIDOL (ISOVUE-300) INJECTION 61%
INTRAVENOUS | Status: AC
  Administered 2015-10-12: 100 mL
  Filled 2015-10-12: qty 150

## 2015-10-12 MED ORDER — SODIUM CHLORIDE 0.9 % IV SOLN: INTRAVENOUS | Status: AC

## 2015-10-12 MED ORDER — FENTANYL CITRATE (PF) 100 MCG/2ML IJ SOLN
INTRAMUSCULAR | Status: AC | PRN
  Administered 2015-10-12: 25 ug via INTRAVENOUS

## 2015-10-12 MED ORDER — MIDAZOLAM HCL 2 MG/2ML IJ SOLN
INTRAMUSCULAR | Status: AC | PRN
  Administered 2015-10-12: 1 mg via INTRAVENOUS

## 2015-10-12 MED ORDER — SODIUM CHLORIDE 0.9 % IV SOLN
INTRAVENOUS | Status: DC
  Administered 2015-10-12: 07:00:00 via INTRAVENOUS

## 2015-10-12 MED ORDER — HEPARIN SODIUM (PORCINE) 1000 UNIT/ML IJ SOLN
INTRAMUSCULAR | Status: AC | PRN
  Administered 2015-10-12: 1000 [IU] via INTRAVENOUS

## 2015-10-12 NOTE — Sedation Documentation (Signed)
Right groin unremarkable, level 0. 33F exoseal in place

## 2015-10-12 NOTE — Sedation Documentation (Signed)
Vital signs stable. No complaints from pt at this time.  

## 2015-10-12 NOTE — H&P (Signed)
Chief Complaint: Patient was seen in consultation today for cerebral arteriogram at the request of Dr Rosalin Hawking  Referring Physician(s): D Rosalin Hawking  Supervising Physician: Luanne Bras  Patient Status: Outpatient  History of Present Illness: Henry [name] is a 81 y.o. male   CVA 05/23/2014 Left Internal Carotid artery angioplasty/stent placed 05/23/2014 Followed by Dr Erlinda Hong and Dr Estanislado Pandy Pt continues to have some Rt handed weakness Slow speech as day goes on And "wobbly/drunk" feeling when first stands up for approx 5 minutes Does also have memory issue with names----now using CPAP per PMD These symptoms are same since CVA; no change; no worse  Carotid doppler 07/2015: Summary: The right internal carotid artery exhibits 1-39% stenosis. The left internal carotid artery stent exhibits elevated velocities suggestive of upper range <50% versus low range 50-75% stenosis. Vertebral arteries are patent with antegrade flow.  Scheduled now for follow up cerebral arteriogram  Past Medical History:  Diagnosis Date  . Accelerated hypertension 06/29/2014  . Arthritis    Back  . Chronic kidney disease   . Dizziness and giddiness   . Enlarged prostate   . Esophagitis 05/10/2011  . Foley catheter in place   . FOOT SPRAIN, RIGHT 07/18/2007   Qualifier: Diagnosis of  By: Aline Brochure MD, [name]    . GERD (gastroesophageal reflux disease)   . HOH (hard of hearing)   . Hx-TIA (transient ischemic attack)    hx of this- wife states 10 years ago-but Dr. Estil Daft told wife he had had several-has 2 stents in carotid artery-left  . Hypercholesteremia   . Hypertension   . Low back pain   . Nocturia   . Stroke [facility])    had a stroke 05/23/2014  . Syncope and collapse     Past Surgical History:  Procedure Laterality Date  . carotid stents  05/2014   2 stents in carotid artery on left side  . COLONOSCOPY    . COLONOSCOPY  05/17/2011   Procedure: COLONOSCOPY;  Surgeon: [name], MD;  [facility];  Service: Endoscopy;  Laterality: N/A;  830  . epidural steroid injection  05/22/2014   had this in lumbar and at 1 am on 05/23/2014 had a stroke  . ESOPHAGOGASTRODUODENOSCOPY  10/28/2010   Procedure: ESOPHAGOGASTRODUODENOSCOPY (EGD);  Surgeon: [name], MD;  [facility];  Service: Endoscopy;  Laterality: N/A;  10:30  . ESOPHAGOGASTRODUODENOSCOPY N/A 04/24/2014   Procedure: ESOPHAGOGASTRODUODENOSCOPY (EGD);  Surgeon: [name], MD;  [facility];  Service: Endoscopy;  Laterality: N/A;  815  . ESOPHAGOGASTRODUODENOSCOPY N/A 06/26/2014   Procedure: ESOPHAGOGASTRODUODENOSCOPY (EGD);  Surgeon: [name], MD;  Location[facility];  Service: Endoscopy;  Laterality: N/A;  . EUS N/A 06/26/2014   Procedure: FULL UPPER ENDOSCOPIC ULTRASOUND (EUS) RADIAL;  Surgeon: [name], MD;  Location: [facility];  Service: Endoscopy;  Laterality: N/A;  . RADIOLOGY WITH ANESTHESIA N/A 05/23/2014   Procedure: RADIOLOGY WITH ANESTHESIA;  Surgeon: Medication Radiologist, MD;  Location: [facility];  Service: Radiology;  Laterality: N/A;  . RESECTION DISTAL CLAVICAL Right 12/13/2012   Procedure: RESECTION DISTAL CLAVICAL;  Surgeon: Carole Civil, MD;  Location: [facility];  Service: Orthopedics;  Laterality: Right;  . SHOULDER ARTHROSCOPY WITH BICEPSTENOTOMY Right 12/13/2012   Procedure: SHOULDER ARTHROSCOPY WITH BICEPS TENOTOMY, limited shoulder debridement;  Surgeon: Carole Civil, MD;  Location: [facility];  Service: Orthopedics;  Laterality: Right;  . SHOULDER SURGERY Right    torn cartiledge  .  SHOULDER SURGERY     right shoulder  . TRANSURETHRAL RESECTION OF PROSTATE N/A 07/09/2014   Procedure: TRANSURETHRAL RESECTION of prostate WITH BUTTON ;  Surgeon: [name], MD;  Location: [facility];  Service: Urology;  Laterality: N/A;    Allergies: Review of patient's allergies indicates no known allergies.  Medications: Prior to Admission  medications   Medication Sig Start Date End Date Taking? Authorizing Provider  ALPRAZolam Duanne Moron) 0.5 MG tablet Take 1 tablet (0.5 mg total) by mouth 3 (three) times daily as needed for anxiety. 06/05/14  Yes Daniel J Angiulli, PA-C  amLODipine (NORVASC) 10 MG tablet Take 10 mg by mouth daily.  05/27/15  Yes Historical Provider, MD  aspirin 81 MG tablet Take 81 mg by mouth daily.   Yes Historical Provider, MD  atorvastatin (LIPITOR) 40 MG tablet Take 1 tablet (40 mg total) by mouth every evening. 06/05/14  Yes Lavon Paganini Angiulli, PA-C  Cholecalciferol (VITAMIN D PO) Take 1 capsule by mouth every evening.   Yes Historical Provider, MD  clopidogrel (PLAVIX) 75 MG tablet TAKE 1 TABLET DAILY 05/14/15  Yes Rosalin Hawking, MD  esomeprazole (NEXIUM) 40 MG capsule Take 40 mg by mouth every morning. Reported on 02/06/2015   Yes Historical Provider, MD  finasteride (PROSCAR) 5 MG tablet Take 1 tablet (5 mg total) by mouth daily. 06/03/14  Yes [name], MD  Fish Oil-Cholecalciferol (FISH OIL + D3) 1000-1000 MG-UNIT CAPS Take 1 capsule by mouth daily.   Yes Historical Provider, MD  lisinopril-hydrochlorothiazide (PRINZIDE,ZESTORETIC) 20-12.5 MG tablet Take 1 tablet by mouth daily.  06/18/15  Yes Historical Provider, MD  omeprazole (PRILOSEC) 40 MG capsule Take 40 mg by mouth daily.  07/15/15  Yes Historical Provider, MD     Family History  Problem Relation Age of Onset  . Thyroid disease Mother   . Colon cancer Neg Hx     Social History   Social History  . Marital status: Married    Spouse name: N/A  . Number of children: 4  . Years of education: 34   Occupational History  . retired     [employer]   Social History Main Topics  . Smoking status: Former Smoker    Packs/day: 0.50    Years: 35.00    Types: Cigarettes    Quit date: 12/10/1994  . Smokeless tobacco: Never Used  . Alcohol use 1.8 oz/week    3 Glasses of wine per week  . Drug use: No  . Sexual activity: Yes    Birth control/ protection:  None   Other Topics Concern  . None   Social History Narrative   Married, retired - [employer]   Right handed   Caffeine use - 3-4 cups coffee daily     Review of Systems: A 12 point ROS discussed and pertinent positives are indicated in the HPI above.  All other systems are negative.  Review of Systems  Constitutional: Negative for activity change, appetite change, fatigue and fever.  HENT: Negative for tinnitus, trouble swallowing and voice change.   Eyes: Negative for visual disturbance.  Respiratory: Negative for cough and shortness of breath.   Cardiovascular: Negative for chest pain.  Gastrointestinal: Negative for nausea.  Musculoskeletal: Negative for gait problem.  Neurological: Positive for weakness and light-headedness. Negative for dizziness, tremors, seizures, syncope, facial asymmetry, speech difficulty, numbness and headaches.  Psychiatric/Behavioral: Negative for behavioral problems and confusion.    Vital Signs: BP (!) 147/92   Pulse 61   Temp 97.9 F (  36.6 C)   Resp 20   Ht 5\' 8"  (1.727 m)   Wt 210 lb (95.3 kg)   SpO2 99%   BMI 31.93 kg/m   Physical Exam  Constitutional: He is oriented to person, place, and time. He appears well-nourished.  Eyes: EOM are normal.  Neck: Neck supple.  Cardiovascular: Normal rate, regular rhythm and normal heart sounds.   Pulmonary/Chest: Effort normal and breath sounds normal. He has no wheezes.  Abdominal: Soft. There is no tenderness.  Musculoskeletal: Normal range of motion. He exhibits no edema.  Neurological: He is alert and oriented to person, place, and time.  Skin: Skin is warm and dry.  Psychiatric: He has a normal mood and affect. His behavior is normal. Judgment and thought content normal.  Nursing note and vitals reviewed.   Mallampati Score:  MD Evaluation Airway: WNL Heart: WNL Abdomen: WNL Chest/ Lungs: WNL ASA  Classification: 3 Mallampati/Airway Score: One  Imaging: No results  found.  Labs:  CBC:  Recent Labs  02/06/15 0925 10/12/15 0656  WBC 6.8 7.2  HGB 13.0 13.4  HCT 40.5 42.0  PLT 202 263    COAGS:  Recent Labs  10/12/15 0656  INR 1.06    BMP:  Recent Labs  02/06/15 0925 10/12/15 0656  NA 142 139  K 3.8 3.6  CL 106 105  CO2 29 26  GLUCOSE 111* 100*  BUN 17 13  CALCIUM 8.9 9.2  CREATININE 1.01 0.96  GFRNONAA >60 >60  GFRAA >60 >60    LIVER FUNCTION TESTS: No results for input(s): BILITOT, AST, ALT, ALKPHOS, PROT, ALBUMIN in the last 8760 hours.  TUMOR MARKERS: No results for input(s): AFPTM, CEA, CA199, CHROMGRNA in the last 8760 hours.  Assessment and Plan:  CVA 05/2014 L ICA pta/stent 05/2014 Follow up doppler stable Now for follow up cerebral arteriogram Risks and Benefits discussed with the patient including, but not limited to bleeding, infection, vascular injury, contrast induced renal failure, stroke or even death. All of the patient's questions were answered, patient is agreeable to proceed. Consent signed and in chart.  Thank you for this interesting consult.  I greatly enjoyed meeting Henry [name] and look forward to participating in their care.  A copy of this report was sent to the requesting provider on this date.  Electronically Signed: Monia Sabal A 10/12/2015, 8:12 AM   I spent a total of  30 Minutes   in face to face in clinical consultation, greater than 50% of which was counseling/coordinating care for cerebral arteriogram

## 2015-10-12 NOTE — Sedation Documentation (Signed)
Vital signs stable. 

## 2015-10-12 NOTE — Sedation Documentation (Signed)
Patient is resting comfortably. No complaints at this time 

## 2015-10-12 NOTE — Sedation Documentation (Signed)
Patient is resting comfortably. 

## 2015-10-12 NOTE — Procedures (Signed)
S/P 4 vessel cerebral arteroiopgram. RT CFA approach. Findings. 1.Approx 65 to 70 % stenosis of RT CCA prox to the bifurcation . 2.Occluded LT ECA with reconstitution from  retrograde ipsilateral occiptal artery.Marland Kitchen 3.Approx 30 % stenosis within stented LT ICA prox

## 2015-10-12 NOTE — Discharge Instructions (Signed)

## 2015-11-17 ENCOUNTER — Ambulatory Visit (INDEPENDENT_AMBULATORY_CARE_PROVIDER_SITE_OTHER): Payer: Medicare Other | Admitting: Neurology

## 2015-11-17 ENCOUNTER — Encounter: Payer: Self-pay | Admitting: Neurology

## 2015-11-17 VITALS — BP 150/71 | HR 61 | Resp 16 | Ht 68.0 in | Wt 225.0 lb

## 2015-11-17 DIAGNOSIS — I6523 Occlusion and stenosis of bilateral carotid arteries: Secondary | ICD-10-CM

## 2015-11-17 DIAGNOSIS — G4733 Obstructive sleep apnea (adult) (pediatric): Secondary | ICD-10-CM

## 2015-11-17 DIAGNOSIS — I63412 Cerebral infarction due to embolism of left middle cerebral artery: Secondary | ICD-10-CM | POA: Diagnosis not present

## 2015-11-17 DIAGNOSIS — Z9989 Dependence on other enabling machines and devices: Secondary | ICD-10-CM

## 2015-11-17 DIAGNOSIS — G4761 Periodic limb movement disorder: Secondary | ICD-10-CM

## 2015-11-17 NOTE — Patient Instructions (Signed)
Please continue using your CPAP regularly. While your insurance requires that you use CPAP at least 4 hours each night on 70% of the nights, I recommend, that you not skip any nights and use it throughout the night if you can. Getting used to CPAP and staying with the treatment long term does take time and patience and discipline. Untreated obstructive sleep apnea when it is moderate to severe can have an adverse impact on cardiovascular health and raise her risk for heart disease, arrhythmias, hypertension, congestive heart failure, stroke and diabetes. Untreated obstructive sleep apnea causes sleep disruption, nonrestorative sleep, and sleep deprivation. This can have an impact on your day to day functioning and cause daytime sleepiness and impairment of cognitive function, memory loss, mood disturbance, and problems focussing. Using CPAP regularly can improve these symptoms.  Keep up the good work! I will see you back in 6 months for sleep apnea check up, and if you continue to do well on CPAP I will see you once a year thereafter.   I do need for you to meet with your DME provider, [facility] for a mask refit.   I will increase your pressure to 8 cm, at this time.

## 2015-11-17 NOTE — Progress Notes (Signed)
Subjective:    Patient ID: Henry [name] is a 81 y.o. male.  HPI     Interim history:   Henry [name] is a 81 year old right-handed gentleman with an underlying medical history of bilateral carotid artery stenosis, left MCA stroke in April 2016 with status post endovascular really vascularization through stent placement,  for which he has been seeing my colleague, Dr. Erlinda Hong (last seen on 03/08/15), history of syncope, orthostatic hypotension, hypertension, hyperlipidemia, vitamin D deficiency, and obesity, who presents for follow-up consultation of his obstructive sleep apnea, after his recent sleep studies. The patient is accompanied by his wife today. I first met him on 08/24/2015 at the request of his primary care physician, at which time he reported snoring and excessive daytime somnolence as well as memory problems. I invited him for a sleep study. He had a baseline sleep study, followed by a CPAP titration study. I went over his test results with him in detail today. Baseline sleep study from 09/02/2015 showed a sleep efficiency of 73% with a prolonged sleep latency of 37 minutes and wake after sleep onset of 92 minutes with moderate sleep fragmentation noted. He had an increased percentage of stage I sleep, and REM sleep was 24.8% with a normal REM latency. Total AHI was 22.3 per hour, rising to 55.5 per hour during REM sleep. Average oxygen saturation was 91%, nadir was 79%. He had mild PLMS with an index of 21.1 per hour, without significant arousals. Based on his test results, his medical history and in particular in light of his stroke as well as his sleep related complaints, I invited him for a full night CPAP titration study. He had this on 09/16/2015. Sleep efficiency was 60.3%, sleep latency was 72.5 minutes, wake after sleep onset was 112.5 minutes with several longer periods of wakefulness. Arousal index was normal. He had an increased percentage of slow-wave sleep and an increased percentage  of REM sleep with a normal REM latency. PLMS were mild no significant arousals. Average oxygen saturation was 94%, nadir was 91%. CPAP was titrated from 5 cm to 7 cm. AHI was 0 per hour at the final pressure with nonsupine REM sleep achieved. Based on his test results I prescribed CPAP therapy for home use.  Today, 11/17/2015: I reviewed his CPAP compliance data from 10/17/2015 through 11/15/2015 which is a total of 30 days, during which time he used his machine every night with percent used days greater than 4 hours at 100%, indicating superb compliance with an average usage of 8 hours and 12 minutes, residual AHI 7.1 per hour, suboptimal, leaked low with the 95th percentile at 1.8 L/m on a pressure of 7 cm with EPR of 3.  Today, 11/17/2015: He reports feeling about the same, compliant with treatment, but pressure from the nasal pillows headgear is causing puffiness under both eyes. He tolerates the pressure. Has occasional dizziness, may not always drink enough water.   Previously:  08/24/2015: He reports snoring and some daytime somnolence, memory complaints for the past year. I reviewed your office note from 08/10/2015, which you kindly included.   His Epworth sleepiness score is 3 out of 24 today, his fatigue score is 15 out of 63. He does take a nap almost on a daily basis, usually in the late afternoon. In the mornings he feels fairly adequately rested when he first wakes up. He tries to be in bed by 9:30 and usually wakes up between 5 and 6 AM. He has nocturia usually once  or twice per average night. He denies morning headaches. He has occasional restless leg symptoms in the right leg only, started after his stroke. He has a feeling of fidgetiness in the right leg and moving it helps. He denies any actual pain or numbness on the right side. He has no actual major residual symptoms on the right side with the exception of difficulty with fine motor skills and not always picking up his right foot  while walking. He tries to be active. He walks about 2 miles per day. He drinks 1 glass of wine per day,   He quit smoking about 30 years ago, drinks caffeine in the form of coffee 2-4 cups per day, also some soda during the day but noncaffeine. He has 2 children from his first marriage and 2 children from his current wife. He is retired. He has noted short-term memory issues for the past year or so, really since his stroke. He has some forgetfulness, denies any problems driving.   He has seen cardiologist, Dr. Einar Gip, and workup has been benign per their verbal report. He had a Holter monitor which was benign as well, due to his history of syncope and collapse. He is due to have a 24-hour blood pressure monitor placed. Implantable loop monitor was previously discussed as well, as I understand.   His Past Medical History Is Significant For: Past Medical History:  Diagnosis Date  . Accelerated hypertension 06/29/2014  . Arthritis    Back  . Chronic kidney disease   . Dizziness and giddiness   . Enlarged prostate   . Esophagitis 05/10/2011  . Foley catheter in place   . FOOT SPRAIN, RIGHT 07/18/2007   Qualifier: Diagnosis of  By: Aline Brochure MD, [name]    . GERD (gastroesophageal reflux disease)   . HOH (hard of hearing)   . Hx-TIA (transient ischemic attack)    hx of this- wife states 10 years ago-but Dr. Estil Daft told wife he had had several-has 2 stents in carotid artery-left  . Hypercholesteremia   . Hypertension   . Low back pain   . Nocturia   . Stroke [facility])    had a stroke 05/23/2014  . Syncope and collapse     His Past Surgical History Is Significant For: Past Surgical History:  Procedure Laterality Date  . carotid stents  05/2014   2 stents in carotid artery on left side  . COLONOSCOPY    . COLONOSCOPY  05/17/2011   Procedure: COLONOSCOPY;  Surgeon: [name], MD;  Location: [facility];  Service: Endoscopy;  Laterality: N/A;  830  . epidural steroid injection  05/22/2014    had this in lumbar and at 1 am on 05/23/2014 had a stroke  . ESOPHAGOGASTRODUODENOSCOPY  10/28/2010   Procedure: ESOPHAGOGASTRODUODENOSCOPY (EGD);  Surgeon: [name], MD;  Location: [facility];  Service: Endoscopy;  Laterality: N/A;  10:30  . ESOPHAGOGASTRODUODENOSCOPY N/A 04/24/2014   Procedure: ESOPHAGOGASTRODUODENOSCOPY (EGD);  Surgeon: [name], MD;  [facility]: [facility];  Service: Endoscopy;  Laterality: N/A;  815  . ESOPHAGOGASTRODUODENOSCOPY N/A 06/26/2014   Procedure: ESOPHAGOGASTRODUODENOSCOPY (EGD);  Surgeon: [name], MD;  Location: [facility];  Service: Endoscopy;  Laterality: N/A;  . EUS N/A 06/26/2014   Procedure: FULL UPPER ENDOSCOPIC ULTRASOUND (EUS) RADIAL;  Surgeon: [name], MD;  Location: [facility];  Service: Endoscopy;  Laterality: N/A;  . IR GENERIC HISTORICAL  10/12/2015   IR ANGIO VERTEBRAL SEL VERTEBRAL BILAT MOD SED 10/12/2015 Luanne Bras,  MD [facility]  . IR GENERIC HISTORICAL  10/12/2015   IR ANGIO INTRA EXTRACRAN SEL COM CAROTID INNOMINATE BILAT MOD SED 10/12/2015 Luanne Bras, MD [facility]  . RADIOLOGY WITH ANESTHESIA N/A 05/23/2014   Procedure: RADIOLOGY WITH ANESTHESIA;  Surgeon: Medication Radiologist, MD;  Location: [facility];  Service: Radiology;  Laterality: N/A;  . RESECTION DISTAL CLAVICAL Right 12/13/2012   Procedure: RESECTION DISTAL CLAVICAL;  Surgeon: Carole Civil, MD;  Location: [facility];  Service: Orthopedics;  Laterality: Right;  . SHOULDER ARTHROSCOPY WITH BICEPSTENOTOMY Right 12/13/2012   Procedure: SHOULDER ARTHROSCOPY WITH BICEPS TENOTOMY, limited shoulder debridement;  Surgeon: Carole Civil, MD;  Location: [facility];  Service: Orthopedics;  Laterality: Right;  . SHOULDER SURGERY Right    torn cartiledge  . SHOULDER SURGERY     right shoulder  . TRANSURETHRAL RESECTION OF PROSTATE N/A 07/09/2014   Procedure: TRANSURETHRAL RESECTION of prostate WITH BUTTON ;  Surgeon: [name], MD;  Location: [facility];  Service: Urology;  Laterality: N/A;    His Family History Is Significant For: Family History  Problem Relation Age of Onset  . Thyroid disease Mother   . Colon cancer Neg Hx     His Social History Is Significant For: Social History   Social History  . Marital status: Married    Spouse name: N/A  . Number of children: 4  . Years of education: 25   Occupational History  . retired     [employer]   Social History Main Topics  . Smoking status: Former Smoker    Packs/day: 0.50    Years: 35.00    Types: Cigarettes    Quit date: 12/10/1994  . Smokeless tobacco: Never Used  . Alcohol use 1.8 oz/week    3 Glasses of wine per week  . Drug use: No  . Sexual activity: Yes    Birth control/ protection: None   Other Topics Concern  . None   Social History Narrative   Married, retired - [employer]   Right handed   Caffeine use - 3-4 cups coffee daily    His Allergies Are:  No Known Allergies:   His Current Medications Are:  Outpatient Encounter Prescriptions as of 11/17/2015  Medication Sig  . ALPRAZolam (XANAX) 0.5 MG tablet Take 1 tablet (0.5 mg total) by mouth 3 (three) times daily as needed for anxiety.  Marland Kitchen amLODipine (NORVASC) 10 MG tablet Take 10 mg by mouth daily.   Marland Kitchen aspirin 81 MG tablet Take 81 mg by mouth daily.  Marland Kitchen atorvastatin (LIPITOR) 40 MG tablet Take 1 tablet (40 mg total) by mouth every evening.  . Cholecalciferol (VITAMIN D PO) Take 1 capsule by mouth every evening.  . clopidogrel (PLAVIX) 75 MG tablet TAKE 1 TABLET DAILY  . finasteride (PROSCAR) 5 MG tablet Take 1 tablet (5 mg total) by mouth daily.  . Fish Oil-Cholecalciferol (FISH OIL + D3) 1000-1000 MG-UNIT CAPS Take 1 capsule by mouth daily.  Marland Kitchen lisinopril-hydrochlorothiazide (PRINZIDE,ZESTORETIC) 20-12.5 MG tablet Take 1 tablet by mouth daily.   Marland Kitchen omeprazole (PRILOSEC) 40 MG capsule Take 40 mg by mouth daily.   . [DISCONTINUED] esomeprazole (NEXIUM) 40 MG capsule Take 40 mg by mouth every morning.  Reported on 02/06/2015   No facility-administered encounter medications on file as of 11/17/2015.   :  Review of Systems:  Out of a complete 14 point review of systems, all are reviewed and negative with the exception of these symptoms as listed below:  Review of Systems  Neurological:       Patient is having some trouble with CPAP. States that the pressure is "a little strong". Reports that it causes swelling under his eyes and "does not feel any difference".     Objective:  Neurologic Exam  Physical Exam Physical Examination:   Vitals:   11/17/15 1045  BP: (!) 150/71  Pulse: 61  Resp: 16    General Examination: The patient is a very pleasant 81 y.o. male in no acute distress. He appears well-developed and well-nourished and well groomed.   HEENT: Normocephalic, atraumatic, pupils are equal, round and reactive to light and accommodation. Funduscopic exam is normal with sharp disc margins noted. Extraocular tracking is good without limitation to gaze excursion or nystagmus noted. Normal smooth pursuit is noted. Hearing is mildly impaired, he has bilateral hearing aids in place. Face is slightly asymmetric with possible effacement of right nasolabial fold. Puffiness noted around both eyes, particularly under his right eye. Speech is clear with no dysarthria noted. There is no hypophonia. There is no lip, neck/head, jaw or voice tremor. Neck is supple with full range of passive and active motion. There are no carotid bruits on auscultation. Oropharynx exam reveals: mild mouth dryness, adequate dental hygiene and moderate airway crowding, due to  redundant soft palate, longer uvula, larger tongue, tonsils are either very small or absent? He denies having had a tonsillectomy. Mallampati is class II. Tongue protrudes centrally and palate elevates symmetrically.   Chest: Clear to auscultation without wheezing, rhonchi or crackles noted.  Heart: S1+S2+0, regular and normal without  murmurs, rubs or gallops noted.   Abdomen: Soft, non-tender and non-distended with normal bowel sounds appreciated on auscultation.  Extremities: There is trace edema in the distal lower extremities bilaterally.   Skin: Warm and dry without trophic changes noted. There are no varicose veins.  Musculoskeletal: exam reveals no obvious joint deformities, tenderness or joint swelling or erythema.   Neurologically:  Mental status: The patient is awake, alert and oriented in all 4 spheres. His immediate and remote memory, attention, language skills and fund of knowledge are appropriate. There is no evidence of aphasia, agnosia, apraxia or anomia. Speech is clear with normal prosody and enunciation. Thought process is linear. Mood is normal and affect is normal.  Cranial nerves II - XII are as described above under HEENT exam. In addition: shoulder shrug is normal with equal shoulder height noted. Motor exam: Normal bulk, strength and tone is noted. There is no drift, tremor or rebound. Romberg is negative. Reflexes are 1-2+ throughout, absent in both ankles, right side a little more prominent than left as far as reflexes in his arm and knee. Fine motor skills and coordination: mild  difficulty with the right upper and lower extremity, normal on the left.  Cerebellar testing: No dysmetria or intention tremor on finger to nose testing. Sensory exam: Intact in the upper extremities, unchanged in the lower extremities.   Gait, station and balance: He stands easily. No veering to one side is noted. No leaning to one side is noted. Posture is age-appropriate and stance is narrow based. Gait shows normal stride length and normal pace. No problems turning are noted.   Assessment and Plan:  In summary, ISAIR INABINET is a very pleasant 81 year old male with an underlying medical history of bilateral carotid artery stenosis, left MCA stroke in April 2016 with status post endovascular re-vascularization through  stent placement (followed by Dr. Erlinda Hong), history of syncope,  orthostatic hypotension, hypertension, hyperlipidemia, vitamin D deficiency, and obesity, who presents for follow-up consultation of his obstructive sleep apnea, after his baseline sleep study in July 2017 and his CPAP titration study in August 2017. We talked about his test results in detail today. He has evidence of moderate to severe obstructive sleep apnea, well treated with CPAP. Current AHI slightly suboptimal around 7 per hour, I would like to increase his pressure from 7 cm to 8 cm. He has had some facial swelling and puffiness around his eyes from the headgear of the CPAP mask and we will request a mask refit through his DME company. He is agreeable to increasing the pressure. He is commended for his full compliance with treatment. He does not have any telltale changes in his symptoms after treatment but is willing to continue with treatment, particularly in light of his medical history including stroke history.  Physical exam is stable. He is reminded to stay active physically and stay well-hydrated with water. We reviewed his compliance data together as well. At this juncture, I suggested a 6 month follow-up, sooner as needed. I answered all their questions today and the patient and his wife were in agreement. I spent 25 minutes in total face-to-face time with the patient, more than 50% of which was spent in counseling and coordination of care, reviewing test results, reviewing medication and discussing or reviewing the diagnosis of OSA, its prognosis and treatment options.

## 2015-11-19 DIAGNOSIS — M545 Low back pain: Secondary | ICD-10-CM | POA: Diagnosis not present

## 2015-11-19 DIAGNOSIS — M546 Pain in thoracic spine: Secondary | ICD-10-CM | POA: Diagnosis not present

## 2015-11-19 DIAGNOSIS — M9905 Segmental and somatic dysfunction of pelvic region: Secondary | ICD-10-CM | POA: Diagnosis not present

## 2015-11-19 DIAGNOSIS — M9902 Segmental and somatic dysfunction of thoracic region: Secondary | ICD-10-CM | POA: Diagnosis not present

## 2015-11-19 DIAGNOSIS — M9903 Segmental and somatic dysfunction of lumbar region: Secondary | ICD-10-CM | POA: Diagnosis not present

## 2015-11-30 DIAGNOSIS — Z125 Encounter for screening for malignant neoplasm of prostate: Secondary | ICD-10-CM | POA: Diagnosis not present

## 2015-11-30 DIAGNOSIS — I10 Essential (primary) hypertension: Secondary | ICD-10-CM | POA: Diagnosis not present

## 2015-11-30 DIAGNOSIS — E784 Other hyperlipidemia: Secondary | ICD-10-CM | POA: Diagnosis not present

## 2015-12-06 DIAGNOSIS — Z23 Encounter for immunization: Secondary | ICD-10-CM | POA: Diagnosis not present

## 2015-12-06 DIAGNOSIS — Z0000 Encounter for general adult medical examination without abnormal findings: Secondary | ICD-10-CM | POA: Diagnosis not present

## 2015-12-06 DIAGNOSIS — E784 Other hyperlipidemia: Secondary | ICD-10-CM | POA: Diagnosis not present

## 2015-12-06 DIAGNOSIS — Z8679 Personal history of other diseases of the circulatory system: Secondary | ICD-10-CM | POA: Diagnosis not present

## 2015-12-06 DIAGNOSIS — F0390 Unspecified dementia without behavioral disturbance: Secondary | ICD-10-CM | POA: Diagnosis not present

## 2015-12-06 DIAGNOSIS — E668 Other obesity: Secondary | ICD-10-CM | POA: Diagnosis not present

## 2015-12-06 DIAGNOSIS — K5909 Other constipation: Secondary | ICD-10-CM | POA: Diagnosis not present

## 2015-12-06 DIAGNOSIS — G4733 Obstructive sleep apnea (adult) (pediatric): Secondary | ICD-10-CM | POA: Diagnosis not present

## 2015-12-06 DIAGNOSIS — Z6836 Body mass index (BMI) 36.0-36.9, adult: Secondary | ICD-10-CM | POA: Diagnosis not present

## 2015-12-06 DIAGNOSIS — I10 Essential (primary) hypertension: Secondary | ICD-10-CM | POA: Diagnosis not present

## 2015-12-10 DIAGNOSIS — M9903 Segmental and somatic dysfunction of lumbar region: Secondary | ICD-10-CM | POA: Diagnosis not present

## 2015-12-10 DIAGNOSIS — M546 Pain in thoracic spine: Secondary | ICD-10-CM | POA: Diagnosis not present

## 2015-12-10 DIAGNOSIS — M9902 Segmental and somatic dysfunction of thoracic region: Secondary | ICD-10-CM | POA: Diagnosis not present

## 2015-12-10 DIAGNOSIS — M545 Low back pain: Secondary | ICD-10-CM | POA: Diagnosis not present

## 2015-12-10 DIAGNOSIS — M9905 Segmental and somatic dysfunction of pelvic region: Secondary | ICD-10-CM | POA: Diagnosis not present

## 2015-12-13 DIAGNOSIS — L570 Actinic keratosis: Secondary | ICD-10-CM | POA: Diagnosis not present

## 2015-12-13 DIAGNOSIS — L918 Other hypertrophic disorders of the skin: Secondary | ICD-10-CM | POA: Diagnosis not present

## 2015-12-13 DIAGNOSIS — X32XXXD Exposure to sunlight, subsequent encounter: Secondary | ICD-10-CM | POA: Diagnosis not present

## 2015-12-28 DIAGNOSIS — M545 Low back pain: Secondary | ICD-10-CM | POA: Diagnosis not present

## 2015-12-28 DIAGNOSIS — M546 Pain in thoracic spine: Secondary | ICD-10-CM | POA: Diagnosis not present

## 2015-12-28 DIAGNOSIS — M9902 Segmental and somatic dysfunction of thoracic region: Secondary | ICD-10-CM | POA: Diagnosis not present

## 2015-12-28 DIAGNOSIS — M9903 Segmental and somatic dysfunction of lumbar region: Secondary | ICD-10-CM | POA: Diagnosis not present

## 2015-12-28 DIAGNOSIS — M9905 Segmental and somatic dysfunction of pelvic region: Secondary | ICD-10-CM | POA: Diagnosis not present

## 2016-03-08 ENCOUNTER — Encounter: Payer: Self-pay | Admitting: Nurse Practitioner

## 2016-03-08 ENCOUNTER — Ambulatory Visit (INDEPENDENT_AMBULATORY_CARE_PROVIDER_SITE_OTHER): Payer: Medicare Other | Admitting: Nurse Practitioner

## 2016-03-08 VITALS — BP 140/80 | HR 63 | Ht 68.0 in | Wt 235.4 lb

## 2016-03-08 DIAGNOSIS — E785 Hyperlipidemia, unspecified: Secondary | ICD-10-CM | POA: Diagnosis not present

## 2016-03-08 DIAGNOSIS — I639 Cerebral infarction, unspecified: Secondary | ICD-10-CM | POA: Diagnosis not present

## 2016-03-08 DIAGNOSIS — I10 Essential (primary) hypertension: Secondary | ICD-10-CM | POA: Diagnosis not present

## 2016-03-08 NOTE — Progress Notes (Signed)
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 male hx of HTN, HLD presenting to [facility] with syncopal episode on 05/23/2014. Upon arrival found to be aphasic with right sided weakness. NIHSS = 15. Outside IV tPA window by time of arrival. A CTA showed left ICA proximal occlusion and the patient was transferred to [facility] for possible IR intervention. CT perfusion study showed large penumbra, he was taking to IR, and mechanical thrombectomy was performed. Left ICA and MCA recannulized, and left ICA stenting was performed. MRI showed left MCA territory infarct, and MRI showed wide patent left ICA and MCA. Carotid Doppler showed left ICA stent patent, and right ICA 50-60% stenosis. 2-D echo unremarkable. LDL 59 and A1c 6.2.  LV DVT negative. He was extubated on 05/25/2014 and his right hemiparesis improved nicely, however he still has severe dysarthria and aphasia. He passed swallow on discharge, was put on pured diet. Hwas put on ASA and Plavix for 3 months, and then Plavix monotherapy lifetime. He was discharged to CIR in good condition.  Follow up 06/29/14 -Dr Henry [name] the patient has been doing well from a neurological standpoint. He was discharged from [facility] on 06/04/2014. His speech much improved without dysarthria or aphasia. However he developed hematuria and the urinary retention in rehabilitation and foley catheter was placed and urology consult was requested. He continued to follow up with urology after discharge and went to ER twice for urinary retention, so far still with foley catheter and was found to have UTI. He is going to start Abx and will see urology next week for cystoscopy. He also had MRCP last week for questionable pancreas lesion and was cleared by GI. For the procedures, he was currently only on ASA 81  and plavix on hold. Wife concerns that if he is on dural antiplatelet, he will have hematuria again. His BP was high today 188/88, but wife said at home he only at 140-160. Pt is somehow agitated by the foley catheter in the clinic today.  Follow up 08/28/14 Dr. Erlinda [name]- he was doing well without recurrent symptoms. He had cystoscopy and prostate procedure and hematuria resolved. He is still on dural antiplatelet now. He had repeat CUS in 07/2014 showed right ICA 50-69% and left ICA patent with stent. He has no complains. Finished speech therapy and going to have PT to help him "go back to golf course". BP today in clinic 170/81 but at home per wife is always 130s.   Interval History1/23/17 Dr Henry [name] During the interval time, he has been doing well except syncope episode on 02/06/15. He finished breakfast and sitting in chair. He tried to get up for medication but he felt not well, able to pass out and he sat down and then passed out as observed by his wife. EMS on arrival BP 100/40 and HR 40s. He was put on stretcher and sent to [facility] ED. On ED arrival, he back to baseline. BP at 140s, however on standing up BP down to 90s but no symptoms. He had CTA showed left carotid stent patent, right carotid 50% stenosis. He was discharged with outpatient carotid Doppler as well as cardiology follow-up. He had carotid Doppler on 02/09/2015 showed left stent patent with 50-75% stenosis (likely due to high velocity with stenting) and right 40-59% stenosis. He also follows  with Dr. Einar [name], had a stress test and 30 day cardiac monitoring, results pending. Since then, he has been doing well, no recurrent episode. He also had follow-up on 02/01/2015 with Dr.Deveshwar, put back on DAPT and recommend further carotid doppler monitoring. BP today 158/72 in clinic. He stated that at home his blood pressure always around 145/73. UPDATE 07/24/2017CM Henry [name], 82 year old male returns for follow-up with his wife. He has a history of stroke  event in April 2016. He has not had further stroke or TIA symptoms. He is on dual antiplatelet aspirin and Plavix. Recent carotid Doppler without change in April 2017. He has had sleep study one week ago, results are not ready . Blood pressure today in clinic 130/71 however he does still complain with dizziness occasionally standing. He continues to take his home blood pressures and had recent 24-hour blood pressure monitoring by Dr. Sharlett [name]. He remains on Lipitor without complaints of muscle aches. He is exercising by walking 2 miles a day. He returns for reevaluation UPDATE 01/24/2018CM Henry [name] 82 year old male returns for follow-up with his wife. He has a history of stroke which occurred in April 2016. He is currently on Plavix and aspirin for secondary stroke prevention without further stroke or TIA symptoms since that time. Since last seen he has been diagnosed with obstructive sleep apnea and is currently on CPAP tolerating it well. Blood pressure in the clinic today 140/80. He remains on Lipitor without complaints of muscle aches or myalgias. He continues to walk 2 miles a day for exercise. Recent bilateral common carotid angiography by Dr. Estanislado [name] on 10/12/15 shows approximately 65-70% stenosis of the right common carotid artery. 50% stent stenosis of the right external carotid artery at its origin. Angiography occluded left external carotid artery at its origin a follow-up ultrasound of the neck will be done in approximately 6 months from that time or the end of February. Patient returns for reevaluation    REVIEW OF SYSTEMS: Full 14 system review of systems performed and notable only for those listed, all others are neg:  Constitutional: neg  Cardiovascular: neg Ear/Nose/Throat: Hearing loss Skin: neg Eyes: neg Respiratory: neg Gastroitestinal: neg  Genitourinary frequency Hematology/Lymphatic: Easy bruising Endocrine: Intolerance to cold Musculoskeletal: neg Allergy/Immunology:  neg Neurological: neg Psychiatric: neg Sleep : Obstructive sleep apnea with CPAP ALLERGIES: No Known Allergies  HOME MEDICATIONS: Outpatient Medications Prior to Visit  Medication Sig Dispense Refill  . ALPRAZolam (XANAX) 0.5 MG tablet Take 1 tablet (0.5 mg total) by mouth 3 (three) times daily as needed for anxiety. 30 tablet 0  . amLODipine (NORVASC) 10 MG tablet Take 10 mg by mouth daily.     Marland Kitchen aspirin 81 MG tablet Take 81 mg by mouth daily.    Marland Kitchen atorvastatin (LIPITOR) 40 MG tablet Take 1 tablet (40 mg total) by mouth every evening. 30 tablet 1  . Cholecalciferol (VITAMIN D PO) Take 1 capsule by mouth every evening.    . clopidogrel (PLAVIX) 75 MG tablet TAKE 1 TABLET DAILY 90 tablet 3  . finasteride (PROSCAR) 5 MG tablet Take 1 tablet (5 mg total) by mouth daily. 30 tablet 11  . Fish Oil-Cholecalciferol (FISH OIL + D3) 1000-1000 MG-UNIT CAPS Take 1 capsule by mouth daily.    Marland Kitchen lisinopril-hydrochlorothiazide (PRINZIDE,ZESTORETIC) 20-12.5 MG tablet Take 1 tablet by mouth daily.     Marland Kitchen omeprazole (PRILOSEC) 40 MG capsule Take 40 mg by mouth daily.      No facility-administered medications prior to visit.  PAST MEDICAL HISTORY: Past Medical History:  Diagnosis Date  . Accelerated hypertension 06/29/2014  . Arthritis    Back  . Chronic kidney disease   . Dizziness and giddiness   . Enlarged prostate   . Esophagitis 05/10/2011  . Foley catheter in place   . FOOT SPRAIN, RIGHT 07/18/2007   Qualifier: Diagnosis of  By: Aline Brochure MD, [name]    . GERD (gastroesophageal reflux disease)   . HOH (hard of hearing)   . Hx-TIA (transient ischemic attack)    hx of this- wife states 10 years ago-but Dr. Estil Daft told wife he had had several-has 2 stents in carotid artery-left  . Hypercholesteremia   . Hypertension   . Low back pain   . Nocturia   . Stroke [facility])    had a stroke 05/23/2014  . Syncope and collapse     PAST SURGICAL HISTORY: Past Surgical History:  Procedure  Laterality Date  . carotid stents  05/2014   2 stents in carotid artery on left side  . COLONOSCOPY    . COLONOSCOPY  05/17/2011   Procedure: COLONOSCOPY;  Surgeon: [name], MD;  [facility];  Service: Endoscopy;  Laterality: N/A;  830  . epidural steroid injection  05/22/2014   had this in lumbar and at 1 am on 05/23/2014 had a stroke  . ESOPHAGOGASTRODUODENOSCOPY  10/28/2010   Procedure: ESOPHAGOGASTRODUODENOSCOPY (EGD);  Surgeon: [name], MD;  [facility];  Service: Endoscopy;  Laterality: N/A;  10:30  . ESOPHAGOGASTRODUODENOSCOPY N/A 04/24/2014   Procedure: ESOPHAGOGASTRODUODENOSCOPY (EGD);  Surgeon: [name], MD;  [facility];  Service: Endoscopy;  Laterality: N/A;  815  . ESOPHAGOGASTRODUODENOSCOPY N/A 06/26/2014   Procedure: ESOPHAGOGASTRODUODENOSCOPY (EGD);  Surgeon: [name], MD;  Location: [facility];  Service: Endoscopy;  Laterality: N/A;  . EUS N/A 06/26/2014   Procedure: FULL UPPER ENDOSCOPIC ULTRASOUND (EUS) RADIAL;  Surgeon: [name], MD;  Location: [facility];  Service: Endoscopy;  Laterality: N/A;  . IR GENERIC HISTORICAL  10/12/2015   IR ANGIO VERTEBRAL SEL VERTEBRAL BILAT MOD SED 10/12/2015 Luanne Bras, MD [facility]  . IR GENERIC HISTORICAL  10/12/2015   IR ANGIO INTRA EXTRACRAN SEL COM CAROTID INNOMINATE BILAT MOD SED 10/12/2015 Luanne Bras, MD [facility]  . RADIOLOGY WITH ANESTHESIA N/A 05/23/2014   Procedure: RADIOLOGY WITH ANESTHESIA;  Surgeon: Medication Radiologist, MD;  Location: [facility];  Service: Radiology;  Laterality: N/A;  . RESECTION DISTAL CLAVICAL Right 12/13/2012   Procedure: RESECTION DISTAL CLAVICAL;  Surgeon: Carole Civil, MD;  Location: [facility];  Service: Orthopedics;  Laterality: Right;  . SHOULDER ARTHROSCOPY WITH BICEPSTENOTOMY Right 12/13/2012   Procedure: SHOULDER ARTHROSCOPY WITH BICEPS TENOTOMY, limited shoulder debridement;  Surgeon: Carole Civil, MD;   Location: [facility];  Service: Orthopedics;  Laterality: Right;  . SHOULDER SURGERY Right    torn cartiledge  . SHOULDER SURGERY     right shoulder  . TRANSURETHRAL RESECTION OF PROSTATE N/A 07/09/2014   Procedure: TRANSURETHRAL RESECTION of prostate WITH BUTTON ;  Surgeon: [name], MD;  Location: [facility];  Service: Urology;  Laterality: N/A;    FAMILY HISTORY: Family History  Problem Relation Age of Onset  . Thyroid disease Mother   . Colon cancer Neg Hx     SOCIAL HISTORY: Social History   Social History  . Marital status: Married    Spouse name: N/A  . Number of children: 4  . Years of education: 85  Occupational History  . retired     [employer]   Social History Main Topics  . Smoking status: Former Smoker    Packs/day: 0.50    Years: 35.00    Types: Cigarettes    Quit date: 12/10/1994  . Smokeless tobacco: Never Used  . Alcohol use 1.8 oz/week    3 Glasses of wine per week  . Drug use: No  . Sexual activity: Yes    Birth control/ protection: None   Other Topics Concern  . Not on file   Social History Narrative   Married, retired - [employer]   Right handed   Caffeine use - 3-4 cups coffee daily     PHYSICAL EXAM  Vitals:   03/08/16 1112  BP: (!) 156/74  Pulse: 63  Weight: 235 lb 6.4 oz (106.8 kg)  Height: 5\' 8"  (1.727 m)   Body mass index is 35.79 kg/m.  Generalized: Well developed, obese male in no acute distress  Head: normocephalic and atraumatic,. Oropharynx benign  Neck: Supple, no carotid bruits  Cardiac: Regular rate rhythm, no murmur  Musculoskeletal: No deformity   Neurological examination   Mentation: Alert oriented to time, place, history taking. Attention span and concentration appropriate. Recent and remote memory intact.  Follows all commands speech and language fluent.   Cranial nerve II-XII: Pupils were equal round reactive to light extraocular movements were full, visual field were full on confrontational test. Facial  sensation and strength were normal. hearing was intact to finger rubbing bilaterally. Uvula tongue midline. head turning and shoulder shrug were normal and symmetric.Tongue protrusion into cheek strength was normal. Motor: normal bulk and tone, full strength in the BUE, BLE, fine finger movements normal, no pronator drift. No focal weakness Sensory: normal and symmetric to light touch, pinprick, and  Vibration, In the upper and lower extremities Coordination: finger-nose-finger, heel-to-shin bilaterally, no dysmetria Reflexes: 1+ upper lower and symmetric, plantar responses were flexor bilaterally. Gait and Station: Rising up from seated position without assistance, normal stance,  moderate stride, good arm swing, smooth turning, able to perform tiptoe, and heel walking without difficulty. Tandem gait is steady  DIAGNOSTIC DATA (LABS, IMAGING, TESTING) - I reviewed patient records, labs, notes, testing and imaging myself where available.  Lab Results  Component Value Date   WBC 7.2 10/12/2015   HGB 13.4 10/12/2015   HCT 42.0 10/12/2015   MCV 86.4 10/12/2015   PLT 263 10/12/2015      Component Value Date/Time   NA 139 10/12/2015 0656   NA 141 10/11/2010   K 3.6 10/12/2015 0656   CL 105 10/12/2015 0656   CO2 26 10/12/2015 0656   GLUCOSE 100 (H) 10/12/2015 0656   BUN 13 10/12/2015 0656   BUN 21 10/11/2010   CREATININE 0.96 10/12/2015 0656   CALCIUM 9.2 10/12/2015 0656   PROT 5.8 (L) 07/06/2014 0445   ALBUMIN 2.9 (L) 07/06/2014 0445   AST 24 07/06/2014 0445   ALT 27 07/06/2014 0445   ALKPHOS 49 07/06/2014 0445   BILITOT 0.5 07/06/2014 0445   GFRNONAA >60 10/12/2015 0656   GFRAA >60 10/12/2015 0656   Lab Results  Component Value Date   CHOL 110 05/24/2014   HDL 35 (L) 05/24/2014   LDLCALC 59 05/24/2014   TRIG 79 05/24/2014   CHOLHDL 3.1 05/24/2014   Lab Results  Component Value Date   HGBA1C 6.2 (H) 05/24/2014   No results found for: [id number] Lab Results  Component  Value Date   TSH 0.73 10/11/2010  Carotid Doppler 05/24/2014 Right: ECA stenosis. 40-59% internal carotid artery stenosis by velocities. But ICA/CCA ratio of 3.07 suggests stenosis in the 60-79% range. Left: <50% stenosis in the stent. Bilateral: Vertebral artery flow is antegrade. Carotid doppler 07/21/14 - right ICA 50-69% stenosis, left ICA s/p stent and patent. Carotid Doppler 02/09/2015 - Right 40-59% internal carotid artery stenosis due to end diastolic. Left Post carotid stent 50%-75% internal carotid artery. Vertebral antegraded bilaterally. Carotid Doppler 04/10/2017Right: ECA stenosis. 40-59% ICA stenosis by velocities. ICA/CCA ratio of 3.07 suggests stenosis in the 60-79% range. Left: <50% ICA stenosis in the stent. Bilateral: Vertebral artery flow is antegrade. Bilateral common carotid angiography by Dr. Estanislado [name] on 10/12/15 shows approximately 65-70% stenosis of the right common carotid artery. 50% stent stenosis of the right external carotid artery at its origin. Angiography occluded left external carotid artery at its origin a follow-up ultrasound of the neck will be done in approximately 6 months from that time or the end of February. ASSESSMENT AND PLAN  82 y.o. Caucasian male with PMH of HTN, HLD was admitted on 05/23/14 for left MCA stroke due to left ICA and MCA tandem occlusion.IR performed mechanical thrombectomy and achieved recannulization of left ICA and MCA. Left ICA stenting was done also. Marland Kitchen He was put on aspirin and Plavix dual antiplatelet and discharged to CIR. Repeat carotid Doppler showed patent left ICA stent and right ICA 50-69%. Finished 3 months of DAPT and continued on Plavix. Follow up with Dr. Estanislado [name] in 01/30/15 and put back on DAPT. Had syncope episode due to orthostatic hypotension on 02/06/2015, repeat CUS shows stable right ICA 40-59% stenosis and CTA head and neck showed right ICA 50% stenosis and left ICA stent patent. 05/24/2015 CUS Right: ECA  stenosis. 40-59% ICA stenosis by velocities. ICA/CCA ratio of 3.07 suggests stenosis in the 60-79% range. Left: <50% ICA stenosis in the stent. Bilateral: Vertebral artery flow is antegrade. 10/12/15 CUS shows approximately 65-70% stenosis of the right common carotid artery. 50% stent stenosis of the right external carotid artery at its origin. Angiography occluded left external carotid artery at its origin a follow-up ultrasound of the neck will be done in Feb 2018 by Dr. Particia Lather  PLAN: continue ASA and plavix  for stroke prevention   BP goal 120-150 due to carotid stenosis. Today's reading 140/80, continue blood pressure medications - Follow up with your primary care physician for stroke risk factor modification.  lipids with LDL cholesterol goal below 70 mg/dL. continue Lipitor Continue follow-up with Dr. Estanislado [name]  Continue CPAP for obstructive sleep apnea follow-up with Dr Rexene Alberts appt in April Discharge from stroke clinic Discussed POC with Dr. Woody Seller Cecille Rubin, [facility], [facility] [address] [place], [place] [address] [phone]

## 2016-03-08 NOTE — Patient Instructions (Signed)
continue ASA and plavix  for stroke prevention   BP goal 120-150 due to carotid stenosis. Today's reading 140/80, continue blood pressure medications - Follow up with your primary care physician for stroke risk factor modification.  lipids with LDL cholesterol goal below 70 mg/dL. continue Lipitor Continue follow-up with Dr. Estanislado Pandy  Continue CPAP for obstructive sleep apnea follow-up with Dr Rexene Alberts appt in April Discharge from stroke clinic

## 2016-03-11 NOTE — Progress Notes (Signed)
I reviewed above note and agree with the assessment and plan.  Rosalin Hawking, MD PhD Stroke Neurology 03/11/2016 5:00 PM

## 2016-03-16 ENCOUNTER — Encounter: Payer: Self-pay | Admitting: Cardiology

## 2016-03-20 ENCOUNTER — Other Ambulatory Visit (HOSPITAL_COMMUNITY): Payer: Self-pay | Admitting: Interventional Radiology

## 2016-03-20 ENCOUNTER — Telehealth (HOSPITAL_COMMUNITY): Payer: Self-pay

## 2016-03-20 DIAGNOSIS — I771 Stricture of artery: Secondary | ICD-10-CM

## 2016-03-27 ENCOUNTER — Ambulatory Visit (HOSPITAL_COMMUNITY)
Admission: RE | Admit: 2016-03-27 | Discharge: 2016-03-27 | Disposition: A | Discharge status: Home / Self Care | Payer: Medicare Other | Source: Ambulatory Visit | Attending: Interventional Radiology | Admitting: Interventional Radiology

## 2016-03-27 DIAGNOSIS — I771 Stricture of artery: Secondary | ICD-10-CM | POA: Diagnosis not present

## 2016-03-27 DIAGNOSIS — Z95828 Presence of other vascular implants and grafts: Secondary | ICD-10-CM | POA: Insufficient documentation

## 2016-03-27 DIAGNOSIS — I6522 Occlusion and stenosis of left carotid artery: Secondary | ICD-10-CM | POA: Diagnosis not present

## 2016-03-27 LAB — VAS US CAROTID
LEFT ECA DIAS: -5 cm/s
LEFT VERTEBRAL DIAS: -9 cm/s
Left CCA dist dias: -7 cm/s
Left CCA dist sys: -53 cm/s
Left CCA prox dias: 10 cm/s
Left CCA prox sys: 76 cm/s
Left ICA dist dias: -19 cm/s
Left ICA dist sys: -101 cm/s
Left ICA prox dias: -12 cm/s
Left ICA prox sys: -47 cm/s
RCCADSYS: -131 cm/s
RCCAPDIAS: 14 cm/s
RIGHT ECA DIAS: -18 cm/s
RIGHT VERTEBRAL DIAS: 9 cm/s
Right CCA prox sys: 77 cm/s

## 2016-03-27 NOTE — Progress Notes (Signed)
VASCULAR LAB PRELIMINARY  PRELIMINARY  PRELIMINARY  PRELIMINARY  Carotid duplex completed.    Preliminary report:  1-39% right ICA stenosis, highest end of scale.  There has been an increase in right diastolic velocities since study done 07/27/15.  Left stent is patent and velocities are within normal limits. Vertebral artery flow is antegrade.   Kizzi Overbey, RVT 03/27/2016, 11:22 AM

## 2016-04-12 DIAGNOSIS — M9905 Segmental and somatic dysfunction of pelvic region: Secondary | ICD-10-CM | POA: Diagnosis not present

## 2016-04-12 DIAGNOSIS — M546 Pain in thoracic spine: Secondary | ICD-10-CM | POA: Diagnosis not present

## 2016-04-12 DIAGNOSIS — M545 Low back pain: Secondary | ICD-10-CM | POA: Diagnosis not present

## 2016-04-12 DIAGNOSIS — M9902 Segmental and somatic dysfunction of thoracic region: Secondary | ICD-10-CM | POA: Diagnosis not present

## 2016-04-12 DIAGNOSIS — M9903 Segmental and somatic dysfunction of lumbar region: Secondary | ICD-10-CM | POA: Diagnosis not present

## 2016-05-11 DIAGNOSIS — I10 Essential (primary) hypertension: Secondary | ICD-10-CM | POA: Diagnosis not present

## 2016-05-11 DIAGNOSIS — Z8673 Personal history of transient ischemic attack (TIA), and cerebral infarction without residual deficits: Secondary | ICD-10-CM | POA: Diagnosis not present

## 2016-05-11 DIAGNOSIS — R55 Syncope and collapse: Secondary | ICD-10-CM | POA: Diagnosis not present

## 2016-05-11 DIAGNOSIS — R011 Cardiac murmur, unspecified: Secondary | ICD-10-CM | POA: Diagnosis not present

## 2016-05-17 ENCOUNTER — Ambulatory Visit: Payer: Medicare Other | Admitting: Neurology

## 2016-05-25 DIAGNOSIS — R011 Cardiac murmur, unspecified: Secondary | ICD-10-CM | POA: Diagnosis not present

## 2016-05-31 DIAGNOSIS — R600 Localized edema: Secondary | ICD-10-CM | POA: Diagnosis not present

## 2016-05-31 DIAGNOSIS — I350 Nonrheumatic aortic (valve) stenosis: Secondary | ICD-10-CM | POA: Diagnosis not present

## 2016-05-31 DIAGNOSIS — I10 Essential (primary) hypertension: Secondary | ICD-10-CM | POA: Diagnosis not present

## 2016-05-31 DIAGNOSIS — E7800 Pure hypercholesterolemia, unspecified: Secondary | ICD-10-CM | POA: Diagnosis not present

## 2016-06-02 DIAGNOSIS — M9905 Segmental and somatic dysfunction of pelvic region: Secondary | ICD-10-CM | POA: Diagnosis not present

## 2016-06-02 DIAGNOSIS — M545 Low back pain: Secondary | ICD-10-CM | POA: Diagnosis not present

## 2016-06-02 DIAGNOSIS — M546 Pain in thoracic spine: Secondary | ICD-10-CM | POA: Diagnosis not present

## 2016-06-02 DIAGNOSIS — M9903 Segmental and somatic dysfunction of lumbar region: Secondary | ICD-10-CM | POA: Diagnosis not present

## 2016-06-02 DIAGNOSIS — M9902 Segmental and somatic dysfunction of thoracic region: Secondary | ICD-10-CM | POA: Diagnosis not present

## 2016-06-13 DIAGNOSIS — R600 Localized edema: Secondary | ICD-10-CM | POA: Diagnosis not present

## 2016-06-13 DIAGNOSIS — I951 Orthostatic hypotension: Secondary | ICD-10-CM | POA: Diagnosis not present

## 2016-06-13 DIAGNOSIS — I10 Essential (primary) hypertension: Secondary | ICD-10-CM | POA: Diagnosis not present

## 2016-06-13 DIAGNOSIS — I350 Nonrheumatic aortic (valve) stenosis: Secondary | ICD-10-CM | POA: Diagnosis not present

## 2016-06-27 DIAGNOSIS — M9902 Segmental and somatic dysfunction of thoracic region: Secondary | ICD-10-CM | POA: Diagnosis not present

## 2016-06-27 DIAGNOSIS — M9903 Segmental and somatic dysfunction of lumbar region: Secondary | ICD-10-CM | POA: Diagnosis not present

## 2016-06-27 DIAGNOSIS — M546 Pain in thoracic spine: Secondary | ICD-10-CM | POA: Diagnosis not present

## 2016-06-27 DIAGNOSIS — M545 Low back pain: Secondary | ICD-10-CM | POA: Diagnosis not present

## 2016-06-27 DIAGNOSIS — M9905 Segmental and somatic dysfunction of pelvic region: Secondary | ICD-10-CM | POA: Diagnosis not present

## 2016-07-05 DIAGNOSIS — I10 Essential (primary) hypertension: Secondary | ICD-10-CM | POA: Diagnosis not present

## 2016-07-05 DIAGNOSIS — H35033 Hypertensive retinopathy, bilateral: Secondary | ICD-10-CM | POA: Diagnosis not present

## 2016-07-05 DIAGNOSIS — H5203 Hypermetropia, bilateral: Secondary | ICD-10-CM | POA: Diagnosis not present

## 2016-07-05 DIAGNOSIS — H52223 Regular astigmatism, bilateral: Secondary | ICD-10-CM | POA: Diagnosis not present

## 2016-07-05 DIAGNOSIS — H524 Presbyopia: Secondary | ICD-10-CM | POA: Diagnosis not present

## 2016-07-13 DIAGNOSIS — I10 Essential (primary) hypertension: Secondary | ICD-10-CM | POA: Diagnosis not present

## 2016-07-13 DIAGNOSIS — E871 Hypo-osmolality and hyponatremia: Secondary | ICD-10-CM | POA: Diagnosis not present

## 2016-07-13 DIAGNOSIS — R600 Localized edema: Secondary | ICD-10-CM | POA: Diagnosis not present

## 2016-07-13 DIAGNOSIS — I951 Orthostatic hypotension: Secondary | ICD-10-CM | POA: Diagnosis not present

## 2016-07-19 ENCOUNTER — Encounter: Payer: Self-pay | Admitting: Neurology

## 2016-07-24 ENCOUNTER — Ambulatory Visit (INDEPENDENT_AMBULATORY_CARE_PROVIDER_SITE_OTHER): Payer: Medicare Other | Admitting: Neurology

## 2016-07-24 ENCOUNTER — Encounter: Payer: Self-pay | Admitting: Neurology

## 2016-07-24 VITALS — BP 140/77 | HR 54 | Ht 67.0 in | Wt 229.0 lb

## 2016-07-24 DIAGNOSIS — Z77122 Contact with and (suspected) exposure to noise: Secondary | ICD-10-CM | POA: Diagnosis not present

## 2016-07-24 DIAGNOSIS — I639 Cerebral infarction, unspecified: Secondary | ICD-10-CM | POA: Diagnosis not present

## 2016-07-24 DIAGNOSIS — Z9989 Dependence on other enabling machines and devices: Secondary | ICD-10-CM | POA: Diagnosis not present

## 2016-07-24 DIAGNOSIS — G4733 Obstructive sleep apnea (adult) (pediatric): Secondary | ICD-10-CM

## 2016-07-24 DIAGNOSIS — Z822 Family history of deafness and hearing loss: Secondary | ICD-10-CM | POA: Diagnosis not present

## 2016-07-24 DIAGNOSIS — H903 Sensorineural hearing loss, bilateral: Secondary | ICD-10-CM | POA: Diagnosis not present

## 2016-07-24 NOTE — Progress Notes (Signed)
Subjective:    Patient ID: Henry [name] is a 82 y.o. male.  HPI     Interim history:   Henry [name] is a 82 year old right-handed gentleman with an underlying medical history of bilateral carotid artery stenosis, left MCA stroke in April 2016 with status post endovascular really vascularization through stent placement,  for which he has been seeing my colleague, Dr. Erlinda Hong (last seen on 03/08/15), history of syncope, orthostatic hypotension, hypertension, hyperlipidemia, vitamin D deficiency, and obesity, who presents for follow-up consultation of his obstructive sleep apnea, on treatment with CPAP. The patient is accompanied by his wife again today. I last saw him on 11/17/2015, at which time we discussed his sleep study results from his baseline sleep study as well as his CPAP titration study. He was compliant with treatment.  Today, 07/24/2016 (all dictated new, as well as above notes, some dictation done in note pad or Word, outside of chart, may appear as copied):  I reviewed his CPAP compliance data from 06/20/2016 through 07/19/2016, which is a total of 30 days, during which time he used his CPAP every night with percent used days greater than 4 hours at 100%, indicating superb compliance with an average usage of 8 hours and 6 minutes, residual AHI 4.9 per hour, leak on the low side with the 95th percentile at 7 L/m on a pressure of 8 cm with EPR of 3. He reports doing well, has had some changes in BP med per Dr. Einar Gip: now on Spironolactone and metoprolol. BP numbers at home have been much better. Trying to lose weight, goal of below 200. Overall, he has been on CPAP for the past several months now and feels that he has benefited from it, seems to sleep better with better sleep quality and less daytime tiredness. He is motivated to continue with treatment. The patient's allergies, current medications, family history, past medical history, past social history, past surgical history and problem list  were reviewed and updated as appropriate.   Previously (copied from previous notes for reference):   I first met him on 08/24/2015 at the request of his primary care physician, at which time he reported snoring and excessive daytime somnolence as well as memory problems. I invited him for a sleep study. He had a baseline sleep study, followed by a CPAP titration study. I went over his test results with him in detail today. Baseline sleep study from 09/02/2015 showed a sleep efficiency of 73% with a prolonged sleep latency of 37 minutes and wake after sleep onset of 92 minutes with moderate sleep fragmentation noted. He had an increased percentage of stage I sleep, and REM sleep was 24.8% with a normal REM latency. Total AHI was 22.3 per hour, rising to 55.5 per hour during REM sleep. Average oxygen saturation was 91%, nadir was 79%. He had mild PLMS with an index of 21.1 per hour, without significant arousals. Based on his test results, his medical history and in particular in light of his stroke as well as his sleep related complaints, I invited him for a full night CPAP titration study. He had this on 09/16/2015. Sleep efficiency was 60.3%, sleep latency was 72.5 minutes, wake after sleep onset was 112.5 minutes with several longer periods of wakefulness. Arousal index was normal. He had an increased percentage of slow-wave sleep and an increased percentage of REM sleep with a normal REM latency. PLMS were mild no significant arousals. Average oxygen saturation was 94%, nadir was 91%. CPAP was titrated from  5 cm to 7 cm. AHI was 0 per hour at the final pressure with nonsupine REM sleep achieved. Based on his test results I prescribed CPAP therapy for home use.   I reviewed his CPAP compliance data from 10/17/2015 through 11/15/2015 which is a total of 30 days, during which time he used his machine every night with percent used days greater than 4 hours at 100%, indicating superb compliance with an average  usage of 8 hours and 12 minutes, residual AHI 7.1 per hour, suboptimal, leaked low with the 95th percentile at 1.8 L/m on a pressure of 7 cm with EPR of 3.   08/24/2015: He reports snoring and some daytime somnolence, memory complaints for the past year. I reviewed your office note from 08/10/2015, which you kindly included.   His Epworth sleepiness score is 3 out of 24 today, his fatigue score is 15 out of 63. He does take a nap almost on a daily basis, usually in the late afternoon. In the mornings he feels fairly adequately rested when he first wakes up. He tries to be in bed by 9:30 and usually wakes up between 5 and 6 AM. He has nocturia usually once or twice per average night. He denies morning headaches. He has occasional restless leg symptoms in the right leg only, started after his stroke. He has a feeling of fidgetiness in the right leg and moving it helps. He denies any actual pain or numbness on the right side. He has no actual major residual symptoms on the right side with the exception of difficulty with fine motor skills and not always picking up his right foot while walking. He tries to be active. He walks about 2 miles per day. He drinks 1 glass of wine per day,   He quit smoking about 30 years ago, drinks caffeine in the form of coffee 2-4 cups per day, also some soda during the day but noncaffeine. He has 2 children from his first marriage and 2 children from his current wife. He is retired. He has noted short-term memory issues for the past year or so, really since his stroke. He has some forgetfulness, denies any problems driving.   He has seen cardiologist, Dr. Einar Gip, and workup has been benign per their verbal report. He had a Holter monitor which was benign as well, due to his history of syncope and collapse. He is due to have a 24-hour blood pressure monitor placed. Implantable loop monitor was previously discussed as well, as I understand.    His Past Medical History Is Significant  For: Past Medical History:  Diagnosis Date  . Accelerated hypertension 06/29/2014  . Arthritis    Back  . Chronic kidney disease   . Dizziness and giddiness   . Enlarged prostate   . Esophagitis 05/10/2011  . Foley catheter in place   . FOOT SPRAIN, RIGHT 07/18/2007   Qualifier: Diagnosis of  By: Aline Brochure MD, [name]    . GERD (gastroesophageal reflux disease)   . HOH (hard of hearing)   . Hx-TIA (transient ischemic attack)    hx of this- wife states 10 years ago-but Dr. Estil Daft told wife he had had several-has 2 stents in carotid artery-left  . Hypercholesteremia   . Hypertension   . Low back pain   . Nocturia   . Stroke [facility])    had a stroke 05/23/2014  . Syncope and collapse     His Past Surgical History Is Significant For: Past Surgical History:  Procedure  Laterality Date  . carotid stents  05/2014   2 stents in carotid artery on left side  . COLONOSCOPY    . COLONOSCOPY  05/17/2011   Procedure: COLONOSCOPY;  Surgeon: [name], MD;  [facility];  Service: Endoscopy;  Laterality: N/A;  830  . epidural steroid injection  05/22/2014   had this in lumbar and at 1 am on 05/23/2014 had a stroke  . ESOPHAGOGASTRODUODENOSCOPY  10/28/2010   Procedure: ESOPHAGOGASTRODUODENOSCOPY (EGD);  Surgeon: [name], MD;  [facility];  Service: Endoscopy;  Laterality: N/A;  10:30  . ESOPHAGOGASTRODUODENOSCOPY N/A 04/24/2014   Procedure: ESOPHAGOGASTRODUODENOSCOPY (EGD);  Surgeon: [name], MD;  [facility];  Service: Endoscopy;  Laterality: N/A;  815  . ESOPHAGOGASTRODUODENOSCOPY N/A 06/26/2014   Procedure: ESOPHAGOGASTRODUODENOSCOPY (EGD);  Surgeon: [name], MD;  [facility];  Service: Endoscopy;  Laterality: N/A;  . EUS N/A 06/26/2014   Procedure: FULL UPPER ENDOSCOPIC ULTRASOUND (EUS) RADIAL;  Surgeon: [name], MD;  Location: [facility];  Service: Endoscopy;  Laterality: N/A;  . IR GENERIC HISTORICAL  10/12/2015   IR  ANGIO VERTEBRAL SEL VERTEBRAL BILAT MOD SED 10/12/2015 Luanne Bras, MD [facility]  . IR GENERIC HISTORICAL  10/12/2015   IR ANGIO INTRA EXTRACRAN SEL COM CAROTID INNOMINATE BILAT MOD SED 10/12/2015 Luanne Bras, MD [facility]  . RADIOLOGY WITH ANESTHESIA N/A 05/23/2014   Procedure: RADIOLOGY WITH ANESTHESIA;  Surgeon: Medication Radiologist, MD;  Location: [facility];  Service: Radiology;  Laterality: N/A;  . RESECTION DISTAL CLAVICAL Right 12/13/2012   Procedure: RESECTION DISTAL CLAVICAL;  Surgeon: Carole Civil, MD;  Location: [facility];  Service: Orthopedics;  Laterality: Right;  . SHOULDER ARTHROSCOPY WITH BICEPSTENOTOMY Right 12/13/2012   Procedure: SHOULDER ARTHROSCOPY WITH BICEPS TENOTOMY, limited shoulder debridement;  Surgeon: Carole Civil, MD;  Location: [facility];  Service: Orthopedics;  Laterality: Right;  . SHOULDER SURGERY Right    torn cartiledge  . SHOULDER SURGERY     right shoulder  . TRANSURETHRAL RESECTION OF PROSTATE N/A 07/09/2014   Procedure: TRANSURETHRAL RESECTION of prostate WITH BUTTON ;  Surgeon: [name], MD;  Location: [facility];  Service: Urology;  Laterality: N/A;    His Family History Is Significant For: Family History  Problem Relation Age of Onset  . Thyroid disease Mother   . Colon cancer Neg Hx     His Social History Is Significant For: Social History   Social History  . Marital status: Married    Spouse name: N/A  . Number of children: 4  . Years of education: 39   Occupational History  . retired     [employer]   Social History Main Topics  . Smoking status: Former Smoker    Packs/day: 0.50    Years: 35.00    Types: Cigarettes    Quit date: 12/10/1994  . Smokeless tobacco: Never Used  . Alcohol use 1.8 oz/week    3 Glasses of wine per week  . Drug use: No  . Sexual activity: Yes    Birth control/ protection: None   Other Topics Concern  . None   Social History Narrative   Married, retired - [employer]   Right handed    Caffeine use - 3-4 cups coffee daily    His Allergies Are:  No Known Allergies:   His Current Medications Are:  Outpatient Encounter Prescriptions as of 07/24/2016  Medication Sig  . amLODipine (NORVASC) 10 MG tablet Take 10 mg  by mouth daily.   Marland Kitchen aspirin 81 MG tablet Take 81 mg by mouth daily.  Marland Kitchen atorvastatin (LIPITOR) 40 MG tablet Take 1 tablet (40 mg total) by mouth every evening.  . Cholecalciferol (VITAMIN D PO) Take 1 capsule by mouth every evening.  . clopidogrel (PLAVIX) 75 MG tablet TAKE 1 TABLET DAILY  . finasteride (PROSCAR) 5 MG tablet Take 1 tablet (5 mg total) by mouth daily.  . Fish Oil-Cholecalciferol (FISH OIL + D3) 1000-1000 MG-UNIT CAPS Take 1 capsule by mouth daily.  Marland Kitchen lisinopril-hydrochlorothiazide (PRINZIDE,ZESTORETIC) 20-12.5 MG tablet Take 1 tablet by mouth daily.   . metoprolol tartrate (LOPRESSOR) 25 MG tablet Take 25 mg by mouth 2 (two) times daily.  Marland Kitchen omeprazole (PRILOSEC) 40 MG capsule Take 40 mg by mouth daily.   Marland Kitchen spironolactone (ALDACTONE) 25 MG tablet Take 25 mg by mouth daily.  . [DISCONTINUED] ALPRAZolam (XANAX) 0.5 MG tablet Take 1 tablet (0.5 mg total) by mouth 3 (three) times daily as needed for anxiety.   No facility-administered encounter medications on file as of 07/24/2016.   :  Review of Systems:  Out of a complete 14 point review of systems, all are reviewed and negative with the exception of these symptoms as listed below:  Review of Systems  Neurological:       Pt presents today to follow up on cpap. Pt denies any concerns.. Pt is getting [facility] supplies from [facility].    Objective:  Neurologic Exam  Physical Exam Physical Examination:   Vitals:   07/24/16 0940  BP: 140/77  Pulse: (!) 54   General Examination: The patient is a very pleasant 82 y.o. male in no acute distress. He appears well-developed and well-nourished and well groomed.   HEENT: Normocephalic, atraumatic, pupils are equal, round and reactive to light and  accommodation. Extraocular tracking is good without limitation to gaze excursion or nystagmus noted. Normal smooth pursuit is noted. Hearing is impaired and he has bilateral hearing aids in place (he reports he needs to get them checked). Face is slightly asymmetric with possible effacement of right nasolabial fold, stable. Speech is clear with no dysarthria noted. There is no hypophonia. There is no lip, neck/head, jaw or voice tremor. Neck is supple with full range of passive and active motion. There are no carotid bruits on auscultation. Oropharynx exam reveals: mild mouth dryness, adequate dental hygiene and moderate airway crowding. Mallampati is class II. Tongue protrudes centrally and palate elevates symmetrically.   Chest: Clear to auscultation without wheezing, rhonchi or crackles noted.  Heart: S1+S2+0, regular and normal without murmurs, rubs or gallops noted.   Abdomen: Soft, non-tender and non-distended with normal bowel sounds appreciated on auscultation.  Extremities: There is trace edema in the distal lower extremities bilaterally.   Skin: Warm and dry without trophic changes noted. There are no varicose veins.  Musculoskeletal: exam reveals no obvious joint deformities, tenderness or joint swelling or erythema.   Neurologically:  Mental status: The patient is awake, alert and oriented in all 4 spheres. His immediate and remote memory, attention, language skills and fund of knowledge are appropriate. There is no evidence of aphasia, agnosia, apraxia or anomia. Speech is clear with normal prosody and enunciation. Thought process is linear. Mood is normal and affect is normal.  Cranial nerves II - XII are as described above under HEENT exam. In addition: shoulder shrug is normal with equal shoulder height noted. Motor exam: Normal bulk, strength and tone is noted. There is no drift, tremor or  rebound. Romberg is negative. Reflexes are 1+ throughout, absent in both ankles, right  side a little more prominent than left as far as reflexes in his arm and knee. Fine motor skills and coordination: mild difficulty with the right upper and lower extremity, normal on the left.  Cerebellar testing: No dysmetria or intention tremor on finger to nose testing. Sensory exam: Intact in the upper extremities, unchanged in the lower extremities.   Gait, station and balance: He stands easily. No veering to one side is noted. No leaning to one side is noted. Posture is age-appropriate and stance is narrow based. Gait shows normal stride length and normal pace. No problems turning are noted.   Assessment and Plan:  In summary, Henry [name] is a very pleasant 82 year old male with an underlying medical history of bilateral carotid artery stenosis, left MCA stroke in April 2016 with status post endovascular re-vascularization through stent placement (has seen one of our stroke specialists for this too, Dr. Erlinda Hong), history of syncope, orthostatic hypotension, hypertension, hyperlipidemia, vitamin D deficiency, and obesity, who presents for follow-up consultation of his obstructive sleep apnea, on CPAP therapy. He had a baseline sleep study in July 2017, followed by a CPAP titration study in August 2017. He has done well after the increase in CPAP pressure from 7-8 cm last time in October 2017 when his AHI was slightly suboptimal. He is at goal with his AHI at this time, leak is low, he is fully compliant with treatment and commended for this. His exam is stable, blood pressure numbers have improved after he was started on 2 additional medications and he is commended for working on his weight. I suggested a one-year checkup for my end of things, I answered all their questions today and the patient and his wife were in agreement. I spent 20 minutes in total face-to-face time with the patient, more than 50% of which was spent in counseling and coordination of care, reviewing test results, reviewing  medication and discussing or reviewing the diagnosis of OSA, its prognosis and treatment options. Pertinent laboratory and imaging test results that were available during this visit with the patient were reviewed by me and considered in my medical decision making (see chart for details).

## 2016-07-24 NOTE — Patient Instructions (Signed)

## 2016-08-07 DIAGNOSIS — M9902 Segmental and somatic dysfunction of thoracic region: Secondary | ICD-10-CM | POA: Diagnosis not present

## 2016-08-07 DIAGNOSIS — M9905 Segmental and somatic dysfunction of pelvic region: Secondary | ICD-10-CM | POA: Diagnosis not present

## 2016-08-07 DIAGNOSIS — M545 Low back pain: Secondary | ICD-10-CM | POA: Diagnosis not present

## 2016-08-07 DIAGNOSIS — M546 Pain in thoracic spine: Secondary | ICD-10-CM | POA: Diagnosis not present

## 2016-08-07 DIAGNOSIS — M9903 Segmental and somatic dysfunction of lumbar region: Secondary | ICD-10-CM | POA: Diagnosis not present

## 2016-08-11 DIAGNOSIS — N401 Enlarged prostate with lower urinary tract symptoms: Secondary | ICD-10-CM | POA: Diagnosis not present

## 2016-08-11 DIAGNOSIS — R351 Nocturia: Secondary | ICD-10-CM | POA: Diagnosis not present

## 2016-08-11 DIAGNOSIS — R310 Gross hematuria: Secondary | ICD-10-CM | POA: Diagnosis not present

## 2016-08-15 DIAGNOSIS — E871 Hypo-osmolality and hyponatremia: Secondary | ICD-10-CM | POA: Diagnosis not present

## 2016-08-16 IMAGING — CT CT ABD-PELV W/ CM
2 of 5 series · 15 of 46 positions shown, 17 images · IV contrast (omnipaque)
Comparison: No priors.

CLINICAL DATA: 79-year-old male with recent inability to void.
Gross hematuria with multiple large clots. Hematuria clinically
suspected to be related to anticoagulation.

EXAM:
CT ABDOMEN AND PELVIS WITH CONTRAST
TECHNIQUE: Multidetector CT imaging of the abdomen and pelvis was performed
using the standard protocol following bolus administration of
intravenous contrast.
CONTRAST:  100mL OMNIPAQUE IOHEXOL 300 MG/ML  SOLN

[Series 2: abd/ pelvis 5.0 i30f 1 · axial · 0.95mm/px · z∈[+812,+1242]mm · 12 of 96 slices shown, 14 images]
[im 5/96  soft-tissue]
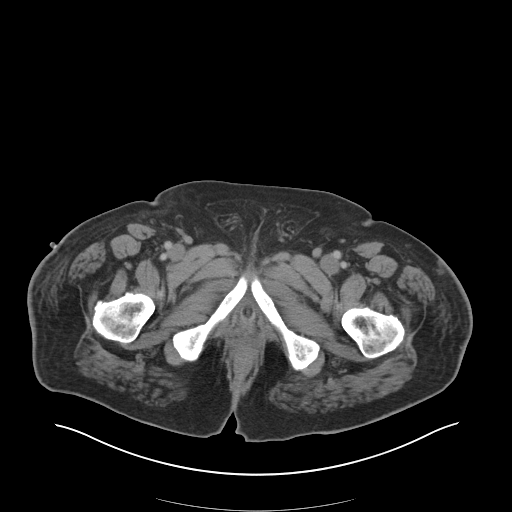
[im 5/96  bone]
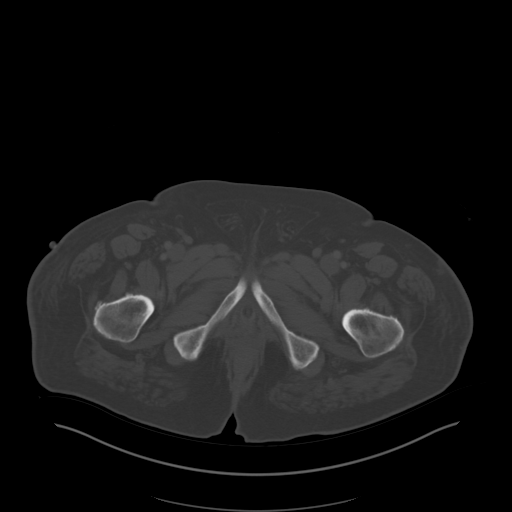
[im 14/96  soft-tissue]
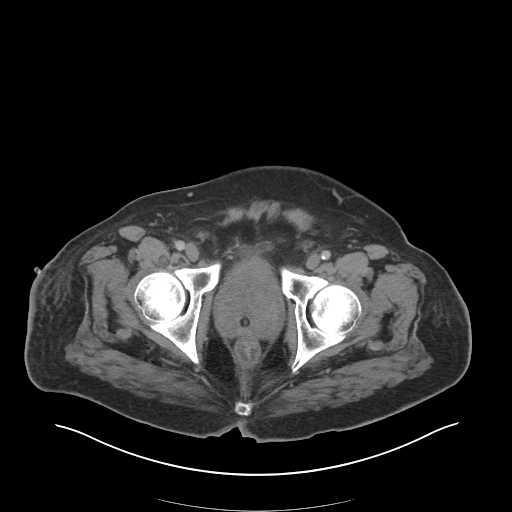
[im 23/96  soft-tissue]
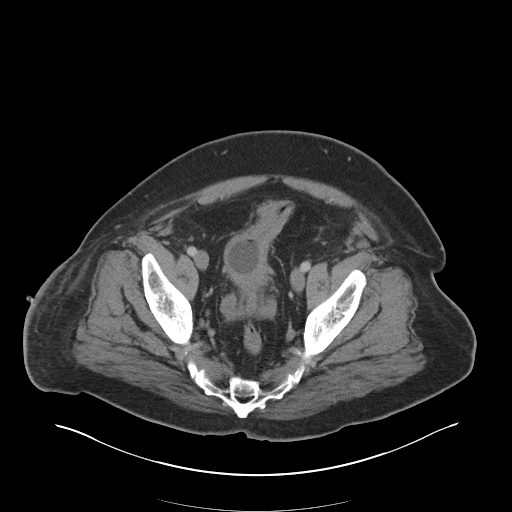
[im 28/96  soft-tissue]
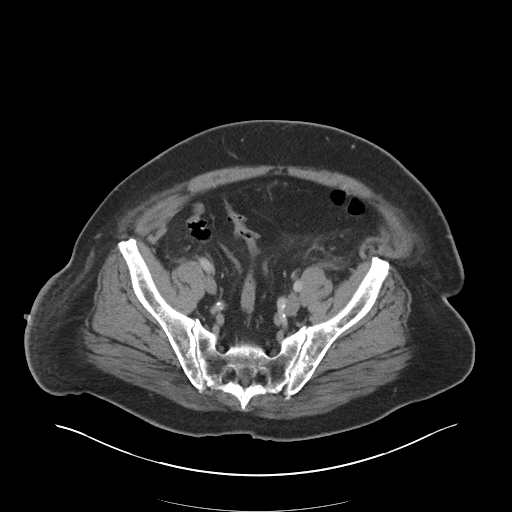
[im 37/96  soft-tissue]
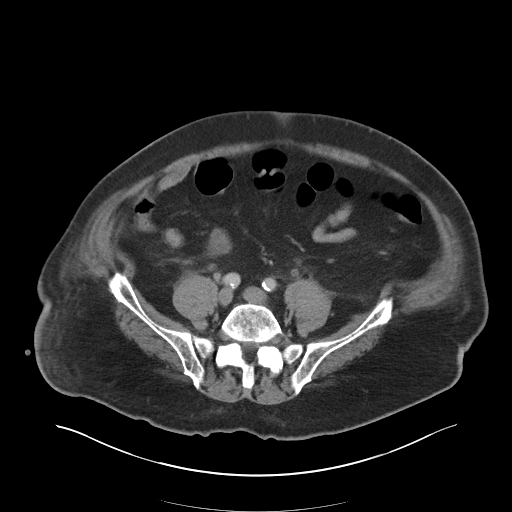
[im 46/96  soft-tissue]
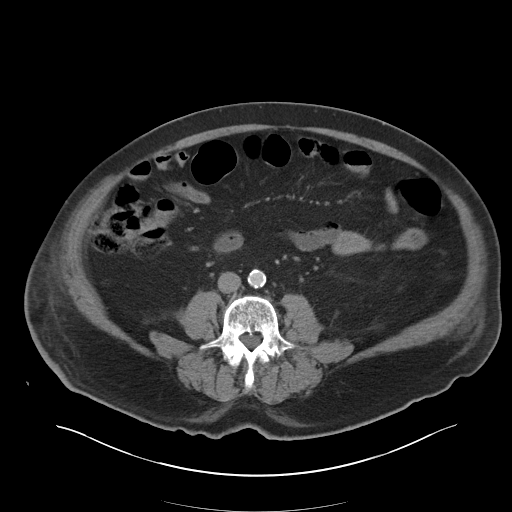
[im 50/96  soft-tissue]
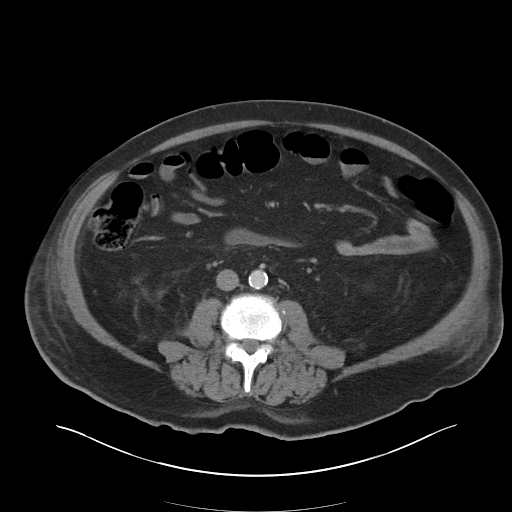
[im 59/96  soft-tissue]
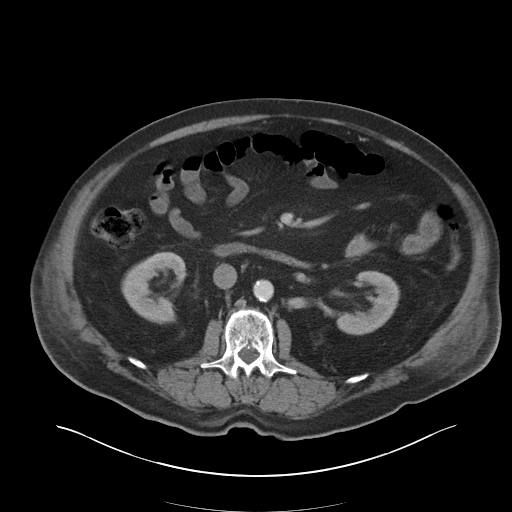
[im 68/96  soft-tissue]
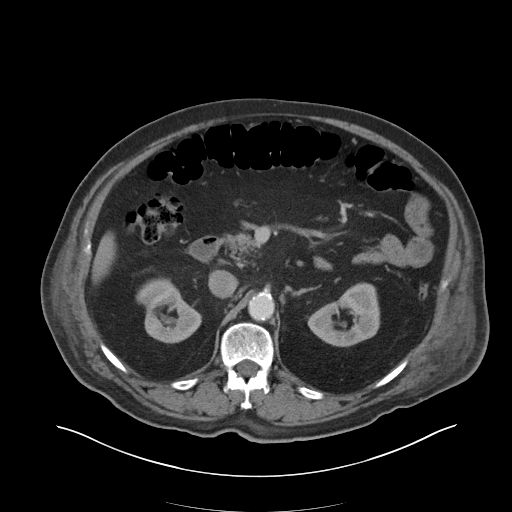
[im 68/96  bone]
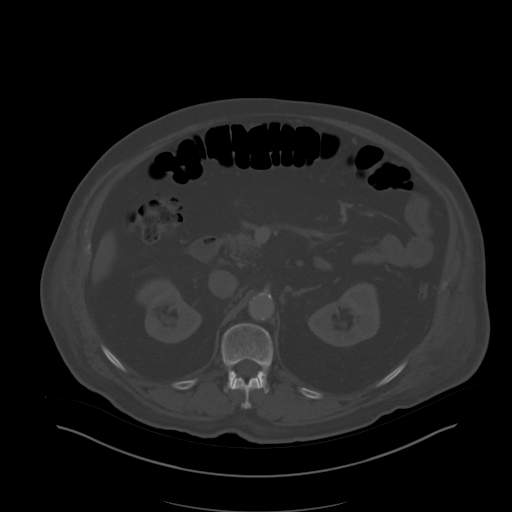
[im 73/96  soft-tissue]
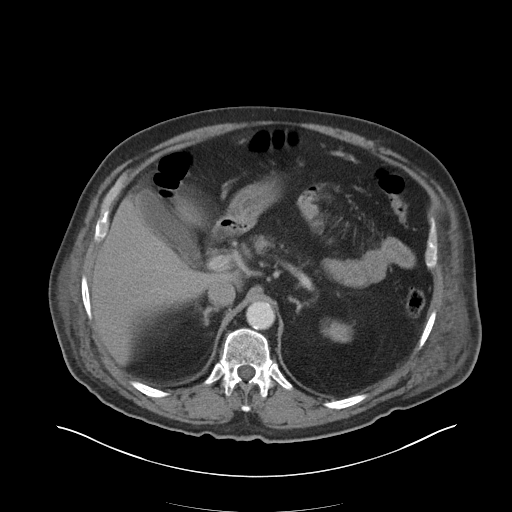
[im 82/96  soft-tissue]
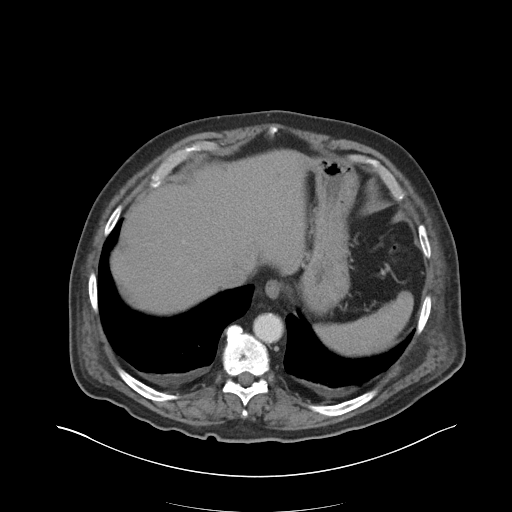
[im 91/96  soft-tissue]
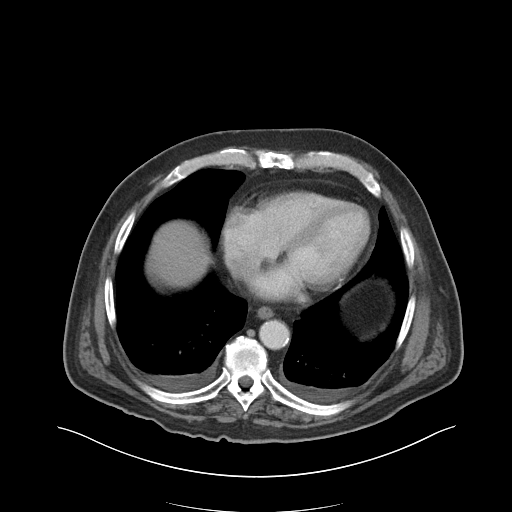

[Series 5: coronals · coronal · 0.95mm/px · 3 of 178 slices shown]
[im 60/178  soft-tissue]
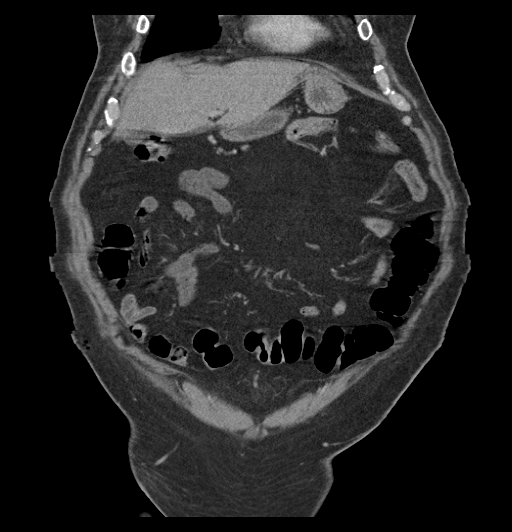
[im 79/178  soft-tissue]
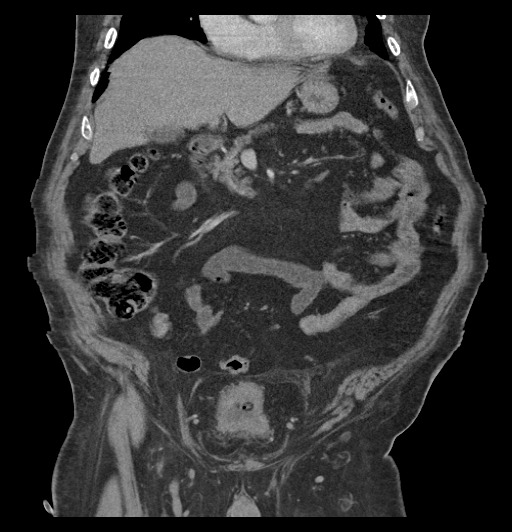
[im 99/178  soft-tissue]
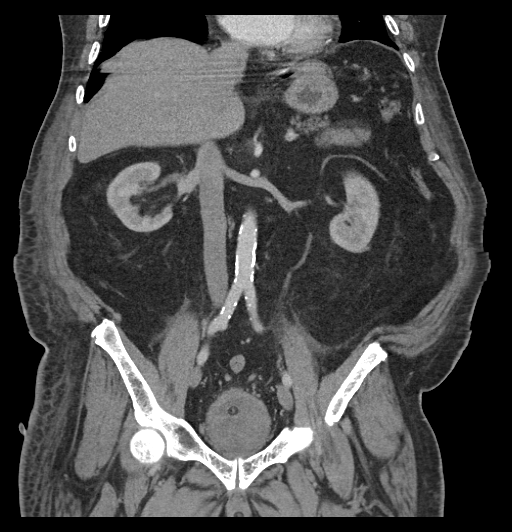

[15 of 46 positions shown; findings below may reference images not displayed]

FINDINGS: Lower chest: Small bilateral pleural effusions layering dependently.

Hepatobiliary: 7 mm low attenuation lesion in segment 2 of the liver
is too small to characterize. No other larger more suspicious
appearing cystic or solid hepatic lesions are noted. Tiny 5 mm high
attenuation focus lying dependently in the neck of the gallbladder
likely represents a tiny gallstone. No current findings to suggest
an acute cholecystitis at this time. No intra or extrahepatic
biliary ductal dilatation.

Pancreas: In the tail of the pancreas there is a poorly defined
nodular appearing area measuring 2.4 x 2.9 cm (image 21 of series
2), concerning for potential pancreatic neoplasm. The head, uncinate
process and body of the pancreas are otherwise normal in appearance.
No pancreatic ductal dilatation. No peripancreatic inflammatory
changes or fluid collections.

Spleen: Unremarkable.

Adrenals/Urinary Tract: Sub cm low-attenuation lesions in the
kidneys bilaterally are too small to definitively characterize, but
are statistically favored to represent small cysts. In addition,
there are 2 simple cysts in the interpolar region and upper pole of
the left kidney, measuring up to 1.9 cm. No suspicious renal lesions
are noted. Focal cortical thinning in the upper pole of the right
kidney likely represents post infectious/inflammatory scarring. No
definite calculi are identified within the collecting system of
either kidney or along the course of either ureter. No
hydroureteronephrosis. Urinary bladder is completely decompressed
around an indwelling Foley catheter. Bilateral adrenal glands are
normal in appearance.

Stomach/Bowel: Normal appearance of the stomach. No pathologic
dilatation of small bowel or colon. Normal appendix.

Vascular/Lymphatic: Atherosclerotic disease throughout the abdominal
and pelvic vasculature, without evidence of aneurysm or dissection.
Circumaortic left renal vein (normal anatomical variant)
incidentally noted. No lymphadenopathy noted in the abdomen or
pelvis.

Reproductive: Prostate gland appears enlarged and heterogeneous in
appearance, measuring up to 7.1 x 7.3 cm. Seminal vesicles are
unremarkable in appearance.

Other: No significant volume of ascites.  No pneumoperitoneum.

Musculoskeletal: Poorly defined area of sclerosis and lucency in the
sacrum at the level of S1, which has a benign appearance, favored to
reflect fibrous dysplasia. No other aggressive appearing lytic or
blastic lesions are noted within the visualized portions of the
skeleton.
IMPRESSION: 1. No definite source for hematuria identified on today's
examination. There are multiple low-attenuation lesions in the
kidneys bilaterally, the largest of which are compatible with simple
cysts. The smaller lesions are too small to definitively
characterize by CT, but are also favored to represent small cysts.
Definitive characterization could be performed with MRI of the
abdomen with and without IV gadolinium if of clinical concern.
2. Ill-defined nodular area in the tail of the pancreas measuring
2.4 x 2.9 cm (image 21 of series 2), concerning for small pancreatic
neoplasm. Further characterization with MRI of the abdomen with and
without IV gadolinium with MRCP is recommended in the near future.
3. Heterogeneous appearing enlarged prostate gland, suggestive of
BPH.
4. Small bilateral pleural effusions layering dependently.
5. Sub cm low-attenuation lesion in segment 2 of the liver is too
small to characterize. Attention at time of follow-up MRI
examination is recommended.
6. Cholelithiasis without evidence of acute cholecystitis at this
time.
7. Additional incidental findings, as above.
These results will be called to the ordering clinician or
representative by the Radiologist Assistant, and communication
documented in the PACS or zVision Dashboard.

## 2016-09-06 DIAGNOSIS — M546 Pain in thoracic spine: Secondary | ICD-10-CM | POA: Diagnosis not present

## 2016-09-06 DIAGNOSIS — M9903 Segmental and somatic dysfunction of lumbar region: Secondary | ICD-10-CM | POA: Diagnosis not present

## 2016-09-06 DIAGNOSIS — M545 Low back pain: Secondary | ICD-10-CM | POA: Diagnosis not present

## 2016-09-06 DIAGNOSIS — M9902 Segmental and somatic dysfunction of thoracic region: Secondary | ICD-10-CM | POA: Diagnosis not present

## 2016-09-06 DIAGNOSIS — M9905 Segmental and somatic dysfunction of pelvic region: Secondary | ICD-10-CM | POA: Diagnosis not present

## 2016-09-08 DIAGNOSIS — I10 Essential (primary) hypertension: Secondary | ICD-10-CM | POA: Diagnosis not present

## 2016-09-21 DIAGNOSIS — I951 Orthostatic hypotension: Secondary | ICD-10-CM | POA: Diagnosis not present

## 2016-09-21 DIAGNOSIS — I10 Essential (primary) hypertension: Secondary | ICD-10-CM | POA: Diagnosis not present

## 2016-09-21 DIAGNOSIS — R600 Localized edema: Secondary | ICD-10-CM | POA: Diagnosis not present

## 2016-10-06 DIAGNOSIS — I10 Essential (primary) hypertension: Secondary | ICD-10-CM | POA: Diagnosis not present

## 2016-10-11 DIAGNOSIS — Z6838 Body mass index (BMI) 38.0-38.9, adult: Secondary | ICD-10-CM | POA: Diagnosis not present

## 2016-10-11 DIAGNOSIS — D171 Benign lipomatous neoplasm of skin and subcutaneous tissue of trunk: Secondary | ICD-10-CM | POA: Diagnosis not present

## 2016-10-19 ENCOUNTER — Other Ambulatory Visit (HOSPITAL_COMMUNITY): Payer: Self-pay | Admitting: Interventional Radiology

## 2016-10-19 DIAGNOSIS — I10 Essential (primary) hypertension: Secondary | ICD-10-CM | POA: Diagnosis not present

## 2016-10-19 DIAGNOSIS — I771 Stricture of artery: Secondary | ICD-10-CM

## 2016-10-19 DIAGNOSIS — R600 Localized edema: Secondary | ICD-10-CM | POA: Diagnosis not present

## 2016-10-19 DIAGNOSIS — R001 Bradycardia, unspecified: Secondary | ICD-10-CM | POA: Diagnosis not present

## 2016-10-19 DIAGNOSIS — I951 Orthostatic hypotension: Secondary | ICD-10-CM | POA: Diagnosis not present

## 2016-10-19 DIAGNOSIS — I639 Cerebral infarction, unspecified: Secondary | ICD-10-CM

## 2016-10-25 ENCOUNTER — Ambulatory Visit (HOSPITAL_COMMUNITY)
Admission: RE | Admit: 2016-10-25 | Discharge: 2016-10-25 | Disposition: A | Discharge status: Home / Self Care | Payer: Medicare Other | Source: Ambulatory Visit | Attending: Interventional Radiology | Admitting: Interventional Radiology

## 2016-10-25 DIAGNOSIS — I639 Cerebral infarction, unspecified: Secondary | ICD-10-CM | POA: Diagnosis not present

## 2016-10-25 DIAGNOSIS — I771 Stricture of artery: Secondary | ICD-10-CM | POA: Insufficient documentation

## 2016-10-25 LAB — VAS US CAROTID
LCCADDIAS: -10 cm/s
LCCADSYS: -51 cm/s
LCCAPSYS: 55 cm/s
LEFT ECA DIAS: -2 cm/s
LEFT VERTEBRAL DIAS: -10 cm/s
LICADSYS: -121 cm/s
Left CCA prox dias: 9 cm/s
Left ICA dist dias: -23 cm/s
Left ICA prox dias: -27 cm/s
Left ICA prox sys: -112 cm/s
RCCAPDIAS: 13 cm/s
RIGHT ECA DIAS: -6 cm/s
RIGHT VERTEBRAL DIAS: -10 cm/s
Right CCA prox sys: 64 cm/s
Right cca dist sys: -64 cm/s

## 2016-10-25 NOTE — Progress Notes (Signed)
**  Preliminary report by tech**  Carotid artery duplex complete. Findings are consistent with a 1-39 percent stenosis involving the right internal carotid artery and the left internal carotid artery. The vertebral arteries demonstrate antegrade flow.  10/25/16 9:26 AM Henry [name] RVT

## 2016-10-30 DIAGNOSIS — M546 Pain in thoracic spine: Secondary | ICD-10-CM | POA: Diagnosis not present

## 2016-10-30 DIAGNOSIS — M545 Low back pain: Secondary | ICD-10-CM | POA: Diagnosis not present

## 2016-10-30 DIAGNOSIS — M9905 Segmental and somatic dysfunction of pelvic region: Secondary | ICD-10-CM | POA: Diagnosis not present

## 2016-10-30 DIAGNOSIS — M9903 Segmental and somatic dysfunction of lumbar region: Secondary | ICD-10-CM | POA: Diagnosis not present

## 2016-10-30 DIAGNOSIS — M9902 Segmental and somatic dysfunction of thoracic region: Secondary | ICD-10-CM | POA: Diagnosis not present

## 2016-11-08 ENCOUNTER — Telehealth (HOSPITAL_COMMUNITY): Payer: Self-pay

## 2016-11-08 NOTE — Telephone Encounter (Signed)
Pt agreed to f/u in 6 months with us carotid. AW 

## 2016-11-30 DIAGNOSIS — Z0000 Encounter for general adult medical examination without abnormal findings: Secondary | ICD-10-CM | POA: Diagnosis not present

## 2016-11-30 DIAGNOSIS — I10 Essential (primary) hypertension: Secondary | ICD-10-CM | POA: Diagnosis not present

## 2016-11-30 DIAGNOSIS — E7849 Other hyperlipidemia: Secondary | ICD-10-CM | POA: Diagnosis not present

## 2016-11-30 DIAGNOSIS — Z125 Encounter for screening for malignant neoplasm of prostate: Secondary | ICD-10-CM | POA: Diagnosis not present

## 2016-12-07 DIAGNOSIS — Z0000 Encounter for general adult medical examination without abnormal findings: Secondary | ICD-10-CM | POA: Diagnosis not present

## 2016-12-07 DIAGNOSIS — R062 Wheezing: Secondary | ICD-10-CM | POA: Diagnosis not present

## 2016-12-07 DIAGNOSIS — D171 Benign lipomatous neoplasm of skin and subcutaneous tissue of trunk: Secondary | ICD-10-CM | POA: Diagnosis not present

## 2016-12-07 DIAGNOSIS — K5909 Other constipation: Secondary | ICD-10-CM | POA: Diagnosis not present

## 2016-12-07 DIAGNOSIS — I10 Essential (primary) hypertension: Secondary | ICD-10-CM | POA: Diagnosis not present

## 2016-12-07 DIAGNOSIS — Z8679 Personal history of other diseases of the circulatory system: Secondary | ICD-10-CM | POA: Diagnosis not present

## 2016-12-07 DIAGNOSIS — Z6838 Body mass index (BMI) 38.0-38.9, adult: Secondary | ICD-10-CM | POA: Diagnosis not present

## 2016-12-07 DIAGNOSIS — G4733 Obstructive sleep apnea (adult) (pediatric): Secondary | ICD-10-CM | POA: Diagnosis not present

## 2016-12-07 DIAGNOSIS — Z1389 Encounter for screening for other disorder: Secondary | ICD-10-CM | POA: Diagnosis not present

## 2016-12-07 DIAGNOSIS — E7849 Other hyperlipidemia: Secondary | ICD-10-CM | POA: Diagnosis not present

## 2016-12-07 DIAGNOSIS — R42 Dizziness and giddiness: Secondary | ICD-10-CM | POA: Diagnosis not present

## 2016-12-07 DIAGNOSIS — Z23 Encounter for immunization: Secondary | ICD-10-CM | POA: Diagnosis not present

## 2017-02-19 DIAGNOSIS — I951 Orthostatic hypotension: Secondary | ICD-10-CM | POA: Diagnosis not present

## 2017-02-19 DIAGNOSIS — G4733 Obstructive sleep apnea (adult) (pediatric): Secondary | ICD-10-CM | POA: Diagnosis not present

## 2017-02-19 DIAGNOSIS — I10 Essential (primary) hypertension: Secondary | ICD-10-CM | POA: Diagnosis not present

## 2017-02-19 DIAGNOSIS — R001 Bradycardia, unspecified: Secondary | ICD-10-CM | POA: Diagnosis not present

## 2017-02-20 ENCOUNTER — Ambulatory Visit (INDEPENDENT_AMBULATORY_CARE_PROVIDER_SITE_OTHER): Payer: Medicare Other | Admitting: Urology

## 2017-02-20 DIAGNOSIS — N401 Enlarged prostate with lower urinary tract symptoms: Secondary | ICD-10-CM

## 2017-02-20 DIAGNOSIS — R351 Nocturia: Secondary | ICD-10-CM | POA: Diagnosis not present

## 2017-02-20 DIAGNOSIS — R310 Gross hematuria: Secondary | ICD-10-CM | POA: Diagnosis not present

## 2017-03-02 ENCOUNTER — Other Ambulatory Visit (HOSPITAL_COMMUNITY)
Admission: AD | Admit: 2017-03-02 | Discharge: 2017-03-02 | Disposition: A | Discharge status: Home / Self Care | Payer: Medicare Other | Source: Other Acute Inpatient Hospital | Attending: Urology | Admitting: Urology

## 2017-03-02 ENCOUNTER — Ambulatory Visit (INDEPENDENT_AMBULATORY_CARE_PROVIDER_SITE_OTHER): Payer: Medicare Other | Admitting: Urology

## 2017-03-02 DIAGNOSIS — R310 Gross hematuria: Secondary | ICD-10-CM

## 2017-03-02 DIAGNOSIS — N401 Enlarged prostate with lower urinary tract symptoms: Secondary | ICD-10-CM | POA: Diagnosis not present

## 2017-03-02 DIAGNOSIS — R350 Frequency of micturition: Secondary | ICD-10-CM | POA: Diagnosis not present

## 2017-03-02 LAB — URINALYSIS, COMPLETE (UACMP) WITH MICROSCOPIC
Bilirubin Urine: NEGATIVE
Glucose, UA: NEGATIVE mg/dL
Ketones, ur: NEGATIVE mg/dL
Nitrite: POSITIVE — AB
PROTEIN: NEGATIVE mg/dL
SQUAMOUS EPITHELIAL / LPF: NONE SEEN
Specific Gravity, Urine: 1.009 (ref 1.005–1.030)
pH: 8 (ref 5.0–8.0)

## 2017-03-05 LAB — URINE CULTURE: Culture: >=100000 — AB

## 2017-04-09 DIAGNOSIS — M9903 Segmental and somatic dysfunction of lumbar region: Secondary | ICD-10-CM | POA: Diagnosis not present

## 2017-04-09 DIAGNOSIS — M9905 Segmental and somatic dysfunction of pelvic region: Secondary | ICD-10-CM | POA: Diagnosis not present

## 2017-04-09 DIAGNOSIS — M546 Pain in thoracic spine: Secondary | ICD-10-CM | POA: Diagnosis not present

## 2017-04-09 DIAGNOSIS — M9902 Segmental and somatic dysfunction of thoracic region: Secondary | ICD-10-CM | POA: Diagnosis not present

## 2017-04-09 DIAGNOSIS — M545 Low back pain: Secondary | ICD-10-CM | POA: Diagnosis not present

## 2017-04-17 ENCOUNTER — Ambulatory Visit (INDEPENDENT_AMBULATORY_CARE_PROVIDER_SITE_OTHER): Payer: Medicare Other | Admitting: Urology

## 2017-04-17 ENCOUNTER — Other Ambulatory Visit (HOSPITAL_COMMUNITY)
Admission: RE | Admit: 2017-04-17 | Discharge: 2017-04-17 | Disposition: A | Discharge status: Home / Self Care | Payer: Medicare Other | Source: Ambulatory Visit | Attending: Urology | Admitting: Urology

## 2017-04-17 DIAGNOSIS — R310 Gross hematuria: Secondary | ICD-10-CM | POA: Insufficient documentation

## 2017-04-17 DIAGNOSIS — N401 Enlarged prostate with lower urinary tract symptoms: Secondary | ICD-10-CM | POA: Diagnosis not present

## 2017-04-17 DIAGNOSIS — R351 Nocturia: Secondary | ICD-10-CM | POA: Diagnosis not present

## 2017-04-18 ENCOUNTER — Telehealth: Payer: Self-pay | Admitting: Neurology

## 2017-04-18 DIAGNOSIS — G4733 Obstructive sleep apnea (adult) (pediatric): Secondary | ICD-10-CM

## 2017-04-18 DIAGNOSIS — Z9989 Dependence on other enabling machines and devices: Principal | ICD-10-CM

## 2017-04-18 NOTE — Telephone Encounter (Signed)
Pts wife called stating that [facility] is requesting a new referral for the CPAP supplies be sent over contact [facility] at [place]

## 2017-04-18 NOTE — Telephone Encounter (Signed)
Dr. Rexene Alberts is out of the office, Dr. Brett Fairy is agreeable to ordering further cpap supplies for this pt. Order placed, will send to [facility].

## 2017-04-20 LAB — URINE CULTURE: Culture: >=100000 — AB

## 2017-04-24 ENCOUNTER — Ambulatory Visit (INDEPENDENT_AMBULATORY_CARE_PROVIDER_SITE_OTHER): Payer: Medicare Other | Admitting: Urology

## 2017-04-24 DIAGNOSIS — R310 Gross hematuria: Secondary | ICD-10-CM | POA: Diagnosis not present

## 2017-04-24 DIAGNOSIS — N401 Enlarged prostate with lower urinary tract symptoms: Secondary | ICD-10-CM

## 2017-05-16 DIAGNOSIS — N401 Enlarged prostate with lower urinary tract symptoms: Secondary | ICD-10-CM | POA: Diagnosis not present

## 2017-05-16 DIAGNOSIS — R310 Gross hematuria: Secondary | ICD-10-CM | POA: Diagnosis not present

## 2017-05-16 DIAGNOSIS — R350 Frequency of micturition: Secondary | ICD-10-CM | POA: Diagnosis not present

## 2017-05-24 DIAGNOSIS — L820 Inflamed seborrheic keratosis: Secondary | ICD-10-CM | POA: Diagnosis not present

## 2017-06-18 DIAGNOSIS — M545 Low back pain: Secondary | ICD-10-CM | POA: Diagnosis not present

## 2017-06-18 DIAGNOSIS — M9903 Segmental and somatic dysfunction of lumbar region: Secondary | ICD-10-CM | POA: Diagnosis not present

## 2017-06-18 DIAGNOSIS — M9902 Segmental and somatic dysfunction of thoracic region: Secondary | ICD-10-CM | POA: Diagnosis not present

## 2017-06-18 DIAGNOSIS — M546 Pain in thoracic spine: Secondary | ICD-10-CM | POA: Diagnosis not present

## 2017-06-18 DIAGNOSIS — M9905 Segmental and somatic dysfunction of pelvic region: Secondary | ICD-10-CM | POA: Diagnosis not present

## 2017-07-04 DIAGNOSIS — M9902 Segmental and somatic dysfunction of thoracic region: Secondary | ICD-10-CM | POA: Diagnosis not present

## 2017-07-04 DIAGNOSIS — M545 Low back pain: Secondary | ICD-10-CM | POA: Diagnosis not present

## 2017-07-04 DIAGNOSIS — M546 Pain in thoracic spine: Secondary | ICD-10-CM | POA: Diagnosis not present

## 2017-07-04 DIAGNOSIS — M9903 Segmental and somatic dysfunction of lumbar region: Secondary | ICD-10-CM | POA: Diagnosis not present

## 2017-07-04 DIAGNOSIS — M9905 Segmental and somatic dysfunction of pelvic region: Secondary | ICD-10-CM | POA: Diagnosis not present

## 2017-07-23 ENCOUNTER — Encounter: Payer: Self-pay | Admitting: Neurology

## 2017-07-25 ENCOUNTER — Ambulatory Visit (INDEPENDENT_AMBULATORY_CARE_PROVIDER_SITE_OTHER): Payer: Medicare Other | Admitting: Neurology

## 2017-07-25 ENCOUNTER — Encounter: Payer: Self-pay | Admitting: Neurology

## 2017-07-25 VITALS — BP 164/72 | HR 56 | Ht 68.0 in | Wt 244.0 lb

## 2017-07-25 DIAGNOSIS — Z9989 Dependence on other enabling machines and devices: Secondary | ICD-10-CM

## 2017-07-25 DIAGNOSIS — G4733 Obstructive sleep apnea (adult) (pediatric): Secondary | ICD-10-CM

## 2017-07-25 DIAGNOSIS — R635 Abnormal weight gain: Secondary | ICD-10-CM

## 2017-07-25 NOTE — Progress Notes (Signed)
Subjective:    Patient ID: Henry [name] is a 83 y.o. male.  HPI     Interim history:    Henry [name] is a 83 year old right-handed gentleman with an underlying medical history of bilateral carotid artery stenosis, left MCA stroke in April 2016 with status post endovascular re-vascularization (s/p stent placement), history of syncope, orthostatic hypotension, hypertension, hyperlipidemia, vitamin D deficiency, and obesity, who presents for follow-up consultation of his obstructive sleep apnea, established on treatment with CPAP. The patient is accompanied by his wife again today. I last saw him on 07/24/2016, at which time he was fully compliant with CPAP. He reported doing well. He was trying to lose weight. He endorsed benefit from using CPAP including better sleep quality and less daytime tiredness. He was motivated to continue with treatment.  Today, 07/25/2017: I reviewed his CPAP compliance data from 01/2018 through 07/23/2017, which is a total of 30 days, during which time he used his machine every night with percent used days greater than 4 hours at 100%, indicating superb compliance with an average usage of 8 hours and 19 minutes, residual AHI borderline at 5 per hour, leak on the low side with the 95th percentile at 6.8 L/m on a pressure of 8 cm with EPR of 3. He reports that CPAP is going well. He uses nasal pillows. He had some medication changes for his blood pressure regimen. He sees Dr. Einar Gip for this. He has gained weight. He tries to walk on a regular basis. He had to give up golfing because of feeling dizzy.he reports that he has noted occasional wheezing. He is wondering if this is from using CPAP. He is very up-to-date with cleaning the supplies and replacing them. He does not report any shortness of breath or recent illness or allergy symptoms but he does have a history of reflux.  The patient's allergies, current medications, family history, past medical history, past social  history, past surgical history and problem list were reviewed and updated as appropriate.    Previously (copied from previous notes for reference):   I saw him on 11/17/2015, at which time we discussed his sleep study results from his baseline sleep study as well as his CPAP titration study. He was compliant with treatment.   I reviewed his CPAP compliance data from 06/20/2016 through 07/19/2016, which is a total of 30 days, during which time he used his CPAP every night with percent used days greater than 4 hours at 100%, indicating superb compliance with an average usage of 8 hours and 6 minutes, residual AHI 4.9 per hour, leak on the low side with the 95th percentile at 7 L/m on a pressure of 8 cm with EPR of 3.    I first met him on 08/24/2015 at the request of his primary care physician, at which time he reported snoring and excessive daytime somnolence as well as memory problems. I invited him for a sleep study. He had a baseline sleep study, followed by a CPAP titration study. I went over his test results with him in detail today. Baseline sleep study from 09/02/2015 showed a sleep efficiency of 73% with a prolonged sleep latency of 37 minutes and wake after sleep onset of 92 minutes with moderate sleep fragmentation noted. He had an increased percentage of stage I sleep, and REM sleep was 24.8% with a normal REM latency. Total AHI was 22.3 per hour, rising to 55.5 per hour during REM sleep. Average oxygen saturation was 91%, nadir was 79%.  He had mild PLMS with an index of 21.1 per hour, without significant arousals. Based on his test results, his medical history and in particular in light of his stroke as well as his sleep related complaints, I invited him for a full night CPAP titration study. He had this on 09/16/2015. Sleep efficiency was 60.3%, sleep latency was 72.5 minutes, wake after sleep onset was 112.5 minutes with several longer periods of wakefulness. Arousal index was normal. He had an  increased percentage of slow-wave sleep and an increased percentage of REM sleep with a normal REM latency. PLMS were mild no significant arousals. Average oxygen saturation was 94%, nadir was 91%. CPAP was titrated from 5 cm to 7 cm. AHI was 0 per hour at the final pressure with nonsupine REM sleep achieved. Based on his test results I prescribed CPAP therapy for home use.   I reviewed his CPAP compliance data from 10/17/2015 through 11/15/2015 which is a total of 30 days, during which time he used his machine every night with percent used days greater than 4 hours at 100%, indicating superb compliance with an average usage of 8 hours and 12 minutes, residual AHI 7.1 per hour, suboptimal, leaked low with the 95th percentile at 1.8 L/m on a pressure of 7 cm with EPR of 3.   08/24/2015: He reports snoring and some daytime somnolence, memory complaints for the past year. I reviewed your office note from 08/10/2015, which you kindly included.   His Epworth sleepiness score is 3 out of 24 today, his fatigue score is 15 out of 63. He does take a nap almost on a daily basis, usually in the late afternoon. In the mornings he feels fairly adequately rested when he first wakes up. He tries to be in bed by 9:30 and usually wakes up between 5 and 6 AM. He has nocturia usually once or twice per average night. He denies morning headaches. He has occasional restless leg symptoms in the right leg only, started after his stroke. He has a feeling of fidgetiness in the right leg and moving it helps. He denies any actual pain or numbness on the right side. He has no actual major residual symptoms on the right side with the exception of difficulty with fine motor skills and not always picking up his right foot while walking. He tries to be active. He walks about 2 miles per day. He drinks 1 glass of wine per day,   He quit smoking about 30 years ago, drinks caffeine in the form of coffee 2-4 cups per day, also some soda during  the day but noncaffeine. He has 2 children from his first marriage and 2 children from his current wife. He is retired. He has noted short-term memory issues for the past year or so, really since his stroke. He has some forgetfulness, denies any problems driving.   He has seen cardiologist, Dr. Einar Gip, and workup has been benign per their verbal report. He had a Holter monitor which was benign as well, due to his history of syncope and collapse. He is due to have a 24-hour blood pressure monitor placed. Implantable loop monitor was previously discussed as well, as I understand.   His Past Medical History Is Significant For: Past Medical History:  Diagnosis Date  . Accelerated hypertension 06/29/2014  . Arthritis    Back  . Chronic kidney disease   . Dizziness and giddiness   . Enlarged prostate   . Esophagitis 05/10/2011  . Foley catheter  in place   . FOOT SPRAIN, RIGHT 07/18/2007   Qualifier: Diagnosis of  By: Aline Brochure MD, [name]    . GERD (gastroesophageal reflux disease)   . HOH (hard of hearing)   . Hx-TIA (transient ischemic attack)    hx of this- wife states 10 years ago-but Dr. Estil Daft told wife he had had several-has 2 stents in carotid artery-left  . Hypercholesteremia   . Hypertension   . Low back pain   . Nocturia   . Stroke [facility])    had a stroke 05/23/2014  . Syncope and collapse     His Past Surgical History Is Significant For: Past Surgical History:  Procedure Laterality Date  . carotid stents  05/2014   2 stents in carotid artery on left side  . COLONOSCOPY    . COLONOSCOPY  05/17/2011   Procedure: COLONOSCOPY;  Surgeon: [name], MD;  [facility];  Service: Endoscopy;  Laterality: N/A;  830  . epidural steroid injection  05/22/2014   had this in lumbar and at 1 am on 05/23/2014 had a stroke  . ESOPHAGOGASTRODUODENOSCOPY  10/28/2010   Procedure: ESOPHAGOGASTRODUODENOSCOPY (EGD);  Surgeon: [name], MD;  [facility];  Service:  Endoscopy;  Laterality: N/A;  10:30  . ESOPHAGOGASTRODUODENOSCOPY N/A 04/24/2014   Procedure: ESOPHAGOGASTRODUODENOSCOPY (EGD);  Surgeon: [name], MD;  [facility];  Service: Endoscopy;  Laterality: N/A;  815  . ESOPHAGOGASTRODUODENOSCOPY N/A 06/26/2014   Procedure: ESOPHAGOGASTRODUODENOSCOPY (EGD);  Surgeon: [name], MD;  [facility];  Service: Endoscopy;  Laterality: N/A;  . EUS N/A 06/26/2014   Procedure: FULL UPPER ENDOSCOPIC ULTRASOUND (EUS) RADIAL;  Surgeon: [name], MD;  Location: [facility];  Service: Endoscopy;  Laterality: N/A;  . IR GENERIC HISTORICAL  10/12/2015   IR ANGIO VERTEBRAL SEL VERTEBRAL BILAT MOD SED 10/12/2015 Luanne Bras, MD [facility]  . IR GENERIC HISTORICAL  10/12/2015   IR ANGIO INTRA EXTRACRAN SEL COM CAROTID INNOMINATE BILAT MOD SED 10/12/2015 Luanne Bras, MD [facility]  . RADIOLOGY WITH ANESTHESIA N/A 05/23/2014   Procedure: RADIOLOGY WITH ANESTHESIA;  Surgeon: Medication Radiologist, MD;  Location: [place];  Service: Radiology;  Laterality: N/A;  . RESECTION DISTAL CLAVICAL Right 12/13/2012   Procedure: RESECTION DISTAL CLAVICAL;  Surgeon: Carole Civil, MD;  Location: [facility];  Service: Orthopedics;  Laterality: Right;  . SHOULDER ARTHROSCOPY WITH BICEPSTENOTOMY Right 12/13/2012   Procedure: SHOULDER ARTHROSCOPY WITH BICEPS TENOTOMY, limited shoulder debridement;  Surgeon: Carole Civil, MD;  Location: [facility];  Service: Orthopedics;  Laterality: Right;  . SHOULDER SURGERY Right    torn cartiledge  . SHOULDER SURGERY     right shoulder  . TRANSURETHRAL RESECTION OF PROSTATE N/A 07/09/2014   Procedure: TRANSURETHRAL RESECTION of prostate WITH BUTTON ;  Surgeon: [name], MD;  Location: [facility];  Service: Urology;  Laterality: N/A;    His Family History Is Significant For: Family History  Problem Relation Age of Onset  . Thyroid disease Mother   . Colon cancer Neg Hx     His Social History Is  Significant For: Social History   Socioeconomic History  . Marital status: Married    Spouse name: Not on file  . Number of children: 4  . Years of education: 51  . Highest education level: Not on file  Occupational History  . Occupation: retired    Comment: [employer]  Social Needs  . Financial resource strain: Not on file  . Food  insecurity:    Worry: Not on file    Inability: Not on file  . Transportation needs:    Medical: Not on file    Non-medical: Not on file  Tobacco Use  . Smoking status: Former Smoker    Packs/day: 0.50    Years: 35.00    Pack years: 17.50    Types: Cigarettes    Last attempt to quit: 12/10/1994    Years since quitting: 22.6  . Smokeless tobacco: Never Used  Substance and Sexual Activity  . Alcohol use: Yes    Alcohol/week: 1.8 oz    Types: 3 Glasses of wine per week  . Drug use: No  . Sexual activity: Yes    Birth control/protection: None  Lifestyle  . Physical activity:    Days per week: Not on file    Minutes per session: Not on file  . Stress: Not on file  Relationships  . Social connections:    Talks on phone: Not on file    Gets together: Not on file    Attends religious service: Not on file    Active member of club or organization: Not on file    Attends meetings of clubs or organizations: Not on file    Relationship status: Not on file  Other Topics Concern  . Not on file  Social History Narrative   Married, retired - [employer]   Right handed   Caffeine use - 3-4 cups coffee daily    His Allergies Are:  No Known Allergies:   His Current Medications Are:  Outpatient Encounter Medications as of 07/25/2017  Medication Sig  . aspirin 81 MG tablet Take 81 mg by mouth daily.  Marland Kitchen atenolol (TENORMIN) 50 MG tablet Take 50 mg by mouth daily.  Marland Kitchen atorvastatin (LIPITOR) 40 MG tablet Take 1 tablet (40 mg total) by mouth every evening.  . chlorthalidone (HYGROTON) 25 MG tablet Take 25 mg by mouth daily.  . Cholecalciferol (VITAMIN D  PO) Take 1 capsule by mouth every evening.  . clopidogrel (PLAVIX) 75 MG tablet TAKE 1 TABLET DAILY  . finasteride (PROSCAR) 5 MG tablet Take 1 tablet (5 mg total) by mouth daily.  . Fish Oil-Cholecalciferol (FISH OIL + D3) 1000-1000 MG-UNIT CAPS Take 1 capsule by mouth daily.  . hydrALAZINE (APRESOLINE) 25 MG tablet Take 25 mg by mouth 3 (three) times daily.  . Olmesartan Medoxomil (BENICAR PO) Take by mouth.  Marland Kitchen omeprazole (PRILOSEC) 40 MG capsule Take 40 mg by mouth daily.   . [DISCONTINUED] amLODipine (NORVASC) 10 MG tablet Take 10 mg by mouth daily.   . [DISCONTINUED] lisinopril-hydrochlorothiazide (PRINZIDE,ZESTORETIC) 20-12.5 MG tablet Take 1 tablet by mouth daily.   . [DISCONTINUED] metoprolol tartrate (LOPRESSOR) 25 MG tablet Take 25 mg by mouth 2 (two) times daily.  . [DISCONTINUED] spironolactone (ALDACTONE) 25 MG tablet Take 25 mg by mouth daily.   No facility-administered encounter medications on file as of 07/25/2017.   :  Review of Systems:  Out of a complete 14 point review of systems, all are reviewed and negative with the exception of these symptoms as listed below:  Review of Systems  Neurological:       Pt presents today to discuss his cpap, which he reports is going well.    Objective:  Neurological Exam  Physical Exam Physical Examination:   Vitals:   07/25/17 0934  BP: (!) 164/72  Pulse: (!) 56   General Examination: The patient is a very pleasant 83 y.o. male in  no acute distress. He appears well-developed and well-nourished and well groomed.   HEENT:Normocephalic, atraumatic, pupils are equal, round and reactive to light and accommodation. Extraocular tracking is good without limitation to gaze excursion or nystagmus noted. Normal smooth pursuit is noted. Hearing is impaired and he has bilateral hearing aids in place (he reports he needs to get them checked). Face is slightly asymmetric with possible effacement of right nasolabial fold, stable. Speech is  clear with no dysarthria noted. There is no hypophonia. There is no lip, neck/head, jaw or voice tremor. Neck is supple with full range of passive and active motion. Oropharynx exam reveals: mild mouth dryness, adequate dental hygiene and moderate airway crowding. Mallampati is class II. Tongue protrudes centrally and palate elevates symmetrically.   Chest:Clear to auscultation without wheezing, rhonchi or crackles noted.  Heart:S1+S2+0, regular and normal without murmurs, rubs or gallops noted.   Abdomen:Soft, non-tender and non-distended with normal bowel sounds appreciated on auscultation.  Extremities:There is trace edema in the distal lower extremities bilaterally.   Skin: Warm and dry without trophic changes noted. There are no varicose veins.  Musculoskeletal: exam reveals no obvious joint deformities, tenderness or joint swelling or erythema.   Neurologically:  Mental status: The patient is awake, alert and oriented in all 4 spheres. His immediate and remote memory, attention, language skills and fund of knowledge are appropriate. There is no evidence of aphasia, agnosia, apraxia or anomia. Speech is clear with normal prosody and enunciation. Thought process is linear. Mood is normal and affect is normal.  Cranial nerves II - XII are as described above under HEENT exam. Motor exam: Normal bulk, strength and tone is noted. There is no tremor. Reflexes are 1+ throughout, absent in both ankles, right side a little more prominent than left as far as reflexes in his arm and knee. Fine motor skills and coordination: mild difficulty with the right upper and lower extremity, normal on the left.  Cerebellar testing: No dysmetria or intention tremor.  Sensory exam: Intact in the upper extremities, unchanged in the lower extremities.  Gait, station and balance: He stands easily. No veering to one side is noted. No leaning to one side is noted. Posture is age-appropriate and stance is  narrow based. Gait shows normal stride length and normal pace. No problems turning are noted.   Assessment and Plan:  In summary, SAMUELL KNOBLE is a very pleasant 83 year old male with an underlying medical history of bilateral carotid artery stenosis, left MCA stroke in April 2016 with status post endovascular re-vascularization through stent placement, has been seen in stroke clinic for this, history of syncope, orthostatic hypotension, hypertension, hyperlipidemia, vitamin D deficiency, and obesity, whopresents for follow-up consultation of his obstructive sleep apnea, well established on CPAP at home with full compliance. He is highly commended for this. He has done well. We increased his pressure a year and a half ago or so from 7 cm to 8 cm with success. He has gained weight. His AHI is borderline at this time. He is encouraged to work on weight loss. Of note, he had a baseline sleep study in July 2017, followed by a CPAP titration study in August 2017. I suggested a one-year checkup for my end of things, He can see Cecille Rubin, nurse practitioner next time. I answered all their questions today and the patient and his wife were in agreement. I spent 20 minutes in total face-to-face time with the patient, more than 50% of which was spent in  counseling and coordination of care, reviewing test results, reviewing medication and discussing or reviewing the diagnosis of OSA, its prognosis and treatment options. Pertinent laboratory and imaging test results that were available during this visit with the patient were reviewed by me and considered in my medical decision making (see chart for details).

## 2017-07-25 NOTE — Patient Instructions (Addendum)

## 2017-08-23 NOTE — Telephone Encounter (Signed)
Phone call attempt 

## 2017-10-17 DIAGNOSIS — M545 Low back pain: Secondary | ICD-10-CM | POA: Diagnosis not present

## 2017-10-17 DIAGNOSIS — M546 Pain in thoracic spine: Secondary | ICD-10-CM | POA: Diagnosis not present

## 2017-10-17 DIAGNOSIS — M9903 Segmental and somatic dysfunction of lumbar region: Secondary | ICD-10-CM | POA: Diagnosis not present

## 2017-10-17 DIAGNOSIS — M9902 Segmental and somatic dysfunction of thoracic region: Secondary | ICD-10-CM | POA: Diagnosis not present

## 2017-10-17 DIAGNOSIS — M9905 Segmental and somatic dysfunction of pelvic region: Secondary | ICD-10-CM | POA: Diagnosis not present

## 2017-10-23 ENCOUNTER — Ambulatory Visit (INDEPENDENT_AMBULATORY_CARE_PROVIDER_SITE_OTHER): Payer: Medicare Other | Admitting: Urology

## 2017-10-23 DIAGNOSIS — R351 Nocturia: Secondary | ICD-10-CM | POA: Diagnosis not present

## 2017-10-23 DIAGNOSIS — N401 Enlarged prostate with lower urinary tract symptoms: Secondary | ICD-10-CM | POA: Diagnosis not present

## 2017-10-23 DIAGNOSIS — R310 Gross hematuria: Secondary | ICD-10-CM | POA: Diagnosis not present

## 2017-11-02 ENCOUNTER — Emergency Department (HOSPITAL_COMMUNITY): Payer: Medicare Other

## 2017-11-02 ENCOUNTER — Observation Stay (HOSPITAL_COMMUNITY)
Admission: EM | Admit: 2017-11-02 | Discharge: 2017-11-03 | Disposition: A | Discharge status: Home / Self Care | Payer: Medicare Other | Attending: Internal Medicine | Admitting: Internal Medicine

## 2017-11-02 ENCOUNTER — Other Ambulatory Visit (HOSPITAL_COMMUNITY): Payer: Self-pay | Admitting: Interventional Radiology

## 2017-11-02 ENCOUNTER — Other Ambulatory Visit: Payer: Self-pay

## 2017-11-02 ENCOUNTER — Encounter (HOSPITAL_COMMUNITY): Payer: Self-pay | Admitting: Emergency Medicine

## 2017-11-02 DIAGNOSIS — R4020 Unspecified coma: Secondary | ICD-10-CM | POA: Diagnosis not present

## 2017-11-02 DIAGNOSIS — I129 Hypertensive chronic kidney disease with stage 1 through stage 4 chronic kidney disease, or unspecified chronic kidney disease: Secondary | ICD-10-CM | POA: Diagnosis not present

## 2017-11-02 DIAGNOSIS — Z7982 Long term (current) use of aspirin: Secondary | ICD-10-CM | POA: Diagnosis not present

## 2017-11-02 DIAGNOSIS — N189 Chronic kidney disease, unspecified: Secondary | ICD-10-CM | POA: Diagnosis not present

## 2017-11-02 DIAGNOSIS — Z79899 Other long term (current) drug therapy: Secondary | ICD-10-CM | POA: Insufficient documentation

## 2017-11-02 DIAGNOSIS — E876 Hypokalemia: Secondary | ICD-10-CM

## 2017-11-02 DIAGNOSIS — Z87891 Personal history of nicotine dependence: Secondary | ICD-10-CM | POA: Diagnosis not present

## 2017-11-02 DIAGNOSIS — N179 Acute kidney failure, unspecified: Secondary | ICD-10-CM

## 2017-11-02 DIAGNOSIS — Z8673 Personal history of transient ischemic attack (TIA), and cerebral infarction without residual deficits: Secondary | ICD-10-CM | POA: Insufficient documentation

## 2017-11-02 DIAGNOSIS — R55 Syncope and collapse: Secondary | ICD-10-CM | POA: Diagnosis not present

## 2017-11-02 DIAGNOSIS — I771 Stricture of artery: Secondary | ICD-10-CM

## 2017-11-02 LAB — COMPREHENSIVE METABOLIC PANEL
ALT: 20 U/L (ref 0–44)
AST: 23 U/L (ref 15–41)
Albumin: 3.7 g/dL (ref 3.5–5.0)
Alkaline Phosphatase: 37 U/L — ABNORMAL LOW (ref 38–126)
Anion gap: 7 (ref 5–15)
BUN: 19 mg/dL (ref 8–23)
CO2: 26 mmol/L (ref 22–32)
Calcium: 8.7 mg/dL — ABNORMAL LOW (ref 8.9–10.3)
Chloride: 99 mmol/L (ref 98–111)
Creatinine, Ser: 1.28 mg/dL — ABNORMAL HIGH (ref 0.61–1.24)
GFR calc Af Amer: 59 mL/min — ABNORMAL LOW (ref >60)
GFR calc non Af Amer: 51 mL/min — ABNORMAL LOW (ref >60)
Glucose, Bld: 105 mg/dL — ABNORMAL HIGH (ref 70–99)
Potassium: 3.1 mmol/L — ABNORMAL LOW (ref 3.5–5.1)
Sodium: 132 mmol/L — ABNORMAL LOW (ref 135–145)
Total Bilirubin: 0.6 mg/dL (ref 0.3–1.2)
Total Protein: 6.9 g/dL (ref 6.5–8.1)

## 2017-11-02 LAB — CBC WITH DIFFERENTIAL/PLATELET
BASOS ABS: 0 10*3/uL (ref 0.0–0.1)
BASOS PCT: 0 %
EOS ABS: 0.5 10*3/uL (ref 0.0–0.7)
Eosinophils Relative: 4 %
HEMATOCRIT: 40 % (ref 39.0–52.0)
Hemoglobin: 13.2 g/dL (ref 13.0–17.0)
Lymphocytes Relative: 48 %
Lymphs Abs: 5.6 10*3/uL — ABNORMAL HIGH (ref 0.7–4.0)
MCH: 28.3 pg (ref 26.0–34.0)
MCHC: 33 g/dL (ref 30.0–36.0)
MCV: 85.8 fL (ref 78.0–100.0)
Monocytes Absolute: 1.1 10*3/uL — ABNORMAL HIGH (ref 0.1–1.0)
Monocytes Relative: 10 %
NEUTROS ABS: 4.4 10*3/uL (ref 1.7–7.7)
Neutrophils Relative %: 38 %
PLATELETS: 222 10*3/uL (ref 150–400)
RBC: 4.66 MIL/uL (ref 4.22–5.81)
RDW: 13.6 % (ref 11.5–15.5)
WBC: 11.6 10*3/uL — ABNORMAL HIGH (ref 4.0–10.5)

## 2017-11-02 LAB — URINALYSIS, ROUTINE W REFLEX MICROSCOPIC
Bilirubin Urine: NEGATIVE
Glucose, UA: NEGATIVE mg/dL
HGB URINE DIPSTICK: NEGATIVE
Ketones, ur: NEGATIVE mg/dL
Leukocytes, UA: NEGATIVE
Nitrite: NEGATIVE
PROTEIN: NEGATIVE mg/dL
Specific Gravity, Urine: 1.015 (ref 1.005–1.030)
pH: 7 (ref 5.0–8.0)

## 2017-11-02 LAB — MAGNESIUM: MAGNESIUM: 2.2 mg/dL (ref 1.7–2.4)

## 2017-11-02 LAB — CBG MONITORING, ED: GLUCOSE-CAPILLARY: 110 mg/dL — AB (ref 70–99)

## 2017-11-02 LAB — TROPONIN I: Troponin I: <0.03 ng/mL (ref <0.03)

## 2017-11-02 MED ORDER — HYDRALAZINE HCL 25 MG PO TABS
25.0000 mg | ORAL_TABLET | Freq: Three times a day (TID) | ORAL | Status: DC
  Administered 2017-11-03: 25 mg via ORAL
  Filled 2017-11-02 (×2): qty 1

## 2017-11-02 MED ORDER — ACETAMINOPHEN 325 MG PO TABS: 650.0000 mg | ORAL_TABLET | Freq: Four times a day (QID) | ORAL | Status: DC | PRN

## 2017-11-02 MED ORDER — ENOXAPARIN SODIUM 40 MG/0.4ML ~~LOC~~ SOLN
40.0000 mg | SUBCUTANEOUS | Status: DC
  Administered 2017-11-02: 40 mg via SUBCUTANEOUS
  Filled 2017-11-02: qty 0.4

## 2017-11-02 MED ORDER — FINASTERIDE 5 MG PO TABS
5.0000 mg | ORAL_TABLET | Freq: Every day | ORAL | Status: DC
  Administered 2017-11-02: 5 mg via ORAL
  Filled 2017-11-02 (×4): qty 1

## 2017-11-02 MED ORDER — POTASSIUM CHLORIDE CRYS ER 20 MEQ PO TBCR
40.0000 meq | EXTENDED_RELEASE_TABLET | ORAL | Status: AC
  Administered 2017-11-02 – 2017-11-03 (×2): 40 meq via ORAL
  Filled 2017-11-02 (×2): qty 2

## 2017-11-02 MED ORDER — ASPIRIN 81 MG PO CHEW
81.0000 mg | CHEWABLE_TABLET | Freq: Every day | ORAL | Status: DC
  Administered 2017-11-03: 81 mg via ORAL
  Filled 2017-11-02: qty 1

## 2017-11-02 MED ORDER — ONDANSETRON HCL 4 MG PO TABS: 4.0000 mg | ORAL_TABLET | Freq: Four times a day (QID) | ORAL | Status: DC | PRN

## 2017-11-02 MED ORDER — SODIUM CHLORIDE 0.9 % IV SOLN
INTRAVENOUS | Status: DC
  Administered 2017-11-02 – 2017-11-03 (×2): via INTRAVENOUS

## 2017-11-02 MED ORDER — OMEGA-3-ACID ETHYL ESTERS 1 G PO CAPS
1.0000 | ORAL_CAPSULE | Freq: Every day | ORAL | Status: DC
  Administered 2017-11-03: 1 g via ORAL
  Filled 2017-11-02: qty 1

## 2017-11-02 MED ORDER — ATORVASTATIN CALCIUM 40 MG PO TABS
40.0000 mg | ORAL_TABLET | Freq: Every evening | ORAL | Status: DC
  Administered 2017-11-02: 40 mg via ORAL
  Filled 2017-11-02: qty 1

## 2017-11-02 MED ORDER — CLOPIDOGREL BISULFATE 75 MG PO TABS
75.0000 mg | ORAL_TABLET | Freq: Every day | ORAL | Status: DC
  Administered 2017-11-03: 75 mg via ORAL
  Filled 2017-11-02: qty 1

## 2017-11-02 MED ORDER — ONDANSETRON HCL 4 MG/2ML IJ SOLN: 4.0000 mg | Freq: Four times a day (QID) | INTRAMUSCULAR | Status: DC | PRN

## 2017-11-02 MED ORDER — ACETAMINOPHEN 650 MG RE SUPP: 650.0000 mg | Freq: Four times a day (QID) | RECTAL | Status: DC | PRN

## 2017-11-02 MED ORDER — POTASSIUM CHLORIDE CRYS ER 20 MEQ PO TBCR
40.0000 meq | EXTENDED_RELEASE_TABLET | Freq: Once | ORAL | Status: AC
  Administered 2017-11-02: 40 meq via ORAL
  Filled 2017-11-02: qty 2

## 2017-11-02 MED ORDER — CHLORTHALIDONE 25 MG PO TABS
25.0000 mg | ORAL_TABLET | Freq: Every day | ORAL | Status: DC
  Administered 2017-11-03: 25 mg via ORAL
  Filled 2017-11-02: qty 1

## 2017-11-02 MED ORDER — ATENOLOL 25 MG PO TABS
50.0000 mg | ORAL_TABLET | Freq: Every day | ORAL | Status: DC
  Administered 2017-11-03: 50 mg via ORAL
  Filled 2017-11-02: qty 2

## 2017-11-02 MED ORDER — PANTOPRAZOLE SODIUM 40 MG PO TBEC
40.0000 mg | DELAYED_RELEASE_TABLET | Freq: Every day | ORAL | Status: DC
  Administered 2017-11-03: 40 mg via ORAL
  Filled 2017-11-02: qty 1

## 2017-11-02 NOTE — ED Notes (Signed)
Report given/ pt taken off monitor to be moved into hallways due to high census

## 2017-11-02 NOTE — H&P (Addendum)
TRH H&P    Patient Demographics:    Henry [name], is a 83 y.o. male  MRN: [id number]  DOB - 1934/11/27  Admit Date - 11/02/2017  Referring MD/NP/PA: Dr. Thurnell Garbe  Outpatient Primary MD for the patient is Leanna Battles, MD  Patient coming from: Home  Chief complaint-passed out   HPI:    Henry [name]  is a 83 y.o. male, with history of hypertension, stroke, left carotid artery stents on antiplatelet therapy with Plavix, recurrent syncope was brought to hospital after patient had an episode of unresponsiveness while driving back from [place].  Patient was on the passenger side and his friend was driving and noticed that patient suddenly became quite and was not responding.  The episode lasted for a few minutes.  There was no reported seizure-like activity.  No bladder or bowel incontinence. Patient has previous history of CVA, MRI brain done in the ED shows no acute stroke. Patient has no focal neurological deficit.   Patient tells me that he has had multiple episodes of syncope in the past as he has been on diuretics for long time.  He says that he usually gets these episodes when he is  dehydrated.  His cardiologist Dr. Einar Gip is aware of patient's recurrent syncope.  His last echo was from 2016, which showed EF 65 to 70%, no wall motion abnormality, grade 1 diastolic dysfunction.  Denies nausea vomiting or diarrhea Denies dysuria urgency or frequency of urination. No focal weakness of extremities No chest pain or shortness of breath    Review of systems:    In addition to the HPI above,   All other systems reviewed and are negative.   With Past History of the following :    Past Medical History:  Diagnosis Date  . Accelerated hypertension 06/29/2014  . Arthritis    Back  . Chronic kidney disease   . Dizziness and giddiness   . Enlarged prostate   . Esophagitis 05/10/2011  . Foley  catheter in place   . FOOT SPRAIN, RIGHT 07/18/2007   Qualifier: Diagnosis of  By: Aline Brochure MD, [name]    . GERD (gastroesophageal reflux disease)   . HOH (hard of hearing)   . Hx-TIA (transient ischemic attack)    hx of this- wife states 10 years ago-but Dr. Estil Daft told wife he had had several-has 2 stents in carotid artery-left  . Hypercholesteremia   . Hypertension   . Low back pain   . Nocturia   . Stroke [facility])    had a stroke 05/23/2014  . Syncope and collapse       Past Surgical History:  Procedure Laterality Date  . carotid stents  05/2014   2 stents in carotid artery on left side  . COLONOSCOPY    . COLONOSCOPY  05/17/2011   Procedure: COLONOSCOPY;  Surgeon: [name], MD;  [facility];  Service: Endoscopy;  Laterality: N/A;  830  . epidural steroid injection  05/22/2014   had this in lumbar and at 1 am on 05/23/2014 had a  stroke  . ESOPHAGOGASTRODUODENOSCOPY  10/28/2010   Procedure: ESOPHAGOGASTRODUODENOSCOPY (EGD);  Surgeon: [name], MD;  [facility];  Service: Endoscopy;  Laterality: N/A;  10:30  . ESOPHAGOGASTRODUODENOSCOPY N/A 04/24/2014   Procedure: ESOPHAGOGASTRODUODENOSCOPY (EGD);  Surgeon: [name], MD;  [facility];  Service: Endoscopy;  Laterality: N/A;  815  . ESOPHAGOGASTRODUODENOSCOPY N/A 06/26/2014   Procedure: ESOPHAGOGASTRODUODENOSCOPY (EGD);  Surgeon: [name], MD;  Location: [facility];  Service: Endoscopy;  Laterality: N/A;  . EUS N/A 06/26/2014   Procedure: FULL UPPER ENDOSCOPIC ULTRASOUND (EUS) RADIAL;  Surgeon: [name], MD;  Location: [facility];  Service: Endoscopy;  Laterality: N/A;  . IR GENERIC HISTORICAL  10/12/2015   IR ANGIO VERTEBRAL SEL VERTEBRAL BILAT MOD SED 10/12/2015 Luanne Bras, MD [facility]  . IR GENERIC HISTORICAL  10/12/2015   IR ANGIO INTRA EXTRACRAN SEL COM CAROTID INNOMINATE BILAT MOD SED 10/12/2015 Luanne Bras, MD [facility]  . RADIOLOGY WITH  ANESTHESIA N/A 05/23/2014   Procedure: RADIOLOGY WITH ANESTHESIA;  Surgeon: Medication Radiologist, MD;  Location: [place];  Service: Radiology;  Laterality: N/A;  . RESECTION DISTAL CLAVICAL Right 12/13/2012   Procedure: RESECTION DISTAL CLAVICAL;  Surgeon: Carole Civil, MD;  Location: [facility];  Service: Orthopedics;  Laterality: Right;  . SHOULDER ARTHROSCOPY WITH BICEPSTENOTOMY Right 12/13/2012   Procedure: SHOULDER ARTHROSCOPY WITH BICEPS TENOTOMY, limited shoulder debridement;  Surgeon: Carole Civil, MD;  Location: [facility];  Service: Orthopedics;  Laterality: Right;  . SHOULDER SURGERY Right    torn cartiledge  . SHOULDER SURGERY     right shoulder  . TRANSURETHRAL RESECTION OF PROSTATE N/A 07/09/2014   Procedure: TRANSURETHRAL RESECTION of prostate WITH BUTTON ;  Surgeon: [name], MD;  Location: [facility];  Service: Urology;  Laterality: N/A;      Social History:      Social History   Tobacco Use  . Smoking status: Former Smoker    Packs/day: 0.50    Years: 35.00    Pack years: 17.50    Types: Cigarettes    Last attempt to quit: 12/10/1994    Years since quitting: 22.9  . Smokeless tobacco: Never Used  Substance Use Topics  . Alcohol use: Yes    Alcohol/week: 3.0 standard drinks    Types: 3 Glasses of wine per week       Family History :     Family History  Problem Relation Age of Onset  . Thyroid disease Mother   . Colon cancer Neg Hx       Home Medications:   Prior to Admission medications   Medication Sig Start Date End Date Taking? Authorizing Provider  aspirin 81 MG tablet Take 81 mg by mouth daily.    [provider]  atenolol (TENORMIN) 50 MG tablet Take 50 mg by mouth daily.    [provider]  atorvastatin (LIPITOR) 40 MG tablet Take 1 tablet (40 mg total) by mouth every evening. 06/05/14   Angiulli, Lavon Paganini, PA-C  chlorthalidone (HYGROTON) 25 MG tablet Take 25 mg by mouth daily. 04/17/17   [provider]    Cholecalciferol (VITAMIN D PO) Take 1 capsule by mouth every evening.    [provider]  clopidogrel (PLAVIX) 75 MG tablet TAKE 1 TABLET DAILY 05/14/15   Rosalin Hawking, MD  finasteride (PROSCAR) 5 MG tablet Take 1 tablet (5 mg total) by mouth daily. 06/03/14   [name], MD  Fish Oil-Cholecalciferol (FISH OIL + D3) 1000-1000 MG-UNIT  CAPS Take 1 capsule by mouth daily.    [provider]  hydrALAZINE (APRESOLINE) 25 MG tablet Take 25 mg by mouth 3 (three) times daily.    [provider]  Olmesartan Medoxomil (BENICAR PO) Take by mouth.    [provider]  omeprazole (PRILOSEC) 40 MG capsule Take 40 mg by mouth daily.  07/15/15   [provider]     Allergies:    No Known Allergies   Physical Exam:   Vitals  Blood pressure 132/61, pulse 66, temperature 97.9 F (36.6 C), temperature source Oral, resp. rate 18, SpO2 98 %.  1.  General: Appears in no acute distress  2. Psychiatric:  Intact judgement and  insight, awake alert, oriented x 3.  3. Neurologic: No focal neurological deficits, all cranial nerves intact.Strength 5/5 all 4 extremities, sensation intact all 4 extremities, plantars down going.  4. Eyes :  anicteric sclerae, moist conjunctivae with no lid lag. PERRLA.  5. ENMT:  Oropharynx clear with moist mucous membranes and good dentition  6. Neck:  supple, no cervical lymphadenopathy appriciated, No thyromegaly  7. Respiratory : Normal respiratory effort, good air movement bilaterally,clear to  auscultation bilaterally  8. Cardiovascular : RRR, no gallops, rubs or murmurs, no leg edema  9. Gastrointestinal:  Positive bowel sounds, abdomen soft, non-tender to palpation,no hepatosplenomegaly, no rigidity or guarding       10. Skin:  No cyanosis, normal texture and turgor, no rash, lesions or ulcers  11.Musculoskeletal:  Good muscle tone,  joints appear normal , no effusions,  normal range of motion    Data Review:     CBC Recent Labs  Lab 11/02/17 1706  WBC 11.6*  HGB 13.2  HCT 40.0  PLT 222  MCV 85.8  MCH 28.3  MCHC 33.0  RDW 13.6  LYMPHSABS 5.6*  MONOABS 1.1*  EOSABS 0.5  BASOSABS 0.0   ------------------------------------------------------------------------------------------------------------------  Results for orders placed or performed during the hospital encounter of 11/02/17 (from the past 48 hour(s))  CBG monitoring, ED     Status: Abnormal   Collection Time: 11/02/17  4:49 PM  Result Value Ref Range   Glucose-Capillary 110 (H) 70 - 99 mg/dL  Troponin I     Status: None   Collection Time: 11/02/17  5:06 PM  Result Value Ref Range   Troponin I <0.03 <0.03 ng/mL    Comment: Performed at [facility], [address] [place], [place] [address]  Comprehensive metabolic panel     Status: Abnormal   Collection Time: 11/02/17  5:06 PM  Result Value Ref Range   Sodium 132 (L) 135 - 145 mmol/L   Potassium 3.1 (L) 3.5 - 5.1 mmol/L   Chloride 99 98 - 111 mmol/L   CO2 26 22 - 32 mmol/L   Glucose, Bld 105 (H) 70 - 99 mg/dL   BUN 19 8 - 23 mg/dL   Creatinine, Ser 1.28 (H) 0.61 - 1.24 mg/dL   Calcium 8.7 (L) 8.9 - 10.3 mg/dL   Total Protein 6.9 6.5 - 8.1 g/dL   Albumin 3.7 3.5 - 5.0 g/dL   AST 23 15 - 41 U/L   ALT 20 0 - 44 U/L   Alkaline Phosphatase 37 (L) 38 - 126 U/L   Total Bilirubin 0.6 0.3 - 1.2 mg/dL   GFR calc non Af Amer 51 (L) >60 mL/min   GFR calc Af Amer 59 (L) >60 mL/min    Comment: (NOTE) The eGFR has been calculated using the  CKD EPI equation. This calculation has not been validated in all clinical situations. eGFR's persistently <60 mL/min signify possible Chronic Kidney Disease.    Anion gap 7 5 - 15    Comment: Performed at [facility], [address], [place], [place] [address]  CBC with Differential     Status: Abnormal   Collection Time: 11/02/17  5:06 PM  Result Value Ref Range   WBC 11.6 (H) 4.0 - 10.5 K/uL   RBC 4.66 4.22 - 5.81 MIL/uL   Hemoglobin  13.2 13.0 - 17.0 g/dL   HCT 40.0 39.0 - 52.0 %   MCV 85.8 78.0 - 100.0 fL   MCH 28.3 26.0 - 34.0 pg   MCHC 33.0 30.0 - 36.0 g/dL   RDW 13.6 11.5 - 15.5 %   Platelets 222 150 - 400 K/uL   Neutrophils Relative % 38 %   Neutro Abs 4.4 1.7 - 7.7 K/uL   Lymphocytes Relative 48 %   Lymphs Abs 5.6 (H) 0.7 - 4.0 K/uL   Monocytes Relative 10 %   Monocytes Absolute 1.1 (H) 0.1 - 1.0 K/uL   Eosinophils Relative 4 %   Eosinophils Absolute 0.5 0.0 - 0.7 K/uL   Basophils Relative 0 %   Basophils Absolute 0.0 0.0 - 0.1 K/uL    Comment: Performed at [facility], [address] [place], [place] [address]  Urinalysis, Routine w reflex microscopic     Status: None   Collection Time: 11/02/17  6:38 PM  Result Value Ref Range   Color, Urine YELLOW YELLOW   APPearance CLEAR CLEAR   Specific Gravity, Urine 1.015 1.005 - 1.030   pH 7.0 5.0 - 8.0   Glucose, UA NEGATIVE NEGATIVE mg/dL   Hgb urine dipstick NEGATIVE NEGATIVE   Bilirubin Urine NEGATIVE NEGATIVE   Ketones, ur NEGATIVE NEGATIVE mg/dL   Protein, ur NEGATIVE NEGATIVE mg/dL   Nitrite NEGATIVE NEGATIVE   Leukocytes, UA NEGATIVE NEGATIVE    Comment: Performed at [facility], [address] [place], [place] [address]    Chemistries  Recent Labs  Lab 11/02/17 1706  NA 132*  K 3.1*  CL 99  CO2 26  GLUCOSE 105*  BUN 19  CREATININE 1.28*  CALCIUM 8.7*  AST 23  ALT 20  ALKPHOS 37*  BILITOT 0.6   ------------------------------------------------------------------------------------------------------------------  ------------------------------------------------------------------------------------------------------------------ GFR: CrCl cannot be calculated (Unknown ideal weight.). Liver Function Tests: Recent Labs  Lab 11/02/17 1706  AST 23  ALT 20  ALKPHOS 37*  BILITOT 0.6  PROT 6.9  ALBUMIN 3.7   No results for input(s): LIPASE, AMYLASE in the last 168 hours. No results for input(s): AMMONIA in the last 168  hours. Coagulation Profile: No results for input(s): INR, PROTIME in the last 168 hours. Cardiac Enzymes: Recent Labs  Lab 11/02/17 1706  TROPONINI <0.03   BNP (last 3 results) No results for input(s): PROBNP in the last 8760 hours. HbA1C: No results for input(s): HGBA1C in the last 72 hours. CBG: Recent Labs  Lab 11/02/17 1649  GLUCAP 110*    --------------------------------------------------------------------------------------------------------------- Urine analysis:    Component Value Date/Time   COLORURINE YELLOW 11/02/2017 1838   APPEARANCEUR CLEAR 11/02/2017 1838   LABSPEC 1.015 11/02/2017 1838   PHURINE 7.0 11/02/2017 1838   GLUCOSEU NEGATIVE 11/02/2017 1838   HGBUR NEGATIVE 11/02/2017 1838   BILIRUBINUR NEGATIVE 11/02/2017 1838   KETONESUR NEGATIVE 11/02/2017 1838   PROTEINUR NEGATIVE 11/02/2017 1838   UROBILINOGEN 1.0 07/05/2014 1239   NITRITE NEGATIVE 11/02/2017 1838  LEUKOCYTESUR NEGATIVE 11/02/2017 1838      Imaging Results:    Dg Chest 2 View  Result Date: 11/02/2017 CLINICAL DATA:  Syncopal episode lasting 6-7 minutes. EXAM: CHEST - 2 VIEW COMPARISON:  CXR 05/26/2014 FINDINGS: Stable cardiomegaly with moderate thoracic aortic atherosclerosis. Lungs are clear. No acute osseous abnormality. Mild degenerative change along the dorsal spine. IMPRESSION: Cardiomegaly with aortic atherosclerosis. No active pulmonary disease. Electronically Signed   By: Ashley Royalty M.D.   On: 11/02/2017 18:45   Mr Brain Wo Contrast (neuro Protocol)  Result Date: 11/02/2017 CLINICAL DATA:  7 minutes loss of consciousness. History of hypertension, hyperlipidemia and stroke. EXAM: MRI HEAD WITHOUT CONTRAST TECHNIQUE: Multiplanar, multiecho pulse sequences of the brain and surrounding structures were obtained without intravenous contrast. COMPARISON:  MRI head May 24, 2014. FINDINGS: INTRACRANIAL CONTENTS: No reduced diffusion to suggest acute ischemia. No susceptibility artifact  to suggest hemorrhage. LEFT frontal lobe encephalomalacia with cystic changes, ex vacuo dilatation LEFT lateral ventricle. Old bilateral basal ganglia infarcts. Old bilateral small cerebellar infarcts. Patchy to confluent white matter FLAIR T2 hyperintensities. Moderate to severe parenchymal brain volume loss. Mild lobulated ventricular margin and narrowed callosum angle. No hydrocephalus. No midline shift, mass effect or masses. No abnormal extra-axial fluid collections. VASCULAR: Normal major intracranial vascular flow voids present at skull base. SKULL AND UPPER CERVICAL SPINE: No abnormal sellar expansion. No suspicious calvarial bone marrow signal. Craniocervical junction maintained. SINUSES/ORBITS: The mastoid air-cells and included paranasal sinuses are well-aerated.The included ocular globes and orbital contents are non-suspicious. OTHER: None. IMPRESSION: 1. No acute intracranial process. 2. Old LEFT frontal/MCA territory infarct. Old small supra and infratentorial infarcts. 3. Moderate to severe chronic small vessel ischemic changes. 4. Moderate to severe parenchymal brain volume loss with superimposed image findings of normal pressure hydrocephalus. Electronically Signed   By: Elon Alas M.D.   On: 11/02/2017 18:33    My personal review of EKG: Rhythm NSR, bradycardia   Assessment & Plan:    Active Problems:   Syncope   1. Syncope-patient says that he has history of syncope and usually gets episodes when he is dehydrated.  He is on diuretics.  We will check orthostatic vital signs every 8 hours, obtain echocardiogram in a.m., obtain serial troponin every 6 hours x3.  Monitor on telemetry.  2. Hypertension-continue home medications.   3. Acute kidney injury-creatinine is 1.28, hold chlorthalidone for tonight.  Will start from tomorrow morning.  Started on IV fluids, normal saline.  Baseline creatinine 0.96.  Follow BMP in am.  4. History of CVA with stent in the left carotid  artery-continue Plavix, aspirin, Lipitor  5. Hypokalemia-potassium 3.1, will replace potassium and check BMP in a.m.  6. BPH-continue Proscar    DVT Prophylaxis-   Lovenox   AM Labs Ordered, also please review Full Orders  Family Communication: Admission, patients condition and plan of care including tests being ordered have been discussed with the patient and his wife at bedside  who indicate understanding and agree with the plan and Code Status.  Code Status: Full code  Admission status: Observation  Time spent in minutes : 60 minutes   Oswald Hillock M.D on 11/02/2017 at 7:55 PM  Between 7am to 7pm - Pager - [phone]. After 7pm go to [email or web address] - password [facility]  [facility] - Office  [phone]

## 2017-11-02 NOTE — ED Triage Notes (Signed)
Pt riding in car with friend. Friend states was talking to pt and pt stopped talking and was diaphoretic. Friend states pt had LOC for approx 6-7 minutes.pt clammy and diaphoretic upon arrival. Denies pain. C/o being weak and dizzy. A/o

## 2017-11-02 NOTE — ED Provider Notes (Signed)
[facility] EMERGENCY DEPARTMENT Provider Note   CSN: [id number] Arrival date & time: 11/02/17  1633     History   Chief Complaint Chief Complaint  Patient presents with  . Loss of Consciousness    HPI Henry [name] is a 83 y.o. male.  HPI  Pt was seen at 1655. Per pt, c/o sudden onset and resolution of one episode of syncope that occurred PTA. Pt states he was riding in a car with his friend and "then just remembers waking up here." Pt's friend stated pt "stopped talking" and was diaphoretic. Pt was unresponsive for approximately "6-7 minutes" before spontaneously awakening. Pt does not recall events. No reported shaking/seizure activity. No reported incont of bowel/bladder. Pt states he "just feels weak." Pt endorses hx of previous CVA with residual right hand weakness and dysarthria which worsens "near the end of the day every day especially if I talk a lot." Family at bedside confirms this and states pt's speech is at baseline. Denies prodromal symptoms before syncope. Denies CP/palpitations, no SOB/cough, no abd pain, no N/V/D, no focal motor weakness, no tingling/numbness in extremities.    Past Medical History:  Diagnosis Date  . Accelerated hypertension 06/29/2014  . Arthritis    Back  . Chronic kidney disease   . Dizziness and giddiness   . Enlarged prostate   . Esophagitis 05/10/2011  . Foley catheter in place   . FOOT SPRAIN, RIGHT 07/18/2007   Qualifier: Diagnosis of  By: Aline Brochure MD, [name]    . GERD (gastroesophageal reflux disease)   . HOH (hard of hearing)   . Hx-TIA (transient ischemic attack)    hx of this- wife states 10 years ago-but Dr. Estil Daft told wife he had had several-has 2 stents in carotid artery-left  . Hypercholesteremia   . Hypertension   . Low back pain   . Nocturia   . Stroke [facility])    had a stroke 05/23/2014  . Syncope and collapse     Patient Active Problem List   Diagnosis Date Noted  . Syncope and collapse 03/08/2015  .  Orthostatic hypotension 03/08/2015  . Accelerated hypertension 08/28/2014  . BPH (benign prostatic hypertrophy) with urinary obstruction 07/09/2014  . Right leg swelling 07/06/2014  . UTI (urinary tract infection) 07/06/2014  . ARF (acute renal failure) as a complication of the indwelling Foley 07/05/2014  . Cerebral infarction due to embolism of left middle cerebral artery ([place]) 06/29/2014  . Carotid stenosis 06/29/2014  . Urinary retention 06/29/2014  . Hematuria 06/29/2014  . Aphasia S/P CVA 05/28/2014  . Dysphagia, pharyngoesophageal phase 05/28/2014  . Left middle cerebral artery stroke ([place]) 05/26/2014  . Coarctation of aorta, recurrent, post-intervention   . Expressive aphasia   . Acute right-sided weakness   . Stroke ([place]) 05/23/2014  . S/P shoulder surgery 12/24/2012  . S/P arthroscopy of shoulder 12/16/2012  . Shoulder arthritis 12/13/2012  . Biceps tendon rupture, proximal 12/13/2012  . Partial tear of right subscapularis tendon 12/13/2012  . Hypertension 05/10/2011  . Hyperlipidemia 05/10/2011  . Barrett esophagus 05/10/2011    Past Surgical History:  Procedure Laterality Date  . carotid stents  05/2014   2 stents in carotid artery on left side  . COLONOSCOPY    . COLONOSCOPY  05/17/2011   Procedure: COLONOSCOPY;  Surgeon: [name], MD;  Location: [facility];  Service: Endoscopy;  Laterality: N/A;  830  . epidural steroid injection  05/22/2014   had this in lumbar and at 1 am  on 05/23/2014 had a stroke  . ESOPHAGOGASTRODUODENOSCOPY  10/28/2010   Procedure: ESOPHAGOGASTRODUODENOSCOPY (EGD);  Surgeon: [name], MD;  Location: [facility];  Service: Endoscopy;  Laterality: N/A;  10:30  . ESOPHAGOGASTRODUODENOSCOPY N/A 04/24/2014   Procedure: ESOPHAGOGASTRODUODENOSCOPY (EGD);  Surgeon: [name], MD;  [facility];  Service: Endoscopy;  Laterality: N/A;  815  . ESOPHAGOGASTRODUODENOSCOPY N/A 06/26/2014   Procedure:  ESOPHAGOGASTRODUODENOSCOPY (EGD);  Surgeon: [name], MD;  Location: [facility];  Service: Endoscopy;  Laterality: N/A;  . EUS N/A 06/26/2014   Procedure: FULL UPPER ENDOSCOPIC ULTRASOUND (EUS) RADIAL;  Surgeon: [name], MD;  Location: [facility];  Service: Endoscopy;  Laterality: N/A;  . IR GENERIC HISTORICAL  10/12/2015   IR ANGIO VERTEBRAL SEL VERTEBRAL BILAT MOD SED 10/12/2015 Luanne Bras, MD [facility]  . IR GENERIC HISTORICAL  10/12/2015   IR ANGIO INTRA EXTRACRAN SEL COM CAROTID INNOMINATE BILAT MOD SED 10/12/2015 Luanne Bras, MD [facility]  . RADIOLOGY WITH ANESTHESIA N/A 05/23/2014   Procedure: RADIOLOGY WITH ANESTHESIA;  Surgeon: Medication Radiologist, MD;  Location: [facility];  Service: Radiology;  Laterality: N/A;  . RESECTION DISTAL CLAVICAL Right 12/13/2012   Procedure: RESECTION DISTAL CLAVICAL;  Surgeon: Carole Civil, MD;  Location: [facility];  Service: Orthopedics;  Laterality: Right;  . SHOULDER ARTHROSCOPY WITH BICEPSTENOTOMY Right 12/13/2012   Procedure: SHOULDER ARTHROSCOPY WITH BICEPS TENOTOMY, limited shoulder debridement;  Surgeon: Carole Civil, MD;  Location: [facility];  Service: Orthopedics;  Laterality: Right;  . SHOULDER SURGERY Right    torn cartiledge  . SHOULDER SURGERY     right shoulder  . TRANSURETHRAL RESECTION OF PROSTATE N/A 07/09/2014   Procedure: TRANSURETHRAL RESECTION of prostate WITH BUTTON ;  Surgeon: [name], MD;  Location: [facility];  Service: Urology;  Laterality: N/A;        Home Medications    Prior to Admission medications   Medication Sig Start Date End Date Taking? Authorizing Provider  aspirin 81 MG tablet Take 81 mg by mouth daily.    [provider]  atenolol (TENORMIN) 50 MG tablet Take 50 mg by mouth daily.    [provider]  atorvastatin (LIPITOR) 40 MG tablet Take 1 tablet (40 mg total) by mouth every evening. 06/05/14   Angiulli, Lavon Paganini, PA-C  chlorthalidone (HYGROTON) 25 MG tablet  Take 25 mg by mouth daily. 04/17/17   [provider]  Cholecalciferol (VITAMIN D PO) Take 1 capsule by mouth every evening.    [provider]  clopidogrel (PLAVIX) 75 MG tablet TAKE 1 TABLET DAILY 05/14/15   Rosalin Hawking, MD  finasteride (PROSCAR) 5 MG tablet Take 1 tablet (5 mg total) by mouth daily. 06/03/14   [name], MD  Fish Oil-Cholecalciferol (FISH OIL + D3) 1000-1000 MG-UNIT CAPS Take 1 capsule by mouth daily.    [provider]  hydrALAZINE (APRESOLINE) 25 MG tablet Take 25 mg by mouth 3 (three) times daily.    [provider]  Olmesartan Medoxomil (BENICAR PO) Take by mouth.    [provider]  omeprazole (PRILOSEC) 40 MG capsule Take 40 mg by mouth daily.  07/15/15   [provider]    Family History Family History  Problem Relation Age of Onset  . Thyroid disease Mother   . Colon cancer Neg Hx     Social History Social History   Tobacco Use  . Smoking status: Former Smoker    Packs/day: 0.50    Years: 35.00  Pack years: 17.50    Types: Cigarettes    Last attempt to quit: 12/10/1994    Years since quitting: 22.9  . Smokeless tobacco: Never Used  Substance Use Topics  . Alcohol use: Yes    Alcohol/week: 3.0 standard drinks    Types: 3 Glasses of wine per week  . Drug use: No     Allergies   Patient has no known allergies.   Review of Systems Review of Systems ROS: Statement: All systems negative except as marked or noted in the HPI; Constitutional: Negative for fever and chills. ; ; Eyes: Negative for eye pain, redness and discharge. ; ; ENMT: Negative for ear pain, hoarseness, nasal congestion, sinus pressure and sore throat. ; ; Cardiovascular: Negative for chest pain, palpitations, diaphoresis, dyspnea and peripheral edema. ; ; Respiratory: Negative for cough, wheezing and stridor. ; ; Gastrointestinal: Negative for nausea, vomiting, diarrhea, abdominal pain, blood in stool, hematemesis, jaundice and rectal  bleeding. . ; ; Genitourinary: Negative for dysuria, flank pain and hematuria. ; ; Musculoskeletal: Negative for back pain and neck pain. Negative for swelling and trauma.; ; Skin: Negative for pruritus, rash, abrasions, blisters, bruising and skin lesion.; ; Neuro: Negative for headache, lightheadedness and neck stiffness. Negative for extremity weakness, paresthesias, involuntary movement, seizure and +generalized weakness, syncope.       Physical Exam Updated Vital Signs BP (!) 111/49 (BP Location: Left Arm)   Pulse (!) 54   Temp 97.9 F (36.6 C) (Oral)   SpO2 99%    Patient Vitals for the past 24 hrs:  BP Temp Temp src Pulse Resp SpO2  11/02/17 1916 132/61 - - 66 18 98 %  11/02/17 1841 (!) 144/64 - - 68 - 99 %  11/02/17 1840 129/69 - - 62 - 99 %  11/02/17 1839 - - - 63 - 99 %  11/02/17 1838 (!) 145/50 - - 62 - 100 %  11/02/17 1730 (!) 107/47 - - (!) 54 15 98 %  11/02/17 1645 (!) 111/49 97.9 F (36.6 C) Oral (!) 54 - 99 %     18:38 Orthostatic Vital Signs LA  Orthostatic Lying   BP- Lying: 145/50   Pulse- Lying: 64       Orthostatic Sitting  BP- Sitting: 129/69   Pulse- Sitting: 71       Orthostatic Standing at 0 minutes  BP- Standing at 0 minutes: 144/64   Pulse- Standing at 0 minutes: 66      Physical Exam 1700: Physical examination:  Nursing notes reviewed; Vital signs and O2 SAT reviewed;  Constitutional: Well developed, Well nourished, Well hydrated, In no acute distress; Head:  Normocephalic, atraumatic; Eyes: EOMI, PERRL, No scleral icterus; ENMT: Mouth and pharynx normal, Mucous membranes moist; Neck: Supple, Full range of motion, No lymphadenopathy; Cardiovascular: Regular rate and rhythm, No gallop; Respiratory: Breath sounds clear & equal bilaterally, No wheezes.  Speaking full sentences with ease, Normal respiratory effort/excursion; Chest: Nontender, Movement normal; Abdomen: Soft, Nontender, Nondistended, Normal bowel sounds; Genitourinary: No CVA  tenderness; Extremities: Peripheral pulses normal, No tenderness, No edema, No calf edema or asymmetry.; Neuro: AA&Ox3, Major CN grossly intact. Speech slurred.  No facial droop.  No nystagmus. Grips equal. Strength 5/5 equal bilat UE's and LE's.  DTR 2/4 equal bilat UE's and LE's.  No gross sensory deficits.  Normal cerebellar testing bilat UE's (finger-nose) and LE's (heel-shin).; Skin: Color normal, Warm, Dry.   ED Treatments / Results  Labs (all labs ordered are listed, but only  abnormal results are displayed)   EKG EKG Interpretation  Date/Time:  Friday November 02 2017 16:46:59 EDT Ventricular Rate:  50 PR Interval:    QRS Duration: 100 QT Interval:  469 QTC Calculation: 428 R Axis:   -10 Text Interpretation:  Sinus rhythm Low voltage, precordial leads Abnormal R-wave progression, early transition When compared with ECG of 02/06/2015 No significant change was found Confirmed by Francine Graven [phone]) on 11/02/2017 5:11:32 PM   Radiology   Procedures Procedures (including critical care time)  Medications Ordered in ED Medications - No data to display   Initial Impression / Assessment and Plan / ED Course  I have reviewed the triage vital signs and the nursing notes.  Pertinent labs & imaging results that were available during my care of the patient were reviewed by me and considered in my medical decision making (see chart for details).  MDM Reviewed: previous chart, nursing note and vitals Reviewed previous: labs and ECG Interpretation: labs, ECG, x-ray and MRI   Results for orders placed or performed during the hospital encounter of 11/02/17  Urinalysis, Routine w reflex microscopic  Result Value Ref Range   Color, Urine YELLOW YELLOW   APPearance CLEAR CLEAR   Specific Gravity, Urine 1.015 1.005 - 1.030   pH 7.0 5.0 - 8.0   Glucose, UA NEGATIVE NEGATIVE mg/dL   Hgb urine dipstick NEGATIVE NEGATIVE   Bilirubin Urine NEGATIVE NEGATIVE   Ketones, ur  NEGATIVE NEGATIVE mg/dL   Protein, ur NEGATIVE NEGATIVE mg/dL   Nitrite NEGATIVE NEGATIVE   Leukocytes, UA NEGATIVE NEGATIVE  Troponin I  Result Value Ref Range   Troponin I <0.03 <0.03 ng/mL  Comprehensive metabolic panel  Result Value Ref Range   Sodium 132 (L) 135 - 145 mmol/L   Potassium 3.1 (L) 3.5 - 5.1 mmol/L   Chloride 99 98 - 111 mmol/L   CO2 26 22 - 32 mmol/L   Glucose, Bld 105 (H) 70 - 99 mg/dL   BUN 19 8 - 23 mg/dL   Creatinine, Ser 1.28 (H) 0.61 - 1.24 mg/dL   Calcium 8.7 (L) 8.9 - 10.3 mg/dL   Total Protein 6.9 6.5 - 8.1 g/dL   Albumin 3.7 3.5 - 5.0 g/dL   AST 23 15 - 41 U/L   ALT 20 0 - 44 U/L   Alkaline Phosphatase 37 (L) 38 - 126 U/L   Total Bilirubin 0.6 0.3 - 1.2 mg/dL   GFR calc non Af Amer 51 (L) >60 mL/min   GFR calc Af Amer 59 (L) >60 mL/min   Anion gap 7 5 - 15  CBC with Differential  Result Value Ref Range   WBC 11.6 (H) 4.0 - 10.5 K/uL   RBC 4.66 4.22 - 5.81 MIL/uL   Hemoglobin 13.2 13.0 - 17.0 g/dL   HCT 40.0 39.0 - 52.0 %   MCV 85.8 78.0 - 100.0 fL   MCH 28.3 26.0 - 34.0 pg   MCHC 33.0 30.0 - 36.0 g/dL   RDW 13.6 11.5 - 15.5 %   Platelets 222 150 - 400 K/uL   Neutrophils Relative % 38 %   Neutro Abs 4.4 1.7 - 7.7 K/uL   Lymphocytes Relative 48 %   Lymphs Abs 5.6 (H) 0.7 - 4.0 K/uL   Monocytes Relative 10 %   Monocytes Absolute 1.1 (H) 0.1 - 1.0 K/uL   Eosinophils Relative 4 %   Eosinophils Absolute 0.5 0.0 - 0.7 K/uL   Basophils Relative 0 %   Basophils Absolute  0.0 0.0 - 0.1 K/uL  CBG monitoring, ED  Result Value Ref Range   Glucose-Capillary 110 (H) 70 - 99 mg/dL   Dg Chest 2 View Result Date: 11/02/2017 CLINICAL DATA:  Syncopal episode lasting 6-7 minutes. EXAM: CHEST - 2 VIEW COMPARISON:  CXR 05/26/2014 FINDINGS: Stable cardiomegaly with moderate thoracic aortic atherosclerosis. Lungs are clear. No acute osseous abnormality. Mild degenerative change along the dorsal spine. IMPRESSION: Cardiomegaly with aortic atherosclerosis. No  active pulmonary disease. Electronically Signed   By: Ashley Royalty M.D.   On: 11/02/2017 18:45   Mr Brain Wo Contrast (neuro Protocol) Result Date: 11/02/2017 CLINICAL DATA:  7 minutes loss of consciousness. History of hypertension, hyperlipidemia and stroke. EXAM: MRI HEAD WITHOUT CONTRAST TECHNIQUE: Multiplanar, multiecho pulse sequences of the brain and surrounding structures were obtained without intravenous contrast. COMPARISON:  MRI head May 24, 2014. FINDINGS: INTRACRANIAL CONTENTS: No reduced diffusion to suggest acute ischemia. No susceptibility artifact to suggest hemorrhage. LEFT frontal lobe encephalomalacia with cystic changes, ex vacuo dilatation LEFT lateral ventricle. Old bilateral basal ganglia infarcts. Old bilateral small cerebellar infarcts. Patchy to confluent white matter FLAIR T2 hyperintensities. Moderate to severe parenchymal brain volume loss. Mild lobulated ventricular margin and narrowed callosum angle. No hydrocephalus. No midline shift, mass effect or masses. No abnormal extra-axial fluid collections. VASCULAR: Normal major intracranial vascular flow voids present at skull base. SKULL AND UPPER CERVICAL SPINE: No abnormal sellar expansion. No suspicious calvarial bone marrow signal. Craniocervical junction maintained. SINUSES/ORBITS: The mastoid air-cells and included paranasal sinuses are well-aerated.The included ocular globes and orbital contents are non-suspicious. OTHER: None. IMPRESSION: 1. No acute intracranial process. 2. Old LEFT frontal/MCA territory infarct. Old small supra and infratentorial infarcts. 3. Moderate to severe chronic small vessel ischemic changes. 4. Moderate to severe parenchymal brain volume loss with superimposed image findings of normal pressure hydrocephalus. Electronically Signed   By: Elon Alas M.D.   On: 11/02/2017 18:33    1930:  Not orthostatic on VS. BUN/Cr elevated from baseline; judicious IVF given. Potassium repleted PO; magnesium  level pending. T/C returned from [facility] Dr. Darrick Meigs, case discussed, including:  HPI, pertinent PM/SHx, VS/PE, dx testing, ED course and treatment:  Agreeable to admit.      Final Clinical Impressions(s) / ED Diagnoses   Final diagnoses:  None    ED Discharge Orders    None       Francine Graven, DO 11/03/17 1542

## 2017-11-03 ENCOUNTER — Observation Stay (HOSPITAL_COMMUNITY): Payer: Medicare Other

## 2017-11-03 DIAGNOSIS — R55 Syncope and collapse: Secondary | ICD-10-CM | POA: Diagnosis not present

## 2017-11-03 LAB — COMPREHENSIVE METABOLIC PANEL WITH GFR
ALT: 19 U/L (ref 0–44)
AST: 25 U/L (ref 15–41)
Albumin: 3.3 g/dL — ABNORMAL LOW (ref 3.5–5.0)
Alkaline Phosphatase: 35 U/L — ABNORMAL LOW (ref 38–126)
Anion gap: 5 (ref 5–15)
BUN: 14 mg/dL (ref 8–23)
CO2: 26 mmol/L (ref 22–32)
Calcium: 8.5 mg/dL — ABNORMAL LOW (ref 8.9–10.3)
Chloride: 104 mmol/L (ref 98–111)
Creatinine, Ser: 1.02 mg/dL (ref 0.61–1.24)
GFR calc Af Amer: >60 mL/min
GFR calc non Af Amer: >60 mL/min
Glucose, Bld: 105 mg/dL — ABNORMAL HIGH (ref 70–99)
Potassium: 4.2 mmol/L (ref 3.5–5.1)
Sodium: 135 mmol/L (ref 135–145)
Total Bilirubin: 0.8 mg/dL (ref 0.3–1.2)
Total Protein: 6.2 g/dL — ABNORMAL LOW (ref 6.5–8.1)

## 2017-11-03 LAB — CBC
HCT: 38.8 % — ABNORMAL LOW (ref 39.0–52.0)
Hemoglobin: 12.9 g/dL — ABNORMAL LOW (ref 13.0–17.0)
MCH: 28.5 pg (ref 26.0–34.0)
MCHC: 33.2 g/dL (ref 30.0–36.0)
MCV: 85.8 fL (ref 78.0–100.0)
Platelets: 210 K/uL (ref 150–400)
RBC: 4.52 MIL/uL (ref 4.22–5.81)
RDW: 13.6 % (ref 11.5–15.5)
WBC: 8.6 K/uL (ref 4.0–10.5)

## 2017-11-03 LAB — TROPONIN I
Troponin I: <0.03 ng/mL
Troponin I: <0.03 ng/mL

## 2017-11-03 LAB — ECHOCARDIOGRAM COMPLETE

## 2017-11-03 MED ORDER — ORAL CARE MOUTH RINSE
15.0000 mL | Freq: Two times a day (BID) | OROMUCOSAL | Status: DC
  Administered 2017-11-03: 15 mL via OROMUCOSAL

## 2017-11-03 MED ORDER — OMEGA-3-ACID ETHYL ESTERS 1 G PO CAPS: 1.0000 | ORAL_CAPSULE | Freq: Every day | ORAL | 1 refills | Status: DC

## 2017-11-03 NOTE — Progress Notes (Signed)
Patient discharged home today per MD orders. Patient vital signs WDL. IV removed and site WDL. Discharge Instructions including follow up appointments, medications, and education reviewed with patient. Patient verbalizes understanding. Patient is transported out via wheelchair.  

## 2017-11-03 NOTE — Progress Notes (Signed)
Echocardiogram 2D Echocardiogram has been performed.  Henry [name] 11/03/2017, 9:10 AM

## 2017-11-03 NOTE — Discharge Summary (Signed)
Physician Discharge Summary  Henry [name] RWE:[id number] DOB: 1934/03/27 DOA: 11/02/2017  PCP: Leanna Battles, MD  Admit date: 11/02/2017 Discharge date: 11/03/2017  Time spent: 45 minutes  Recommendations for Outpatient Follow-up:  -To be discharged home today. -Advised to follow-up with PCP as previously scheduled. -It appears his syncope has been chronic and recurrent.  May consider discontinuing Flomax as it is a well-known cause of orthostatic hypotension.  Discharge Diagnoses:  Active Problems:   Syncope   Discharge Condition: Stable and improved  Filed Weights   11/03/17 1006  Weight: 107.7 kg    History of present illness:  As per Dr. Darrick Meigs on 9/20: Henry [name]  is a 83 y.o. male, with history of hypertension, stroke, left carotid artery stents on antiplatelet therapy with Plavix, recurrent syncope was brought to hospital after patient had an episode of unresponsiveness while driving back from [place].  Patient was on the passenger side and his friend was driving and noticed that patient suddenly became quite and was not responding.  The episode lasted for a few minutes.  There was no reported seizure-like activity.  No bladder or bowel incontinence. Patient has previous history of CVA, MRI brain done in the ED shows no acute stroke. Patient has no focal neurological deficit.   Patient tells me that he has had multiple episodes of syncope in the past as he has been on diuretics for long time.  He says that he usually gets these episodes when he is  dehydrated.  His cardiologist Dr. Einar Gip is aware of patient's recurrent syncope.  His last echo was from 2016, which showed EF 65 to 70%, no wall motion abnormality, grade 1 diastolic dysfunction.  Denies nausea vomiting or diarrhea Denies dysuria urgency or frequency of urination. No focal weakness of extremities No chest pain or shortness of breath  Hospital Course:   Recurrent syncope -No arrhythmias on  telemetry, no reported seizure-like activity, no bowel or bladder incontinence. -MRI was done in the ED that showed no acute stroke. -Given the recurrent nature of his syncope do not believe that continued hospitalization and further inpatient testing would be of benefit.  Procedures:  None   Consultations:  None  Discharge Instructions  Discharge Instructions    Diet - low sodium heart healthy   Complete by:  As directed    Increase activity slowly   Complete by:  As directed      Allergies as of 11/03/2017   No Known Allergies     Medication List    STOP taking these medications   FISH OIL + D3 1000-1000 MG-UNIT Caps Replaced by:  omega-3 acid ethyl esters 1 g capsule     TAKE these medications   aspirin 81 MG tablet Take 81 mg by mouth at bedtime.   atenolol 50 MG tablet Commonly known as:  TENORMIN Take 50 mg by mouth every morning.   atorvastatin 40 MG tablet Commonly known as:  LIPITOR Take 1 tablet (40 mg total) by mouth every evening.   BENICAR 40 MG tablet Generic drug:  olmesartan Take 40 mg by mouth every morning.   chlorthalidone 25 MG tablet Commonly known as:  HYGROTON Take 25 mg by mouth every morning.   clopidogrel 75 MG tablet Commonly known as:  PLAVIX TAKE 1 TABLET DAILY What changed:  when to take this   finasteride 5 MG tablet Commonly known as:  PROSCAR Take 1 tablet (5 mg total) by mouth daily. What changed:  when to  take this   Fish Oil 1000 MG Caps Take 1,000 mg by mouth every morning.   hydrALAZINE 25 MG tablet Commonly known as:  APRESOLINE Take 25 mg by mouth 2 (two) times daily.   omega-3 acid ethyl esters 1 g capsule Commonly known as:  LOVAZA Take 1 capsule (1 g total) by mouth daily. Start taking on:  11/04/2017 Replaces:  FISH OIL + D3 1000-1000 MG-UNIT Caps   omeprazole 40 MG capsule Commonly known as:  PRILOSEC Take 40 mg by mouth every morning.   polyethylene glycol packet Commonly known as:  MIRALAX /  GLYCOLAX Take 17 g by mouth every morning.   VITAMIN D PO Take 1 capsule by mouth every evening.      No Known Allergies    The results of significant diagnostics from this hospitalization (including imaging, microbiology, ancillary and laboratory) are listed below for reference.    Significant Diagnostic Studies: Dg Chest 2 View  Result Date: 11/02/2017 CLINICAL DATA:  Syncopal episode lasting 6-7 minutes. EXAM: CHEST - 2 VIEW COMPARISON:  CXR 05/26/2014 FINDINGS: Stable cardiomegaly with moderate thoracic aortic atherosclerosis. Lungs are clear. No acute osseous abnormality. Mild degenerative change along the dorsal spine. IMPRESSION: Cardiomegaly with aortic atherosclerosis. No active pulmonary disease. Electronically Signed   By: Ashley Royalty M.D.   On: 11/02/2017 18:45   Mr Brain Wo Contrast (neuro Protocol)  Result Date: 11/02/2017 CLINICAL DATA:  7 minutes loss of consciousness. History of hypertension, hyperlipidemia and stroke. EXAM: MRI HEAD WITHOUT CONTRAST TECHNIQUE: Multiplanar, multiecho pulse sequences of the brain and surrounding structures were obtained without intravenous contrast. COMPARISON:  MRI head May 24, 2014. FINDINGS: INTRACRANIAL CONTENTS: No reduced diffusion to suggest acute ischemia. No susceptibility artifact to suggest hemorrhage. LEFT frontal lobe encephalomalacia with cystic changes, ex vacuo dilatation LEFT lateral ventricle. Old bilateral basal ganglia infarcts. Old bilateral small cerebellar infarcts. Patchy to confluent white matter FLAIR T2 hyperintensities. Moderate to severe parenchymal brain volume loss. Mild lobulated ventricular margin and narrowed callosum angle. No hydrocephalus. No midline shift, mass effect or masses. No abnormal extra-axial fluid collections. VASCULAR: Normal major intracranial vascular flow voids present at skull base. SKULL AND UPPER CERVICAL SPINE: No abnormal sellar expansion. No suspicious calvarial bone marrow signal.  Craniocervical junction maintained. SINUSES/ORBITS: The mastoid air-cells and included paranasal sinuses are well-aerated.The included ocular globes and orbital contents are non-suspicious. OTHER: None. IMPRESSION: 1. No acute intracranial process. 2. Old LEFT frontal/MCA territory infarct. Old small supra and infratentorial infarcts. 3. Moderate to severe chronic small vessel ischemic changes. 4. Moderate to severe parenchymal brain volume loss with superimposed image findings of normal pressure hydrocephalus. Electronically Signed   By: Elon Alas M.D.   On: 11/02/2017 18:33    Microbiology: No results found for this or any previous visit (from the past 240 hour(s)).   Labs: Basic Metabolic Panel: Recent Labs  Lab 11/02/17 1706 11/03/17 0414  NA 132* 135  K 3.1* 4.2  CL 99 104  CO2 26 26  GLUCOSE 105* 105*  BUN 19 14  CREATININE 1.28* 1.02  CALCIUM 8.7* 8.5*  MG 2.2  --    Liver Function Tests: Recent Labs  Lab 11/02/17 1706 11/03/17 0414  AST 23 25  ALT 20 19  ALKPHOS 37* 35*  BILITOT 0.6 0.8  PROT 6.9 6.2*  ALBUMIN 3.7 3.3*   No results for input(s): LIPASE, AMYLASE in the last 168 hours. No results for input(s): AMMONIA in the last 168 hours. CBC: Recent Labs  Lab 11/02/17 1706 11/03/17 0414  WBC 11.6* 8.6  NEUTROABS 4.4  --   HGB 13.2 12.9*  HCT 40.0 38.8*  MCV 85.8 85.8  PLT 222 210   Cardiac Enzymes: Recent Labs  Lab 11/02/17 1706 11/02/17 2142 11/03/17 0414 11/03/17 0907  TROPONINI <0.03 <0.03 <0.03 <0.03   BNP: BNP (last 3 results) No results for input(s): BNP in the last 8760 hours.  ProBNP (last 3 results) No results for input(s): PROBNP in the last 8760 hours.  CBG: Recent Labs  Lab 11/02/17 1649  GLUCAP 110*       Signed:  Lelon Frohlich  [facility] Pager: [phone] 11/03/2017, 11:32 AM

## 2017-11-07 ENCOUNTER — Ambulatory Visit (HOSPITAL_COMMUNITY)
Admission: RE | Admit: 2017-11-07 | Discharge: 2017-11-07 | Disposition: A | Discharge status: Home / Self Care | Payer: Medicare Other | Source: Ambulatory Visit | Attending: Interventional Radiology | Admitting: Interventional Radiology

## 2017-11-07 DIAGNOSIS — I771 Stricture of artery: Secondary | ICD-10-CM | POA: Diagnosis not present

## 2017-11-07 NOTE — Progress Notes (Signed)
*  PRELIMINARY RESULTS* Vascular Ultrasound Carotid Duplex (Doppler) has been completed.  Findings suggest 1-39% right internal carotid artery stenosis and <50% stenosis in the left internal carotid artery stent. Vertebral arteries are patent with antegrade flow.  11/07/2017 10:27 AM Maudry Mayhew, MHA, RVT, RDCS, RDMS

## 2017-11-12 ENCOUNTER — Telehealth (HOSPITAL_COMMUNITY): Payer: Self-pay

## 2017-11-12 NOTE — Telephone Encounter (Signed)
[name] agreed to f/u in 6 months with us carotid. AW 

## 2017-11-13 DIAGNOSIS — Z23 Encounter for immunization: Secondary | ICD-10-CM | POA: Diagnosis not present

## 2017-11-29 DIAGNOSIS — R55 Syncope and collapse: Secondary | ICD-10-CM | POA: Diagnosis not present

## 2017-11-29 DIAGNOSIS — Z6841 Body Mass Index (BMI) 40.0 and over, adult: Secondary | ICD-10-CM | POA: Diagnosis not present

## 2017-11-29 DIAGNOSIS — E876 Hypokalemia: Secondary | ICD-10-CM | POA: Diagnosis not present

## 2017-11-29 DIAGNOSIS — I5033 Acute on chronic diastolic (congestive) heart failure: Secondary | ICD-10-CM | POA: Diagnosis not present

## 2017-12-04 DIAGNOSIS — I5033 Acute on chronic diastolic (congestive) heart failure: Secondary | ICD-10-CM | POA: Diagnosis not present

## 2017-12-04 DIAGNOSIS — E8770 Fluid overload, unspecified: Secondary | ICD-10-CM | POA: Diagnosis not present

## 2017-12-04 DIAGNOSIS — R55 Syncope and collapse: Secondary | ICD-10-CM | POA: Diagnosis not present

## 2017-12-04 DIAGNOSIS — E876 Hypokalemia: Secondary | ICD-10-CM | POA: Diagnosis not present

## 2017-12-04 DIAGNOSIS — Z6841 Body Mass Index (BMI) 40.0 and over, adult: Secondary | ICD-10-CM | POA: Diagnosis not present

## 2017-12-04 DIAGNOSIS — I10 Essential (primary) hypertension: Secondary | ICD-10-CM | POA: Diagnosis not present

## 2017-12-04 DIAGNOSIS — G4733 Obstructive sleep apnea (adult) (pediatric): Secondary | ICD-10-CM | POA: Diagnosis not present

## 2017-12-10 DIAGNOSIS — I5033 Acute on chronic diastolic (congestive) heart failure: Secondary | ICD-10-CM | POA: Diagnosis not present

## 2017-12-10 DIAGNOSIS — Z6841 Body Mass Index (BMI) 40.0 and over, adult: Secondary | ICD-10-CM | POA: Diagnosis not present

## 2017-12-10 DIAGNOSIS — I10 Essential (primary) hypertension: Secondary | ICD-10-CM | POA: Diagnosis not present

## 2017-12-10 DIAGNOSIS — G4733 Obstructive sleep apnea (adult) (pediatric): Secondary | ICD-10-CM | POA: Diagnosis not present

## 2017-12-10 DIAGNOSIS — E8770 Fluid overload, unspecified: Secondary | ICD-10-CM | POA: Diagnosis not present

## 2017-12-18 DIAGNOSIS — E7849 Other hyperlipidemia: Secondary | ICD-10-CM | POA: Diagnosis not present

## 2017-12-18 DIAGNOSIS — Z125 Encounter for screening for malignant neoplasm of prostate: Secondary | ICD-10-CM | POA: Diagnosis not present

## 2017-12-18 DIAGNOSIS — I10 Essential (primary) hypertension: Secondary | ICD-10-CM | POA: Diagnosis not present

## 2017-12-25 DIAGNOSIS — Z23 Encounter for immunization: Secondary | ICD-10-CM | POA: Diagnosis not present

## 2017-12-25 DIAGNOSIS — I10 Essential (primary) hypertension: Secondary | ICD-10-CM | POA: Diagnosis not present

## 2017-12-25 DIAGNOSIS — Z8679 Personal history of other diseases of the circulatory system: Secondary | ICD-10-CM | POA: Diagnosis not present

## 2017-12-25 DIAGNOSIS — E7849 Other hyperlipidemia: Secondary | ICD-10-CM | POA: Diagnosis not present

## 2017-12-25 DIAGNOSIS — M545 Low back pain: Secondary | ICD-10-CM | POA: Diagnosis not present

## 2017-12-25 DIAGNOSIS — Z1389 Encounter for screening for other disorder: Secondary | ICD-10-CM | POA: Diagnosis not present

## 2017-12-25 DIAGNOSIS — R42 Dizziness and giddiness: Secondary | ICD-10-CM | POA: Diagnosis not present

## 2017-12-25 DIAGNOSIS — R55 Syncope and collapse: Secondary | ICD-10-CM | POA: Diagnosis not present

## 2017-12-25 DIAGNOSIS — G4733 Obstructive sleep apnea (adult) (pediatric): Secondary | ICD-10-CM | POA: Diagnosis not present

## 2017-12-25 DIAGNOSIS — Z0000 Encounter for general adult medical examination without abnormal findings: Secondary | ICD-10-CM | POA: Diagnosis not present

## 2017-12-25 DIAGNOSIS — Z6838 Body mass index (BMI) 38.0-38.9, adult: Secondary | ICD-10-CM | POA: Diagnosis not present

## 2017-12-25 DIAGNOSIS — K5909 Other constipation: Secondary | ICD-10-CM | POA: Diagnosis not present

## 2018-01-08 DIAGNOSIS — I10 Essential (primary) hypertension: Secondary | ICD-10-CM | POA: Diagnosis not present

## 2018-01-09 DIAGNOSIS — I10 Essential (primary) hypertension: Secondary | ICD-10-CM | POA: Diagnosis not present

## 2018-01-25 DIAGNOSIS — R55 Syncope and collapse: Secondary | ICD-10-CM | POA: Diagnosis not present

## 2018-01-25 DIAGNOSIS — I951 Orthostatic hypotension: Secondary | ICD-10-CM | POA: Diagnosis not present

## 2018-01-25 DIAGNOSIS — I350 Nonrheumatic aortic (valve) stenosis: Secondary | ICD-10-CM | POA: Diagnosis not present

## 2018-01-25 DIAGNOSIS — I10 Essential (primary) hypertension: Secondary | ICD-10-CM | POA: Diagnosis not present

## 2018-02-04 DIAGNOSIS — E876 Hypokalemia: Secondary | ICD-10-CM | POA: Diagnosis not present

## 2018-02-08 DIAGNOSIS — M9905 Segmental and somatic dysfunction of pelvic region: Secondary | ICD-10-CM | POA: Diagnosis not present

## 2018-02-08 DIAGNOSIS — M545 Low back pain: Secondary | ICD-10-CM | POA: Diagnosis not present

## 2018-02-08 DIAGNOSIS — M9902 Segmental and somatic dysfunction of thoracic region: Secondary | ICD-10-CM | POA: Diagnosis not present

## 2018-02-08 DIAGNOSIS — M546 Pain in thoracic spine: Secondary | ICD-10-CM | POA: Diagnosis not present

## 2018-02-08 DIAGNOSIS — M9903 Segmental and somatic dysfunction of lumbar region: Secondary | ICD-10-CM | POA: Diagnosis not present

## 2018-02-11 ENCOUNTER — Encounter: Payer: Self-pay | Admitting: Nurse Practitioner

## 2018-03-22 DIAGNOSIS — E876 Hypokalemia: Secondary | ICD-10-CM | POA: Diagnosis not present

## 2018-04-08 DIAGNOSIS — E668 Other obesity: Secondary | ICD-10-CM | POA: Diagnosis not present

## 2018-04-08 DIAGNOSIS — I10 Essential (primary) hypertension: Secondary | ICD-10-CM | POA: Diagnosis not present

## 2018-04-08 DIAGNOSIS — R42 Dizziness and giddiness: Secondary | ICD-10-CM | POA: Diagnosis not present

## 2018-04-08 DIAGNOSIS — G4733 Obstructive sleep apnea (adult) (pediatric): Secondary | ICD-10-CM | POA: Diagnosis not present

## 2018-04-08 DIAGNOSIS — Z6839 Body mass index (BMI) 39.0-39.9, adult: Secondary | ICD-10-CM | POA: Diagnosis not present

## 2018-04-08 DIAGNOSIS — E7849 Other hyperlipidemia: Secondary | ICD-10-CM | POA: Diagnosis not present

## 2018-04-08 DIAGNOSIS — Z8679 Personal history of other diseases of the circulatory system: Secondary | ICD-10-CM | POA: Diagnosis not present

## 2018-04-15 DIAGNOSIS — M9902 Segmental and somatic dysfunction of thoracic region: Secondary | ICD-10-CM | POA: Diagnosis not present

## 2018-04-15 DIAGNOSIS — M545 Low back pain: Secondary | ICD-10-CM | POA: Diagnosis not present

## 2018-04-15 DIAGNOSIS — M9903 Segmental and somatic dysfunction of lumbar region: Secondary | ICD-10-CM | POA: Diagnosis not present

## 2018-04-15 DIAGNOSIS — M9905 Segmental and somatic dysfunction of pelvic region: Secondary | ICD-10-CM | POA: Diagnosis not present

## 2018-04-15 DIAGNOSIS — M546 Pain in thoracic spine: Secondary | ICD-10-CM | POA: Diagnosis not present

## 2018-04-27 ENCOUNTER — Other Ambulatory Visit: Payer: Self-pay | Admitting: Cardiology

## 2018-06-12 ENCOUNTER — Telehealth (HOSPITAL_COMMUNITY): Payer: Self-pay

## 2018-06-12 ENCOUNTER — Other Ambulatory Visit (HOSPITAL_COMMUNITY): Payer: Self-pay | Admitting: Interventional Radiology

## 2018-06-12 DIAGNOSIS — I771 Stricture of artery: Secondary | ICD-10-CM

## 2018-06-12 NOTE — Telephone Encounter (Signed)
Called to schedule ultrasound carotid, no answer, left vm. AW

## 2018-07-22 ENCOUNTER — Ambulatory Visit (HOSPITAL_COMMUNITY)
Admission: RE | Admit: 2018-07-22 | Discharge: 2018-07-22 | Disposition: A | Discharge status: Home / Self Care | Payer: Medicare Other | Source: Ambulatory Visit | Attending: Interventional Radiology | Admitting: Interventional Radiology

## 2018-07-22 ENCOUNTER — Other Ambulatory Visit: Payer: Self-pay

## 2018-07-22 DIAGNOSIS — I771 Stricture of artery: Secondary | ICD-10-CM | POA: Insufficient documentation

## 2018-07-22 DIAGNOSIS — E876 Hypokalemia: Secondary | ICD-10-CM | POA: Diagnosis not present

## 2018-07-22 NOTE — Progress Notes (Signed)
Carotid artery duplex has been completed. Preliminary results can be found in CV Proc through chart review.   07/22/18 11:13 AM Henry [name] RVT

## 2018-07-28 ENCOUNTER — Encounter: Payer: Self-pay | Admitting: Adult Health

## 2018-07-29 ENCOUNTER — Ambulatory Visit: Payer: Medicare Other | Admitting: Nurse Practitioner

## 2018-07-29 ENCOUNTER — Telehealth: Payer: Self-pay | Admitting: *Deleted

## 2018-07-29 ENCOUNTER — Telehealth (HOSPITAL_COMMUNITY): Payer: Self-pay

## 2018-07-29 NOTE — Telephone Encounter (Signed)
Left message for pt to f/u in 6 months, no answer, left vm. AW

## 2018-07-29 NOTE — Telephone Encounter (Signed)
Due to current COVID 19 pandemic, our office is severely reducing in office visits until further notice, in order to minimize the risk to our patients and healthcare providers.  Pt understands that although there may be some limitations with this type of visit, we will take all precautions to reduce any security or privacy concerns.  Pt understands that this will be treated like an in office visit and we will file with pt's insurance, and there may be a patient responsible charge related to this service. Consented to mychart VV.

## 2018-07-31 ENCOUNTER — Encounter: Payer: Self-pay | Admitting: Adult Health

## 2018-07-31 ENCOUNTER — Telehealth (INDEPENDENT_AMBULATORY_CARE_PROVIDER_SITE_OTHER): Payer: Medicare Other | Admitting: Adult Health

## 2018-07-31 DIAGNOSIS — G4733 Obstructive sleep apnea (adult) (pediatric): Secondary | ICD-10-CM | POA: Diagnosis not present

## 2018-07-31 DIAGNOSIS — Z9989 Dependence on other enabling machines and devices: Secondary | ICD-10-CM | POA: Diagnosis not present

## 2018-07-31 NOTE — Progress Notes (Addendum)
  [facility] [address] [place]. [place] [address] [phone]     Virtual Visit via Telephone Note  I connected with Henry [name] on 07/31/18 at 10:30 AM EDT by telephone located remotely at [facility] and verified that I am speaking with the correct person using two identifiers who reports being located at home.    Visit scheduled by Ward Givens NP-C. She discussed the limitations, risks, security and privacy concerns of performing an evaluation and management service by telephone and the availability of in person appointments. I also discussed with the patient that there may be a patient responsible charge related to this service. The patient expressed understanding and agreed to proceed.   Patient tried to complete a video visit however he cannot access the platform   History of Present Illness:  Henry [name] is a 84 y.o. male who has been followed in this office for obstructive sleep apnea on CPAP.  He was initially scheduled for face-to-face office follow up visit today time but due to [place], visit rescheduled for non-face-to-face telephone visit with patients consent. Unable to participate in video visit due to lack of access to device with camera.     Today 07/31/18  Henry [name] is an 84 year old male with a history of obstructive sleep apnea on CPAP.  His download indicates that he uses machine nightly and greater than 4 hours each night.  On average he uses his machine 8 hours and 19 minutes.  His residual AHI is 6.2 on 8 cm of water with EPR of 3.  He reports that he is tolerating the machine well.  He does not feel the mask leaking at night.  Denies any new issues.   Observations/Objective:    Neurological examination  Mentation: Alert oriented to time, place, history taking.  speech and language fluent  Assessment and Plan:  1.  Obstructive sleep apnea on CPAP  The patient's CPAP download shows excellent  compliance but slightly elevated treatment of his AHI, I will increase his pressure to 9 cm of water.  He is encouraged to continue using the CPAP nightly and greater than 4 hours each night.Marland Kitchen  He is advised that if his symptoms worsen or he develops new symptoms he should let us know.  He will follow-up in 1 year or sooner if needed.  Follow Up Instructions:  FU 1 year  I discussed the assessment and treatment plan with the patient.  The patient was provided an opportunity to ask questions and all were answered to their satisfaction. The patient agreed with the plan and verbalized an understanding of the instructions.   I provided 10 minutes of non-face-to-face time during this encounter.    Ward Givens, MSN, NP-C 07/31/2018, 10:50 AM [facility] [address], [place], [place] [address] [phone]  I reviewed the above note and documentation by the Nurse Practitioner and agree with the history, exam, assessment and plan as outlined above. I was immediately available for consultation. Star Age, MD, PhD [facility])

## 2018-08-12 DIAGNOSIS — M9902 Segmental and somatic dysfunction of thoracic region: Secondary | ICD-10-CM | POA: Diagnosis not present

## 2018-08-12 DIAGNOSIS — M9905 Segmental and somatic dysfunction of pelvic region: Secondary | ICD-10-CM | POA: Diagnosis not present

## 2018-08-12 DIAGNOSIS — M546 Pain in thoracic spine: Secondary | ICD-10-CM | POA: Diagnosis not present

## 2018-08-12 DIAGNOSIS — M9903 Segmental and somatic dysfunction of lumbar region: Secondary | ICD-10-CM | POA: Diagnosis not present

## 2018-08-12 DIAGNOSIS — M545 Low back pain: Secondary | ICD-10-CM | POA: Diagnosis not present

## 2018-08-13 ENCOUNTER — Other Ambulatory Visit: Payer: Self-pay | Admitting: Cardiology

## 2018-08-13 NOTE — Telephone Encounter (Signed)
Please fill

## 2018-10-07 DIAGNOSIS — H35033 Hypertensive retinopathy, bilateral: Secondary | ICD-10-CM | POA: Diagnosis not present

## 2018-10-11 DIAGNOSIS — M546 Pain in thoracic spine: Secondary | ICD-10-CM | POA: Diagnosis not present

## 2018-10-11 DIAGNOSIS — M9903 Segmental and somatic dysfunction of lumbar region: Secondary | ICD-10-CM | POA: Diagnosis not present

## 2018-10-11 DIAGNOSIS — M9905 Segmental and somatic dysfunction of pelvic region: Secondary | ICD-10-CM | POA: Diagnosis not present

## 2018-10-11 DIAGNOSIS — M9902 Segmental and somatic dysfunction of thoracic region: Secondary | ICD-10-CM | POA: Diagnosis not present

## 2018-10-11 DIAGNOSIS — M545 Low back pain: Secondary | ICD-10-CM | POA: Diagnosis not present

## 2018-11-04 DIAGNOSIS — M9902 Segmental and somatic dysfunction of thoracic region: Secondary | ICD-10-CM | POA: Diagnosis not present

## 2018-11-04 DIAGNOSIS — M9903 Segmental and somatic dysfunction of lumbar region: Secondary | ICD-10-CM | POA: Diagnosis not present

## 2018-11-04 DIAGNOSIS — M546 Pain in thoracic spine: Secondary | ICD-10-CM | POA: Diagnosis not present

## 2018-11-04 DIAGNOSIS — M545 Low back pain: Secondary | ICD-10-CM | POA: Diagnosis not present

## 2018-11-04 DIAGNOSIS — M9905 Segmental and somatic dysfunction of pelvic region: Secondary | ICD-10-CM | POA: Diagnosis not present

## 2018-11-05 DIAGNOSIS — S335XXA Sprain of ligaments of lumbar spine, initial encounter: Secondary | ICD-10-CM | POA: Diagnosis not present

## 2018-11-12 ENCOUNTER — Ambulatory Visit (HOSPITAL_COMMUNITY): Payer: Medicare Other | Attending: Sports Medicine | Admitting: Physical Therapy

## 2018-11-12 ENCOUNTER — Encounter (HOSPITAL_COMMUNITY): Payer: Self-pay | Admitting: Physical Therapy

## 2018-11-12 ENCOUNTER — Other Ambulatory Visit: Payer: Self-pay

## 2018-11-12 DIAGNOSIS — R29898 Other symptoms and signs involving the musculoskeletal system: Secondary | ICD-10-CM | POA: Insufficient documentation

## 2018-11-12 DIAGNOSIS — M545 Low back pain, unspecified: Secondary | ICD-10-CM

## 2018-11-12 NOTE — Patient Instructions (Signed)
HIP: Hamstrings - Short Sitting    Rest leg on raised surface. Keep knee straight. Lift chest. Hold _30__ seconds. _3__ reps per set, __1-2_ sets per day, _7__ days per week  Copyright  VHI. All rights reserved.

## 2018-11-12 NOTE — Therapy (Signed)
[name], [place], [address] Phone: [phone]   Fax:  [phone]  Physical Therapy Evaluation  Patient Details  Name: Henry [name] MRN: [id number] Date of Birth: Oct 19, 1934 Referring Provider (PT): Berle Mull, MD   Encounter Date: 11/12/2018  PT End of Session - 11/12/18 1551    Visit Number  1    Number of Visits  8    Date for PT Re-Evaluation  12/10/18    Authorization Type  Medicare; [employer]: secondary    Authorization Time Period  11/12/18 - 12/10/18    Authorization - Visit Number  1    Authorization - Number of Visits  10    PT Start Time  [id number]    PT Stop Time  1518    PT Time Calculation (min)  43 min    Activity Tolerance  Patient tolerated treatment well    Behavior During Therapy  [facility] for tasks assessed/performed       Past Medical History:  Diagnosis Date  . Accelerated hypertension 06/29/2014  . Arthritis    Back  . Chronic kidney disease   . Dizziness and giddiness   . Enlarged prostate   . Esophagitis 05/10/2011  . Foley catheter in place   . FOOT SPRAIN, RIGHT 07/18/2007   Qualifier: Diagnosis of  By: Aline Brochure MD, [name]    . GERD (gastroesophageal reflux disease)   . HOH (hard of hearing)   . Hx-TIA (transient ischemic attack)    hx of this- wife states 10 years ago-but Dr. Estil Daft told wife he had had several-has 2 stents in carotid artery-left  . Hypercholesteremia   . Hypertension   . Low back pain   . Nocturia   . Stroke [facility])    had a stroke 05/23/2014  . Syncope and collapse     Past Surgical History:  Procedure Laterality Date  . carotid stents  05/2014   2 stents in carotid artery on left side  . COLONOSCOPY    . COLONOSCOPY  05/17/2011   Procedure: COLONOSCOPY;  Surgeon: [name], MD;  [facility];  Service: Endoscopy;  Laterality: N/A;  830  . epidural steroid injection  05/22/2014   had this in lumbar and at 1 am on 05/23/2014 had a stroke   . ESOPHAGOGASTRODUODENOSCOPY  10/28/2010   Procedure: ESOPHAGOGASTRODUODENOSCOPY (EGD);  Surgeon: [name], MD;  [facility];  Service: Endoscopy;  Laterality: N/A;  10:30  . ESOPHAGOGASTRODUODENOSCOPY N/A 04/24/2014   Procedure: ESOPHAGOGASTRODUODENOSCOPY (EGD);  Surgeon: [name], MD;  [facility];  Service: Endoscopy;  Laterality: N/A;  815  . ESOPHAGOGASTRODUODENOSCOPY N/A 06/26/2014   Procedure: ESOPHAGOGASTRODUODENOSCOPY (EGD);  Surgeon: [name], MD;  [facility];  Service: Endoscopy;  Laterality: N/A;  . EUS N/A 06/26/2014   Procedure: FULL UPPER ENDOSCOPIC ULTRASOUND (EUS) RADIAL;  Surgeon: [name], MD;  Location: [facility];  Service: Endoscopy;  Laterality: N/A;  . IR GENERIC HISTORICAL  10/12/2015   IR ANGIO VERTEBRAL SEL VERTEBRAL BILAT MOD SED 10/12/2015 Luanne Bras, MD [facility]  . IR GENERIC HISTORICAL  10/12/2015   IR ANGIO INTRA EXTRACRAN SEL COM CAROTID INNOMINATE BILAT MOD SED 10/12/2015 Luanne Bras, MD [facility]  . RADIOLOGY WITH ANESTHESIA N/A 05/23/2014   Procedure: RADIOLOGY WITH ANESTHESIA;  Surgeon: Medication Radiologist, MD;  Location: [facility];  Service: Radiology;  Laterality: N/A;  . RESECTION DISTAL CLAVICAL Right 12/13/2012   Procedure: RESECTION DISTAL CLAVICAL;  Surgeon: Carole Civil, MD;  Location: [facility];  Service: Orthopedics;  Laterality: Right;  . SHOULDER ARTHROSCOPY WITH BICEPSTENOTOMY Right 12/13/2012   Procedure: SHOULDER ARTHROSCOPY WITH BICEPS TENOTOMY, limited shoulder debridement;  Surgeon: Carole Civil, MD;  Location: [facility];  Service: Orthopedics;  Laterality: Right;  . SHOULDER SURGERY Right    torn cartiledge  . SHOULDER SURGERY     right shoulder  . TRANSURETHRAL RESECTION OF PROSTATE N/A 07/09/2014   Procedure: TRANSURETHRAL RESECTION of prostate WITH BUTTON ;  Surgeon: [name], MD;  Location: [facility];  Service: Urology;  Laterality: N/A;    There were no  vitals filed for this visit.   Subjective Assessment - 11/12/18 1442    Subjective  Patient reported having a stroke 3.5 years ago reported the main deficits he has is that he can't write as well and that his balance is decreased. Patient reported  that he is coming to therapy now because he was pushing a lawnmower and reported that he injured his lower back about 4 days ago. Patient reported that all of the pain is in his lower back. He reported that he has had issues with his back in the past. Patient denied any tingling or numbness. Patient denied any changes in his bowel and bladder function. Patient reported that when he is sitting his back is fine, but as soon as he stands up his back starts to hurt. He reported that he usually works out 3 days a week, but he hasn't been able to do this since he injured his back. He reported he used to walk 2 miles 3 days a week. He reported that the maximum his pain has been is a 10/10.    Pertinent History  Hx of CVA    Limitations  Standing;Walking    How long can you sit comfortably?  No issues    How long can you stand comfortably?  Hurts immediately    How long can you walk comfortably?  1 minute    Diagnostic tests  Patient reported having an x-ray    Patient Stated Goals  Be able to walk and do exercises    Currently in Pain?  No/denies         [facility] PT Assessment - 11/12/18 0001      Assessment   Medical Diagnosis  Lumbar Spondylosis, Leg strain    Referring Provider (PT)  Berle Mull, MD    Onset Date/Surgical Date  11/08/18    Next MD Visit  None    Prior Therapy  Yes, following stroke      Precautions   Precautions  None      Restrictions   Weight Bearing Restrictions  No      Balance Screen   Has the patient fallen in the past 6 months  No    Has the patient had a decrease in activity level because of a fear of falling?   No    Is the patient reluctant to leave their home because of a fear of falling?   No      Home  Environment   Living Environment  Private residence    Living Arrangements  Spouse/significant other   wife   Type of [place]  One level      Prior Function   Level of Independence  Independent;Independent with basic ADLs    Vocation  Retired      Associate Professor   Overall  Cognitive Status  Within Functional Limits for tasks assessed      Observation/Other Assessments   Observations  Bump noted in thoracic spine, soft to touch    Focus on Therapeutic Outcomes (FOTO)   46% limited      Sensation   Light Touch  Appears Intact      Posture/Postural Control   Posture/Postural Control  Postural limitations    Postural Limitations  Increased thoracic kyphosis      ROM / Strength   AROM / PROM / Strength  Strength;AROM      AROM   AROM Assessment Site  Lumbar    Lumbar Flexion  75% limited; painful    Lumbar Extension  50% limited; no pain    Lumbar - Right Side Bend  WFL; slight pain    Lumbar - Left Side Bend  25% limited; slight pain     Lumbar - Right Rotation  WFL; painful    Lumbar - Left Rotation  WFL; no pain      Strength   Strength Assessment Site  Hip;Knee;Ankle    Right/Left Hip  Right;Left    Right Hip Flexion  4/5    Right Hip Extension  4+/5    Right Hip ABduction  5/5    Left Hip Flexion  4+/5    Left Hip Extension  4+/5    Left Hip ABduction  5/5    Right/Left Knee  Right;Left    Right Knee Flexion  5/5    Right Knee Extension  5/5    Left Knee Flexion  5/5    Left Knee Extension  5/5    Right/Left Ankle  Right;Left    Right Ankle Dorsiflexion  5/5    Left Ankle Dorsiflexion  5/5      Flexibility   Soft Tissue Assessment /Muscle Length  yes    Hamstrings  50% limited      Palpation   Spinal mobility  General hypomobility thoracic and lumbar spine    Palpation comment  Tenderness to palpation recreating patient's pain of left PSIS and SI joint. Pain with palpation of right thoracic paraspinals.       Ambulation/Gait    Ambulation/Gait  Yes    Ambulation Distance (Feet)  400 Feet    Assistive device  None    Gait Pattern  --   Decreased trunk rotation, feet externally rotated   Ambulation Surface  Level;Indoor      Balance   Balance Assessed  Yes      Static Standing Balance   Static Standing - Balance Support  No upper extremity supported    Static Standing Balance -  Activities   Single Leg Stance - Right Leg;Single Leg Stance - Left Leg    Static Standing - Comment/# of Minutes  2 seconds each LE                Objective measurements completed on examination: See above findings.              PT Education - 11/12/18 1550    Education Details  Discussed examination findings, POC, and initial HEP.    Person(s) Educated  Patient    Methods  Explanation;Handout    Comprehension  Verbalized understanding       PT Short Term Goals - 11/12/18 1552      PT SHORT TERM GOAL #1   Title  Patient will report understanding and regular compliance with HEP to improve mobility, flexibility, and decrease  pain.    Time  2    Period  Weeks    Status  New    Target Date  11/26/18        PT Long Term Goals - 11/12/18 1552      PT LONG TERM GOAL #1   Title  Patient will report that his back pain has not exceeded a 3/10 over the course of a 1 week period indicating improved tolerance to daily activities.    Time  4    Period  Weeks    Status  New    Target Date  12/10/18      PT LONG TERM GOAL #2   Title  Patient will report ability to ambulate for at least 20 minutes without onset of pain in order to return to walking program and ability to perform grocery shopping    Time  4    Period  Weeks    Status  New    Target Date  12/10/18      PT LONG TERM GOAL #3   Title  Patient will perform all lumbar AROM with minimal to no pain, indicating decreased irritability of pain for improved tolerance to mobility.    Time  4    Period  Weeks    Status  New    Target Date  12/10/18              Plan - 11/12/18 1740    Clinical Impression Statement  Patient is an 84 year old male who presented to outpatient physical therapy with primary complaint of acute low back pain with a history of recurring issues with his low back. Upon examination, patient demonstrated limitations in lumbar spine ROM as well as increased pain with lumbar AROM. Patient demonstrated some decreased strength in bilateral hips. Patient's pain was provoked with palpation of left PSIS and left SI as well as with right lumbar paraspinals. Patient demonstrated decreased hamstring flexibility bilaterally. Decreased spinal mobility in general as well as decreased trunk rotation when ambulating. Patient demonstrated decreased stability on bilateral lower extremities which patient reported was due to his history of a CVA. Did note a bump near patient's thoracic spine which was soft to the touch, like a deposit of adipose tissue. When questioned about this, patient reported he was aware of this, but unsure what it is. Plan to continue following up about this. Provided patient with a seated hamstring stretch for HEP. Patient would benefit from continued skilled physical therapy in order to address the abovementioned deficits and help patient return to his prior level of function.    Personal Factors and Comorbidities  Age;Comorbidity 3+    Comorbidities  History of stroke, HTN, hyperlipidemia    Examination-Activity Limitations  Stand;Bend;Locomotion Level;Transfers;Squat    Examination-Participation Restrictions  Meal Prep;Community Activity    Stability/Clinical Decision Making  Stable/Uncomplicated    Clinical Decision Making  Low    Rehab Potential  Good    PT Frequency  2x / week    PT Duration  4 weeks    PT Treatment/Interventions  ADLs/Self Care Home Management;Electrical Stimulation;Cryotherapy;Moist Heat;Traction;DME Instruction;Gait training;Stair training;Functional mobility training;Therapeutic  activities;Therapeutic exercise;Balance training;Neuromuscular re-education;Patient/family education;Orthotic Fit/Training;Manual techniques;Passive range of motion;Dry needling;Energy conservation;Taping    PT Next Visit Plan  Review evaluation goals, initial HEP, focus on lumbar mobility more flexion based check patient's response and modify as needed. Focus more on mobility than strengthening    PT Home Exercise Plan  11/12/18: Seated hamstring stretch 3x30'' each  LE    Consulted and Agree with Plan of Care  Patient       Patient will benefit from skilled therapeutic intervention in order to improve the following deficits and impairments:  Abnormal gait, Increased fascial restricitons, Improper body mechanics, Pain, Decreased mobility, Postural dysfunction, Decreased activity tolerance, Decreased endurance, Decreased range of motion, Decreased strength, Hypomobility, Decreased balance, Difficulty walking, Impaired flexibility  Visit Diagnosis: Acute low back pain, unspecified back pain laterality, unspecified whether sciatica present  Other symptoms and signs involving the musculoskeletal system     Problem List Patient Active Problem List   Diagnosis Date Noted  . Syncope 11/02/2017  . Syncope and collapse 03/08/2015  . Orthostatic hypotension 03/08/2015  . Accelerated hypertension 08/28/2014  . BPH (benign prostatic hypertrophy) with urinary obstruction 07/09/2014  . Right leg swelling 07/06/2014  . UTI (urinary tract infection) 07/06/2014  . ARF (acute renal failure) as a complication of the indwelling Foley 07/05/2014  . Cerebral infarction due to embolism of left middle cerebral artery ([place]) 06/29/2014  . Carotid stenosis 06/29/2014  . Urinary retention 06/29/2014  . Hematuria 06/29/2014  . Aphasia S/P CVA 05/28/2014  . Dysphagia, pharyngoesophageal phase 05/28/2014  . Left middle cerebral artery stroke ([place]) 05/26/2014  . Coarctation of aorta, recurrent, post-intervention    . Expressive aphasia   . Acute right-sided weakness   . Stroke ([place]) 05/23/2014  . S/P shoulder surgery 12/24/2012  . S/P arthroscopy of shoulder 12/16/2012  . Shoulder arthritis 12/13/2012  . Biceps tendon rupture, proximal 12/13/2012  . Partial tear of right subscapularis tendon 12/13/2012  . Hypertension 05/10/2011  . Hyperlipidemia 05/10/2011  . Barrett esophagus 05/10/2011   Clarene Critchley PT, DPT 5:52 PM, 11/12/18 [facility] [address] [place], [place], [address] Phone: [phone]   Fax:  [phone]  Name: SHUFORD BYWATERS MRN: [id number] Date of Birth: 04-Feb-1935

## 2018-11-16 DIAGNOSIS — Z23 Encounter for immunization: Secondary | ICD-10-CM | POA: Diagnosis not present

## 2018-11-19 ENCOUNTER — Ambulatory Visit (HOSPITAL_COMMUNITY): Payer: Medicare Other | Attending: Sports Medicine

## 2018-11-19 ENCOUNTER — Encounter (HOSPITAL_COMMUNITY): Payer: Self-pay

## 2018-11-19 ENCOUNTER — Other Ambulatory Visit: Payer: Self-pay

## 2018-11-19 DIAGNOSIS — R29898 Other symptoms and signs involving the musculoskeletal system: Secondary | ICD-10-CM | POA: Diagnosis not present

## 2018-11-19 DIAGNOSIS — M545 Low back pain, unspecified: Secondary | ICD-10-CM

## 2018-11-19 NOTE — Therapy (Signed)
[place], [place], [address] Phone: [phone]   Fax:  [phone]  Physical Therapy Treatment  Patient Details  Name: Henry [name] MRN: [id number] Date of Birth: [birth year] Referring Provider (PT): Berle Mull, MD   Encounter Date: 11/19/2018  PT End of Session - 11/19/18 1550    Visit Number  2    Number of Visits  8    Date for PT Re-Evaluation  12/10/18    Authorization Type  Medicare; T[employer]are: secondary    Authorization Time Period  11/12/18 - 12/10/18    Authorization - Visit Number  2    Authorization - Number of Visits  10    PT Start Time  [id number]    PT Stop Time  1615    PT Time Calculation (min)  38 min    Activity Tolerance  Patient tolerated treatment well    Behavior During Therapy  [facility] for tasks assessed/performed       Past Medical History:  Diagnosis Date  . Accelerated hypertension 06/29/2014  . Arthritis    Back  . Chronic kidney disease   . Dizziness and giddiness   . Enlarged prostate   . Esophagitis 05/10/2011  . Foley catheter in place   . FOOT SPRAIN, RIGHT 07/18/2007   Qualifier: Diagnosis of  By: Aline Brochure MD, [name]    . GERD (gastroesophageal reflux disease)   . HOH (hard of hearing)   . Hx-TIA (transient ischemic attack)    hx of this- wife states 10 years ago-but Dr. Estil Daft told wife he had had several-has 2 stents in carotid artery-left  . Hypercholesteremia   . Hypertension   . Low back pain   . Nocturia   . Stroke [facility])    had a stroke 05/23/2014  . Syncope and collapse     Past Surgical History:  Procedure Laterality Date  . carotid stents  05/2014   2 stents in carotid artery on left side  . COLONOSCOPY    . COLONOSCOPY  05/17/2011   Procedure: COLONOSCOPY;  Surgeon: [name], MD;  [facility];  Service: Endoscopy;  Laterality: N/A;  830  . epidural steroid injection  05/22/2014   had this in lumbar and at 1 am on 05/23/2014 had a stroke   . ESOPHAGOGASTRODUODENOSCOPY  10/28/2010   Procedure: ESOPHAGOGASTRODUODENOSCOPY (EGD);  Surgeon: [name], MD;  [facility];  Service: Endoscopy;  Laterality: N/A;  10:30  . ESOPHAGOGASTRODUODENOSCOPY N/A 04/24/2014   Procedure: ESOPHAGOGASTRODUODENOSCOPY (EGD);  Surgeon: [name], MD;  [facility];  Service: Endoscopy;  Laterality: N/A;  815  . ESOPHAGOGASTRODUODENOSCOPY N/A 06/26/2014   Procedure: ESOPHAGOGASTRODUODENOSCOPY (EGD);  Surgeon: [name], MD;  [facility];  Service: Endoscopy;  Laterality: N/A;  . EUS N/A 06/26/2014   Procedure: FULL UPPER ENDOSCOPIC ULTRASOUND (EUS) RADIAL;  Surgeon: [name], MD;  [facility];  Service: Endoscopy;  Laterality: N/A;  . IR GENERIC HISTORICAL  10/12/2015   IR ANGIO VERTEBRAL SEL VERTEBRAL BILAT MOD SED 10/12/2015 Luanne Bras, MD [facility]  . IR GENERIC HISTORICAL  10/12/2015   IR ANGIO INTRA EXTRACRAN SEL COM CAROTID INNOMINATE BILAT MOD SED 10/12/2015 Luanne Bras, MD [facility]  . RADIOLOGY WITH ANESTHESIA N/A 05/23/2014   Procedure: RADIOLOGY WITH ANESTHESIA;  Surgeon: Medication Radiologist, MD;  Location: [place];  Service: Radiology;  Laterality: N/A;  . RESECTION DISTAL CLAVICAL Right 12/13/2012   Procedure: RESECTION DISTAL CLAVICAL;  Surgeon: Carole Civil, MD;  [facility];  Service: Orthopedics;  Laterality: Right;  . SHOULDER ARTHROSCOPY WITH BICEPSTENOTOMY Right 12/13/2012   Procedure: SHOULDER ARTHROSCOPY WITH BICEPS TENOTOMY, limited shoulder debridement;  Surgeon: Carole Civil, MD;  [facility];  Service: Orthopedics;  Laterality: Right;  . SHOULDER SURGERY Right    torn cartiledge  . SHOULDER SURGERY     right shoulder  . TRANSURETHRAL RESECTION OF PROSTATE N/A 07/09/2014   Procedure: TRANSURETHRAL RESECTION of prostate WITH BUTTON ;  Surgeon: [name], MD;  [facility];  Service: Urology;  Laterality: N/A;    There were no  vitals filed for this visit.  Subjective Assessment - 11/19/18 1540    Subjective  Pt reports high pain scale, pain scale 8-9/10 LBP upon standing.    Pertinent History  Hx of CVA    Patient Stated Goals  Be able to walk and do exercises    Currently in Pain?  Yes    Pain Score  9     Pain Location  Back    Pain Orientation  Lower    Pain Descriptors / Indicators  --   "It's just pain"   Pain Type  Chronic pain    Pain Onset  More than a month ago    Pain Frequency  Intermittent   when standing   Aggravating Factors   standing and walking    Pain Relieving Factors  sitting or laying down    Effect of Pain on Daily Activities  limited         [facility] PT Assessment - 11/19/18 0001      Assessment   Medical Diagnosis  Lumbar Spondylosis, Leg strain    Referring Provider (PT)  Berle Mull, MD    Onset Date/Surgical Date  11/08/18    Next MD Visit  None    Prior Therapy  Yes, following stroke                   [facility] Adult PT Treatment/Exercise - 11/19/18 0001      Posture/Postural Control   Posture/Postural Control  Postural limitations    Postural Limitations  Increased thoracic kyphosis      Exercises   Exercises  Lumbar      Lumbar Exercises: Stretches   Active Hamstring Stretch  2 reps;30 seconds    Active Hamstring Stretch Limitations  supine wiht towel    Single Knee to Chest Stretch  2 reps;30 seconds    Lower Trunk Rotation  5 reps;10 seconds    Lower Trunk Rotation Limitations  pain free range      Lumbar Exercises: Supine   Other Supine Lumbar Exercises  breathing with abdominal sets 10x 5"             PT Education - 11/19/18 1549    Education Details  Reviewed goals, educated benefits of compliance with HEP for Lumbar mobility and established HEP    Person(s) Educated  Patient    Methods  Explanation;Demonstration    Comprehension  Verbalized understanding;Returned demonstration       PT Short Term Goals - 11/12/18 1552      PT  SHORT TERM GOAL #1   Title  Patient will report understanding and regular compliance with HEP to improve mobility, flexibility, and decrease pain.    Time  2    Period  Weeks    Status  New    Target Date  11/26/18  PT Long Term Goals - 11/12/18 1552      PT LONG TERM GOAL #1   Title  Patient will report that his back pain has not exceeded a 3/10 over the course of a 1 week period indicating improved tolerance to daily activities.    Time  4    Period  Weeks    Status  New    Target Date  12/10/18      PT LONG TERM GOAL #2   Title  Patient will report ability to ambulate for at least 20 minutes without onset of pain in order to return to walking program and ability to perform grocery shopping    Time  4    Period  Weeks    Status  New    Target Date  12/10/18      PT LONG TERM GOAL #3   Title  Patient will perform all lumbar AROM with minimal to no pain, indicating decreased irritability of pain for improved tolerance to mobility.    Time  4    Period  Weeks    Status  New    Target Date  12/10/18            Plan - 11/19/18 1552    Clinical Impression Statement  Reviewed goals, educated benefits/importance of compliance wiht HEP.  Session focus on lumbar mobility primarily stretches this session.  Pt able to follow cueing to improve form with exercises and able to recall and demonstrate good form/mechanics following initial cueing.  Increased cueing for abdominal sets to improve breathing with abdominal contraction.  No reports of pain through session.    Personal Factors and Comorbidities  Age;Comorbidity 3+    Comorbidities  History of stroke, HTN, hyperlipidemia    Examination-Activity Limitations  Stand;Bend;Locomotion Level;Transfers;Squat    Examination-Participation Restrictions  Meal Prep;Community Activity    Stability/Clinical Decision Making  Stable/Uncomplicated    Clinical Decision Making  Low    Rehab Potential  Good    PT Frequency  2x / week     PT Duration  4 weeks    PT Treatment/Interventions  ADLs/Self Care Home Management;Electrical Stimulation;Cryotherapy;Moist Heat;Traction;DME Instruction;Gait training;Stair training;Functional mobility training;Therapeutic activities;Therapeutic exercise;Balance training;Neuromuscular re-education;Patient/family education;Orthotic Fit/Training;Manual techniques;Passive range of motion;Dry needling;Energy conservation;Taping    PT Next Visit Plan  Focus on lumbar mobility more flexion based check patient's response and modify as needed. Focus more on mobility than strengthening    PT Home Exercise Plan  11/12/18: Seated hamstring stretch 3x30'' each LE; 11/19/18: LTR, SKTC, hamstring stretch in supine position (modified positon).       Patient will benefit from skilled therapeutic intervention in order to improve the following deficits and impairments:  Abnormal gait, Increased fascial restricitons, Improper body mechanics, Pain, Decreased mobility, Postural dysfunction, Decreased activity tolerance, Decreased endurance, Decreased range of motion, Decreased strength, Hypomobility, Decreased balance, Difficulty walking, Impaired flexibility  Visit Diagnosis: Other symptoms and signs involving the musculoskeletal system  Acute low back pain, unspecified back pain laterality, unspecified whether sciatica present     Problem List Patient Active Problem List   Diagnosis Date Noted  . Syncope 11/02/2017  . Syncope and collapse 03/08/2015  . Orthostatic hypotension 03/08/2015  . Accelerated hypertension 08/28/2014  . BPH (benign prostatic hypertrophy) with urinary obstruction 07/09/2014  . Right leg swelling 07/06/2014  . UTI (urinary tract infection) 07/06/2014  . ARF (acute renal failure) as a complication of the indwelling Foley 07/05/2014  . Cerebral infarction due to embolism of left middle  cerebral artery ([place]) 06/29/2014  . Carotid stenosis 06/29/2014  . Urinary retention 06/29/2014  .  Hematuria 06/29/2014  . Aphasia S/P CVA 05/28/2014  . Dysphagia, pharyngoesophageal phase 05/28/2014  . Left middle cerebral artery stroke ([place]) 05/26/2014  . Coarctation of aorta, recurrent, post-intervention   . Expressive aphasia   . Acute right-sided weakness   . Stroke ([place]) 05/23/2014  . S/P shoulder surgery 12/24/2012  . S/P arthroscopy of shoulder 12/16/2012  . Shoulder arthritis 12/13/2012  . Biceps tendon rupture, proximal 12/13/2012  . Partial tear of right subscapularis tendon 12/13/2012  . Hypertension 05/10/2011  . Hyperlipidemia 05/10/2011  . Barrett esophagus 05/10/2011   Ihor Austin, LPTA; CBIS [phone]  Aldona Lento 11/19/2018, 4:19 PM  [facility] [address] [place], [place], [address] Phone: [phone]   Fax:  [phone]  Name: AMOS DELAPP MRN: [id number] Date of Birth: 1934-03-20

## 2018-11-19 NOTE — Patient Instructions (Addendum)
Lower Trunk Rotation Stretch    Keeping back flat and feet together, rotate knees to left side. Hold 10 seconds. Repeat 10 times per set. Do 2 sessions per day.  4 days a week.  http://orth.exer.us/122   Copyright  VHI. All rights reserved.   Knee to Chest (Flexion)    Pull knee toward chest, may use towel for assistance.  Feel stretch in lower back or buttock area. Breathing deeply, Hold 30seconds. Repeat with other knee. Repeat 3 times. Do 2 sessions per day. 4 days a week  http://gt2.exer.us/225   Copyright  VHI. All rights reserved.   Hamstring Stretch, Seated (Strap, Two Chairs)    Sit with one leg extended onto facing chair. Loop strap over outstretched foot at ball of big toe. Lengthen spine. Hold for 30 breaths. Repeat 3 times each leg.  Copyright  VHI. All rights reserved.   OR Supine    Lie on back, legs bent and feet flat. Grasp behind one leg and slowly try to straighten knee. May use towel for assistance Hold 30 seconds.  Repeat 3 times per session. Do 1-2 sessions per day.  Copyright  VHI. All rights reserved.

## 2018-11-21 ENCOUNTER — Encounter (HOSPITAL_COMMUNITY): Payer: Self-pay

## 2018-11-21 ENCOUNTER — Other Ambulatory Visit: Payer: Self-pay

## 2018-11-21 ENCOUNTER — Ambulatory Visit (HOSPITAL_COMMUNITY): Payer: Medicare Other

## 2018-11-21 DIAGNOSIS — M545 Low back pain, unspecified: Secondary | ICD-10-CM

## 2018-11-21 DIAGNOSIS — R29898 Other symptoms and signs involving the musculoskeletal system: Secondary | ICD-10-CM

## 2018-11-21 NOTE — Therapy (Signed)
[place], [place], [address] Phone: [phone]   Fax:  [phone]  Physical Therapy Treatment  Patient Details  Name: Henry [name] MRN: [id number] Date of Birth: 04-12-1934 Referring Provider (PT): Berle Mull, MD   Encounter Date: 11/21/2018  PT End of Session - 11/21/18 1456    Visit Number  3    Number of Visits  8    Date for PT Re-Evaluation  12/10/18    Authorization Type  Medicare; Tricare: secondary    Authorization Time Period  11/12/18 - 12/10/18    Authorization - Visit Number  3    Authorization - Number of Visits  10    PT Start Time  1450    PT Stop Time  1532    PT Time Calculation (min)  42 min    Activity Tolerance  Patient tolerated treatment well    Behavior During Therapy  [facility] for tasks assessed/performed       Past Medical History:  Diagnosis Date  . Accelerated hypertension 06/29/2014  . Arthritis    Back  . Chronic kidney disease   . Dizziness and giddiness   . Enlarged prostate   . Esophagitis 05/10/2011  . Foley catheter in place   . FOOT SPRAIN, RIGHT 07/18/2007   Qualifier: Diagnosis of  By: Aline Brochure MD, [name]    . GERD (gastroesophageal reflux disease)   . HOH (hard of hearing)   . Hx-TIA (transient ischemic attack)    hx of this- wife states 10 years ago-but Dr. Estil Daft told wife he had had several-has 2 stents in carotid artery-left  . Hypercholesteremia   . Hypertension   . Low back pain   . Nocturia   . Stroke [facility])    had a stroke 05/23/2014  . Syncope and collapse     Past Surgical History:  Procedure Laterality Date  . carotid stents  05/2014   2 stents in carotid artery on left side  . COLONOSCOPY    . COLONOSCOPY  05/17/2011   Procedure: COLONOSCOPY;  Surgeon: [name], MD;  [facility];  Service: Endoscopy;  Laterality: N/A;  830  . epidural steroid injection  05/22/2014   had this in lumbar and at 1 am on 05/23/2014 had a stroke   . ESOPHAGOGASTRODUODENOSCOPY  10/28/2010   Procedure: ESOPHAGOGASTRODUODENOSCOPY (EGD);  Surgeon: [name], MD;  [facility];  Service: Endoscopy;  Laterality: N/A;  10:30  . ESOPHAGOGASTRODUODENOSCOPY N/A 04/24/2014   Procedure: ESOPHAGOGASTRODUODENOSCOPY (EGD);  Surgeon: [name], MD;  [facility];  Service: Endoscopy;  Laterality: N/A;  815  . ESOPHAGOGASTRODUODENOSCOPY N/A 06/26/2014   Procedure: ESOPHAGOGASTRODUODENOSCOPY (EGD);  Surgeon: [name], MD;  [facility];  Service: Endoscopy;  Laterality: N/A;  . EUS N/A 06/26/2014   Procedure: FULL UPPER ENDOSCOPIC ULTRASOUND (EUS) RADIAL;  Surgeon: [name], MD;  [facility];  Service: Endoscopy;  Laterality: N/A;  . IR GENERIC HISTORICAL  10/12/2015   IR ANGIO VERTEBRAL SEL VERTEBRAL BILAT MOD SED 10/12/2015 [facility] Bras, MD [facility]  . IR GENERIC HISTORICAL  10/12/2015   IR ANGIO INTRA EXTRACRAN SEL COM CAROTID INNOMINATE BILAT MOD SED 10/12/2015 Lu[facility] Bras, MD [facility]  . RADIOLOGY WITH ANESTHESIA N/A 05/23/2014   Procedure: RADIOLOGY WITH ANESTHESIA;  Surgeon: Medication Radiologist, MD;  Location: [place];  Service: Radiology;  Laterality: N/A;  . RESECTION DISTAL CLAVICAL Right 12/13/2012   Procedure: RESECTION DISTAL CLAVICAL;  Surgeon: Carole Civil, MD;  [facility];  Service: Orthopedics;  Laterality: Right;  . SHOULDER ARTHROSCOPY WITH BICEPSTENOTOMY Right 12/13/2012   Procedure: SHOULDER ARTHROSCOPY WITH BICEPS TENOTOMY, limited shoulder debridement;  Surgeon: Carole Civil, MD;  [facility];  Service: Orthopedics;  Laterality: Right;  . SHOULDER SURGERY Right    torn cartiledge  . SHOULDER SURGERY     right shoulder  . TRANSURETHRAL RESECTION OF PROSTATE N/A 07/09/2014   Procedure: TRANSURETHRAL RESECTION of prostate WITH BUTTON ;  Surgeon: [name], MD;  [facility];  Service: Urology;  Laterality: N/A;    There were no  vitals filed for this visit.  Subjective Assessment - 11/21/18 1453    Subjective  Pt reports he is feeling good today, no reports of pain just stiffness in lower back today.    Patient Stated Goals  Be able to walk and do exercises    Currently in Pain?  No/denies                       [facility] Adult PT Treatment/Exercise - 11/21/18 0001      Posture/Postural Control   Posture/Postural Control  Postural limitations    Postural Limitations  Increased thoracic kyphosis      Exercises   Exercises  Lumbar      Lumbar Exercises: Stretches   Active Hamstring Stretch  2 reps;30 seconds    Active Hamstring Stretch Limitations  supine wiht towel    Single Knee to Chest Stretch  2 reps;30 seconds    Single Knee to Chest Stretch Limitations  with towel    Lower Trunk Rotation  5 reps;10 seconds    Lower Trunk Rotation Limitations  pain free range    Prone on Elbows Stretch Limitations  2 minutes      Lumbar Exercises: Seated   Other Seated Lumbar Exercises  Lumbar stretch with hands on physioball 10x 10"      Lumbar Exercises: Quadruped   [place]/Old Horse  10 reps    [place]/Old Horse Limitations  10x 10" holds               PT Short Term Goals - 11/12/18 1552      PT SHORT TERM GOAL #1   Title  Patient will report understanding and regular compliance with HEP to improve mobility, flexibility, and decrease pain.    Time  2    Period  Weeks    Status  New    Target Date  11/26/18        PT Long Term Goals - 11/12/18 1552      PT LONG TERM GOAL #1   Title  Patient will report that his back pain has not exceeded a 3/10 over the course of a 1 week period indicating improved tolerance to daily activities.    Time  4    Period  Weeks    Status  New    Target Date  12/10/18      PT LONG TERM GOAL #2   Title  Patient will report ability to ambulate for at least 20 minutes without onset of pain in order to return to walking program and ability to perform  grocery shopping    Time  4    Period  Weeks    Status  New    Target Date  12/10/18      PT LONG TERM GOAL #3   Title  Patient will perform  all lumbar AROM with minimal to no pain, indicating decreased irritability of pain for improved tolerance to mobility.    Time  4    Period  Weeks    Status  New    Target Date  12/10/18            Plan - 11/21/18 1520    Clinical Impression Statement  Added quadruped cat/camel, DKTC, POE and lumbar stretche with physioball for spinal mobility.  Pt able to complete all exercises with no report of pain through session.  Pt hard of hearing which did require more tactile, verbal cueing and demonstration to assure correct form and mechanics.    Personal Factors and Comorbidities  Age;Comorbidity 3+    Comorbidities  History of stroke, HTN, hyperlipidemia    Examination-Activity Limitations  Stand;Bend;Locomotion Level;Transfers;Squat    Examination-Participation Restrictions  Meal Prep;Community Activity    Stability/Clinical Decision Making  Stable/Uncomplicated    Clinical Decision Making  Low    Rehab Potential  Good    PT Frequency  2x / week    PT Duration  4 weeks    PT Treatment/Interventions  ADLs/Self Care Home Management;Electrical Stimulation;Cryotherapy;Moist Heat;Traction;DME Instruction;Gait training;Stair training;Functional mobility training;Therapeutic activities;Therapeutic exercise;Balance training;Neuromuscular re-education;Patient/family education;Orthotic Fit/Training;Manual techniques;Passive range of motion;Dry needling;Energy conservation;Taping    PT Next Visit Plan  Focus on lumbar mobility more flexion based check patient's response and modify as needed. Focus more on mobility than strengthening    PT Home Exercise Plan  11/12/18: Seated hamstring stretch 3x30'' each LE; 11/19/18: LTR, SKTC, hamstring stretch in supine position (modified positon).       Patient will benefit from skilled therapeutic intervention in order  to improve the following deficits and impairments:  Abnormal gait, Increased fascial restricitons, Improper body mechanics, Pain, Decreased mobility, Postural dysfunction, Decreased activity tolerance, Decreased endurance, Decreased range of motion, Decreased strength, Hypomobility, Decreased balance, Difficulty walking, Impaired flexibility  Visit Diagnosis: Other symptoms and signs involving the musculoskeletal system  Acute low back pain, unspecified back pain laterality, unspecified whether sciatica present     Problem List Patient Active Problem List   Diagnosis Date Noted  . Syncope 11/02/2017  . Syncope and collapse 03/08/2015  . Orthostatic hypotension 03/08/2015  . Accelerated hypertension 08/28/2014  . BPH (benign prostatic hypertrophy) with urinary obstruction 07/09/2014  . Right leg swelling 07/06/2014  . UTI (urinary tract infection) 07/06/2014  . ARF (acute renal failure) as a complication of the indwelling Foley 07/05/2014  . Cerebral infarction due to embolism of left middle cerebral artery ([place]) 06/29/2014  . Carotid stenosis 06/29/2014  . Urinary retention 06/29/2014  . Hematuria 06/29/2014  . Aphasia S/P CVA 05/28/2014  . Dysphagia, pharyngoesophageal phase 05/28/2014  . Left middle cerebral artery stroke ([place]) 05/26/2014  . Coarctation of aorta, recurrent, post-intervention   . Expressive aphasia   . Acute right-sided weakness   . Stroke ([place]) 05/23/2014  . S/P shoulder surgery 12/24/2012  . S/P arthroscopy of shoulder 12/16/2012  . Shoulder arthritis 12/13/2012  . Biceps tendon rupture, proximal 12/13/2012  . Partial tear of right subscapularis tendon 12/13/2012  . Hypertension 05/10/2011  . Hyperlipidemia 05/10/2011  . Barrett esophagus 05/10/2011   Ihor Austin, LPTA; CBIS [phone]  Aldona Lento 11/21/2018, 7:21 PM  [facility] [place] [address] [place], [place], [address] Phone: [phone]    Fax:  [phone]  Name: Henry [name] MRN: [id number] Date of Birth: 12-25-1934

## 2018-11-26 ENCOUNTER — Ambulatory Visit (INDEPENDENT_AMBULATORY_CARE_PROVIDER_SITE_OTHER): Payer: Medicare Other | Admitting: Urology

## 2018-11-26 ENCOUNTER — Other Ambulatory Visit: Payer: Self-pay

## 2018-11-26 DIAGNOSIS — R310 Gross hematuria: Secondary | ICD-10-CM | POA: Diagnosis not present

## 2018-11-26 DIAGNOSIS — N401 Enlarged prostate with lower urinary tract symptoms: Secondary | ICD-10-CM | POA: Diagnosis not present

## 2018-11-27 ENCOUNTER — Ambulatory Visit (HOSPITAL_COMMUNITY): Payer: Medicare Other | Admitting: Physical Therapy

## 2018-11-27 ENCOUNTER — Encounter (HOSPITAL_COMMUNITY): Payer: Self-pay | Admitting: Physical Therapy

## 2018-11-27 ENCOUNTER — Other Ambulatory Visit: Payer: Self-pay

## 2018-11-27 DIAGNOSIS — R29898 Other symptoms and signs involving the musculoskeletal system: Secondary | ICD-10-CM | POA: Diagnosis not present

## 2018-11-27 DIAGNOSIS — M545 Low back pain, unspecified: Secondary | ICD-10-CM

## 2018-11-27 NOTE — Therapy (Signed)
[place], [place], [address] Phone: [phone]   Fax:  [phone]  Physical Therapy Treatment  Patient Details  Name: Henry [name] MRN: [id number] Date of Birth: Jun 12, 1934 Referring Provider (PT): Berle Mull, MD   Encounter Date: 11/27/2018  PT End of Session - 11/27/18 1523    Visit Number  4    Number of Visits  8    Date for PT Re-Evaluation  12/10/18    Authorization Type  Medicare; [employer]: secondary    Authorization Time Period  11/12/18 - 12/10/18    Authorization - Visit Number  4    Authorization - Number of Visits  10    PT Start Time  [id number]    PT Stop Time  1515    PT Time Calculation (min)  43 min    Activity Tolerance  Patient tolerated treatment well    Behavior During Therapy  [facility] for tasks assessed/performed       Past Medical History:  Diagnosis Date  . Accelerated hypertension 06/29/2014  . Arthritis    Back  . Chronic kidney disease   . Dizziness and giddiness   . Enlarged prostate   . Esophagitis 05/10/2011  . Foley catheter in place   . FOOT SPRAIN, RIGHT 07/18/2007   Qualifier: Diagnosis of  By: Aline Brochure MD, [name]    . GERD (gastroesophageal reflux disease)   . HOH (hard of hearing)   . Hx-TIA (transient ischemic attack)    hx of this- wife states 10 years ago-but Dr. Estil Daft told wife he had had several-has 2 stents in carotid artery-left  . Hypercholesteremia   . Hypertension   . Low back pain   . Nocturia   . Stroke [facility])    had a stroke 05/23/2014  . Syncope and collapse     Past Surgical History:  Procedure Laterality Date  . carotid stents  05/2014   2 stents in carotid artery on left side  . COLONOSCOPY    . COLONOSCOPY  05/17/2011   Procedure: COLONOSCOPY;  Surgeon: [name], MD;  [facility];  Service: Endoscopy;  Laterality: N/A;  830  . epidural steroid injection  05/22/2014   had this in lumbar and at 1 am on 05/23/2014 had a stroke   . ESOPHAGOGASTRODUODENOSCOPY  10/28/2010   Procedure: ESOPHAGOGASTRODUODENOSCOPY (EGD);  Surgeon: [name], MD;  [facility];  Service: Endoscopy;  Laterality: N/A;  10:30  . ESOPHAGOGASTRODUODENOSCOPY N/A 04/24/2014   Procedure: ESOPHAGOGASTRODUODENOSCOPY (EGD);  Surgeon: [name], MD;  [facility];  Service: Endoscopy;  Laterality: N/A;  815  . ESOPHAGOGASTRODUODENOSCOPY N/A 06/26/2014   Procedure: ESOPHAGOGASTRODUODENOSCOPY (EGD);  Surgeon: [name], MD;  Location[facility];  Service: Endoscopy;  Laterality: N/A;  . EUS N/A 06/26/2014   Procedure: FULL UPPER ENDOSCOPIC ULTRASOUND (EUS) RADIAL;  Surgeon: [name], MD;  [facility];  Service: Endoscopy;  Laterality: N/A;  . IR GENERIC HISTORICAL  10/12/2015   IR ANGIO VERTEBRAL SEL VERTEBRAL BILAT MOD SED 10/12/2015 Luanne Bras, MD [facility]  . IR GENERIC HISTORICAL  10/12/2015   IR ANGIO INTRA EXTRACRAN SEL COM CAROTID INNOMINATE BILAT MOD SED 10/12/2015 Luanne Bras, MD [facility]  . RADIOLOGY WITH ANESTHESIA N/A 05/23/2014   Procedure: RADIOLOGY WITH ANESTHESIA;  Surgeon: Medication Radiologist, MD;  Location: [place];  Service: Radiology;  Laterality: N/A;  . RESECTION DISTAL CLAVICAL Right 12/13/2012   Procedure: RESECTION DISTAL CLAVICAL;  Surgeon: Carole Civil, MD;  [facility];  Service: Orthopedics;  Laterality: Right;  . SHOULDER ARTHROSCOPY WITH BICEPSTENOTOMY Right 12/13/2012   Procedure: SHOULDER ARTHROSCOPY WITH BICEPS TENOTOMY, limited shoulder debridement;  Surgeon: Carole Civil, MD;  [facility];  Service: Orthopedics;  Laterality: Right;  . SHOULDER SURGERY Right    torn cartiledge  . SHOULDER SURGERY     right shoulder  . TRANSURETHRAL RESECTION OF PROSTATE N/A 07/09/2014   Procedure: TRANSURETHRAL RESECTION of prostate WITH BUTTON ;  Surgeon: [name], MD;  [facility];  Service: Urology;  Laterality: N/A;    There were no  vitals filed for this visit.  Subjective Assessment - 11/27/18 1438    Subjective  Patient says things are "not good". Patient says pain is mostly unchanged but denies radicular sx. Patient says he has been doing HEP but is no different.    Patient Stated Goals  Be able to walk and do exercises    Currently in Pain?  Yes    Pain Score  8     Pain Location  Back    Pain Orientation  Posterior;Lower    Pain Descriptors / Indicators  Aching;Dull                       [facility] Adult PT Treatment/Exercise - 11/27/18 0001      Lumbar Exercises: Stretches   Active Hamstring Stretch  Right;Left;5 reps;10 seconds   on 6 inch box at //   Single Knee to Chest Stretch  Right;Left   10 reps 5 seconds   Single Knee to Chest Stretch Limitations  with towel    Double Knee to Chest Stretch  --   10 x 5 second holds, BLEs on stability ball    Lower Trunk Rotation  10 seconds   10 reps both sides   Lower Trunk Rotation Limitations  pain free range    Piriformis Stretch  Right;Left;3 reps;30 seconds   with towel   Gastroc Stretch  Right;Left;3 reps;30 seconds   slant board     Lumbar Exercises: Supine   Ab Set  20 reps   with alternating LE march   Other Supine Lumbar Exercises  isometric glute set; 20x 5 second hold             PT Education - 11/27/18 1523    Education Details  Exercise technique    Person(s) Educated  Patient    Methods  Explanation;Verbal cues;Tactile cues    Comprehension  Verbalized understanding;Returned demonstration       PT Short Term Goals - 11/27/18 1526      PT SHORT TERM GOAL #1   Title  Patient will report understanding and regular compliance with HEP to improve mobility, flexibility, and decrease pain.    Time  2    Period  Weeks    Status  On-going    Target Date  11/26/18        PT Long Term Goals - 11/27/18 1527      PT LONG TERM GOAL #1   Title  Patient will report that his back pain has not exceeded a 3/10 over the course  of a 1 week period indicating improved tolerance to daily activities.    Time  4    Period  Weeks    Status  On-going      PT LONG TERM GOAL #2   Title  Patient will report ability to ambulate  for at least 20 minutes without onset of pain in order to return to walking program and ability to perform grocery shopping    Time  4    Period  Weeks    Status  On-going      PT LONG TERM GOAL #3   Title  Patient will perform all lumbar AROM with minimal to no pain, indicating decreased irritability of pain for improved tolerance to mobility.    Time  4    Period  Weeks    Status  On-going            Plan - 11/27/18 1524    Clinical Impression Statement  Patient tolerated session well but required frequent verbal cueing and occasional tactile cues for proper body mechanics with table exercise. Held on POE and extension exercise today to focus on flexion based stretching and exercise. Patient reported no change in sx post treatment. Patient continues to demo significant limitation in pain free lumbar AROM, and required tactile cueing with DKTC stretch on swiss ball to keep BLEs aligned with torso, likely due to core instability/ weakness.    Personal Factors and Comorbidities  Age;Comorbidity 3+    Comorbidities  History of stroke, HTN, hyperlipidemia    Examination-Activity Limitations  Stand;Bend;Locomotion Level;Transfers;Squat    Examination-Participation Restrictions  Meal Prep;Community Activity    Stability/Clinical Decision Making  Stable/Uncomplicated    Rehab Potential  Good    PT Frequency  2x / week    PT Duration  4 weeks    PT Treatment/Interventions  ADLs/Self Care Home Management;Electrical Stimulation;Cryotherapy;Moist Heat;Traction;DME Instruction;Gait training;Stair training;Functional mobility training;Therapeutic activities;Therapeutic exercise;Balance training;Neuromuscular re-education;Patient/family education;Orthotic Fit/Training;Manual techniques;Passive range of  motion;Dry needling;Energy conservation;Taping    PT Next Visit Plan  Progress core strengthening as tolerated. Add manual STM to lumbar if pain sx persist as elevated.    PT Home Exercise Plan  11/12/18: Seated hamstring stretch 3x30'' each LE; 11/19/18: LTR, SKTC, hamstring stretch in supine position (modified positon).       Patient will benefit from skilled therapeutic intervention in order to improve the following deficits and impairments:  Abnormal gait, Increased fascial restricitons, Improper body mechanics, Pain, Decreased mobility, Postural dysfunction, Decreased activity tolerance, Decreased endurance, Decreased range of motion, Decreased strength, Hypomobility, Decreased balance, Difficulty walking, Impaired flexibility  Visit Diagnosis: Other symptoms and signs involving the musculoskeletal system  Acute low back pain, unspecified back pain laterality, unspecified whether sciatica present     Problem List Patient Active Problem List   Diagnosis Date Noted  . Syncope 11/02/2017  . Syncope and collapse 03/08/2015  . Orthostatic hypotension 03/08/2015  . Accelerated hypertension 08/28/2014  . BPH (benign prostatic hypertrophy) with urinary obstruction 07/09/2014  . Right leg swelling 07/06/2014  . UTI (urinary tract infection) 07/06/2014  . ARF (acute renal failure) as a complication of the indwelling Foley 07/05/2014  . Cerebral infarction due to embolism of left middle cerebral artery ([place]) 06/29/2014  . Carotid stenosis 06/29/2014  . Urinary retention 06/29/2014  . Hematuria 06/29/2014  . Aphasia S/P CVA 05/28/2014  . Dysphagia, pharyngoesophageal phase 05/28/2014  . Left middle cerebral artery stroke ([place]) 05/26/2014  . Coarctation of aorta, recurrent, post-intervention   . Expressive aphasia   . Acute right-sided weakness   . Stroke ([place]) 05/23/2014  . S/P shoulder surgery 12/24/2012  . S/P arthroscopy of shoulder 12/16/2012  . Shoulder arthritis 12/13/2012  .  Biceps tendon rupture, proximal 12/13/2012  . Partial tear of right subscapularis tendon 12/13/2012  . Hypertension  05/10/2011  . Hyperlipidemia 05/10/2011  . Barrett esophagus 05/10/2011    Elizbeth Squires PT DPT 11/27/2018, 3:38 PM  [place] [address] [place], [place], [address] Phone: [phone]   Fax:  [phone]  Name: Henry [name] MRN: [id number] Date of Birth: [birth year]

## 2018-11-29 ENCOUNTER — Other Ambulatory Visit: Payer: Self-pay

## 2018-11-29 ENCOUNTER — Encounter (HOSPITAL_COMMUNITY): Payer: Self-pay | Admitting: Physical Therapy

## 2018-11-29 ENCOUNTER — Ambulatory Visit (HOSPITAL_COMMUNITY): Payer: Medicare Other | Admitting: Physical Therapy

## 2018-11-29 DIAGNOSIS — R29898 Other symptoms and signs involving the musculoskeletal system: Secondary | ICD-10-CM | POA: Diagnosis not present

## 2018-11-29 DIAGNOSIS — M545 Low back pain, unspecified: Secondary | ICD-10-CM

## 2018-11-29 NOTE — Therapy (Signed)
[place], [place], [address] Phone: [phone]   Fax:  [phone]  Physical Therapy Treatment  Patient Details  Name: Henry [name] MRN: [id number] Date of Birth: [birth year] Referring Provider (PT): Berle Mull, MD   Encounter Date: 11/29/2018  PT End of Session - 11/29/18 1405    Visit Number  5    Number of Visits  8    Date for PT Re-Evaluation  12/10/18    Authorization Type  Medicare; T[employer]: secondary    Authorization Time Period  11/12/18 - 12/10/18    Authorization - Visit Number  5    Authorization - Number of Visits  10    PT Start Time  1400   patient arrived late   PT Stop Time  1430    PT Time Calculation (min)  30 min    Activity Tolerance  Patient tolerated treatment well    Behavior During Therapy  [facility] for tasks assessed/performed       Past Medical History:  Diagnosis Date  . Accelerated hypertension 06/29/2014  . Arthritis    Back  . Chronic kidney disease   . Dizziness and giddiness   . Enlarged prostate   . Esophagitis 05/10/2011  . Foley catheter in place   . FOOT SPRAIN, RIGHT 07/18/2007   Qualifier: Diagnosis of  By: Aline Brochure MD, [name]    . GERD (gastroesophageal reflux disease)   . HOH (hard of hearing)   . Hx-TIA (transient ischemic attack)    hx of this- wife states 10 years ago-but Dr. Estil Daft told wife he had had several-has 2 stents in carotid artery-left  . Hypercholesteremia   . Hypertension   . Low back pain   . Nocturia   . Stroke [facility])    had a stroke 05/23/2014  . Syncope and collapse     Past Surgical History:  Procedure Laterality Date  . carotid stents  05/2014   2 stents in carotid artery on left side  . COLONOSCOPY    . COLONOSCOPY  05/17/2011   Procedure: COLONOSCOPY;  Surgeon: [name], MD;  [facility];  Service: Endoscopy;  Laterality: N/A;  830  . epidural steroid injection  05/22/2014   had this in lumbar and at 1 am on  05/23/2014 had a stroke  . ESOPHAGOGASTRODUODENOSCOPY  10/28/2010   Procedure: ESOPHAGOGASTRODUODENOSCOPY (EGD);  Surgeon: [name], MD;  [facility];  Service: Endoscopy;  Laterality: N/A;  10:30  . ESOPHAGOGASTRODUODENOSCOPY N/A 04/24/2014   Procedure: ESOPHAGOGASTRODUODENOSCOPY (EGD);  Surgeon: [name], MD;  [facility];  Service: Endoscopy;  Laterality: N/A;  815  . ESOPHAGOGASTRODUODENOSCOPY N/A 06/26/2014   Procedure: ESOPHAGOGASTRODUODENOSCOPY (EGD);  Surgeon: [name], MD;  [facility];  Service: Endoscopy;  Laterality: N/A;  . EUS N/A 06/26/2014   Procedure: FULL UPPER ENDOSCOPIC ULTRASOUND (EUS) RADIAL;  Surgeon: [name], MD;  [facility];  Service: Endoscopy;  Laterality: N/A;  . IR GENERIC HISTORICAL  10/12/2015   IR ANGIO VERTEBRAL SEL VERTEBRAL BILAT MOD SED 10/12/2015 Lu[facility] Bras, MD [facility]  . IR GENERIC HISTORICAL  10/12/2015   IR ANGIO INTRA EXTRACRAN SEL COM CAROTID INNOMINATE BILAT MOD SED 10/12/2015 Lu[facility] Bras, MD [facility]  . RADIOLOGY WITH ANESTHESIA N/A 05/23/2014   Procedure: RADIOLOGY WITH ANESTHESIA;  Surgeon: Medication Radiologist, MD;  Location: [place];  Service: Radiology;  Laterality: N/A;  . RESECTION DISTAL CLAVICAL Right 12/13/2012  Procedure: RESECTION DISTAL CLAVICAL;  Surgeon: Carole Civil, MD;  [facility];  Service: Orthopedics;  Laterality: Right;  . SHOULDER ARTHROSCOPY WITH BICEPSTENOTOMY Right 12/13/2012   Procedure: SHOULDER ARTHROSCOPY WITH BICEPS TENOTOMY, limited shoulder debridement;  Surgeon: Carole Civil, MD;  [facility];  Service: Orthopedics;  Laterality: Right;  . SHOULDER SURGERY Right    torn cartiledge  . SHOULDER SURGERY     right shoulder  . TRANSURETHRAL RESECTION OF PROSTATE N/A 07/09/2014   Procedure: TRANSURETHRAL RESECTION of prostate WITH BUTTON ;  Surgeon: [name], MD;  [facility];  Service: Urology;  Laterality:  N/A;    There were no vitals filed for this visit.  Subjective Assessment - 11/29/18 1402    Subjective  Patient says he is doing better today. Patient says he was able to do HEP and do some exercise at the [facility] this morning and is about 6/10 pain currently.    Patient Stated Goals  Be able to walk and do exercises    Currently in Pain?  Yes    Pain Score  6     Pain Location  Back    Pain Orientation  Lower;Mid    Pain Descriptors / Indicators  Aching;Dull                       [facility] Adult PT Treatment/Exercise - 11/29/18 0001      Lumbar Exercises: Stretches   Single Knee to Chest Stretch  Right;Left   10 reps 5 seconds   Single Knee to Chest Stretch Limitations  with towel    Double Knee to Chest Stretch  --   10 x 5 seconds with BLE on stability ball   Lower Trunk Rotation  10 seconds   10 x   Lower Trunk Rotation Limitations  pain free range    Piriformis Stretch  Right;Left;3 reps;30 seconds      Lumbar Exercises: Standing   Other Standing Lumbar Exercises  standing hip abduction/ extension    x10 each with HHA x 2 at //     Lumbar Exercises: Seated   Sit to Stand  10 reps   from low mat, no UE assist   Other Seated Lumbar Exercises  Lumbar stretch with hands on physioball 10x 10"   FWD/ lateral x 10 each     Lumbar Exercises: Supine   Ab Set  20 reps    AB Set Limitations  With alternating BLE marching    Bridge  10 reps;3 seconds               PT Short Term Goals - 11/27/18 1526      PT SHORT TERM GOAL #1   Title  Patient will report understanding and regular compliance with HEP to improve mobility, flexibility, and decrease pain.    Time  2    Period  Weeks    Status  On-going    Target Date  11/26/18        PT Long Term Goals - 11/27/18 1527      PT LONG TERM GOAL #1   Title  Patient will report that his back pain has not exceeded a 3/10 over the course of a 1 week period indicating improved tolerance to daily activities.     Time  4    Period  Weeks    Status  On-going      PT LONG TERM GOAL #2   Title  Patient will report ability to ambulate for at least 20 minutes without onset of pain in order to return to walking program and ability to perform grocery shopping    Time  4    Period  Weeks    Status  On-going      PT LONG TERM GOAL #3   Title  Patient will perform all lumbar AROM with minimal to no pain, indicating decreased irritability of pain for improved tolerance to mobility.    Time  4    Period  Weeks    Status  On-going            Plan - 11/29/18 1437    Clinical Impression Statement  Patient tolerated session well today. Patient continues to require cueing for foot and leg placement with table exerfcise, and for slowing activity for proper muscle activation. Patient was able to progress glute iso to bridging with no increased pain. Patient was able to perform standing hip strengthening exercise with no increased complaint of pain. Patient reports no exacerbation of sx during todays treatment. Patient will continue to benefit from therapy to progress core strength and stability to reduce back pain.    Personal Factors and Comorbidities  Age;Comorbidity 3+    Comorbidities  History of stroke, HTN, hyperlipidemia    Examination-Activity Limitations  Stand;Bend;Locomotion Level;Transfers;Squat    Examination-Participation Restrictions  Meal Prep;Community Activity    Stability/Clinical Decision Making  Stable/Uncomplicated    Rehab Potential  Good    PT Frequency  2x / week    PT Duration  4 weeks    PT Treatment/Interventions  ADLs/Self Care Home Management;Electrical Stimulation;Cryotherapy;Moist Heat;Traction;DME Instruction;Gait training;Stair training;Functional mobility training;Therapeutic activities;Therapeutic exercise;Balance training;Neuromuscular re-education;Patient/family education;Orthotic Fit/Training;Manual techniques;Passive range of motion;Dry needling;Energy  conservation;Taping    PT Next Visit Plan  Progress core strengthening as tolerated. Add palloff press next visit    PT Home Exercise Plan  11/12/18: Seated hamstring stretch 3x30'' each LE; 11/19/18: LTR, SKTC, hamstring stretch in supine position (modified positon).       Patient will benefit from skilled therapeutic intervention in order to improve the following deficits and impairments:  Abnormal gait, Increased fascial restricitons, Improper body mechanics, Pain, Decreased mobility, Postural dysfunction, Decreased activity tolerance, Decreased endurance, Decreased range of motion, Decreased strength, Hypomobility, Decreased balance, Difficulty walking, Impaired flexibility  Visit Diagnosis: Other symptoms and signs involving the musculoskeletal system  Acute low back pain, unspecified back pain laterality, unspecified whether sciatica present     Problem List Patient Active Problem List   Diagnosis Date Noted  . Syncope 11/02/2017  . Syncope and collapse 03/08/2015  . Orthostatic hypotension 03/08/2015  . Accelerated hypertension 08/28/2014  . BPH (benign prostatic hypertrophy) with urinary obstruction 07/09/2014  . Right leg swelling 07/06/2014  . UTI (urinary tract infection) 07/06/2014  . ARF (acute renal failure) as a complication of the indwelling Foley 07/05/2014  . Cerebral infarction due to embolism of left middle cerebral artery ([place]) 06/29/2014  . Carotid stenosis 06/29/2014  . Urinary retention 06/29/2014  . Hematuria 06/29/2014  . Aphasia S/P CVA 05/28/2014  . Dysphagia, pharyngoesophageal phase 05/28/2014  . Left middle cerebral artery stroke ([place]) 05/26/2014  . Coarctation of aorta, recurrent, post-intervention   . Expressive aphasia   . Acute right-sided weakness   . Stroke ([place]) 05/23/2014  . S/P shoulder surgery 12/24/2012  . S/P arthroscopy of shoulder 12/16/2012  . Shoulder arthritis 12/13/2012  . Biceps tendon rupture, proximal 12/13/2012  . Partial  tear of right subscapularis tendon  12/13/2012  . Hypertension 05/10/2011  . Hyperlipidemia 05/10/2011  . Barrett esophagus 05/10/2011    Elizbeth Squires PT DPT 11/29/2018, 2:40 PM  [place] [address] [place], [place], [address] Phone: [phone]   Fax:  [phone]  Name: Henry [name] MRN: [id number] Date of Birth: 1934/09/14

## 2018-12-03 ENCOUNTER — Ambulatory Visit (HOSPITAL_COMMUNITY): Payer: Medicare Other | Admitting: Physical Therapy

## 2018-12-03 ENCOUNTER — Other Ambulatory Visit: Payer: Self-pay

## 2018-12-03 DIAGNOSIS — M545 Low back pain, unspecified: Secondary | ICD-10-CM

## 2018-12-03 DIAGNOSIS — R000 Tachycardia, unspecified: Secondary | ICD-10-CM | POA: Diagnosis not present

## 2018-12-03 DIAGNOSIS — N182 Chronic kidney disease, stage 2 (mild): Secondary | ICD-10-CM | POA: Diagnosis not present

## 2018-12-03 DIAGNOSIS — I130 Hypertensive heart and chronic kidney disease with heart failure and stage 1 through stage 4 chronic kidney disease, or unspecified chronic kidney disease: Secondary | ICD-10-CM | POA: Diagnosis not present

## 2018-12-03 DIAGNOSIS — I5032 Chronic diastolic (congestive) heart failure: Secondary | ICD-10-CM | POA: Diagnosis not present

## 2018-12-03 DIAGNOSIS — R29898 Other symptoms and signs involving the musculoskeletal system: Secondary | ICD-10-CM

## 2018-12-03 NOTE — Therapy (Signed)
[place], [place], [address] Phone: [phone]   Fax:  [phone]  Patient Details  Name: Henry [name] MRN: [id number] Date of Birth: 01-28-1935 Referring Provider:  Leanna Battles, MD  Encounter Date: 12/03/2018  Patient arrived to therapy reporting that when he checked his heart rate this morning it was high and said that it was 101. He stated that his blood pressure was found to be 102/60. He reported that he has already contacted his cardiologist and that she told him to continue to monitor it and call again tomorrow morning. Assessed patient's HR this session at rest and it was found to be 101 BPM. Patient also reported that he has had some dizziness intermittently throughout the day. Discussed with patient that we would hold therapy this date and that he should continue to monitor his vitals and symptoms and call his cardiologist again tomorrow. Explained that if his symptoms worsened before that time, to call his cardiologist or go to the ED.   Clarene Critchley PT, DPT 3:07 PM, 12/03/18 [place], [place], [address] Phone: [phone]   Fax:  [phone]

## 2018-12-04 ENCOUNTER — Telehealth (HOSPITAL_COMMUNITY): Payer: Self-pay | Admitting: Physical Therapy

## 2018-12-04 NOTE — Telephone Encounter (Signed)
[name] CALLED TO CANCEL THE APPT SINCE THE PT HAS AN APPT WITH THE HEART MD IN THE AM. PER [name] THEY WILL  LET us KNOW IF THE PT WILL CONTINUE THERAPY.

## 2018-12-05 ENCOUNTER — Encounter (HOSPITAL_COMMUNITY): Payer: Medicare Other | Admitting: Physical Therapy

## 2018-12-05 ENCOUNTER — Encounter: Payer: Self-pay | Admitting: Cardiology

## 2018-12-05 ENCOUNTER — Other Ambulatory Visit: Payer: Self-pay

## 2018-12-05 ENCOUNTER — Ambulatory Visit (INDEPENDENT_AMBULATORY_CARE_PROVIDER_SITE_OTHER): Payer: Medicare Other | Admitting: Cardiology

## 2018-12-05 VITALS — BP 143/62 | HR 54 | Ht 67.0 in | Wt 236.0 lb

## 2018-12-05 DIAGNOSIS — I350 Nonrheumatic aortic (valve) stenosis: Secondary | ICD-10-CM | POA: Diagnosis not present

## 2018-12-05 DIAGNOSIS — R55 Syncope and collapse: Secondary | ICD-10-CM | POA: Diagnosis not present

## 2018-12-05 DIAGNOSIS — I10 Essential (primary) hypertension: Secondary | ICD-10-CM | POA: Diagnosis not present

## 2018-12-05 DIAGNOSIS — R000 Tachycardia, unspecified: Secondary | ICD-10-CM | POA: Diagnosis not present

## 2018-12-05 NOTE — Progress Notes (Signed)
Primary Physician:  Leanna Battles, MD   Patient ID: Henry [name], male    DOB: Jan 12, 1935, 84 y.o.   MRN: [id number]  Subjective:    Chief Complaint  Patient presents with  . Tachycardia    HPI: Henry [name]  is a 84 y.o. male  with history of hypertension, hyperlipidemia, hyperglycemia and asymptomatic carotid disease with history of left carotid angioplasty in 2016 with history of CVA in April 2016. He has history of recurrent syncope, last episode in December 2016 and has been attributed to vasovagal episodes.   Patient is here for acute visit as he was recently found to have elevated heart rate in the upper 90's to low 100's at home, PT, and also confirmed at PCP office. He was asymptomatic in regards to this. After discussion with [place], he was placed on 12.5 mg of atenolol. He was previously on Atenolol at 50 mg, but was discontinued due to bradycardia. He is tolerating atenolol well, no complaints today.  Home blood pressure log shows good blood pressure control. He has history of occasional dizziness, but he denies this today. He has not had any episodes of syncope over the last year. He is having difficulty with his back; therefore, is undergoing PT.   Past Medical History:  Diagnosis Date  . Accelerated hypertension 06/29/2014  . Arthritis    Back  . Chronic kidney disease   . Dizziness and giddiness   . Enlarged prostate   . Esophagitis 05/10/2011  . Foley catheter in place   . FOOT SPRAIN, RIGHT 07/18/2007   Qualifier: Diagnosis of  By: Aline Brochure MD, [name]    . GERD (gastroesophageal reflux disease)   . HOH (hard of hearing)   . Hx-TIA (transient ischemic attack)    hx of this- wife states 10 years ago-but Dr. Estil Daft told wife he had had several-has 2 stents in carotid artery-left  . Hypercholesteremia   . Hypertension   . Low back pain   . Nocturia   . Stroke [facility])    had a stroke 05/23/2014  . Syncope and collapse     Past Surgical History:   Procedure Laterality Date  . carotid stents  05/2014   2 stents in carotid artery on left side  . COLONOSCOPY    . COLONOSCOPY  05/17/2011   Procedure: COLONOSCOPY;  Surgeon: [name], MD;  [facility];  Service: Endoscopy;  Laterality: N/A;  830  . epidural steroid injection  05/22/2014   had this in lumbar and at 1 am on 05/23/2014 had a stroke  . ESOPHAGOGASTRODUODENOSCOPY  10/28/2010   Procedure: ESOPHAGOGASTRODUODENOSCOPY (EGD);  Surgeon: [name], MD;  [facility];  Service: Endoscopy;  Laterality: N/A;  10:30  . ESOPHAGOGASTRODUODENOSCOPY N/A 04/24/2014   Procedure: ESOPHAGOGASTRODUODENOSCOPY (EGD);  Surgeon: [name], MD;  [facility];  Service: Endoscopy;  Laterality: N/A;  815  . ESOPHAGOGASTRODUODENOSCOPY N/A 06/26/2014   Procedure: ESOPHAGOGASTRODUODENOSCOPY (EGD);  Surgeon: [name], MD;  [facility];  Service: Endoscopy;  Laterality: N/A;  . EUS N/A 06/26/2014   Procedure: FULL UPPER ENDOSCOPIC ULTRASOUND (EUS) RADIAL;  Surgeon: [name], MD;  [facility];  Service: Endoscopy;  Laterality: N/A;  . IR GENERIC HISTORICAL  10/12/2015   IR ANGIO VERTEBRAL SEL VERTEBRAL BILAT MOD SED 10/12/2015 Luanne Bras, MD [facility]  . IR GENERIC HISTORICAL  10/12/2015   IR ANGIO INTRA EXTRACRAN SEL COM CAROTID INNOMINATE BILAT MOD SED 10/12/2015 Willaim Rayas  Estanislado Pandy, MD [facility]  . RADIOLOGY WITH ANESTHESIA N/A 05/23/2014   Procedure: RADIOLOGY WITH ANESTHESIA;  Surgeon: Medication Radiologist, MD;  Location: [place];  Service: Radiology;  Laterality: N/A;  . RESECTION DISTAL CLAVICAL Right 12/13/2012   Procedure: RESECTION DISTAL CLAVICAL;  Surgeon: Carole Civil, MD;  [facility];  Service: Orthopedics;  Laterality: Right;  . SHOULDER ARTHROSCOPY WITH BICEPSTENOTOMY Right 12/13/2012   Procedure: SHOULDER ARTHROSCOPY WITH BICEPS TENOTOMY, limited shoulder debridement;  Surgeon: Carole Civil,  MD;  [facility];  Service: Orthopedics;  Laterality: Right;  . SHOULDER SURGERY Right    torn cartiledge  . SHOULDER SURGERY     right shoulder  . TRANSURETHRAL RESECTION OF PROSTATE N/A 07/09/2014   Procedure: TRANSURETHRAL RESECTION of prostate WITH BUTTON ;  Surgeon: [name], MD;  [facility];  Service: Urology;  Laterality: N/A;    Social History   Socioeconomic History  . Marital status: Married    Spouse name: Not on file  . Number of children: 4  . Years of education: 42  . Highest education level: Not on file  Occupational History  . Occupation: retired    Comment: [employer]  Social Needs  . Financial resource strain: Not on file  . Food insecurity    Worry: Not on file    Inability: Not on file  . Transportation needs    Medical: Not on file    Non-medical: Not on file  Tobacco Use  . Smoking status: Former Smoker    Packs/day: 0.50    Years: 35.00    Pack years: 17.50    Types: Cigarettes    Quit date: 12/10/1994    Years since quitting: 24.0  . Smokeless tobacco: Never Used  Substance and Sexual Activity  . Alcohol use: Yes    Alcohol/week: 3.0 standard drinks    Types: 3 Glasses of wine per week  . Drug use: No  . Sexual activity: Yes    Birth control/protection: None  Lifestyle  . Physical activity    Days per week: Not on file    Minutes per session: Not on file  . Stress: Not on file  Relationships  . Social Herbalist on phone: Not on file    Gets together: Not on file    Attends religious service: Not on file    Active member of club or organization: Not on file    Attends meetings of clubs or organizations: Not on file    Relationship status: Not on file  . Intimate partner violence    Fear of current or ex partner: Not on file    Emotionally abused: Not on file    Physically abused: Not on file    Forced sexual activity: Not on file  Other Topics Concern  . Not on file  Social History Narrative   Married,  retired - [employer]   Right handed   Caffeine use - 3-4 cups coffee daily    Review of Systems  Constitution: Negative for decreased appetite, malaise/fatigue, weight gain and weight loss.  Eyes: Negative for visual disturbance.  Cardiovascular: Negative for chest pain, claudication, dyspnea on exertion, leg swelling, orthopnea, palpitations and syncope.  Respiratory: Negative for hemoptysis and wheezing.   Endocrine: Negative for cold intolerance and heat intolerance.  Hematologic/Lymphatic: Does not bruise/bleed easily.  Skin: Negative for nail changes.  Musculoskeletal: Positive for back pain. Negative for muscle weakness and myalgias.  Gastrointestinal: Negative for abdominal pain, change  in bowel habit, nausea and vomiting.  Neurological: Positive for focal weakness (right, residual from CVA). Negative for difficulty with concentration, dizziness and headaches.  Psychiatric/Behavioral: Negative for altered mental status and suicidal ideas.  All other systems reviewed and are negative.     Objective:  Blood pressure (!) 143/62, pulse (!) 54, height 5\' 7"  (1.702 m), weight 236 lb (107 kg), SpO2 97 %. Body mass index is 36.96 kg/m.    Physical Exam  Constitutional: He is oriented to person, place, and time. Vital signs are normal. He appears well-developed and well-nourished.  HENT:  Head: Normocephalic and atraumatic.  Neck: Normal range of motion.  Cardiovascular: Normal rate, regular rhythm and intact distal pulses.  Murmur heard.  Early systolic murmur is present with a grade of 2/6 radiating to the neck. Pulses:      Carotid pulses are on the right side with bruit and on the left side with bruit. trace edema  Pulmonary/Chest: Effort normal and breath sounds normal. No accessory muscle usage. No respiratory distress.  Abdominal: Soft. Bowel sounds are normal.  obese  Musculoskeletal: Normal range of motion.  Neurological: He is alert and oriented to person, place, and  time.  Skin: Skin is warm and dry.  Vitals reviewed.  Radiology: No results found.  Laboratory examination:    CMP Latest Ref Rng & Units 11/03/2017 11/02/2017 10/12/2015  Glucose 70 - 99 mg/dL 105(H) 105(H) 100(H)  BUN 8 - 23 mg/dL 14 19 13   Creatinine 0.61 - 1.24 mg/dL 1.02 1.28(H) 0.96  Sodium 135 - 145 mmol/L 135 132(L) 139  Potassium 3.5 - 5.1 mmol/L 4.2 3.1(L) 3.6  Chloride 98 - 111 mmol/L 104 99 105  CO2 22 - 32 mmol/L 26 26 26   Calcium 8.9 - 10.3 mg/dL 8.5(L) 8.7(L) 9.2  Total Protein 6.5 - 8.1 g/dL 6.2(L) 6.9 -  Total Bilirubin 0.3 - 1.2 mg/dL 0.8 0.6 -  Alkaline Phos 38 - 126 U/L 35(L) 37(L) -  AST 15 - 41 U/L 25 23 -  ALT 0 - 44 U/L 19 20 -   CBC Latest Ref Rng & Units 11/03/2017 11/02/2017 10/12/2015  WBC 4.0 - 10.5 K/uL 8.6 11.6(H) 7.2  Hemoglobin 13.0 - 17.0 g/dL 12.9(L) 13.2 13.4  Hematocrit 39.0 - 52.0 % 38.8(L) 40.0 42.0  Platelets 150 - 400 K/uL 210 222 263   Lipid Panel     Component Value Date/Time   CHOL 110 05/24/2014 0354   TRIG 79 05/24/2014 0354   HDL 35 (L) 05/24/2014 0354   CHOLHDL 3.1 05/24/2014 0354   VLDL 16 05/24/2014 0354   LDLCALC 59 05/24/2014 0354   HEMOGLOBIN A1C Lab Results  Component Value Date   HGBA1C 6.2 (H) 05/24/2014   MPG 131 05/24/2014   TSH No results for input(s): TSH in the last 8760 hours.  PRN Meds:. There are no discontinued medications. Current Meds  Medication Sig  . aspirin 81 MG tablet Take 81 mg by mouth at bedtime.   Marland Kitchen atenolol (TENORMIN) 25 MG tablet Take 12.5 mg by mouth daily.  Marland Kitchen atorvastatin (LIPITOR) 40 MG tablet Take 1 tablet (40 mg total) by mouth every evening.  . chlorthalidone (HYGROTON) 25 MG tablet Take 25 mg by mouth every morning.   . Cholecalciferol (VITAMIN D PO) Take 1 capsule by mouth every evening.  . clopidogrel (PLAVIX) 75 MG tablet TAKE 1 TABLET DAILY (Patient taking differently: Take 75 mg by mouth every morning. )  . finasteride (PROSCAR) 5 MG tablet Take  1 tablet (5 mg total) by  mouth daily. (Patient taking differently: Take 5 mg by mouth every evening. )  . hydrALAZINE (APRESOLINE) 25 MG tablet TAKE 1 TABLET THREE TIMES A DAY  . olmesartan (BENICAR) 40 MG tablet TAKE 1 TABLET DAILY  . pantoprazole (PROTONIX) 40 MG tablet Take 40 mg by mouth daily.  . polyethylene glycol (MIRALAX / GLYCOLAX) packet Take 17 g by mouth every morning.    Cardiac Studies:   Echocardiogram 05/25/2016: Left ventricle cavity is normal in size. Mild concentric hypertrophy of the left ventricle. Normal global wall motion. Normal diastolic filling pattern, normal LAP. Calculated EF 60%. Left atrial cavity is mildly dilated at 4.3 cm. Mild aortic valve leaflet calcification. Trace aortic valve stenosis. No aortic valve regurgitation noted. Aortic valve peak pressure gradient of 17 and mean gradient of 8 mHg, calculated aortic valve area 1.56 cm. Trace tricuspid regurgitation. No evidence of pulmonary hypertension. Compared to 04/14/2015, trace aortic stenosis new fining.  Event Monitor 30 days 02/19/2015: NSR, No symptoms reported. No A. Fib or heart block.  Lexiscan myoview stress test 02/26/2015: 1. The resting electrocardiogram demonstrated normal sinus rhythm, incomplete RBBB and no resting arrhythmias. Stress EKG is non-diagnostic for ischemia as it a pharmacologic stress using Lexiscan.Stress symptoms included dyspnea. 2. The perfusion imaging study demonstrates a very small sized inferolateral and inferoapical subtle ischemia. LVEF was normal without regional wall motion at 77%. This represents a low risk study.  Assessment:   Sinus tachycardia  Primary hypertension - Plan: EKG 12-Lead, CANCELED: POCT INR  Syncope and collapse  Trace aortic stenosis by prior echocardiogram - Plan: EKG 12-Lead, PCV ECHOCARDIOGRAM COMPLETE, CANCELED: POCT INR   PCP EKG 12/03/2018: Normal sinus rhythm at 98 bpm, left axis deviation, left anterior fasicular block. No evidence of ischemia.  EKG  12/05/2018: Probable Sinus bradycardia at 52 bpm, left axis deviation, left anterior fasicular block, no evidence of ischemia. Normal QT interval. Baseline artifact. Compared to previous EKG in Dec 2019, bradycardia is new.   Recommendations:   Patient was seen for acute visit today for tachycardia that was recently noted at physical therapy and had his PCP office.  He was started on atenolol 12.5 milligrams daily.  He is tolerating this well and heart rate has significantly improved.  He is bradycardic today, but is asymptomatic.  We will continue with the same. He can resume PT from a cardiac standpoint.  Blood pressure is also been stable and heI will continue to monitor regularly.  He is without any complaints today, but in view of recently developing tachycardia, we will move up his previously scheduled echocardiogram to be performed in the next few weeks and also for surveillance of aortic stenosis.  Patient has had history of syncope over the last several years that has been unexplained.  He has not had any episodes over the last 1 year.  I have again discussed options of further evaluation with loop recorder implantation, but patient is at this time.  I will see him back in approximately 6 weeks for follow-up to discuss echocardiogram results.  Miquel Dunn, MSN, APRN, FNP-C [facility]. [place] Office: [phone] Fax: [phone]

## 2018-12-10 ENCOUNTER — Other Ambulatory Visit: Payer: Self-pay

## 2018-12-10 ENCOUNTER — Encounter (HOSPITAL_COMMUNITY): Payer: Self-pay | Admitting: Physical Therapy

## 2018-12-10 ENCOUNTER — Ambulatory Visit (HOSPITAL_COMMUNITY): Payer: Medicare Other | Admitting: Physical Therapy

## 2018-12-10 DIAGNOSIS — M545 Low back pain, unspecified: Secondary | ICD-10-CM

## 2018-12-10 DIAGNOSIS — R29898 Other symptoms and signs involving the musculoskeletal system: Secondary | ICD-10-CM

## 2018-12-10 NOTE — Therapy (Signed)
[place], [place], [address] Phone: [phone]   Fax:  [phone]  Physical Therapy Treatment / Re-assessment / Discharge  Patient Details  Name: Henry [name] MRN: [id number] Date of Birth: 09-Nov-1934 Referring Provider (PT): Berle Mull, MD   Encounter Date: 12/10/2018  Progress Note Reporting Period 11/12/18 to 12/10/18 See note below for Objective Data and Assessment of Progress/Goals.   PHYSICAL THERAPY DISCHARGE SUMMARY  Visits from Start of Care: 6  Current functional level related to goals / functional outcomes: See below   Remaining deficits: See below   Education / Equipment: Updated HEP   Plan: Patient agrees to discharge.  Patient goals were partially met. Patient is being discharged due to the patient's request.  ?????         PT End of Session - 12/10/18 1437    Visit Number  6    Number of Visits  8    Date for PT Re-Evaluation  12/10/18    Authorization Type  Medicare; [employer]: secondary    Authorization Time Period  11/12/18 - 12/10/18    Authorization - Visit Number  6    Authorization - Number of Visits  10    PT Start Time  6269    PT Stop Time  1500    PT Time Calculation (min)  29 min    Activity Tolerance  Patient tolerated treatment well    Behavior During Therapy  WFL for tasks assessed/performed       Past Medical History:  Diagnosis Date  . Accelerated hypertension 06/29/2014  . Arthritis    Back  . Chronic kidney disease   . Dizziness and giddiness   . Enlarged prostate   . Esophagitis 05/10/2011  . Foley catheter in place   . FOOT SPRAIN, RIGHT 07/18/2007   Qualifier: Diagnosis of  By: Aline Brochure MD, [name]    . GERD (gastroesophageal reflux disease)   . HOH (hard of hearing)   . Hx-TIA (transient ischemic attack)    hx of this- wife states 10 years ago-but Dr. Estil Daft told wife he had had several-has 2 stents in carotid artery-left  .  Hypercholesteremia   . Hypertension   . Low back pain   . Nocturia   . Stroke [facility])    had a stroke 05/23/2014  . Syncope and collapse     Past Surgical History:  Procedure Laterality Date  . carotid stents  05/2014   2 stents in carotid artery on left side  . COLONOSCOPY    . COLONOSCOPY  05/17/2011   Procedure: COLONOSCOPY;  Surgeon: [name], MD;  [facility];  Service: Endoscopy;  Laterality: N/A;  830  . epidural steroid injection  05/22/2014   had this in lumbar and at 1 am on 05/23/2014 had a stroke  . ESOPHAGOGASTRODUODENOSCOPY  10/28/2010   Procedure: ESOPHAGOGASTRODUODENOSCOPY (EGD);  Surgeon: [name], MD;  [facility];  Service: Endoscopy;  Laterality: N/A;  10:30  . ESOPHAGOGASTRODUODENOSCOPY N/A 04/24/2014   Procedure: ESOPHAGOGASTRODUODENOSCOPY (EGD);  Surgeon: [name], MD;  [facility];  Service: Endoscopy;  Laterality: N/A;  815  . ESOPHAGOGASTRODUODENOSCOPY N/A 06/26/2014   Procedure: ESOPHAGOGASTRODUODENOSCOPY (EGD);  Surgeon: [name], MD;  [facility];  Service: Endoscopy;  Laterality: N/A;  . EUS N/A 06/26/2014   Procedure: FULL UPPER ENDOSCOPIC ULTRASOUND (EUS) RADIAL;  Surgeon: [name], MD;  [facility];  Service: Endoscopy;  Laterality:  N/A;  . IR GENERIC HISTORICAL  10/12/2015   IR ANGIO VERTEBRAL SEL VERTEBRAL BILAT MOD SED 10/12/2015 Lu[facility] Bras, MD [facility]  . IR GENERIC HISTORICAL  10/12/2015   IR ANGIO INTRA EXTRACRAN SEL COM CAROTID INNOMINATE BILAT MOD SED 10/12/2015 Lu[facility] Bras, MD [facility]  . RADIOLOGY WITH ANESTHESIA N/A 05/23/2014   Procedure: RADIOLOGY WITH ANESTHESIA;  Surgeon: Medication Radiologist, MD;  Location: [place];  Service: Radiology;  Laterality: N/A;  . RESECTION DISTAL CLAVICAL Right 12/13/2012   Procedure: RESECTION DISTAL CLAVICAL;  Surgeon: Carole Civil, MD;  [facility];  Service: Orthopedics;  Laterality: Right;  .  SHOULDER ARTHROSCOPY WITH BICEPSTENOTOMY Right 12/13/2012   Procedure: SHOULDER ARTHROSCOPY WITH BICEPS TENOTOMY, limited shoulder debridement;  Surgeon: Carole Civil, MD;  [facility];  Service: Orthopedics;  Laterality: Right;  . SHOULDER SURGERY Right    torn cartiledge  . SHOULDER SURGERY     right shoulder  . TRANSURETHRAL RESECTION OF PROSTATE N/A 07/09/2014   Procedure: TRANSURETHRAL RESECTION of prostate WITH BUTTON ;  Surgeon: [name], MD;  [facility];  Service: Urology;  Laterality: N/A;    There were no vitals filed for this visit.  Subjective Assessment - 12/10/18 1529    Subjective  Patient reported having 7/10 pain. He stated he would like to be done with therapy. Reported that some of the exercises have helped.    Patient Stated Goals  Be able to walk and do exercises    Currently in Pain?  Yes    Pain Score  7          [facility] PT Assessment - 12/10/18 0001      Assessment   Medical Diagnosis  Lumbar Spondylosis, Leg strain    Referring Provider (PT)  Berle Mull, MD    Onset Date/Surgical Date  11/08/18    Prior Therapy  Yes, following stroke      Prior Function   Level of Independence  Independent;Independent with basic ADLs      Cognition   Overall Cognitive Status  Within Functional Limits for tasks assessed      Observation/Other Assessments   Focus on Therapeutic Outcomes (FOTO)   44% limited   was 46% limited     AROM   Lumbar Flexion  50% limited not painful    was 75% limited and painful    Lumbar Extension  50% limited; no pain    Lumbar - Right Side Bend  WFL; slight pain    Lumbar - Left Side Bend  WFL; slight pain    Lumbar - Right Rotation  WFL    Lumbar - Left Rotation  WFL      Strength   Right Hip Flexion  4+/5   was 4   Right Hip Extension  5/5    Left Hip Flexion  4+/5   was 4+   Left Hip Extension  5/5    Right Knee Flexion  5/5    Right Knee Extension  5/5    Left Knee Flexion  5/5    Left Knee Extension   5/5    Right Ankle Dorsiflexion  5/5    Left Ankle Dorsiflexion  5/5      Flexibility   Hamstrings  50% limited      Palpation   Spinal mobility  General hypomobility thoracic and lumbar spine      Static Standing Balance   Static Standing - Balance Support  No upper extremity supported  Static Standing Balance -  Activities   Single Leg Stance - Right Leg;Single Leg Stance - Left Leg    Static Standing - Comment/# of Minutes  2 seconds each LE                             PT Short Term Goals - 12/10/18 1534      PT SHORT TERM GOAL #1   Title  Patient will report understanding and regular compliance with HEP to improve mobility, flexibility, and decrease pain.    Time  2    Period  Weeks    Status  Achieved    Target Date  11/26/18        PT Long Term Goals - 12/10/18 1535      PT LONG TERM GOAL #1   Title  Patient will report that his back pain has not exceeded a 3/10 over the course of a 1 week period indicating improved tolerance to daily activities.    Baseline  12/10/18: 7/10 pain    Time  4    Period  Weeks    Status  Not Met      PT LONG TERM GOAL #2   Title  Patient will report ability to ambulate for at least 20 minutes without onset of pain in order to return to walking program and ability to perform grocery shopping    Baseline  12/10/18: walks for 12 minutes    Time  4    Period  Weeks    Status  Partially Met      PT LONG TERM GOAL #3   Title  Patient will perform all lumbar AROM with minimal to no pain, indicating decreased irritability of pain for improved tolerance to mobility.    Baseline  12/10/18: Some increased pain see objectives    Time  4    Period  Weeks    Status  Partially Met            Plan - 12/10/18 1539    Clinical Impression Statement  Patient returned to therapy following break due to cardiovascular concerns. Patient's HR was monitored this session and found to be 64 BPM at rest. Performed  re-assessment with patient achieving short term goal, and partially meeting some, but not all long term goals. Patient requested discharge from therapy at this time. Provided patient with HEP to address continued deficits. Educated patient that he would be discharge and that he would need a new referral from a physician if he wished to return to therapy in the future. Patient gave consent to this plan.    Personal Factors and Comorbidities  Age;Comorbidity 3+    Comorbidities  History of stroke, HTN, hyperlipidemia    Examination-Activity Limitations  Stand;Bend;Locomotion Level;Transfers;Squat    Examination-Participation Restrictions  Meal Prep;Community Activity    Stability/Clinical Decision Making  Stable/Uncomplicated    Clinical Decision Making  Low    Rehab Potential  Good    PT Frequency  2x / week    PT Duration  4 weeks    PT Treatment/Interventions  ADLs/Self Care Home Management;Electrical Stimulation;Cryotherapy;Moist Heat;Traction;DME Instruction;Gait training;Stair training;Functional mobility training;Therapeutic activities;Therapeutic exercise;Balance training;Neuromuscular re-education;Patient/family education;Orthotic Fit/Training;Manual techniques;Passive range of motion;Dry needling;Energy conservation;Taping    PT Next Visit Plan  Discharged    PT Home Exercise Plan  11/12/18: Seated hamstring stretch 3x30'' each LE; 11/19/18: LTR, SKTC, hamstring stretch in supine position (modified positon).; 12/10/18: Forward trunk flexion daily  Consulted and Agree with Plan of Care  Patient       Patient will benefit from skilled therapeutic intervention in order to improve the following deficits and impairments:  Abnormal gait, Increased fascial restricitons, Improper body mechanics, Pain, Decreased mobility, Postural dysfunction, Decreased activity tolerance, Decreased endurance, Decreased range of motion, Decreased strength, Hypomobility, Decreased balance, Difficulty walking, Impaired  flexibility  Visit Diagnosis: Acute low back pain, unspecified back pain laterality, unspecified whether sciatica present  Other symptoms and signs involving the musculoskeletal system     Problem List Patient Active Problem List   Diagnosis Date Noted  . Syncope 11/02/2017  . Syncope and collapse 03/08/2015  . Orthostatic hypotension 03/08/2015  . Accelerated hypertension 08/28/2014  . BPH (benign prostatic hypertrophy) with urinary obstruction 07/09/2014  . Right leg swelling 07/06/2014  . UTI (urinary tract infection) 07/06/2014  . ARF (acute renal failure) as a complication of the indwelling Foley 07/05/2014  . Cerebral infarction due to embolism of left middle cerebral artery ([place]) 06/29/2014  . Carotid stenosis 06/29/2014  . Urinary retention 06/29/2014  . Hematuria 06/29/2014  . Aphasia S/P CVA 05/28/2014  . Dysphagia, pharyngoesophageal phase 05/28/2014  . Left middle cerebral artery stroke ([place]) 05/26/2014  . Coarctation of aorta, recurrent, post-intervention   . Expressive aphasia   . Acute right-sided weakness   . Stroke ([place]) 05/23/2014  . S/P shoulder surgery 12/24/2012  . S/P arthroscopy of shoulder 12/16/2012  . Shoulder arthritis 12/13/2012  . Biceps tendon rupture, proximal 12/13/2012  . Partial tear of right subscapularis tendon 12/13/2012  . Hypertension 05/10/2011  . Hyperlipidemia 05/10/2011  . Barrett esophagus 05/10/2011   Clarene Critchley PT, DPT 3:43 PM, 12/10/18 [place], [place], [address] Phone: [phone]   Fax:  [phone]  Name: Henry [name] MRN: [id number] Date of Birth: 02-Apr-1934

## 2018-12-11 ENCOUNTER — Ambulatory Visit (INDEPENDENT_AMBULATORY_CARE_PROVIDER_SITE_OTHER): Payer: Medicare Other

## 2018-12-11 DIAGNOSIS — I350 Nonrheumatic aortic (valve) stenosis: Secondary | ICD-10-CM

## 2018-12-12 ENCOUNTER — Encounter (HOSPITAL_COMMUNITY): Payer: Medicare Other | Admitting: Physical Therapy

## 2018-12-19 ENCOUNTER — Other Ambulatory Visit: Payer: Self-pay

## 2018-12-19 ENCOUNTER — Ambulatory Visit (INDEPENDENT_AMBULATORY_CARE_PROVIDER_SITE_OTHER): Payer: Medicare Other | Admitting: Cardiology

## 2018-12-19 ENCOUNTER — Encounter: Payer: Self-pay | Admitting: Cardiology

## 2018-12-19 VITALS — BP 142/57 | HR 61 | Ht 67.0 in | Wt 242.4 lb

## 2018-12-19 DIAGNOSIS — I10 Essential (primary) hypertension: Secondary | ICD-10-CM

## 2018-12-19 DIAGNOSIS — R55 Syncope and collapse: Secondary | ICD-10-CM | POA: Diagnosis not present

## 2018-12-19 DIAGNOSIS — I350 Nonrheumatic aortic (valve) stenosis: Secondary | ICD-10-CM

## 2018-12-19 NOTE — Progress Notes (Signed)
Primary Physician:  Leanna Battles, MD   Patient ID: [name], male    DOB: 1934-08-29, 84 y.o.   MRN: [id number]  Subjective:    Chief Complaint  Patient presents with  . Tachycardia  . Follow-up    HPI: MARVIS MILLISON  is a 84 y.o. male  with history of hypertension, hyperlipidemia, hyperglycemia and asymptomatic carotid disease with history of left carotid angioplasty in 2016 with history of CVA in April 2016. He has history of recurrent syncope, last episode in September 2019 and has been attributed to vasovagal episodes.   Patient is here for echo follow up. He had recently been seen by [place] for acute visit after being noted to be tachycardia during PT and also in PT office. He had previously been on atenolol, but this had been discontinued sometime in the past due to bradycardia. Due to tachycardia, I had advised starting atenolol at low dose, 12.5 mg daily. Since being on this heart rate has been stable in the 50-60 range. He is tolerating this well without dizziness.   He continues to walk 3-4 times a day that he tolerates well. No chest pain or shortness of breath. Feels good with walking.    Past Medical History:  Diagnosis Date  . Accelerated hypertension 06/29/2014  . Arthritis    Back  . Chronic kidney disease   . Dizziness and giddiness   . Enlarged prostate   . Esophagitis 05/10/2011  . Foley catheter in place   . FOOT SPRAIN, RIGHT 07/18/2007   Qualifier: Diagnosis of  By: Aline Brochure MD, [name]    . GERD (gastroesophageal reflux disease)   . HOH (hard of hearing)   . Hx-TIA (transient ischemic attack)    hx of this- wife states 10 years ago-but Dr. Estil Daft told wife he had had several-has 2 stents in carotid artery-left  . Hypercholesteremia   . Hypertension   . Low back pain   . Nocturia   . Stroke [facility])    had a stroke 05/23/2014  . Syncope and collapse     Past Surgical History:  Procedure Laterality Date  . carotid stents  05/2014   2  stents in carotid artery on left side  . COLONOSCOPY    . COLONOSCOPY  05/17/2011   Procedure: COLONOSCOPY;  Surgeon: [name], MD;  [facility];  Service: Endoscopy;  Laterality: N/A;  830  . epidural steroid injection  05/22/2014   had this in lumbar and at 1 am on 05/23/2014 had a stroke  . ESOPHAGOGASTRODUODENOSCOPY  10/28/2010   Procedure: ESOPHAGOGASTRODUODENOSCOPY (EGD);  Surgeon: [name], MD;  [facility];  Service: Endoscopy;  Laterality: N/A;  10:30  . ESOPHAGOGASTRODUODENOSCOPY N/A 04/24/2014   Procedure: ESOPHAGOGASTRODUODENOSCOPY (EGD);  Surgeon: [name], MD;  [facility];  Service: Endoscopy;  Laterality: N/A;  815  . ESOPHAGOGASTRODUODENOSCOPY N/A 06/26/2014   Procedure: ESOPHAGOGASTRODUODENOSCOPY (EGD);  Surgeon: [name], MD;  Location[facility];  Service: Endoscopy;  Laterality: N/A;  . EUS N/A 06/26/2014   Procedure: FULL UPPER ENDOSCOPIC ULTRASOUND (EUS) RADIAL;  Surgeon: [name], MD;  Location: [facility];  Service: Endoscopy;  Laterality: N/A;  . IR GENERIC HISTORICAL  10/12/2015   IR ANGIO VERTEBRAL SEL VERTEBRAL BILAT MOD SED 10/12/2015 Luanne Bras, MD [facility]  . IR GENERIC HISTORICAL  10/12/2015   IR ANGIO INTRA EXTRACRAN SEL COM CAROTID INNOMINATE BILAT MOD SED 10/12/2015 Luanne Bras, MD [facility]  .  RADIOLOGY WITH ANESTHESIA N/A 05/23/2014   Procedure: RADIOLOGY WITH ANESTHESIA;  Surgeon: Medication Radiologist, MD;  Location: [place];  Service: Radiology;  Laterality: N/A;  . RESECTION DISTAL CLAVICAL Right 12/13/2012   Procedure: RESECTION DISTAL CLAVICAL;  Surgeon: Carole Civil, MD;  Location: [facility];  Service: Orthopedics;  Laterality: Right;  . SHOULDER ARTHROSCOPY WITH BICEPSTENOTOMY Right 12/13/2012   Procedure: SHOULDER ARTHROSCOPY WITH BICEPS TENOTOMY, limited shoulder debridement;  Surgeon: Carole Civil, MD;  Location: [facility];  Service: Orthopedics;  Laterality:  Right;  . SHOULDER SURGERY Right    torn cartiledge  . SHOULDER SURGERY     right shoulder  . TRANSURETHRAL RESECTION OF PROSTATE N/A 07/09/2014   Procedure: TRANSURETHRAL RESECTION of prostate WITH BUTTON ;  Surgeon: [name], MD;  Location: [facility];  Service: Urology;  Laterality: N/A;    Social History   Socioeconomic History  . Marital status: Married    Spouse name: Not on file  . Number of children: 4  . Years of education: 12  . Highest education level: Not on file  Occupational History  . Occupation: retired    Comment: [employer]  Social Needs  . Financial resource strain: Not on file  . Food insecurity    Worry: Not on file    Inability: Not on file  . Transportation needs    Medical: Not on file    Non-medical: Not on file  Tobacco Use  . Smoking status: Former Smoker    Packs/day: 0.50    Years: 35.00    Pack years: 17.50    Types: Cigarettes    Quit date: 1986    Years since quitting: 34.8  . Smokeless tobacco: Never Used  Substance and Sexual Activity  . Alcohol use: Yes    Alcohol/week: 3.0 standard drinks    Types: 3 Glasses of wine per week  . Drug use: No  . Sexual activity: Yes    Birth control/protection: None  Lifestyle  . Physical activity    Days per week: Not on file    Minutes per session: Not on file  . Stress: Not on file  Relationships  . Social Herbalist on phone: Not on file    Gets together: Not on file    Attends religious service: Not on file    Active member of club or organization: Not on file    Attends meetings of clubs or organizations: Not on file    Relationship status: Not on file  . Intimate partner violence    Fear of current or ex partner: Not on file    Emotionally abused: Not on file    Physically abused: Not on file    Forced sexual activity: Not on file  Other Topics Concern  . Not on file  Social History Narrative   Married, retired - [employer]   Right handed   Caffeine use - 3-4 cups coffee  daily    Review of Systems  Constitution: Negative for decreased appetite, malaise/fatigue, weight gain and weight loss.  Eyes: Negative for visual disturbance.  Cardiovascular: Negative for chest pain, claudication, dyspnea on exertion, leg swelling, orthopnea, palpitations and syncope.  Respiratory: Negative for hemoptysis and wheezing.   Endocrine: Negative for cold intolerance and heat intolerance.  Hematologic/Lymphatic: Does not bruise/bleed easily.  Skin: Negative for nail changes.  Musculoskeletal: Positive for back pain. Negative for muscle weakness and myalgias.  Gastrointestinal: Negative for abdominal pain, change in bowel habit, nausea and vomiting.  Neurological: Positive for focal weakness (right, residual from CVA). Negative for difficulty with concentration, dizziness and headaches.  Psychiatric/Behavioral: Negative for altered mental status and suicidal ideas.  All other systems reviewed and are negative.     Objective:  Blood pressure (!) 142/57, pulse 61, height 5\' 7"  (1.702 m), weight 242 lb 6.4 oz (110 kg), SpO2 100 %. Body mass index is 37.97 kg/m.    Physical Exam  Constitutional: He is oriented to person, place, and time. Vital signs are normal. He appears well-developed and well-nourished.  HENT:  Head: Normocephalic and atraumatic.  Neck: Normal range of motion.  Cardiovascular: Normal rate, regular rhythm and intact distal pulses.  Murmur heard.  Early systolic murmur is present with a grade of 2/6 radiating to the neck. Pulses:      Carotid pulses are on the right side with bruit and on the left side with bruit. trace edema  Pulmonary/Chest: Effort normal and breath sounds normal. No accessory muscle usage. No respiratory distress.  Abdominal: Soft. Bowel sounds are normal.  obese  Musculoskeletal: Normal range of motion.  Neurological: He is alert and oriented to person, place, and time.  Skin: Skin is warm and dry.  Vitals reviewed.  Radiology:  No results found.  Laboratory examination:    CMP Latest Ref Rng & Units 11/03/2017 11/02/2017 10/12/2015  Glucose 70 - 99 mg/dL 105(H) 105(H) 100(H)  BUN 8 - 23 mg/dL 14 19 13   Creatinine 0.61 - 1.24 mg/dL 1.02 1.28(H) 0.96  Sodium 135 - 145 mmol/L 135 132(L) 139  Potassium 3.5 - 5.1 mmol/L 4.2 3.1(L) 3.6  Chloride 98 - 111 mmol/L 104 99 105  CO2 22 - 32 mmol/L 26 26 26   Calcium 8.9 - 10.3 mg/dL 8.5(L) 8.7(L) 9.2  Total Protein 6.5 - 8.1 g/dL 6.2(L) 6.9 -  Total Bilirubin 0.3 - 1.2 mg/dL 0.8 0.6 -  Alkaline Phos 38 - 126 U/L 35(L) 37(L) -  AST 15 - 41 U/L 25 23 -  ALT 0 - 44 U/L 19 20 -   CBC Latest Ref Rng & Units 11/03/2017 11/02/2017 10/12/2015  WBC 4.0 - 10.5 K/uL 8.6 11.6(H) 7.2  Hemoglobin 13.0 - 17.0 g/dL 12.9(L) 13.2 13.4  Hematocrit 39.0 - 52.0 % 38.8(L) 40.0 42.0  Platelets 150 - 400 K/uL 210 222 263   Lipid Panel     Component Value Date/Time   CHOL 110 05/24/2014 0354   TRIG 79 05/24/2014 0354   HDL 35 (L) 05/24/2014 0354   CHOLHDL 3.1 05/24/2014 0354   VLDL 16 05/24/2014 0354   LDLCALC 59 05/24/2014 0354   HEMOGLOBIN A1C Lab Results  Component Value Date   HGBA1C 6.2 (H) 05/24/2014   MPG 131 05/24/2014   TSH No results for input(s): TSH in the last 8760 hours.  PRN Meds:. Medications Discontinued During This Encounter  Medication Reason  . omeprazole (PRILOSEC) 40 MG capsule Error  . omega-3 acid ethyl esters (LOVAZA) 1 g capsule Error  . Omega-3 Fatty Acids (FISH OIL) 1000 MG CAPS Error   Current Meds  Medication Sig  . aspirin 81 MG tablet Take 81 mg by mouth at bedtime.   Marland Kitchen atenolol (TENORMIN) 25 MG tablet Take 12.5 mg by mouth daily.  Marland Kitchen atorvastatin (LIPITOR) 40 MG tablet Take 1 tablet (40 mg total) by mouth every evening.  . chlorthalidone (HYGROTON) 25 MG tablet Take 25 mg by mouth every morning.   . Cholecalciferol (VITAMIN D PO) Take 1 capsule by mouth every evening.  Marland Kitchen  clopidogrel (PLAVIX) 75 MG tablet TAKE 1 TABLET DAILY (Patient taking  differently: Take 75 mg by mouth every morning. )  . finasteride (PROSCAR) 5 MG tablet Take 1 tablet (5 mg total) by mouth daily. (Patient taking differently: Take 5 mg by mouth every evening. )  . furosemide (LASIX) 20 MG tablet Take 20 mg by mouth.  . hydrALAZINE (APRESOLINE) 25 MG tablet TAKE 1 TABLET THREE TIMES A DAY  . olmesartan (BENICAR) 40 MG tablet TAKE 1 TABLET DAILY  . pantoprazole (PROTONIX) 40 MG tablet Take 40 mg by mouth daily.  . polyethylene glycol (MIRALAX / GLYCOLAX) packet Take 17 g by mouth every morning.  . [DISCONTINUED] omega-3 acid ethyl esters (LOVAZA) 1 g capsule Take 1 capsule (1 g total) by mouth daily.  . [DISCONTINUED] Omega-3 Fatty Acids (FISH OIL) 1000 MG CAPS Take 1,000 mg by mouth every morning.  . [DISCONTINUED] omeprazole (PRILOSEC) 40 MG capsule Take 40 mg by mouth every morning.     Cardiac Studies:   Echocardiogram 12/11/2018: Left ventricle cavity is normal in size. Mild concentric hypertrophy of the left ventricle. Normal global wall motion. Normal LV systolic function with EF 60%. Doppler evidence of grade I (impaired) diastolic dysfunction, normal LAP.  Trileaflet aortic valve with mild calcification of the aortic valve annulus and leaflets. Mild aortic valve stenosis.  No regurgitation noted. Aortic valve mean gradient of 8 mmHg, Vmax of 2.0 m/s. Calculated aortic valve area by continuity equation is 1.6 cm. Mild (Grade I) mitral regurgitation.  Mild tricuspid regurgitation. No evidence of pulmonary hypertension. No significant change compares to previous study on 05/25/2016.  Event Monitor 30 days 02/19/2015: NSR, No symptoms reported. No A. Fib or heart block.  Lexiscan myoview stress test 02/26/2015: 1. The resting electrocardiogram demonstrated normal sinus rhythm, incomplete RBBB and no resting arrhythmias. Stress EKG is non-diagnostic for ischemia as it a pharmacologic stress using Lexiscan.Stress symptoms included dyspnea. 2. The  perfusion imaging study demonstrates a very small sized inferolateral and inferoapical subtle ischemia. LVEF was normal without regional wall motion at 77%. This represents a low risk study.  Assessment:   Mild aortic stenosis  Primary hypertension  Syncope and collapse   PCP EKG 12/03/2018: Normal sinus rhythm at 98 bpm, left axis deviation, left anterior fasicular block. No evidence of ischemia.  EKG 12/05/2018: Probable Sinus bradycardia at 52 bpm, left axis deviation, left anterior fasicular block, no evidence of ischemia. Normal QT interval. Baseline artifact. Compared to previous EKG in Dec 2019, bradycardia is new.   Recommendations:   Patient is here for follow-up to discuss recent echocardiogram.  Echocardiogram remains stable compared to previous echo in 2018.  Has been mild aortic stenosis that has been stable.  He had recently been seen for visit for tachycardia noted in the office and at physical therapy does now resolved being on low-dose atenolol he does have bradycardia is asymptomatic.  He is feeling well, will continue with his current medications.  I am unable to explain the reason that he still he developed tachycardia.  No symptoms of angina.  He has been walking 3 to 4 days a week that he tolerates well.  I urged him to continue with this.  Pressure was slightly elevated in office today, but on high blood pressure log has been very well controlled.  He is to see Dr. Sharlett Iles in the next few weeks for annual physical.  He has a history of syncope for the last several years that has been unexplained.  I had previously discussed loop recorder indication; however, patient wishes to hold off on this.  He has not had a syncopal episode over the last 1 year.  We will plan to see back in 6 months, but encouraged to contact soon for any dizziness, recurrent syncope, or recurrence of tachycardia.   Miquel Dunn, MSN, APRN, FNP-C [facility]. [place] Office:  [phone] Fax: [phone]

## 2018-12-25 DIAGNOSIS — M545 Low back pain: Secondary | ICD-10-CM | POA: Diagnosis not present

## 2018-12-25 DIAGNOSIS — M9902 Segmental and somatic dysfunction of thoracic region: Secondary | ICD-10-CM | POA: Diagnosis not present

## 2018-12-25 DIAGNOSIS — M546 Pain in thoracic spine: Secondary | ICD-10-CM | POA: Diagnosis not present

## 2018-12-25 DIAGNOSIS — M9905 Segmental and somatic dysfunction of pelvic region: Secondary | ICD-10-CM | POA: Diagnosis not present

## 2018-12-25 DIAGNOSIS — M9903 Segmental and somatic dysfunction of lumbar region: Secondary | ICD-10-CM | POA: Diagnosis not present

## 2018-12-27 DIAGNOSIS — Z125 Encounter for screening for malignant neoplasm of prostate: Secondary | ICD-10-CM | POA: Diagnosis not present

## 2018-12-27 DIAGNOSIS — E7849 Other hyperlipidemia: Secondary | ICD-10-CM | POA: Diagnosis not present

## 2019-01-03 DIAGNOSIS — I10 Essential (primary) hypertension: Secondary | ICD-10-CM | POA: Diagnosis not present

## 2019-01-03 DIAGNOSIS — R82998 Other abnormal findings in urine: Secondary | ICD-10-CM | POA: Diagnosis not present

## 2019-01-14 ENCOUNTER — Other Ambulatory Visit: Payer: Self-pay

## 2019-01-14 DIAGNOSIS — Z20822 Contact with and (suspected) exposure to covid-19: Secondary | ICD-10-CM

## 2019-01-14 DIAGNOSIS — Z20828 Contact with and (suspected) exposure to other viral communicable diseases: Secondary | ICD-10-CM | POA: Diagnosis not present

## 2019-01-16 LAB — NOVEL CORONAVIRUS, NAA: SARS-CoV-2, NAA: NOT DETECTED

## 2019-01-29 ENCOUNTER — Ambulatory Visit: Payer: Self-pay | Admitting: Cardiology

## 2019-02-06 DIAGNOSIS — Z1212 Encounter for screening for malignant neoplasm of rectum: Secondary | ICD-10-CM | POA: Diagnosis not present

## 2019-03-05 ENCOUNTER — Ambulatory Visit: Payer: Medicare Other | Attending: Internal Medicine

## 2019-03-05 DIAGNOSIS — Z23 Encounter for immunization: Secondary | ICD-10-CM | POA: Insufficient documentation

## 2019-03-05 NOTE — Progress Notes (Signed)
   Covid-19 Vaccination Clinic  Name:  Henry [name]    MRN: [id number] DOB: Sep 11, 1934  03/05/2019  [name] was observed post Covid-19 immunization for 15 minutes without incidence. He was provided with Vaccine Information Sheet and instruction to access the V-Safe system.   [name] was instructed to call 911 with any severe reactions post vaccine: Marland Kitchen Difficulty breathing  . Swelling of your face and throat  . A fast heartbeat  . A bad rash all over your body  . Dizziness and weakness    Immunizations Administered    Name Date Dose VIS Date Route   Pfizer COVID-19 Vaccine 03/05/2019  9:22 AM 0.3 mL 01/24/2019 Intramuscular   Manufacturer: [employer], [employer]   Lot: [id number]   [place]: [id number]

## 2019-03-12 DIAGNOSIS — M9902 Segmental and somatic dysfunction of thoracic region: Secondary | ICD-10-CM | POA: Diagnosis not present

## 2019-03-12 DIAGNOSIS — M9905 Segmental and somatic dysfunction of pelvic region: Secondary | ICD-10-CM | POA: Diagnosis not present

## 2019-03-12 DIAGNOSIS — M546 Pain in thoracic spine: Secondary | ICD-10-CM | POA: Diagnosis not present

## 2019-03-12 DIAGNOSIS — M9903 Segmental and somatic dysfunction of lumbar region: Secondary | ICD-10-CM | POA: Diagnosis not present

## 2019-03-12 DIAGNOSIS — M545 Low back pain: Secondary | ICD-10-CM | POA: Diagnosis not present

## 2019-03-23 ENCOUNTER — Ambulatory Visit: Payer: Medicare Other | Attending: Internal Medicine

## 2019-03-23 DIAGNOSIS — Z23 Encounter for immunization: Secondary | ICD-10-CM | POA: Insufficient documentation

## 2019-03-23 NOTE — Progress Notes (Signed)
   Covid-19 Vaccination Clinic  Name:  Henry [name]    MRN: [id number] DOB: 06-01-1934  03/23/2019  [name] was observed post Covid-19 immunization for 15 minutes without incidence. He was provided with Vaccine Information Sheet and instruction to access the V-Safe system.   [name] was instructed to call 911 with any severe reactions post vaccine: Marland Kitchen Difficulty breathing  . Swelling of your face and throat  . A fast heartbeat  . A bad rash all over your body  . Dizziness and weakness    Immunizations Administered    Name Date Dose VIS Date Route   Pfizer COVID-19 Vaccine 03/23/2019  9:25 AM 0.3 mL 01/24/2019 Intramuscular   Manufacturer: [place]   Lot: [id number]   [place]: [id number]

## 2019-04-16 ENCOUNTER — Telehealth (HOSPITAL_COMMUNITY): Payer: Self-pay

## 2019-04-16 ENCOUNTER — Other Ambulatory Visit (HOSPITAL_COMMUNITY): Payer: Self-pay | Admitting: Interventional Radiology

## 2019-04-16 DIAGNOSIS — I63412 Cerebral infarction due to embolism of left middle cerebral artery: Secondary | ICD-10-CM

## 2019-04-16 DIAGNOSIS — I6523 Occlusion and stenosis of bilateral carotid arteries: Secondary | ICD-10-CM

## 2019-04-16 NOTE — Telephone Encounter (Signed)
Called to schedule us carotid, no answer, left vm. AW  

## 2019-04-21 DIAGNOSIS — M546 Pain in thoracic spine: Secondary | ICD-10-CM | POA: Diagnosis not present

## 2019-04-21 DIAGNOSIS — M9905 Segmental and somatic dysfunction of pelvic region: Secondary | ICD-10-CM | POA: Diagnosis not present

## 2019-04-21 DIAGNOSIS — M9902 Segmental and somatic dysfunction of thoracic region: Secondary | ICD-10-CM | POA: Diagnosis not present

## 2019-04-21 DIAGNOSIS — M9903 Segmental and somatic dysfunction of lumbar region: Secondary | ICD-10-CM | POA: Diagnosis not present

## 2019-04-21 DIAGNOSIS — M545 Low back pain: Secondary | ICD-10-CM | POA: Diagnosis not present

## 2019-04-22 ENCOUNTER — Other Ambulatory Visit: Payer: Self-pay | Admitting: Cardiology

## 2019-04-28 ENCOUNTER — Other Ambulatory Visit: Payer: Self-pay

## 2019-04-28 ENCOUNTER — Ambulatory Visit (HOSPITAL_COMMUNITY)
Admission: RE | Admit: 2019-04-28 | Discharge: 2019-04-28 | Disposition: A | Discharge status: Home / Self Care | Payer: Medicare Other | Source: Ambulatory Visit | Attending: Interventional Radiology | Admitting: Interventional Radiology

## 2019-04-28 DIAGNOSIS — I6523 Occlusion and stenosis of bilateral carotid arteries: Secondary | ICD-10-CM | POA: Diagnosis not present

## 2019-04-28 DIAGNOSIS — I63412 Cerebral infarction due to embolism of left middle cerebral artery: Secondary | ICD-10-CM | POA: Insufficient documentation

## 2019-04-29 ENCOUNTER — Telehealth (HOSPITAL_COMMUNITY): Payer: Self-pay

## 2019-04-29 NOTE — Telephone Encounter (Signed)
Pt agreed to f/u in 6 months with us carotid. AW 

## 2019-05-21 DIAGNOSIS — M546 Pain in thoracic spine: Secondary | ICD-10-CM | POA: Diagnosis not present

## 2019-05-21 DIAGNOSIS — M9902 Segmental and somatic dysfunction of thoracic region: Secondary | ICD-10-CM | POA: Diagnosis not present

## 2019-05-21 DIAGNOSIS — M9905 Segmental and somatic dysfunction of pelvic region: Secondary | ICD-10-CM | POA: Diagnosis not present

## 2019-05-21 DIAGNOSIS — M545 Low back pain: Secondary | ICD-10-CM | POA: Diagnosis not present

## 2019-05-21 DIAGNOSIS — M9903 Segmental and somatic dysfunction of lumbar region: Secondary | ICD-10-CM | POA: Diagnosis not present

## 2019-05-23 DIAGNOSIS — B029 Zoster without complications: Secondary | ICD-10-CM | POA: Diagnosis not present

## 2019-06-11 ENCOUNTER — Telehealth: Payer: Self-pay

## 2019-06-11 NOTE — Telephone Encounter (Signed)
[name] called and said pt has been having very low blood Pressure readings 80/40 HR = 56. Pt feels very sleepy and tired, dizzy. Pt has appointment with you next week.

## 2019-06-11 NOTE — Telephone Encounter (Signed)
Patient may hold his atenolol for now given his recent blood pressures and his symptoms.However, he has been on the atenolol for some time now I doubt this is the underlying factor.  Please inform the patient to be cognizant of any other causes for his symptoms.  Continue to keep an eye on the blood pressure and heart rate.

## 2019-06-13 DIAGNOSIS — M9903 Segmental and somatic dysfunction of lumbar region: Secondary | ICD-10-CM | POA: Diagnosis not present

## 2019-06-13 DIAGNOSIS — M9905 Segmental and somatic dysfunction of pelvic region: Secondary | ICD-10-CM | POA: Diagnosis not present

## 2019-06-13 DIAGNOSIS — M546 Pain in thoracic spine: Secondary | ICD-10-CM | POA: Diagnosis not present

## 2019-06-13 DIAGNOSIS — M545 Low back pain: Secondary | ICD-10-CM | POA: Diagnosis not present

## 2019-06-13 DIAGNOSIS — M9902 Segmental and somatic dysfunction of thoracic region: Secondary | ICD-10-CM | POA: Diagnosis not present

## 2019-06-17 ENCOUNTER — Ambulatory Visit: Payer: TRICARE For Life (TFL) | Admitting: Cardiology

## 2019-06-19 ENCOUNTER — Ambulatory Visit: Payer: Medicare Other | Admitting: Cardiology

## 2019-06-19 ENCOUNTER — Encounter: Payer: Self-pay | Admitting: Cardiology

## 2019-06-19 ENCOUNTER — Other Ambulatory Visit: Payer: Self-pay

## 2019-06-19 VITALS — BP 155/69 | HR 68 | Ht 67.0 in | Wt 243.0 lb

## 2019-06-19 DIAGNOSIS — E782 Mixed hyperlipidemia: Secondary | ICD-10-CM | POA: Diagnosis not present

## 2019-06-19 DIAGNOSIS — Z87891 Personal history of nicotine dependence: Secondary | ICD-10-CM | POA: Diagnosis not present

## 2019-06-19 DIAGNOSIS — Z8673 Personal history of transient ischemic attack (TIA), and cerebral infarction without residual deficits: Secondary | ICD-10-CM | POA: Diagnosis not present

## 2019-06-19 DIAGNOSIS — Z6838 Body mass index (BMI) 38.0-38.9, adult: Secondary | ICD-10-CM

## 2019-06-19 DIAGNOSIS — I10 Essential (primary) hypertension: Secondary | ICD-10-CM | POA: Diagnosis not present

## 2019-06-19 DIAGNOSIS — I6522 Occlusion and stenosis of left carotid artery: Secondary | ICD-10-CM

## 2019-06-19 NOTE — Progress Notes (Signed)
Henry [name] Date of Birth: 06/13/1934 MRN: [id number] Primary Care Provider:[place], Henry Quince, [name] Former Cardiology Providers: Henry Lager, APRN, FNP-C Primary Cardiologist: Henry Kras, DO, [facility] (established care 06/19/2019)  Date: 06/19/19 Last Office Visit: 12/19/2018  Chief Complaint  Patient presents with  . Tachycardia  . Results    HPI  Henry [name] is a 85 y.o.  male who presents to the office with a chief complaint of " follow-up regarding hypertension and tachycardia." Patient's past medical history and cardiovascular risk factors include: Hyperlipidemia, hypertension, hyperglycemia, history of carotid artery disease status post stenting, history of CVA in 2016, history of syncope, advanced age, obesity due to excess calories.  Patient is accompanied by his wife at today's office visit.  Last seen in the office by Henry Lager, APRN, FNP-C and here to  reestablish care with myself as of today.  At last office visit patient was seen on a more urgent basis secondary to tachycardia that was noted during his physical therapy appointment.  He was restarted on atenolol which was discontinued in the past due to bradycardia.  Patient did well on atenolol until recently when he called the office in April 2021 stating that he was having hypotension and feeling weak and " bad."  Patient has held his atenolol since June 11, 2019 and states that now he feels better.  Patient brought his blood pressure log in for review.  Patient systolic blood pressures have been consistently between [id number] mmHg and diastolic blood pressures in the 80 mmHg range.  Patient states that his blood pressure is currently managed by his primary care physician.  No hospitalizations or urgent care visits since last time he was seen in the office.  No active chest pain or anginal equivalent.  Continues to have the same functional status that he had at the last office visit.  Most recent carotid duplex  results reviewed with the patient and his wife at today's visit.  And noted below for further reference  FUNCTIONAL STATUS: He walks 1.5 - 2 miles three times a week.    ALLERGIES: No Known Allergies   MEDICATION LIST PRIOR TO VISIT: Current Outpatient Medications on File Prior to Visit  Medication Sig Dispense Refill  . ALPRAZolam (XANAX) 0.25 MG tablet Take 0.25 mg by mouth at bedtime as needed.    Marland Kitchen aspirin EC 81 MG tablet Take 81 mg by mouth daily.    Marland Kitchen atorvastatin (LIPITOR) 40 MG tablet Take 1 tablet (40 mg total) by mouth every evening. 30 tablet 1  . Cholecalciferol (VITAMIN D PO) Take 1 capsule by mouth every evening.    . clopidogrel (PLAVIX) 75 MG tablet TAKE 1 TABLET DAILY (Patient taking differently: Take 75 mg by mouth every morning. ) 90 tablet 3  . finasteride (PROSCAR) 5 MG tablet Take 1 tablet (5 mg total) by mouth daily. (Patient taking differently: Take 5 mg by mouth every evening. ) 30 tablet 11  . furosemide (LASIX) 40 MG tablet Take 20 mg by mouth in the morning.    . hydrALAZINE (APRESOLINE) 25 MG tablet TAKE 1 TABLET THREE TIMES A DAY (Patient taking differently: Take 25 mg by mouth in the morning and at bedtime. ) 270 tablet 3  . olmesartan (BENICAR) 40 MG tablet TAKE 1 TABLET DAILY 90 tablet 3  . pantoprazole (PROTONIX) 40 MG tablet Take 40 mg by mouth daily.    . polyethylene glycol (MIRALAX / GLYCOLAX) packet Take 17 g by mouth every morning.    Marland Kitchen  REFRESH CELLUVISC 1 % GEL as needed.     Marland Kitchen spironolactone (ALDACTONE) 25 MG tablet Take 25 mg by mouth daily.     No current facility-administered medications on file prior to visit.    PAST MEDICAL HISTORY: Past Medical History:  Diagnosis Date  . Accelerated hypertension 06/29/2014  . Arthritis    Back  . Chronic kidney disease   . Dizziness and giddiness   . Enlarged prostate   . Esophagitis 05/10/2011  . Foley catheter in place   . FOOT SPRAIN, RIGHT 07/18/2007   Qualifier: Diagnosis of  By: Henry [name],  Henry [name]    . GERD (gastroesophageal reflux disease)   . HOH (hard of hearing)   . Hx-TIA (transient ischemic attack)    hx of this- wife states 10 years ago-but Dr. Estil [name] told wife he had had several-has 2 stents in carotid artery-left  . Hypercholesteremia   . Hypertension   . Low back pain   . Nocturia   . Shingles   . Stroke [facility])    had a stroke 05/23/2014  . Syncope and collapse     PAST SURGICAL HISTORY: Past Surgical History:  Procedure Laterality Date  . carotid stents  05/2014   2 stents in carotid artery on left side  . COLONOSCOPY    . COLONOSCOPY  05/17/2011   Procedure: COLONOSCOPY;  Surgeon: Henry Houston, [name];  [facility];  Service: Endoscopy;  Laterality: N/A;  830  . epidural steroid injection  05/22/2014   had this in lumbar and at 1 am on 05/23/2014 had a stroke  . ESOPHAGOGASTRODUODENOSCOPY  10/28/2010   Procedure: ESOPHAGOGASTRODUODENOSCOPY (EGD);  Surgeon: Henry Houston, [name];  [facility];  Service: Endoscopy;  Laterality: N/A;  10:30  . ESOPHAGOGASTRODUODENOSCOPY N/A 04/24/2014   Procedure: ESOPHAGOGASTRODUODENOSCOPY (EGD);  Surgeon: Henry Houston, [name];  [facility];  Service: Endoscopy;  Laterality: N/A;  815  . ESOPHAGOGASTRODUODENOSCOPY N/A 06/26/2014   Procedure: ESOPHAGOGASTRODUODENOSCOPY (EGD);  Surgeon: Henry Ada, [name];  Location: [facility];  Service: Endoscopy;  Laterality: N/A;  . EUS N/A 06/26/2014   Procedure: FULL UPPER ENDOSCOPIC ULTRASOUND (EUS) RADIAL;  Surgeon: Henry Ada, [name];  Location: [facility];  Service: Endoscopy;  Laterality: N/A;  . IR GENERIC HISTORICAL  10/12/2015   IR ANGIO VERTEBRAL SEL VERTEBRAL BILAT MOD SED 10/12/2015 Henry Bras, [name] [facility]  . IR GENERIC HISTORICAL  10/12/2015   IR ANGIO INTRA EXTRACRAN SEL COM CAROTID INNOMINATE BILAT MOD SED 10/12/2015 Henry Bras, [name] [facility]  . RADIOLOGY WITH ANESTHESIA N/A 05/23/2014   Procedure: RADIOLOGY WITH ANESTHESIA;   Surgeon: Medication Radiologist, [name];  Location: [place];  Service: Radiology;  Laterality: N/A;  . RESECTION DISTAL CLAVICAL Right 12/13/2012   Procedure: RESECTION DISTAL CLAVICAL;  Surgeon: Carole Civil, [name];  Location: [facility];  Service: Orthopedics;  Laterality: Right;  . SHOULDER ARTHROSCOPY WITH BICEPSTENOTOMY Right 12/13/2012   Procedure: SHOULDER ARTHROSCOPY WITH BICEPS TENOTOMY, limited shoulder debridement;  Surgeon: Carole Civil, [name];  Location: [facility];  Service: Orthopedics;  Laterality: Right;  . SHOULDER SURGERY Right    torn cartiledge  . SHOULDER SURGERY     right shoulder  . TRANSURETHRAL RESECTION OF PROSTATE N/A 07/09/2014   Procedure: TRANSURETHRAL RESECTION of prostate WITH BUTTON ;  Surgeon: [name], [name];  Location: [facility];  Service: Urology;  Laterality: N/A;    FAMILY HISTORY: The patient's family history includes Thyroid disease in his mother.   SOCIAL HISTORY:  The patient  reports that he quit smoking about 35 years ago. His smoking use included cigarettes. He has a 17.50 pack-year smoking history. He has never used smokeless tobacco. He reports current alcohol use of about 3.0 standard drinks of alcohol per week. He reports that he does not use drugs.  Review of Systems  Constitution: Negative for chills and fever.  HENT: Negative for hoarse voice and nosebleeds.   Eyes: Negative for discharge, double vision and pain.  Cardiovascular: Negative for chest pain, claudication, dyspnea on exertion, leg swelling, near-syncope, orthopnea, palpitations, paroxysmal nocturnal dyspnea and syncope.  Respiratory: Negative for hemoptysis and shortness of breath.   Musculoskeletal: Negative for muscle cramps and myalgias.  Gastrointestinal: Negative for abdominal pain, constipation, diarrhea, hematemesis, hematochezia, melena, nausea and vomiting.  Neurological: Negative for dizziness and light-headedness.    PHYSICAL EXAM: Vitals with BMI 06/19/2019 12/19/2018  12/05/2018  Height 5\' 7"  5\' 7"  5\' 7"   Weight 243 lbs 242 lbs 6 oz 236 lbs  BMI 38.05 0000000 [id number]  Systolic 99991111 A999333 A999333  Diastolic 69 57 62  Pulse 68 61 54    CONSTITUTIONAL: Well-developed and well-nourished. No acute distress.  SKIN: Skin is warm and dry. No rash noted. No cyanosis. No pallor. No jaundice HEAD: Normocephalic and atraumatic.  EYES: No scleral icterus MOUTH/THROAT: Moist oral membranes.  NECK: No JVD present. No thyromegaly noted.  Right carotid bruit. LYMPHATIC: No visible cervical adenopathy.  CHEST Normal respiratory effort. No intercostal retractions  LUNGS: Clear to auscultation bilaterally.  No stridor. No wheezes. No rales.  CARDIOVASCULAR: Regular rate and rhythm, positive [id number], 3+ systolic ejection murmur heard second intercostal space, no rubs or gallops appreciated. ABDOMINAL: Obese, soft, nontender, nondistended, positive bowel sounds all 4 quadrants. No apparent ascites.  EXTREMITIES: No peripheral edema  HEMATOLOGIC: No significant bruising NEUROLOGIC: Oriented to person, place, and time. Nonfocal. Normal muscle tone.  PSYCHIATRIC: Normal mood and affect. Normal behavior. Cooperative  CARDIAC DATABASE: Echocardiogram 12/11/2018: Left ventricle cavity is normal in size. Mild concentric hypertrophy of the left ventricle. Normal global wall motion. Normal LV systolic function with EF 60%. Doppler evidence of grade I (impaired) diastolic dysfunction, normal LAP.  Trileaflet aortic valve with mild calcification of the aortic valve annulus and leaflets. Mild aortic valve stenosis. No regurgitation noted. Aortic valve mean gradient of 8 mmHg, Vmax of 2.0 m/s. Calculated aortic valve area by continuity equation is 1.6 cm. Mild (Grade I) mitral regurgitation.  Mild tricuspid regurgitation. No evidence of pulmonary hypertension. No significant change compares to previous study on 05/25/2016.  Event Monitor 30 days 02/19/2015: NSR, No symptoms reported. No A.  Fib or heart block.  Lexiscan myoview stress test 02/26/2015: 1. The resting electrocardiogram demonstrated normal sinus rhythm, incomplete RBBB and no resting arrhythmias. Stress EKG is non-diagnostic for ischemia as it a pharmacologic stress using Lexiscan.Stress symptoms included dyspnea. 2. The perfusion imaging study demonstrates a very small sized inferolateral and inferoapical subtle ischemia. LVEF was normal without regional wall motion at 77%. This represents a low risk study.  Carotid duplex: 04/28/2019: Right Carotid: Velocities in the right ICA are consistent with a 40-59% stenosis.  Left Carotid: Patent left ICA stent without significant stenosis.  Vertebrals: Bilateral vertebral arteries demonstrate antegrade flow.   LABORATORY DATA: CBC Latest Ref Rng & Units 11/03/2017 11/02/2017 10/12/2015  WBC 4.0 - 10.5 K/uL 8.6 11.6(H) 7.2  Hemoglobin 13.0 - 17.0 g/dL 12.9(L) 13.2 13.4  Hematocrit 39.0 - 52.0 % 38.8(L) 40.0 42.0  Platelets 150 - 400  K/uL 210 222 263    CMP Latest Ref Rng & Units 11/03/2017 11/02/2017 10/12/2015  Glucose 70 - 99 mg/dL 105(H) 105(H) 100(H)  BUN 8 - 23 mg/dL 14 19 13   Creatinine 0.61 - 1.24 mg/dL 1.02 1.28(H) 0.96  Sodium 135 - 145 mmol/L 135 132(L) 139  Potassium 3.5 - 5.1 mmol/L 4.2 3.1(L) 3.6  Chloride 98 - 111 mmol/L 104 99 105  CO2 22 - 32 mmol/L 26 26 26   Calcium 8.9 - 10.3 mg/dL 8.5(L) 8.7(L) 9.2  Total Protein 6.5 - 8.1 g/dL 6.2(L) 6.9 -  Total Bilirubin 0.3 - 1.2 mg/dL 0.8 0.6 -  Alkaline Phos 38 - 126 U/L 35(L) 37(L) -  AST 15 - 41 U/L 25 23 -  ALT 0 - 44 U/L 19 20 -    Lipid Panel     Component Value Date/Time   CHOL 110 05/24/2014 0354   TRIG 79 05/24/2014 0354   HDL 35 (L) 05/24/2014 0354   CHOLHDL 3.1 05/24/2014 0354   VLDL 16 05/24/2014 0354   LDLCALC 59 05/24/2014 0354    Lab Results  Component Value Date   HGBA1C 6.2 (H) 05/24/2014   No components found for: NTPROBNP Lab Results  Component Value Date   TSH 0.73  10/11/2010    Cardiac Panel (last 3 results) No results for input(s): CKTOTAL, CKMB, TROPONINIHS, RELINDX in the last 72 hours.  FINAL MEDICATION LIST END OF ENCOUNTER: No orders of the defined types were placed in this encounter.   Medications Discontinued During This Encounter  Medication Reason  . atenolol (TENORMIN) 25 MG tablet Patient Preference     Current Outpatient Medications:  .  ALPRAZolam (XANAX) 0.25 MG tablet, Take 0.25 mg by mouth at bedtime as needed., Disp: , Rfl:  .  aspirin EC 81 MG tablet, Take 81 mg by mouth daily., Disp: , Rfl:  .  atorvastatin (LIPITOR) 40 MG tablet, Take 1 tablet (40 mg total) by mouth every evening., Disp: 30 tablet, Rfl: 1 .  Cholecalciferol (VITAMIN D PO), Take 1 capsule by mouth every evening., Disp: , Rfl:  .  clopidogrel (PLAVIX) 75 MG tablet, TAKE 1 TABLET DAILY (Patient taking differently: Take 75 mg by mouth every morning. ), Disp: 90 tablet, Rfl: 3 .  finasteride (PROSCAR) 5 MG tablet, Take 1 tablet (5 mg total) by mouth daily. (Patient taking differently: Take 5 mg by mouth every evening. ), Disp: 30 tablet, Rfl: 11 .  furosemide (LASIX) 40 MG tablet, Take 20 mg by mouth in the morning., Disp: , Rfl:  .  hydrALAZINE (APRESOLINE) 25 MG tablet, TAKE 1 TABLET THREE TIMES A DAY (Patient taking differently: Take 25 mg by mouth in the morning and at bedtime. ), Disp: 270 tablet, Rfl: 3 .  olmesartan (BENICAR) 40 MG tablet, TAKE 1 TABLET DAILY, Disp: 90 tablet, Rfl: 3 .  pantoprazole (PROTONIX) 40 MG tablet, Take 40 mg by mouth daily., Disp: , Rfl:  .  polyethylene glycol (MIRALAX / GLYCOLAX) packet, Take 17 g by mouth every morning., Disp: , Rfl:  .  REFRESH CELLUVISC 1 % GEL, as needed. , Disp: , Rfl:  .  spironolactone (ALDACTONE) 25 MG tablet, Take 25 mg by mouth daily., Disp: , Rfl:   IMPRESSION:    ICD-10-CM   1. Atherosclerosis of left carotid artery  I65.22   2. Benign hypertension  I10 EKG 12-Lead  3. Mixed hyperlipidemia  E78.2    4. Hx of  stroke  Z86.73   5.  Class 2 severe obesity due to excess calories with serious comorbidity and body mass index (BMI) of 38.0 to 38.9 in adult (HCC)  E66.01    Z68.38   6. Former smoker  Z87.891      RECOMMENDATIONS: Henry [name] is a 85 y.o. male whose past medical history and cardiovascular risk factors include: Hyperlipidemia, hypertension, hyperglycemia, history of carotid artery disease status post stenting, history of CVA in 2016, history of syncope, advanced age, obesity due to excess calories.  Benign essential hypertension with history of tachycardia:  Office blood pressures are elevated.  However his home blood pressures are very well controlled.  During last office visit he was restarted on atenolol secondary to tachycardia.  However, patient has been having lower blood pressure readings at home.    He brought his [employer] blood pressure cuff to the office to review his blood pressure readings.  His systolic blood pressures range between [id number] mmHg and diastolic blood pressures range around 80 mmHg.    Given the fact that he has had history of syncope and at the age of 79 would recommend holding his atenolol for now.  If he continues to have lower blood pressure readings at home may consider decreasing his other antihypertensive medications as well if clinically appropriate.    Will defer this to his primary provider as patient has been following Dr. Philip Aspen for blood pressure management.   Mixed hyperlipidemia: Continue statin therapy.  Patient states that he will have a repeat lipid profile in November with his yearly physical.   History of CVA: Currently on dual antiplatelet therapy.  Continue statin therapy.  Continue aggressive risk factor modifications.  Atherosclerosis of the left carotid artery status post stenting: Most recent carotid duplex reviewed with the patient and his wife.   Former smoker: Educated on the importance of continued smoking  cessation.  Orders Placed This Encounter  Procedures  . EKG 12-Lead   --Continue cardiac medications as reconciled in final medication list. --Return in about 7 months (around 01/14/2020) for HLD followup. Or sooner if needed. --Continue follow-up with your primary care physician regarding the management of your other chronic comorbid conditions.  Patient's questions and concerns were addressed to his satisfaction. He voices understanding of the instructions provided during this encounter.   During this visit I reviewed and updated: Tobacco history  allergies medication reconciliation  medical history  surgical history  family history  social history.  This note was created using a voice recognition software as a result there may be grammatical errors inadvertently enclosed that do not reflect the nature of this encounter. Every attempt is made to correct such errors.  Henry [name], [place], [facility]  Pager: [phone] Office: [phone]

## 2019-06-23 DIAGNOSIS — I130 Hypertensive heart and chronic kidney disease with heart failure and stage 1 through stage 4 chronic kidney disease, or unspecified chronic kidney disease: Secondary | ICD-10-CM | POA: Diagnosis not present

## 2019-06-24 ENCOUNTER — Telehealth: Payer: Self-pay

## 2019-06-24 NOTE — Telephone Encounter (Signed)
Pt wife called to inform us that pt bp is still low. Pt wife would like to know is there any thing he can take or do for his bp to increase again. Pt bp today was 96/58 pulse 62, after breakfast 118/56 pulse 62 This is your number: [phone] [facility]

## 2019-06-27 NOTE — Telephone Encounter (Signed)
Called the patient but had to leave a voicemail. Let me know when she calls back. Otherwise will try later today.

## 2019-06-30 ENCOUNTER — Telehealth: Payer: Self-pay

## 2019-06-30 NOTE — Telephone Encounter (Signed)
[name] called back again. His BP is still running low Atenolol was stopped and lasix was cut down to 20 mg. Patient still feels very weak.  Dr. [name] Skains tried to call before he left but they did not answer  Please call her this evening or after 4 tomorrow

## 2019-06-30 NOTE — Telephone Encounter (Signed)
[name] called back again. His BP is still running low Atenolol was stopped and lasix was cut down to 20 mg. Patient still feels very weak.  Dr. [name] Skains tried to call before he left but they did not answer

## 2019-06-30 NOTE — Telephone Encounter (Signed)
Stop lasix and monitor BP. Encourage increased oral hydration. If symptoms not improved, needs in office visit with PCP or [place].  Thanks MJP

## 2019-07-01 NOTE — Telephone Encounter (Signed)
Called pt to inform him to stop his lasix and monitor his bp. Pt understood

## 2019-07-16 ENCOUNTER — Other Ambulatory Visit: Payer: Self-pay | Admitting: Cardiology

## 2019-07-29 ENCOUNTER — Encounter: Payer: Self-pay | Admitting: Adult Health

## 2019-07-31 ENCOUNTER — Encounter: Payer: Self-pay | Admitting: Adult Health

## 2019-07-31 ENCOUNTER — Ambulatory Visit (INDEPENDENT_AMBULATORY_CARE_PROVIDER_SITE_OTHER): Payer: Medicare Other | Admitting: Adult Health

## 2019-07-31 VITALS — BP 145/71 | HR 79 | Ht 67.0 in | Wt 242.0 lb

## 2019-07-31 DIAGNOSIS — Z9989 Dependence on other enabling machines and devices: Secondary | ICD-10-CM

## 2019-07-31 DIAGNOSIS — I6523 Occlusion and stenosis of bilateral carotid arteries: Secondary | ICD-10-CM | POA: Diagnosis not present

## 2019-07-31 DIAGNOSIS — G4733 Obstructive sleep apnea (adult) (pediatric): Secondary | ICD-10-CM

## 2019-07-31 NOTE — Progress Notes (Signed)
PATIENT: Henry [name] DOB: [birth year]  REASON FOR VISIT: follow up HISTORY FROM: patient  HISTORY OF PRESENT ILLNESS: Today 07/31/19:  Henry [name] is an 85 year old male with a history of obstructive sleep apnea on CPAP.  His download indicates that he use his machine nightly for compliance of 100%.  He uses machine greater than 4 hours each night.  On average he uses his machine 8 hours and 16 minutes.  His residual AHI is 3.9 on 9 cm of water with EPR 3.  Leak in the 95th percentile is 11.1.  He reports that the CPAP continues to work well for him.  He returns today for an evaluation.  HISTORY 07/31/18  Henry [name] is an 85 year old male with a history of obstructive sleep apnea on CPAP.  His download indicates that he uses machine nightly and greater than 4 hours each night.  On average he uses his machine 8 hours and 19 minutes.  His residual AHI is 6.2 on 8 cm of water with EPR of 3.  He reports that he is tolerating the machine well.  He does not feel the mask leaking at night.  Denies any new issues.  REVIEW OF SYSTEMS: Out of a complete 14 system review of symptoms, the patient complains only of the following symptoms, and all other reviewed systems are negative.  ESS  1  ALLERGIES: No Known Allergies  HOME MEDICATIONS: Outpatient Medications Prior to Visit  Medication Sig Dispense Refill  . ALPRAZolam (XANAX) 0.25 MG tablet Take 0.25 mg by mouth at bedtime as needed.    Marland Kitchen aspirin EC 81 MG tablet Take 81 mg by mouth daily.    Marland Kitchen atorvastatin (LIPITOR) 40 MG tablet Take 1 tablet (40 mg total) by mouth every evening. 30 tablet 1  . Cholecalciferol (VITAMIN D PO) Take 1 capsule by mouth every evening.    . clopidogrel (PLAVIX) 75 MG tablet TAKE 1 TABLET DAILY (Patient taking differently: Take 75 mg by mouth every morning. ) 90 tablet 3  . finasteride (PROSCAR) 5 MG tablet Take 1 tablet (5 mg total) by mouth daily. (Patient taking differently: Take 5 mg by mouth every  evening. ) 30 tablet 11  . furosemide (LASIX) 40 MG tablet Take 20 mg by mouth in the morning.    . hydrALAZINE (APRESOLINE) 25 MG tablet TAKE 1 TABLET THREE TIMES A DAY (Patient taking differently: Take 25 mg by mouth in the morning and at bedtime. ) 270 tablet 3  . olmesartan (BENICAR) 40 MG tablet TAKE 1 TABLET DAILY 90 tablet 1  . pantoprazole (PROTONIX) 40 MG tablet Take 40 mg by mouth daily.    . polyethylene glycol (MIRALAX / GLYCOLAX) packet Take 17 g by mouth every morning.    Marland Kitchen REFRESH CELLUVISC 1 % GEL as needed.     Marland Kitchen spironolactone (ALDACTONE) 25 MG tablet Take 25 mg by mouth daily.     No facility-administered medications prior to visit.    PAST MEDICAL HISTORY: Past Medical History:  Diagnosis Date  . Accelerated hypertension 06/29/2014  . Arthritis    Back  . Chronic kidney disease   . Dizziness and giddiness   . Enlarged prostate   . Esophagitis 05/10/2011  . Foley catheter in place   . FOOT SPRAIN, RIGHT 07/18/2007   Qualifier: Diagnosis of  By: Aline Brochure MD, [name]    . GERD (gastroesophageal reflux disease)   . HOH (hard of hearing)   . Hx-TIA (transient ischemic attack)  hx of this- wife states 47 years ago-but Dr. Estil Daft told wife he had had several-has 2 stents in carotid artery-left  . Hypercholesteremia   . Hypertension   . Low back pain   . Nocturia   . Shingles   . Stroke [facility])    had a stroke 05/23/2014  . Syncope and collapse     PAST SURGICAL HISTORY: Past Surgical History:  Procedure Laterality Date  . carotid stents  05/2014   2 stents in carotid artery on left side  . COLONOSCOPY    . COLONOSCOPY  05/17/2011   Procedure: COLONOSCOPY;  Surgeon: [name], MD;  [facility];  Service: Endoscopy;  Laterality: N/A;  830  . epidural steroid injection  05/22/2014   had this in lumbar and at 1 am on 05/23/2014 had a stroke  . ESOPHAGOGASTRODUODENOSCOPY  10/28/2010   Procedure: ESOPHAGOGASTRODUODENOSCOPY (EGD);  Surgeon: [name], MD;  [facility];  Service: Endoscopy;  Laterality: N/A;  10:30  . ESOPHAGOGASTRODUODENOSCOPY N/A 04/24/2014   Procedure: ESOPHAGOGASTRODUODENOSCOPY (EGD);  Surgeon: [name], MD;  [facility];  Service: Endoscopy;  Laterality: N/A;  815  . ESOPHAGOGASTRODUODENOSCOPY N/A 06/26/2014   Procedure: ESOPHAGOGASTRODUODENOSCOPY (EGD);  Surgeon: [name], MD;  Location: [facility];  Service: Endoscopy;  Laterality: N/A;  . EUS N/A 06/26/2014   Procedure: FULL UPPER ENDOSCOPIC ULTRASOUND (EUS) RADIAL;  Surgeon: [name], MD;  Location: [facility];  Service: Endoscopy;  Laterality: N/A;  . IR GENERIC HISTORICAL  10/12/2015   IR ANGIO VERTEBRAL SEL VERTEBRAL BILAT MOD SED 10/12/2015 Luanne Bras, MD [facility]  . IR GENERIC HISTORICAL  10/12/2015   IR ANGIO INTRA EXTRACRAN SEL COM CAROTID INNOMINATE BILAT MOD SED 10/12/2015 Luanne Bras, MD [facility]  . RADIOLOGY WITH ANESTHESIA N/A 05/23/2014   Procedure: RADIOLOGY WITH ANESTHESIA;  Surgeon: Medication Radiologist, MD;  Location: [place];  Service: Radiology;  Laterality: N/A;  . RESECTION DISTAL CLAVICAL Right 12/13/2012   Procedure: RESECTION DISTAL CLAVICAL;  Surgeon: Carole Civil, MD;  Location: [facility];  Service: Orthopedics;  Laterality: Right;  . SHOULDER ARTHROSCOPY WITH BICEPSTENOTOMY Right 12/13/2012   Procedure: SHOULDER ARTHROSCOPY WITH BICEPS TENOTOMY, limited shoulder debridement;  Surgeon: Carole Civil, MD;  Location: [facility];  Service: Orthopedics;  Laterality: Right;  . SHOULDER SURGERY Right    torn cartiledge  . SHOULDER SURGERY     right shoulder  . TRANSURETHRAL RESECTION OF PROSTATE N/A 07/09/2014   Procedure: TRANSURETHRAL RESECTION of prostate WITH BUTTON ;  Surgeon: [name], MD;  Location: [facility];  Service: Urology;  Laterality: N/A;    FAMILY HISTORY: Family History  Problem Relation Age of Onset  . Thyroid disease Mother   . Colon cancer Neg Hx      SOCIAL HISTORY: Social History   Socioeconomic History  . Marital status: Married    Spouse name: Not on file  . Number of children: 4  . Years of education: 32  . Highest education level: Not on file  Occupational History  . Occupation: retired    Comment: [employer]  Tobacco Use  . Smoking status: Former Smoker    Packs/day: 0.50    Years: 35.00    Pack years: 17.50    Types: Cigarettes    Quit date: 1986    Years since quitting: 35.4  . Smokeless tobacco: Never Used  Vaping Use  . Vaping Use: Never used  Substance and Sexual Activity  . Alcohol  use: Yes    Alcohol/week: 3.0 standard drinks    Types: 3 Glasses of wine per week  . Drug use: No  . Sexual activity: Yes    Birth control/protection: None  Other Topics Concern  . Not on file  Social History Narrative   Married, retired - [employer]   Right handed   Caffeine use - 3-4 cups coffee daily   Social Determinants of Radio broadcast assistant Strain:   . Difficulty of Paying Living Expenses:   Food Insecurity:   . Worried About Charity fundraiser in the Last Year:   . Arboriculturist in the Last Year:   Transportation Needs:   . Film/video editor (Medical):   Marland Kitchen Lack of Transportation (Non-Medical):   Physical Activity:   . Days of Exercise per Week:   . Minutes of Exercise per Session:   Stress:   . Feeling of Stress :   Social Connections:   . Frequency of Communication with Friends and Family:   . Frequency of Social Gatherings with Friends and Family:   . Attends Religious Services:   . Active Member of Clubs or Organizations:   . Attends Archivist Meetings:   Marland Kitchen Marital Status:   Intimate Partner Violence:   . Fear of Current or Ex-Partner:   . Emotionally Abused:   Marland Kitchen Physically Abused:   . Sexually Abused:       PHYSICAL EXAM  Vitals:   07/31/19 1408  BP: (!) 145/71  Pulse: 79  Weight: 242 lb (109.8 kg)  Height: 5\' 7"  (1.702 m)   Body mass index is 37.9  kg/m.  Generalized: Well developed, in no acute distress  Chest: Lungs clear to auscultation bilaterally  Neurological examination  Mentation: Alert oriented to time, place, history taking. Follows all commands speech and language fluent Cranial nerve II-XII: Extraocular movements were full, visual field were full on confrontational test Head turning and shoulder shrug  were normal and symmetric. Motor: The motor testing reveals 5 over 5 strength of all 4 extremities. Good symmetric motor tone is noted throughout.  Sensory: Sensory testing is intact to soft touch on all 4 extremities. No evidence of extinction is noted.  Gait and station: Gait is normal.    DIAGNOSTIC DATA (LABS, IMAGING, TESTING) - I reviewed patient records, labs, notes, testing and imaging myself where available.  Lab Results  Component Value Date   WBC 8.6 11/03/2017   HGB 12.9 (L) 11/03/2017   HCT 38.8 (L) 11/03/2017   MCV 85.8 11/03/2017   PLT 210 11/03/2017      Component Value Date/Time   NA 135 11/03/2017 0414   NA 141 10/11/2010 0000   K 4.2 11/03/2017 0414   CL 104 11/03/2017 0414   CO2 26 11/03/2017 0414   GLUCOSE 105 (H) 11/03/2017 0414   BUN 14 11/03/2017 0414   BUN 21 10/11/2010 0000   CREATININE 1.02 11/03/2017 0414   CALCIUM 8.5 (L) 11/03/2017 0414   PROT 6.2 (L) 11/03/2017 0414   ALBUMIN 3.3 (L) 11/03/2017 0414   AST 25 11/03/2017 0414   ALT 19 11/03/2017 0414   ALKPHOS 35 (L) 11/03/2017 0414   BILITOT 0.8 11/03/2017 0414   GFRNONAA >60 11/03/2017 0414   GFRAA >60 11/03/2017 0414   Lab Results  Component Value Date   CHOL 110 05/24/2014   HDL 35 (L) 05/24/2014   LDLCALC 59 05/24/2014   TRIG 79 05/24/2014   CHOLHDL 3.1 05/24/2014  Lab Results  Component Value Date   HGBA1C 6.2 (H) 05/24/2014    Lab Results  Component Value Date   TSH 0.73 10/11/2010      ASSESSMENT AND PLAN 85 y.o. year old male  has a past medical history of Accelerated hypertension (06/29/2014),  Arthritis, Chronic kidney disease, Dizziness and giddiness, Enlarged prostate, Esophagitis (05/10/2011), Foley catheter in place, FOOT SPRAIN, RIGHT (07/18/2007), GERD (gastroesophageal reflux disease), HOH (hard of hearing), TIA (transient ischemic attack), Hypercholesteremia, Hypertension, Low back pain, Nocturia, Shingles, Stroke ([place]), and Syncope and collapse. here with:  1. OSA on CPAP  - CPAP compliance excellent - Good treatment of AHI  - Encourage patient to use CPAP nightly and > 4 hours each night - F/U in 1 year or sooner if needed   I spent 20 minutes of face-to-face and non-face-to-face time with patient.  This included previsit chart review, lab review, study review, order entry, electronic health record documentation, patient education.  Ward Givens, MSN, NP-C 07/31/2019, 2:47 PM [facility] [address], [place], [place] [address] [phone]

## 2019-09-17 DIAGNOSIS — M545 Low back pain: Secondary | ICD-10-CM | POA: Diagnosis not present

## 2019-09-17 DIAGNOSIS — M9905 Segmental and somatic dysfunction of pelvic region: Secondary | ICD-10-CM | POA: Diagnosis not present

## 2019-09-17 DIAGNOSIS — M9902 Segmental and somatic dysfunction of thoracic region: Secondary | ICD-10-CM | POA: Diagnosis not present

## 2019-09-17 DIAGNOSIS — M9903 Segmental and somatic dysfunction of lumbar region: Secondary | ICD-10-CM | POA: Diagnosis not present

## 2019-09-17 DIAGNOSIS — M546 Pain in thoracic spine: Secondary | ICD-10-CM | POA: Diagnosis not present

## 2019-11-07 DIAGNOSIS — M545 Low back pain: Secondary | ICD-10-CM | POA: Diagnosis not present

## 2019-11-07 DIAGNOSIS — M9902 Segmental and somatic dysfunction of thoracic region: Secondary | ICD-10-CM | POA: Diagnosis not present

## 2019-11-07 DIAGNOSIS — M9903 Segmental and somatic dysfunction of lumbar region: Secondary | ICD-10-CM | POA: Diagnosis not present

## 2019-11-07 DIAGNOSIS — M9905 Segmental and somatic dysfunction of pelvic region: Secondary | ICD-10-CM | POA: Diagnosis not present

## 2019-11-07 DIAGNOSIS — M546 Pain in thoracic spine: Secondary | ICD-10-CM | POA: Diagnosis not present

## 2019-11-08 DIAGNOSIS — Z23 Encounter for immunization: Secondary | ICD-10-CM | POA: Diagnosis not present

## 2019-11-10 ENCOUNTER — Other Ambulatory Visit (HOSPITAL_COMMUNITY): Payer: Self-pay | Admitting: Interventional Radiology

## 2019-11-10 DIAGNOSIS — I771 Stricture of artery: Secondary | ICD-10-CM

## 2019-11-10 DIAGNOSIS — I639 Cerebral infarction, unspecified: Secondary | ICD-10-CM

## 2019-11-18 ENCOUNTER — Ambulatory Visit (HOSPITAL_COMMUNITY)
Admission: RE | Admit: 2019-11-18 | Discharge: 2019-11-18 | Disposition: A | Discharge status: Home / Self Care | Payer: Medicare Other | Source: Ambulatory Visit | Attending: Interventional Radiology | Admitting: Interventional Radiology

## 2019-11-18 ENCOUNTER — Other Ambulatory Visit: Payer: Self-pay

## 2019-11-18 DIAGNOSIS — I639 Cerebral infarction, unspecified: Secondary | ICD-10-CM

## 2019-11-18 DIAGNOSIS — I771 Stricture of artery: Secondary | ICD-10-CM | POA: Diagnosis not present

## 2019-11-18 NOTE — Progress Notes (Signed)
Carotid artery duplex completed. Refer to "CV Proc" under chart review to view preliminary results.  11/18/2019 9:44 AM Kelby Aline., MHA, RVT, RDCS, RDMS

## 2019-11-19 ENCOUNTER — Encounter (HOSPITAL_COMMUNITY): Payer: Self-pay

## 2019-11-19 ENCOUNTER — Telehealth (HOSPITAL_COMMUNITY): Payer: Self-pay | Admitting: Radiology

## 2019-11-19 NOTE — Telephone Encounter (Signed)
Called pt, left VM that per Dr. Estanislado Pandy he will need another US carotid study in 6 months time unless he is have current symptoms. Told patient to call our office if he is having symptoms, otherwise we will call him to schedule this in 6 months. Also sent pt a message with this info through My Chart. JM

## 2019-12-19 DIAGNOSIS — M545 Low back pain, unspecified: Secondary | ICD-10-CM | POA: Diagnosis not present

## 2020-01-07 DIAGNOSIS — E785 Hyperlipidemia, unspecified: Secondary | ICD-10-CM | POA: Diagnosis not present

## 2020-01-07 DIAGNOSIS — Z125 Encounter for screening for malignant neoplasm of prostate: Secondary | ICD-10-CM | POA: Diagnosis not present

## 2020-01-15 DIAGNOSIS — G4733 Obstructive sleep apnea (adult) (pediatric): Secondary | ICD-10-CM | POA: Diagnosis not present

## 2020-01-15 DIAGNOSIS — I130 Hypertensive heart and chronic kidney disease with heart failure and stage 1 through stage 4 chronic kidney disease, or unspecified chronic kidney disease: Secondary | ICD-10-CM | POA: Diagnosis not present

## 2020-01-15 DIAGNOSIS — Z0000 Encounter for general adult medical examination without abnormal findings: Secondary | ICD-10-CM | POA: Diagnosis not present

## 2020-01-15 DIAGNOSIS — E785 Hyperlipidemia, unspecified: Secondary | ICD-10-CM | POA: Diagnosis not present

## 2020-01-15 DIAGNOSIS — H9193 Unspecified hearing loss, bilateral: Secondary | ICD-10-CM | POA: Diagnosis not present

## 2020-01-15 DIAGNOSIS — I5032 Chronic diastolic (congestive) heart failure: Secondary | ICD-10-CM | POA: Diagnosis not present

## 2020-01-15 DIAGNOSIS — N1831 Chronic kidney disease, stage 3a: Secondary | ICD-10-CM | POA: Diagnosis not present

## 2020-01-15 DIAGNOSIS — R42 Dizziness and giddiness: Secondary | ICD-10-CM | POA: Diagnosis not present

## 2020-01-15 DIAGNOSIS — I10 Essential (primary) hypertension: Secondary | ICD-10-CM | POA: Diagnosis not present

## 2020-01-16 ENCOUNTER — Other Ambulatory Visit: Payer: Self-pay | Admitting: Cardiology

## 2020-01-16 DIAGNOSIS — M546 Pain in thoracic spine: Secondary | ICD-10-CM | POA: Diagnosis not present

## 2020-01-16 DIAGNOSIS — M9902 Segmental and somatic dysfunction of thoracic region: Secondary | ICD-10-CM | POA: Diagnosis not present

## 2020-01-16 DIAGNOSIS — M9903 Segmental and somatic dysfunction of lumbar region: Secondary | ICD-10-CM | POA: Diagnosis not present

## 2020-01-16 DIAGNOSIS — M5136 Other intervertebral disc degeneration, lumbar region: Secondary | ICD-10-CM | POA: Diagnosis not present

## 2020-01-16 DIAGNOSIS — M9905 Segmental and somatic dysfunction of pelvic region: Secondary | ICD-10-CM | POA: Diagnosis not present

## 2020-01-19 ENCOUNTER — Ambulatory Visit: Payer: Medicare Other | Admitting: Cardiology

## 2020-01-19 ENCOUNTER — Other Ambulatory Visit: Payer: Self-pay

## 2020-01-19 ENCOUNTER — Encounter: Payer: Self-pay | Admitting: Cardiology

## 2020-01-19 VITALS — BP 148/74 | HR 72 | Ht 67.0 in | Wt 242.0 lb

## 2020-01-19 DIAGNOSIS — I10 Essential (primary) hypertension: Secondary | ICD-10-CM | POA: Diagnosis not present

## 2020-01-19 DIAGNOSIS — E782 Mixed hyperlipidemia: Secondary | ICD-10-CM | POA: Diagnosis not present

## 2020-01-19 DIAGNOSIS — I6522 Occlusion and stenosis of left carotid artery: Secondary | ICD-10-CM

## 2020-01-19 DIAGNOSIS — Z712 Person consulting for explanation of examination or test findings: Secondary | ICD-10-CM | POA: Diagnosis not present

## 2020-01-19 DIAGNOSIS — Z87891 Personal history of nicotine dependence: Secondary | ICD-10-CM | POA: Diagnosis not present

## 2020-01-19 DIAGNOSIS — Z6837 Body mass index (BMI) 37.0-37.9, adult: Secondary | ICD-10-CM | POA: Diagnosis not present

## 2020-01-19 DIAGNOSIS — Z8673 Personal history of transient ischemic attack (TIA), and cerebral infarction without residual deficits: Secondary | ICD-10-CM

## 2020-01-19 NOTE — Patient Instructions (Signed)
DASH Eating Plan DASH stands for "Dietary Approaches to Stop Hypertension." The DASH eating plan is a healthy eating plan that has been shown to reduce high blood pressure (hypertension). It may also reduce your risk for type 2 diabetes, heart disease, and stroke. The DASH eating plan may also help with weight loss. What are tips for following this plan?  General guidelines  Avoid eating more than 2,300 mg (milligrams) of salt (sodium) a day. If you have hypertension, you may need to reduce your sodium intake to 1,500 mg a day.  Limit alcohol intake to no more than 1 drink a day for nonpregnant women and 2 drinks a day for men. One drink equals 12 oz of beer, 5 oz of wine, or 1 oz of hard liquor.  Work with your health care provider to maintain a healthy body weight or to lose weight. Ask what an ideal weight is for you.  Get at least 30 minutes of exercise that causes your heart to beat faster (aerobic exercise) most days of the week. Activities may include walking, swimming, or biking.  Work with your health care provider or diet and nutrition specialist (dietitian) to adjust your eating plan to your individual calorie needs. Reading food labels   Check food labels for the amount of sodium per serving. Choose foods with less than 5 percent of the Daily Value of sodium. Generally, foods with less than 300 mg of sodium per serving fit into this eating plan.  To find whole grains, look for the word "whole" as the first word in the ingredient list. Shopping  Buy products labeled as "low-sodium" or "no salt added."  Buy fresh foods. Avoid canned foods and premade or frozen meals. Cooking  Avoid adding salt when cooking. Use salt-free seasonings or herbs instead of table salt or sea salt. Check with your health care provider or pharmacist before using salt substitutes.  Do not fry foods. Cook foods using healthy methods such as baking, boiling, grilling, and broiling instead.  Cook with  heart-healthy oils, such as olive, canola, soybean, or sunflower oil. Meal planning  Eat a balanced diet that includes: ? 5 or more servings of fruits and vegetables each day. At each meal, try to fill half of your plate with fruits and vegetables. ? Up to 6-8 servings of whole grains each day. ? Less than 6 oz of lean meat, poultry, or fish each day. A 3-oz serving of meat is about the same size as a deck of cards. One egg equals 1 oz. ? 2 servings of low-fat dairy each day. ? A serving of nuts, seeds, or beans 5 times each week. ? Heart-healthy fats. Healthy fats called Omega-3 fatty acids are found in foods such as flaxseeds and coldwater fish, like sardines, salmon, and mackerel.  Limit how much you eat of the following: ? Canned or prepackaged foods. ? Food that is high in trans fat, such as fried foods. ? Food that is high in saturated fat, such as fatty meat. ? Sweets, desserts, sugary drinks, and other foods with added sugar. ? Full-fat dairy products.  Do not salt foods before eating.  Try to eat at least 2 vegetarian meals each week.  Eat more home-cooked food and less restaurant, buffet, and fast food.  When eating at a restaurant, ask that your food be prepared with less salt or no salt, if possible. What foods are recommended? The items listed may not be a complete list. Talk with your dietitian about   what dietary choices are best for you. Grains Whole-grain or whole-wheat bread. Whole-grain or whole-wheat pasta. Brown rice. Oatmeal. Quinoa. Bulgur. Whole-grain and low-sodium cereals. Pita bread. Low-fat, low-sodium crackers. Whole-wheat flour tortillas. Vegetables Fresh or frozen vegetables (raw, steamed, roasted, or grilled). Low-sodium or reduced-sodium tomato and vegetable juice. Low-sodium or reduced-sodium tomato sauce and tomato paste. Low-sodium or reduced-sodium canned vegetables. Fruits All fresh, dried, or frozen fruit. Canned fruit in natural juice (without  added sugar). Meat and other protein foods Skinless chicken or turkey. Ground chicken or turkey. Pork with fat trimmed off. Fish and seafood. Egg whites. Dried beans, peas, or lentils. Unsalted nuts, nut butters, and seeds. Unsalted canned beans. Lean cuts of beef with fat trimmed off. Low-sodium, lean deli meat. Dairy Low-fat (1%) or fat-free (skim) milk. Fat-free, low-fat, or reduced-fat cheeses. Nonfat, low-sodium ricotta or cottage cheese. Low-fat or nonfat yogurt. Low-fat, low-sodium cheese. Fats and oils Soft margarine without trans fats. Vegetable oil. Low-fat, reduced-fat, or light mayonnaise and salad dressings (reduced-sodium). Canola, safflower, olive, soybean, and sunflower oils. Avocado. Seasoning and other foods Herbs. Spices. Seasoning mixes without salt. Unsalted popcorn and pretzels. Fat-free sweets. What foods are not recommended? The items listed may not be a complete list. Talk with your dietitian about what dietary choices are best for you. Grains Baked goods made with fat, such as croissants, muffins, or some breads. Dry pasta or rice meal packs. Vegetables Creamed or fried vegetables. Vegetables in a cheese sauce. Regular canned vegetables (not low-sodium or reduced-sodium). Regular canned tomato sauce and paste (not low-sodium or reduced-sodium). Regular tomato and vegetable juice (not low-sodium or reduced-sodium). Pickles. Olives. Fruits Canned fruit in a light or heavy syrup. Fried fruit. Fruit in cream or butter sauce. Meat and other protein foods Fatty cuts of meat. Ribs. Fried meat. Bacon. Sausage. Bologna and other processed lunch meats. Salami. Fatback. Hotdogs. Bratwurst. Salted nuts and seeds. Canned beans with added salt. Canned or smoked fish. Whole eggs or egg yolks. Chicken or turkey with skin. Dairy Whole or 2% milk, cream, and half-and-half. Whole or full-fat cream cheese. Whole-fat or sweetened yogurt. Full-fat cheese. Nondairy creamers. Whipped toppings.  Processed cheese and cheese spreads. Fats and oils Butter. Stick margarine. Lard. Shortening. Ghee. Bacon fat. Tropical oils, such as coconut, palm kernel, or palm oil. Seasoning and other foods Salted popcorn and pretzels. Onion salt, garlic salt, seasoned salt, table salt, and sea salt. Worcestershire sauce. Tartar sauce. Barbecue sauce. Teriyaki sauce. Soy sauce, including reduced-sodium. Steak sauce. Canned and packaged gravies. Fish sauce. Oyster sauce. Cocktail sauce. Horseradish that you find on the shelf. Ketchup. Mustard. Meat flavorings and tenderizers. Bouillon cubes. Hot sauce and Tabasco sauce. Premade or packaged marinades. Premade or packaged taco seasonings. Relishes. Regular salad dressings. Where to find more information:  National Heart, Lung, and Blood Institute: www.nhlbi.nih.gov  American Heart Association: www.heart.org Summary  The DASH eating plan is a healthy eating plan that has been shown to reduce high blood pressure (hypertension). It may also reduce your risk for type 2 diabetes, heart disease, and stroke.  With the DASH eating plan, you should limit salt (sodium) intake to 2,300 mg a day. If you have hypertension, you may need to reduce your sodium intake to 1,500 mg a day.  When on the DASH eating plan, aim to eat more fresh fruits and vegetables, whole grains, lean proteins, low-fat dairy, and heart-healthy fats.  Work with your health care provider or diet and nutrition specialist (dietitian) to adjust your eating plan to your   individual calorie needs. This information is not intended to replace advice given to you by your health care provider. Make sure you discuss any questions you have with your health care provider. Document Revised: 01/12/2017 Document Reviewed: 01/24/2016 Elsevier Patient Education  2020 [employer]  

## 2020-01-19 NOTE — Progress Notes (Signed)
° °Henry [name] °Date of Birth: 07/05/1934 °MRN: [id number] °Primary Care Provider:Paterson, Daniel, MD °Former Cardiology Providers: Ashton Kelley, APRN, FNP-C °Primary Cardiologist:  , DO, FACC (established care 06/19/2019) ° °Date: 01/19/20 °Last Office Visit: 06/19/2019 ° °Chief Complaint  °Patient presents with  °• Atherosclerosis of left carotid artery  °• Follow-up  ° ° °HPI  °Henry [name] is a 85 y.o.  male who presents to the office with a chief complaint of " 6-month follow-up for blood pressure and carotid disease." Patient's past medical history and cardiovascular risk factors include: Hyperlipidemia, hypertension, hyperglycemia, history of carotid artery disease status post stenting, history of CVA in 2016, history of syncope, advanced age, obesity due to excess calories. ° °Patient is accompanied by his wife at today's office visit.   °Since last office visit patient states that he is not having episodes of tachycardia.  And his blood pressures are noted to be stable.  She denies any hospitalizations or urgent care visits for cardiovascular symptoms.  Denies any anginal discomfort or heart failure symptoms. ° °Patient has a history of left carotid artery stent and underwent a repeat carotid duplex in October 2021.  Results reviewed and noted below for further reference.  No significant change given the underlying hemodynamics of his coronary arteries. He remains asymptomatic in regards to no symptoms suggestive of TIA or CVA. ° °Continues to have the same functional status that he had at the last office visit. ° °Outside labs independently reviewed and noted below for further reference. ° °FUNCTIONAL STATUS: He walks 1.5 - 2 miles three times a week.   ° °ALLERGIES: °No Known Allergies ° ° °MEDICATION LIST PRIOR TO VISIT: °Current Outpatient Medications on File Prior to Visit  °Medication Sig Dispense Refill  °• ALPRAZolam (XANAX) 0.25 MG tablet Take 0.25 mg by mouth at bedtime as  needed.    °• aspirin EC 81 MG tablet Take 81 mg by mouth daily.    °• atorvastatin (LIPITOR) 40 MG tablet Take 1 tablet (40 mg total) by mouth every evening. 30 tablet 1  °• Cholecalciferol (VITAMIN D PO) Take 1 capsule by mouth every evening.    °• clopidogrel (PLAVIX) 75 MG tablet TAKE 1 TABLET DAILY (Patient taking differently: Take 75 mg by mouth every morning. ) 90 tablet 3  °• finasteride (PROSCAR) 5 MG tablet Take 1 tablet (5 mg total) by mouth daily. (Patient taking differently: Take 5 mg by mouth every evening. ) 30 tablet 11  °• hydrALAZINE (APRESOLINE) 25 MG tablet TAKE 1 TABLET THREE TIMES A DAY (Patient taking differently: Take 25 mg by mouth in the morning and at bedtime. ) 270 tablet 3  °• olmesartan (BENICAR) 40 MG tablet TAKE 1 TABLET DAILY 90 tablet 3  °• pantoprazole (PROTONIX) 40 MG tablet Take 40 mg by mouth daily.    °• polyethylene glycol (MIRALAX / GLYCOLAX) packet Take 17 g by mouth every morning.    °• REFRESH CELLUVISC 1 % GEL as needed.     °• spironolactone (ALDACTONE) 25 MG tablet Take 25 mg by mouth daily.    ° °No current facility-administered medications on file prior to visit.  ° ° °PAST MEDICAL HISTORY: °Past Medical History:  °Diagnosis Date  °• Accelerated hypertension 06/29/2014  °• Arthritis   ° Back  °• Chronic kidney disease   °• Dizziness and giddiness   °• Enlarged prostate   °• Esophagitis 05/10/2011  °• Foley catheter in place   °• FOOT SPRAIN, RIGHT 07/18/2007  °   Qualifier: Diagnosis of  By: Harrison MD, Stanley    °• GERD (gastroesophageal reflux disease)   °• HOH (hard of hearing)   °• Hx-TIA (transient ischemic attack)   ° hx of this- wife states 10 years ago-but Dr. Deviswar told wife he had had several-has 2 stents in carotid artery-left  °• Hypercholesteremia   °• Hypertension   °• Low back pain   °• Nocturia   °• Shingles   °• Stroke (HCC)   ° had a stroke 05/23/2014  °• Syncope and collapse   ° ° °PAST SURGICAL HISTORY: °Past Surgical History:  °Procedure Laterality  Date  °• carotid stents  05/2014  ° 2 stents in carotid artery on left side  °• COLONOSCOPY    °• COLONOSCOPY  05/17/2011  ° Procedure: COLONOSCOPY;  Surgeon: Najeeb U Rehman, MD;  [facility];  Service: Endoscopy;  Laterality: N/A;  830  °• epidural steroid injection  05/22/2014  ° had this in lumbar and at 1 am on 05/23/2014 had a stroke  °• ESOPHAGOGASTRODUODENOSCOPY  10/28/2010  ° Procedure: ESOPHAGOGASTRODUODENOSCOPY (EGD);  Surgeon: Najeeb U Rehman, MD;  [facility];  Service: Endoscopy;  Laterality: N/A;  10:30  °• ESOPHAGOGASTRODUODENOSCOPY N/A 04/24/2014  ° Procedure: ESOPHAGOGASTRODUODENOSCOPY (EGD);  Surgeon: Najeeb U Rehman, MD;  [facility];  Service: Endoscopy;  Laterality: N/A;  815  °• ESOPHAGOGASTRODUODENOSCOPY N/A 06/26/2014  ° Procedure: ESOPHAGOGASTRODUODENOSCOPY (EGD);  Surgeon: Patrick Hung, MD;  [facility];  Service: Endoscopy;  Laterality: N/A;  °• EUS N/A 06/26/2014  ° Procedure: FULL UPPER ENDOSCOPIC ULTRASOUND (EUS) RADIAL;  Surgeon: Patrick Hung, MD;  Location: [facility];  Service: Endoscopy;  Laterality: N/A;  °• IR GENERIC HISTORICAL  10/12/2015  ° IR ANGIO VERTEBRAL SEL VERTEBRAL BILAT MOD SED 10/12/2015 Sanjeev Deveshwar, MD [facility]  °• IR GENERIC HISTORICAL  10/12/2015  ° IR ANGIO INTRA EXTRACRAN SEL COM CAROTID INNOMINATE BILAT MOD SED 10/12/2015 Sanjeev Deveshwar, MD [facility]  °• RADIOLOGY WITH ANESTHESIA N/A 05/23/2014  ° Procedure: RADIOLOGY WITH ANESTHESIA;  Surgeon: Medication Radiologist, MD;  [facility];  Service: Radiology;  Laterality: N/A;  °• RESECTION DISTAL CLAVICAL Right 12/13/2012  ° Procedure: RESECTION DISTAL CLAVICAL;  Surgeon: Stanley E Harrison, MD;  Location: [facility];  Service: Orthopedics;  Laterality: Right;  °• SHOULDER ARTHROSCOPY WITH BICEPSTENOTOMY Right 12/13/2012  ° Procedure: SHOULDER ARTHROSCOPY WITH BICEPS TENOTOMY, limited shoulder debridement;  Surgeon: Stanley E Harrison, MD;  Location: [facility];   Service: Orthopedics;  Laterality: Right;  °• SHOULDER SURGERY Right   ° torn cartiledge  °• SHOULDER SURGERY    ° right shoulder  °• TRANSURETHRAL RESECTION OF PROSTATE N/A 07/09/2014  ° Procedure: TRANSURETHRAL RESECTION of prostate WITH BUTTON ;  Surgeon: John Wrenn, MD;  [facility];  Service: Urology;  Laterality: N/A;  ° ° °FAMILY HISTORY: °The patient's family history includes Thyroid disease in his mother. °  °SOCIAL HISTORY:  °The patient  reports that he quit smoking about 35 years ago. His smoking use included cigarettes. He has a 17.50 pack-year smoking history. He has never used smokeless tobacco. He reports current alcohol use of about 3.0 standard drinks of alcohol per week. He reports that he does not use drugs. ° °Review of Systems  °Constitutional: Negative for chills and fever.  °HENT: Negative for hoarse voice and nosebleeds.   °Eyes: Negative for discharge, double vision and pain.  °Cardiovascular: Negative for chest pain, claudication, dyspnea on exertion, leg swelling, near-syncope, orthopnea,   palpitations, paroxysmal nocturnal dyspnea and syncope.  Respiratory: Negative for hemoptysis and shortness of breath.   Musculoskeletal: Negative for muscle cramps and myalgias.  Gastrointestinal: Negative for abdominal pain, constipation, diarrhea, hematemesis, hematochezia, melena, nausea and vomiting.  Neurological: Negative for dizziness and light-headedness.    PHYSICAL EXAM: Vitals with BMI 01/19/2020 07/31/2019 06/19/2019  Height 5' 7" 5' 7" 5' 7"  Weight 242 lbs 242 lbs 243 lbs  BMI 37.89 46.80 32.12  Systolic 248 250 037  Diastolic 74 71 69  Pulse 72 79 68    CONSTITUTIONAL: Well-developed and well-nourished. No acute distress.  SKIN: Skin is warm and dry. No rash noted. No cyanosis. No pallor. No jaundice HEAD: Normocephalic and atraumatic.  EYES: No scleral icterus MOUTH/THROAT: Moist oral membranes.  NECK: No JVD present. No thyromegaly noted.  Right carotid  bruit. LYMPHATIC: No visible cervical adenopathy.  CHEST Normal respiratory effort. No intercostal retractions  LUNGS: Clear to auscultation bilaterally.  No stridor. No wheezes. No rales.  CARDIOVASCULAR: Regular rate and rhythm, positive C4-U8, 3+ systolic ejection murmur heard second intercostal space, no rubs or gallops appreciated. ABDOMINAL: Obese, soft, nontender, nondistended, positive bowel sounds all 4 quadrants. No apparent ascites.  EXTREMITIES: No peripheral edema  HEMATOLOGIC: No significant bruising NEUROLOGIC: Oriented to person, place, and time. Nonfocal. Normal muscle tone.  PSYCHIATRIC: Normal mood and affect. Normal behavior. Cooperative  CARDIAC DATABASE: EKG: 01/19/2020: Normal sinus rhythm, low voltage in the precordial leads, without underlying ischemia or injury pattern.   Echocardiogram 12/11/2018: Left ventricle cavity is normal in size. Mild concentric hypertrophy of the left ventricle. Normal global wall motion. Normal LV systolic function with EF 60%. Doppler evidence of grade I (impaired) diastolic dysfunction, normal LAP.  Trileaflet aortic valve with mild calcification of the aortic valve annulus and leaflets. Mild aortic valve stenosis. No regurgitation noted. Aortic valve mean gradient of 8 mmHg, Vmax of 2.0 m/s. Calculated aortic valve area by continuity equation is 1.6 cm. Mild (Grade I) mitral regurgitation.  Mild tricuspid regurgitation. No evidence of pulmonary hypertension. No significant change compares to previous study on 05/25/2016.  Event Monitor 30 days 02/19/2015: NSR, No symptoms reported. No A. Fib or heart block.  Lexiscan myoview stress test 02/26/2015: 1. The resting electrocardiogram demonstrated normal sinus rhythm, incomplete RBBB and no resting arrhythmias. Stress EKG is non-diagnostic for ischemia as it a pharmacologic stress using Lexiscan.Stress symptoms included dyspnea. 2. The perfusion imaging study demonstrates a very  small sized inferolateral and inferoapical subtle ischemia. LVEF was normal without regional wall motion at 77%. This represents a low risk study.  Carotid duplex: 11/18/2019: Right Carotid: Velocities in the right ICA are consistent with a 40-59% stenosis. Hemodynamically significant plaque >50% visualized in the CCA.  Left Carotid: The left ICA stent appears <50% stenosed.   LABORATORY DATA: External Labs: Collected: 01/07/2020 performed at [facility] Creatinine 1.0 mg/dL. eGFR: 70 mL/min per 1.73 m Sodium 130, potassium 4.9, chloride 94, bicarb 23 Hemoglobin 14.3 g/dL and hematocrit 44% Lipid profile: Total cholesterol 132, triglycerides 52, HDL 63, LDL 59, non-HDL 69  FINAL MEDICATION LIST END OF ENCOUNTER: No orders of the defined types were placed in this encounter.   Medications Discontinued During This Encounter  Medication Reason   furosemide (LASIX) 40 MG tablet Completed Course     Current Outpatient Medications:    ALPRAZolam (XANAX) 0.25 MG tablet, Take 0.25 mg by mouth at bedtime as needed., Disp: , Rfl:    aspirin EC 81 MG tablet, Take  81 mg by mouth daily., Disp: , Rfl:  °•  atorvastatin (LIPITOR) 40 MG tablet, Take 1 tablet (40 mg total) by mouth every evening., Disp: 30 tablet, Rfl: 1 °•  Cholecalciferol (VITAMIN D PO), Take 1 capsule by mouth every evening., Disp: , Rfl:  °•  clopidogrel (PLAVIX) 75 MG tablet, TAKE 1 TABLET DAILY (Patient taking differently: Take 75 mg by mouth every morning. ), Disp: 90 tablet, Rfl: 3 °•  finasteride (PROSCAR) 5 MG tablet, Take 1 tablet (5 mg total) by mouth daily. (Patient taking differently: Take 5 mg by mouth every evening. ), Disp: 30 tablet, Rfl: 11 °•  hydrALAZINE (APRESOLINE) 25 MG tablet, TAKE 1 TABLET THREE TIMES A DAY (Patient taking differently: Take 25 mg by mouth in the morning and at bedtime. ), Disp: 270 tablet, Rfl: 3 °•  olmesartan (BENICAR) 40 MG tablet, TAKE 1 TABLET DAILY, Disp: 90 tablet,  Rfl: 3 °•  pantoprazole (PROTONIX) 40 MG tablet, Take 40 mg by mouth daily., Disp: , Rfl:  °•  polyethylene glycol (MIRALAX / GLYCOLAX) packet, Take 17 g by mouth every morning., Disp: , Rfl:  °•  REFRESH CELLUVISC 1 % GEL, as needed. , Disp: , Rfl:  °•  spironolactone (ALDACTONE) 25 MG tablet, Take 25 mg by mouth daily., Disp: , Rfl:  ° °IMPRESSION: ° °  ICD-10-CM   °1. Atherosclerosis of left carotid artery  I65.22 EKG 12-Lead  °  US Carotid Bilateral  °  Carotid  °2. Benign hypertension  I10   °3. Mixed hyperlipidemia  E78.2   °4. Hx of  stroke  Z86.73   °5. Former smoker  Z87.891   °6. Class 2 severe obesity due to excess calories with serious comorbidity and body mass index (BMI) of 37.0 to 37.9 in adult (HCC)  E66.01   ° Z68.37   °7. Encounter to discuss test results  Z71.2   °  ° °RECOMMENDATIONS: °Henry [name] is a 85 y.o. male whose past medical history and cardiovascular risk factors include: Hyperlipidemia, hypertension, hyperglycemia, history of carotid artery disease status post stenting, history of CVA in 2016, history of syncope, advanced age, obesity due to excess calories. ° °Atherosclerosis of the left carotid artery status post stenting:  °· Reviewed the most recent carotid duplex results with the patient and his wife at today's office visit.   °· Continue antiplatelet and statin therapy.   °· Repeat carotid duplex in 6 months to follow disease process.   ° °Benign essential hypertension: °· Office blood pressures are elevated.  However his home blood pressures are very well controlled. °· Continue current antihypertensive medications. ° °Mixed hyperlipidemia: Continue statin therapy.  Independently reviewed outside labs including lipid profile. ° °History of CVA: Currently on dual antiplatelet therapy.  Continue statin therapy.  Continue aggressive risk factor modifications. ° °Former smoker: Educated on the importance of continued smoking cessation. ° °Orders Placed This Encounter   °Procedures  °• US Carotid Bilateral  °• EKG 12-Lead  °• Carotid  ° °--Continue cardiac medications as reconciled in final medication list. °--Return in about 21 weeks (around 06/14/2020) for Follow up carotid disease. . Or sooner if needed. °--Continue follow-up with your primary care physician regarding the management of your other chronic comorbid conditions. ° °Patient's questions and concerns were addressed to his satisfaction. He voices understanding of the instructions provided during this encounter.  ° °This note was created using a voice recognition software as a result there may be grammatical errors inadvertently enclosed that   do not reflect the nature of this encounter. Every attempt is made to correct such errors. ° °Total time spent: 34 minutes ° ° , DO, FACC ° °Pager: [phone] °Office: [phone] ° °

## 2020-02-17 ENCOUNTER — Ambulatory Visit (INDEPENDENT_AMBULATORY_CARE_PROVIDER_SITE_OTHER): Payer: Medicare Other | Admitting: Urology

## 2020-02-17 ENCOUNTER — Other Ambulatory Visit: Payer: Self-pay

## 2020-02-17 ENCOUNTER — Encounter: Payer: Self-pay | Admitting: Urology

## 2020-02-17 VITALS — BP 130/73 | HR 85 | Temp 97.6°F | Ht 67.5 in | Wt 230.0 lb

## 2020-02-17 DIAGNOSIS — R39198 Other difficulties with micturition: Secondary | ICD-10-CM

## 2020-02-17 DIAGNOSIS — N401 Enlarged prostate with lower urinary tract symptoms: Secondary | ICD-10-CM | POA: Diagnosis not present

## 2020-02-17 MED ORDER — FINASTERIDE 5 MG PO TABS: 5.0000 mg | ORAL_TABLET | Freq: Every day | ORAL | 3 refills | Status: DC

## 2020-02-17 NOTE — Progress Notes (Signed)
H&P    History of Present Illness: This man is here today for follow-up of gross hematuria as well as BPH.  He underwent transurethral reparation of the prostate in 2016.  At that time, prostate volume was approximately 150 mL.  He did have recurrent hematuria over the past few years, he is now on finasteride.  He is also on Plavix and aspirin.  His wife does complain that he sprays when he urinates, as there is a mess around the toilet.  IPSS 15  QoL score 3    Past Medical History:  Diagnosis Date  . Accelerated hypertension 06/29/2014  . Arthritis    Back  . Chronic kidney disease   . Dizziness and giddiness   . Enlarged prostate   . Esophagitis 05/10/2011  . Foley catheter in place   . FOOT SPRAIN, RIGHT 07/18/2007   Qualifier: Diagnosis of  By: Aline Brochure MD, [name]    . GERD (gastroesophageal reflux disease)   . HOH (hard of hearing)   . Hx-TIA (transient ischemic attack)    hx of this- wife states 10 years ago-but Dr. Estil Daft told wife he had had several-has 2 stents in carotid artery-left  . Hypercholesteremia   . Hypertension   . Low back pain   . Nocturia   . Shingles   . Stroke [facility])    had a stroke 05/23/2014  . Syncope and collapse     Past Surgical History:  Procedure Laterality Date  . carotid stents  05/2014   2 stents in carotid artery on left side  . COLONOSCOPY    . COLONOSCOPY  05/17/2011   Procedure: COLONOSCOPY;  Surgeon: [name], MD;  [facility];  Service: Endoscopy;  Laterality: N/A;  830  . epidural steroid injection  05/22/2014   had this in lumbar and at 1 am on 05/23/2014 had a stroke  . ESOPHAGOGASTRODUODENOSCOPY  10/28/2010   Procedure: ESOPHAGOGASTRODUODENOSCOPY (EGD);  Surgeon: [name], MD;  [facility];  Service: Endoscopy;  Laterality: N/A;  10:30  . ESOPHAGOGASTRODUODENOSCOPY N/A 04/24/2014   Procedure: ESOPHAGOGASTRODUODENOSCOPY (EGD);  Surgeon: [name], MD;  [facility];   Service: Endoscopy;  Laterality: N/A;  815  . ESOPHAGOGASTRODUODENOSCOPY N/A 06/26/2014   Procedure: ESOPHAGOGASTRODUODENOSCOPY (EGD);  Surgeon: [name], MD;  Location: [facility];  Service: Endoscopy;  Laterality: N/A;  . EUS N/A 06/26/2014   Procedure: FULL UPPER ENDOSCOPIC ULTRASOUND (EUS) RADIAL;  Surgeon: [name], MD;  [facility];  Service: Endoscopy;  Laterality: N/A;  . IR GENERIC HISTORICAL  10/12/2015   IR ANGIO VERTEBRAL SEL VERTEBRAL BILAT MOD SED 10/12/2015 Luanne Bras, MD [facility]  . IR GENERIC HISTORICAL  10/12/2015   IR ANGIO INTRA EXTRACRAN SEL COM CAROTID INNOMINATE BILAT MOD SED 10/12/2015 Luanne Bras, MD [facility]  . RADIOLOGY WITH ANESTHESIA N/A 05/23/2014   Procedure: RADIOLOGY WITH ANESTHESIA;  Surgeon: Medication Radiologist, MD;  Location: [place];  Service: Radiology;  Laterality: N/A;  . RESECTION DISTAL CLAVICAL Right 12/13/2012   Procedure: RESECTION DISTAL CLAVICAL;  Surgeon: Carole Civil, MD;  Location: [facility];  Service: Orthopedics;  Laterality: Right;  . SHOULDER ARTHROSCOPY WITH BICEPSTENOTOMY Right 12/13/2012   Procedure: SHOULDER ARTHROSCOPY WITH BICEPS TENOTOMY, limited shoulder debridement;  Surgeon: Carole Civil, MD;  Location: [facility];  Service: Orthopedics;  Laterality: Right;  . SHOULDER SURGERY Right    torn cartiledge  . SHOULDER SURGERY     right shoulder  . TRANSURETHRAL  RESECTION OF PROSTATE N/A 07/09/2014   Procedure: TRANSURETHRAL RESECTION of prostate WITH BUTTON ;  Surgeon: Bjorn Pippin, MD;  [facility];  Service: Urology;  Laterality: N/A;    Home Medications:  Allergies as of 02/17/2020   No Known Allergies     Medication List       Accurate as of February 17, 2020 10:08 AM. If you have any questions, ask your nurse or doctor.        ALPRAZolam 0.25 MG tablet Commonly known as: XANAX Take 0.25 mg by mouth at bedtime as needed.   aspirin EC 81 MG tablet Take 81 mg by mouth daily.    atorvastatin 40 MG tablet Commonly known as: LIPITOR Take 1 tablet (40 mg total) by mouth every evening.   clopidogrel 75 MG tablet Commonly known as: PLAVIX TAKE 1 TABLET DAILY What changed: when to take this   finasteride 5 MG tablet Commonly known as: Proscar Take 1 tablet (5 mg total) by mouth daily. What changed: when to take this   hydrALAZINE 25 MG tablet Commonly known as: APRESOLINE TAKE 1 TABLET THREE TIMES A DAY What changed: when to take this   olmesartan 40 MG tablet Commonly known as: BENICAR TAKE 1 TABLET DAILY   pantoprazole 40 MG tablet Commonly known as: PROTONIX Take 40 mg by mouth daily.   polyethylene glycol 17 g packet Commonly known as: MIRALAX / GLYCOLAX Take 17 g by mouth every morning.   Refresh Celluvisc 1 % Gel Generic drug: Carboxymethylcellulose Sod PF as needed.   spironolactone 25 MG tablet Commonly known as: ALDACTONE Take 25 mg by mouth daily.   VITAMIN D PO Take 1 capsule by mouth every evening.       Allergies: No Known Allergies  Family History  Problem Relation Age of Onset  . Thyroid disease Mother   . Colon cancer Neg Hx     Social History:  reports that he quit smoking about 36 years ago. His smoking use included cigarettes. He has a 17.50 pack-year smoking history. He has never used smokeless tobacco. He reports current alcohol use of about 3.0 standard drinks of alcohol per week. He reports that he does not use drugs.  ROS: Per nursing note, reviewed  Physical Exam:  Vital signs in last 24 hours: BP 130/73   Pulse 85   Temp 97.6 F (36.4 C)   Ht 5' 7.5" (1.715 m)   Wt 230 lb (104.3 kg)   BMI 35.49 kg/m  Constitutional:  Alert and oriented, No acute distress Cardiovascular: Regular rate  Respiratory: Normal respiratory effort GI: Obese, no inguinal hernias.  Genitourinary: Normal male phallus, testes are descended bilaterally and non-tender and without masses, scrotum is normal in appearance without  lesions or masses, perineum is normal on inspection.  Uncircumcised. Lymphatic: No lymphadenopathy Neurologic: Grossly intact, no focal deficits Psychiatric: Normal mood and affect  I have reviewed prior pt notes  I have reviewed notes from referring/previous physicians  I have reviewed urinalysis results   Impression/Assessment:  1.  BPH, minimal/moderate symptomatology, on finasteride.  Normal DRE today  2.  History of gross hematuria, most likely from prostate source, on finasteride, cleared  Plan:  1.  Finasteride was refilled  2.  I will see back in a year for recheck

## 2020-02-17 NOTE — Progress Notes (Signed)
Urological Symptom Review  Patient is experiencing the following symptoms: Frequent urination Hard to postpone urination Get up at night to urinate Leakage of urine   Review of Systems  Gastrointestinal (upper)  : Negative for upper GI symptoms  Gastrointestinal (lower) : Constipation  Constitutional : Negative for symptoms  Skin: Itching  Eyes: Negative for eye symptoms  Ear/Nose/Throat : Negative for Ear/Nose/Throat symptoms  Hematologic/Lymphatic: Negative for Hematologic/Lymphatic symptoms  Cardiovascular : Negative for cardiovascular symptoms  Respiratory : Negative for respiratory symptoms  Endocrine: Negative for endocrine symptoms  Musculoskeletal: Back pain  Neurological: Negative for neurological symptoms  Psychologic: Negative for psychiatric symptoms

## 2020-03-22 DIAGNOSIS — M5136 Other intervertebral disc degeneration, lumbar region: Secondary | ICD-10-CM | POA: Diagnosis not present

## 2020-03-22 DIAGNOSIS — M546 Pain in thoracic spine: Secondary | ICD-10-CM | POA: Diagnosis not present

## 2020-03-22 DIAGNOSIS — M9905 Segmental and somatic dysfunction of pelvic region: Secondary | ICD-10-CM | POA: Diagnosis not present

## 2020-03-22 DIAGNOSIS — M9903 Segmental and somatic dysfunction of lumbar region: Secondary | ICD-10-CM | POA: Diagnosis not present

## 2020-03-22 DIAGNOSIS — M9902 Segmental and somatic dysfunction of thoracic region: Secondary | ICD-10-CM | POA: Diagnosis not present

## 2020-04-28 DIAGNOSIS — M9905 Segmental and somatic dysfunction of pelvic region: Secondary | ICD-10-CM | POA: Diagnosis not present

## 2020-04-28 DIAGNOSIS — M9902 Segmental and somatic dysfunction of thoracic region: Secondary | ICD-10-CM | POA: Diagnosis not present

## 2020-04-28 DIAGNOSIS — M9903 Segmental and somatic dysfunction of lumbar region: Secondary | ICD-10-CM | POA: Diagnosis not present

## 2020-04-28 DIAGNOSIS — M5136 Other intervertebral disc degeneration, lumbar region: Secondary | ICD-10-CM | POA: Diagnosis not present

## 2020-04-28 DIAGNOSIS — M546 Pain in thoracic spine: Secondary | ICD-10-CM | POA: Diagnosis not present

## 2020-06-03 DIAGNOSIS — Z23 Encounter for immunization: Secondary | ICD-10-CM | POA: Diagnosis not present

## 2020-06-15 ENCOUNTER — Encounter: Payer: Self-pay | Admitting: Cardiology

## 2020-06-15 ENCOUNTER — Other Ambulatory Visit: Payer: Self-pay

## 2020-06-15 ENCOUNTER — Ambulatory Visit: Payer: Medicare Other | Admitting: Cardiology

## 2020-06-15 VITALS — BP 115/69 | HR 84 | Temp 97.3°F | Resp 16 | Ht 67.0 in | Wt 235.0 lb

## 2020-06-15 DIAGNOSIS — E782 Mixed hyperlipidemia: Secondary | ICD-10-CM

## 2020-06-15 DIAGNOSIS — I10 Essential (primary) hypertension: Secondary | ICD-10-CM | POA: Diagnosis not present

## 2020-06-15 DIAGNOSIS — I6522 Occlusion and stenosis of left carotid artery: Secondary | ICD-10-CM

## 2020-06-15 DIAGNOSIS — Z6837 Body mass index (BMI) 37.0-37.9, adult: Secondary | ICD-10-CM | POA: Diagnosis not present

## 2020-06-15 DIAGNOSIS — Z87891 Personal history of nicotine dependence: Secondary | ICD-10-CM

## 2020-06-15 DIAGNOSIS — Z8673 Personal history of transient ischemic attack (TIA), and cerebral infarction without residual deficits: Secondary | ICD-10-CM

## 2020-06-15 NOTE — Progress Notes (Signed)
Henry [name] Date of Birth: Jan 06, 1935 MRN: [id number] Primary Care Provider:Paterson, Quillian Quince, MD Former Cardiology Providers: Jeri Lager, APRN, FNP-C Primary Cardiologist: Rex Kras, DO, [facility] (established care 06/19/2019)  Date: 06/15/20 Last Office Visit: 01/19/2020  Chief Complaint  Patient presents with  . Atherosclerosis of left carotid artery  . Follow-up    HPI  Henry [name] is a 86 y.o.  male who presents to the office with a chief complaint of " 59-monthfollow-up for carotid disease." Patient's past medical history and cardiovascular risk factors include: Hyperlipidemia, hypertension, hyperglycemia, history of carotid artery disease status post stenting, history of CVA in 2016, history of syncope, advanced age, obesity due to excess calories.  Patient is accompanied by his wife at today's office visit.    Since last office visit patient states that he is doing well from a cardiovascular standpoint.  No hospitalizations or urgent care visits for cardiac symptoms.  Functional status remains stable.  Patient continues to take his pharmacological therapy without any side effects or intolerances.  He has a known history of left ICA stent stenting and was supposed to have a carotid duplex in April 2022.  However it has not been performed at the time of today's office visit.  Patient denies any acute vision changes involving the left eye or focal neurological deficits involving contralateral body.  FUNCTIONAL STATUS: He walks 1.5 - 2 miles three times a week.    ALLERGIES: No Known Allergies   MEDICATION LIST PRIOR TO VISIT: Current Outpatient Medications on File Prior to Visit  Medication Sig Dispense Refill  . ALPRAZolam (XANAX) 0.25 MG tablet Take 0.25 mg by mouth at bedtime as needed.    .Marland Kitchenaspirin EC 81 MG tablet Take 81 mg by mouth daily.    .Marland Kitchenatorvastatin (LIPITOR) 40 MG tablet Take 1 tablet (40 mg total) by mouth every evening. 30 tablet 1  .  Cholecalciferol (VITAMIN D PO) Take 1 capsule by mouth every evening.    . clopidogrel (PLAVIX) 75 MG tablet TAKE 1 TABLET DAILY (Patient taking differently: Take 75 mg by mouth every morning.) 90 tablet 3  . finasteride (PROSCAR) 5 MG tablet Take 1 tablet (5 mg total) by mouth daily. 90 tablet 3  . hydrALAZINE (APRESOLINE) 25 MG tablet TAKE 1 TABLET THREE TIMES A DAY (Patient taking differently: Take 25 mg by mouth in the morning and at bedtime.) 270 tablet 3  . olmesartan (BENICAR) 40 MG tablet TAKE 1 TABLET DAILY 90 tablet 3  . pantoprazole (PROTONIX) 40 MG tablet Take 40 mg by mouth daily.    . polyethylene glycol (MIRALAX / GLYCOLAX) packet Take 17 g by mouth every morning.    .Marland KitchenREFRESH CELLUVISC 1 % GEL as needed.     .Marland Kitchenspironolactone (ALDACTONE) 25 MG tablet Take 25 mg by mouth daily.     No current facility-administered medications on file prior to visit.    PAST MEDICAL HISTORY: Past Medical History:  Diagnosis Date  . Accelerated hypertension 06/29/2014  . Arthritis    Back  . Chronic kidney disease   . Dizziness and giddiness   . Enlarged prostate   . Esophagitis 05/10/2011  . Foley catheter in place   . FOOT SPRAIN, RIGHT 07/18/2007   Qualifier: Diagnosis of  By: HAline BrochureMD, SDorothyann Peng   . GERD (gastroesophageal reflux disease)   . HOH (hard of hearing)   . Hx-TIA (transient ischemic attack)    hx of this- wife states 10 years ago-but  Dr. Estil Daft told wife he had had several-has 2 stents in carotid artery-left  . Hypercholesteremia   . Hypertension   . Low back pain   . Nocturia   . Shingles   . Stroke [facility])    had a stroke 05/23/2014  . Syncope and collapse     PAST SURGICAL HISTORY: Past Surgical History:  Procedure Laterality Date  . carotid stents  05/2014   2 stents in carotid artery on left side  . COLONOSCOPY    . COLONOSCOPY  05/17/2011   Procedure: COLONOSCOPY;  Surgeon: [name], MD;  [facility];  Service: Endoscopy;  Laterality: N/A;   830  . epidural steroid injection  05/22/2014   had this in lumbar and at 1 am on 05/23/2014 had a stroke  . ESOPHAGOGASTRODUODENOSCOPY  10/28/2010   Procedure: ESOPHAGOGASTRODUODENOSCOPY (EGD);  Surgeon: [name], MD;  [facility];  Service: Endoscopy;  Laterality: N/A;  10:30  . ESOPHAGOGASTRODUODENOSCOPY N/A 04/24/2014   Procedure: ESOPHAGOGASTRODUODENOSCOPY (EGD);  Surgeon: [name], MD;  [facility];  Service: Endoscopy;  Laterality: N/A;  815  . ESOPHAGOGASTRODUODENOSCOPY N/A 06/26/2014   Procedure: ESOPHAGOGASTRODUODENOSCOPY (EGD);  Surgeon: [name], MD;  Location: [facility];  Service: Endoscopy;  Laterality: N/A;  . EUS N/A 06/26/2014   Procedure: FULL UPPER ENDOSCOPIC ULTRASOUND (EUS) RADIAL;  Surgeon: [name], MD;  Location: [facility];  Service: Endoscopy;  Laterality: N/A;  . IR GENERIC HISTORICAL  10/12/2015   IR ANGIO VERTEBRAL SEL VERTEBRAL BILAT MOD SED 10/12/2015 Luanne Bras, MD [facility]  . IR GENERIC HISTORICAL  10/12/2015   IR ANGIO INTRA EXTRACRAN SEL COM CAROTID INNOMINATE BILAT MOD SED 10/12/2015 Luanne Bras, MD [facility]  . RADIOLOGY WITH ANESTHESIA N/A 05/23/2014   Procedure: RADIOLOGY WITH ANESTHESIA;  Surgeon: Medication Radiologist, MD;  Location: [place];  Service: Radiology;  Laterality: N/A;  . RESECTION DISTAL CLAVICAL Right 12/13/2012   Procedure: RESECTION DISTAL CLAVICAL;  Surgeon: Carole Civil, MD;  Location: [facility];  Service: Orthopedics;  Laterality: Right;  . SHOULDER ARTHROSCOPY WITH BICEPSTENOTOMY Right 12/13/2012   Procedure: SHOULDER ARTHROSCOPY WITH BICEPS TENOTOMY, limited shoulder debridement;  Surgeon: Carole Civil, MD;  Location: [facility];  Service: Orthopedics;  Laterality: Right;  . SHOULDER SURGERY Right    torn cartiledge  . SHOULDER SURGERY     right shoulder  . TRANSURETHRAL RESECTION OF PROSTATE N/A 07/09/2014   Procedure: TRANSURETHRAL RESECTION of prostate WITH  BUTTON ;  Surgeon: [name], MD;  Location: [facility];  Service: Urology;  Laterality: N/A;    FAMILY HISTORY: The patient's family history includes Thyroid disease in his mother.   SOCIAL HISTORY:  The patient  reports that he quit smoking about 36 years ago. His smoking use included cigarettes. He has a 17.50 pack-year smoking history. He has never used smokeless tobacco. He reports current alcohol use of about 3.0 standard drinks of alcohol per week. He reports that he does not use drugs.  Review of Systems  Constitutional: Negative for chills and fever.  HENT: Negative for hoarse voice and nosebleeds.   Eyes: Negative for discharge, double vision and pain.  Cardiovascular: Negative for chest pain, claudication, dyspnea on exertion, leg swelling, near-syncope, orthopnea, palpitations, paroxysmal nocturnal dyspnea and syncope.  Respiratory: Negative for hemoptysis and shortness of breath.   Musculoskeletal: Negative for muscle cramps and myalgias.  Gastrointestinal: Negative for abdominal pain, constipation, diarrhea, hematemesis, hematochezia, melena, nausea and vomiting.  Neurological: Negative  for dizziness and light-headedness.   PHYSICAL EXAM: Vitals with BMI 06/15/2020 02/17/2020 01/19/2020  Height _0  5' 7.5" _1   Weight 235 lbs 230 lbs 242 lbs  BMI 36.8 29.51 88.41  Systolic 660 630 160  Diastolic 69 73 74  Pulse 84 85 72    CONSTITUTIONAL: Well-developed and well-nourished. No acute distress.  SKIN: Skin is warm and dry. No rash noted. No cyanosis. No pallor. No jaundice HEAD: Normocephalic and atraumatic.  EYES: No scleral icterus MOUTH/THROAT: Moist oral membranes.  NECK: No JVD present. No thyromegaly noted.  Right carotid bruit. LYMPHATIC: No visible cervical adenopathy.  CHEST Normal respiratory effort. No intercostal retractions  LUNGS: Clear to auscultation bilaterally.  No stridor. No wheezes. No rales.  CARDIOVASCULAR: Regular rate and rhythm, positive F0-X3,  3+ systolic ejection murmur heard second intercostal space, no rubs or gallops appreciated. ABDOMINAL: Obese, soft, nontender, nondistended, positive bowel sounds all 4 quadrants. No apparent ascites.  EXTREMITIES: No peripheral edema  HEMATOLOGIC: No significant bruising NEUROLOGIC: Oriented to person, place, and time. Nonfocal. Normal muscle tone.  PSYCHIATRIC: Normal mood and affect. Normal behavior. Cooperative  CARDIAC DATABASE: EKG: 01/19/2020: Normal sinus rhythm, low voltage in the precordial leads, without underlying ischemia or injury pattern.   Echocardiogram 12/11/2018: Left ventricle cavity is normal in size. Mild concentric hypertrophy of the left ventricle. Normal global wall motion. Normal LV systolic function with EF 60%. Doppler evidence of grade I (impaired) diastolic dysfunction, normal LAP.  Trileaflet aortic valve with mild calcification of the aortic valve annulus and leaflets. Mild aortic valve stenosis. No regurgitation noted. Aortic valve mean gradient of 8 mmHg, Vmax of 2.0 m/s. Calculated aortic valve area by continuity equation is 1.6 cm. Mild (Grade I) mitral regurgitation.  Mild tricuspid regurgitation. No evidence of pulmonary hypertension. No significant change compares to previous study on 05/25/2016.  Event Monitor 30 days 02/19/2015: NSR, No symptoms reported. No A. Fib or heart block.  Lexiscan myoview stress test 02/26/2015: 1. The resting electrocardiogram demonstrated normal sinus rhythm, incomplete RBBB and no resting arrhythmias. Stress EKG is non-diagnostic for ischemia as it a pharmacologic stress using Lexiscan.Stress symptoms included dyspnea. 2. The perfusion imaging study demonstrates a very small sized inferolateral and inferoapical subtle ischemia. LVEF was normal without regional wall motion at 77%. This represents a low risk study.  Carotid duplex: 11/18/2019: Right Carotid: Velocities in the right ICA are consistent with a 40-59%  stenosis. Hemodynamically significant plaque >50% visualized in the CCA.  Left Carotid: The left ICA stent appears <50% stenosed.   LABORATORY DATA: External Labs: Collected: 01/07/2020 performed at [place] [facility] Creatinine 1.0 mg/dL. eGFR: 70 mL/min per 1.73 m Sodium 130, potassium 4.9, chloride 94, bicarb 23 Hemoglobin 14.3 g/dL and hematocrit 44% Lipid profile: Total cholesterol 132, triglycerides 52, HDL 63, LDL 59, non-HDL 69  FINAL MEDICATION LIST END OF ENCOUNTER: No orders of the defined types were placed in this encounter.   There are no discontinued medications.   Current Outpatient Medications:  .  ALPRAZolam (XANAX) 0.25 MG tablet, Take 0.25 mg by mouth at bedtime as needed., Disp: , Rfl:  .  aspirin EC 81 MG tablet, Take 81 mg by mouth daily., Disp: , Rfl:  .  atorvastatin (LIPITOR) 40 MG tablet, Take 1 tablet (40 mg total) by mouth every evening., Disp: 30 tablet, Rfl: 1 .  Cholecalciferol (VITAMIN D PO), Take 1 capsule by mouth every evening., Disp: , Rfl:  .  clopidogrel (PLAVIX) 75 MG tablet, TAKE  1 TABLET DAILY (Patient taking differently: Take 75 mg by mouth every morning.), Disp: 90 tablet, Rfl: 3 .  finasteride (PROSCAR) 5 MG tablet, Take 1 tablet (5 mg total) by mouth daily., Disp: 90 tablet, Rfl: 3 .  hydrALAZINE (APRESOLINE) 25 MG tablet, TAKE 1 TABLET THREE TIMES A DAY (Patient taking differently: Take 25 mg by mouth in the morning and at bedtime.), Disp: 270 tablet, Rfl: 3 .  olmesartan (BENICAR) 40 MG tablet, TAKE 1 TABLET DAILY, Disp: 90 tablet, Rfl: 3 .  pantoprazole (PROTONIX) 40 MG tablet, Take 40 mg by mouth daily., Disp: , Rfl:  .  polyethylene glycol (MIRALAX / GLYCOLAX) packet, Take 17 g by mouth every morning., Disp: , Rfl:  .  REFRESH CELLUVISC 1 % GEL, as needed. , Disp: , Rfl:  .  spironolactone (ALDACTONE) 25 MG tablet, Take 25 mg by mouth daily., Disp: , Rfl:   IMPRESSION:    ICD-10-CM   1. Atherosclerosis of left carotid  artery  I65.22   2. Benign hypertension  I10   3. Mixed hyperlipidemia  E78.2   4. Hx of  stroke  Z86.73   5. Former smoker  Z87.891   77. Class 2 severe obesity due to excess calories with serious comorbidity and body mass index (BMI) of 37.0 to 37.9 in adult [facility])  E66.01    Z68.37      RECOMMENDATIONS: Henry [name] is a 86 y.o. male whose past medical history and cardiovascular risk factors include: Hyperlipidemia, hypertension, hyperglycemia, history of carotid artery disease status post LICA stenting, history of CVA in 2016, history of syncope, advanced age, obesity due to excess calories.  Atherosclerosis of the left carotid artery status post stenting:   Continue antiplatelet and statin therapy.    Scheduled to have a carotid duplex to reevaluate the progression of disease involving the right ICA.  Will reach out to ultrasound department to have his carotid duplex rescheduled.  Orders have already been placed twice.  Patient is educated on looking up her symptoms of ipsilateral vision changes and contralateral focal neurological deficits.  If such symptoms arise they are educated to seek medical attention by going to the closest ER via EMS as these symptoms suggest possible stroke.  Both patient and wife verbalized understanding  Benign essential hypertension:  Well controlled.  Medications reconciled.  Low-salt diet recommended.  Continue to monitor  Mixed hyperlipidemia: Continue statin therapy.  Independently reviewed outside labs including lipid profile.  History of CVA: Currently on dual antiplatelet therapy.  Continue statin therapy.  Continue aggressive risk factor modifications.  Former smoker: Educated on the importance of continued smoking cessation.  No orders of the defined types were placed in this encounter.  --Continue cardiac medications as reconciled in final medication list. --Return in about 6 months (around 12/16/2020) for Follow up carotid disease . Or  sooner if needed. --Continue follow-up with your primary care physician regarding the management of your other chronic comorbid conditions.  Patient's questions and concerns were addressed to his satisfaction. He voices understanding of the instructions provided during this encounter.   This note was created using a voice recognition software as a result there may be grammatical errors inadvertently enclosed that do not reflect the nature of this encounter. Every attempt is made to correct such errors.  Total time spent: 21 minutes  Mechele Claude [facility]  Pager: [phone] Office: [phone]

## 2020-06-17 ENCOUNTER — Telehealth (HOSPITAL_COMMUNITY): Payer: Self-pay

## 2020-06-17 NOTE — Telephone Encounter (Signed)
Spoke to pt's wife. Will call back next week after ultrasound has been completed. AW

## 2020-06-21 DIAGNOSIS — M9902 Segmental and somatic dysfunction of thoracic region: Secondary | ICD-10-CM | POA: Diagnosis not present

## 2020-06-21 DIAGNOSIS — M9905 Segmental and somatic dysfunction of pelvic region: Secondary | ICD-10-CM | POA: Diagnosis not present

## 2020-06-21 DIAGNOSIS — M5136 Other intervertebral disc degeneration, lumbar region: Secondary | ICD-10-CM | POA: Diagnosis not present

## 2020-06-21 DIAGNOSIS — M9903 Segmental and somatic dysfunction of lumbar region: Secondary | ICD-10-CM | POA: Diagnosis not present

## 2020-06-21 DIAGNOSIS — M546 Pain in thoracic spine: Secondary | ICD-10-CM | POA: Diagnosis not present

## 2020-06-22 ENCOUNTER — Other Ambulatory Visit (HOSPITAL_COMMUNITY): Payer: Self-pay | Admitting: Student

## 2020-06-22 ENCOUNTER — Other Ambulatory Visit: Payer: Self-pay

## 2020-06-22 ENCOUNTER — Ambulatory Visit (HOSPITAL_COMMUNITY)
Admission: RE | Admit: 2020-06-22 | Discharge: 2020-06-22 | Disposition: A | Discharge status: Home / Self Care | Payer: Medicare Other | Source: Ambulatory Visit | Attending: Cardiology | Admitting: Cardiology

## 2020-06-22 DIAGNOSIS — I6522 Occlusion and stenosis of left carotid artery: Secondary | ICD-10-CM | POA: Insufficient documentation

## 2020-06-22 NOTE — Progress Notes (Signed)
Carotid duplex bilateral study completed.   Please see CV Proc for preliminary results.   Jeneal Vogl, RDMS, RVT  

## 2020-06-23 NOTE — Progress Notes (Signed)
Patient's wife called back she was told results

## 2020-06-23 NOTE — Progress Notes (Signed)
No answer left a vm to call back

## 2020-06-28 ENCOUNTER — Telehealth (HOSPITAL_COMMUNITY): Payer: Self-pay

## 2020-06-28 NOTE — Telephone Encounter (Signed)
[name] agreed to f/u in 6 months with us carotid. AW 

## 2020-08-04 DIAGNOSIS — M546 Pain in thoracic spine: Secondary | ICD-10-CM | POA: Diagnosis not present

## 2020-08-04 DIAGNOSIS — M9902 Segmental and somatic dysfunction of thoracic region: Secondary | ICD-10-CM | POA: Diagnosis not present

## 2020-08-04 DIAGNOSIS — M5136 Other intervertebral disc degeneration, lumbar region: Secondary | ICD-10-CM | POA: Diagnosis not present

## 2020-08-04 DIAGNOSIS — M9905 Segmental and somatic dysfunction of pelvic region: Secondary | ICD-10-CM | POA: Diagnosis not present

## 2020-08-04 DIAGNOSIS — M9903 Segmental and somatic dysfunction of lumbar region: Secondary | ICD-10-CM | POA: Diagnosis not present

## 2020-08-05 ENCOUNTER — Ambulatory Visit (INDEPENDENT_AMBULATORY_CARE_PROVIDER_SITE_OTHER): Payer: Medicare Other | Admitting: Adult Health

## 2020-08-05 ENCOUNTER — Other Ambulatory Visit: Payer: Self-pay

## 2020-08-05 VITALS — BP 147/75 | HR 65 | Ht 67.0 in | Wt 246.0 lb

## 2020-08-05 DIAGNOSIS — I6522 Occlusion and stenosis of left carotid artery: Secondary | ICD-10-CM | POA: Diagnosis not present

## 2020-08-05 DIAGNOSIS — Z9989 Dependence on other enabling machines and devices: Secondary | ICD-10-CM

## 2020-08-05 DIAGNOSIS — G4733 Obstructive sleep apnea (adult) (pediatric): Secondary | ICD-10-CM

## 2020-08-05 NOTE — Patient Instructions (Signed)

## 2020-08-05 NOTE — Progress Notes (Addendum)
PATIENT: Henry [name] DOB: 09/10/1934  REASON FOR VISIT: follow up HISTORY FROM: patient Primary neurologist: Dr. Rexene Alberts  HISTORY OF PRESENT ILLNESS: Today 08/05/20:  Mr. Richter is an 86 year old male with a history of obstructive sleep apnea on CPAP.  He returns today for follow-up.  He reports that the CPAP continues to work well for him.  He denies any new issues.  He returns today for an evaluation.    07/31/19: Mr. Harriott is an 86 year old male with a history of obstructive sleep apnea on CPAP.  His download indicates that he use his machine nightly for compliance of 100%.  He uses machine greater than 4 hours each night.  On average he uses his machine 8 hours and 16 minutes.  His residual AHI is 3.9 on 9 cm of water with EPR 3.  Leak in the 95th percentile is 11.1.  He reports that the CPAP continues to work well for him.  He returns today for an evaluation.  HISTORY 07/31/18   Mr. Thone is an 86 year old male with a history of obstructive sleep apnea on CPAP.  His download indicates that he uses machine nightly and greater than 4 hours each night.  On average he uses his machine 8 hours and 19 minutes.  His residual AHI is 6.2 on 8 cm of water with EPR of 3.  He reports that he is tolerating the machine well.  He does not feel the mask leaking at night.  Denies any new issues.  REVIEW OF SYSTEMS: Out of a complete 14 system review of symptoms, the patient complains only of the following symptoms, and all other reviewed systems are negative.  ESS  1  ALLERGIES: No Known Allergies  HOME MEDICATIONS: Outpatient Medications Prior to Visit  Medication Sig Dispense Refill   ALPRAZolam (XANAX) 0.25 MG tablet Take 0.25 mg by mouth at bedtime as needed.     aspirin EC 81 MG tablet Take 81 mg by mouth daily.     atorvastatin (LIPITOR) 40 MG tablet Take 1 tablet (40 mg total) by mouth every evening. 30 tablet 1   Cholecalciferol (VITAMIN D PO) Take 1 capsule by mouth every  evening.     clopidogrel (PLAVIX) 75 MG tablet TAKE 1 TABLET DAILY (Patient taking differently: Take 75 mg by mouth every morning.) 90 tablet 3   finasteride (PROSCAR) 5 MG tablet Take 1 tablet (5 mg total) by mouth daily. 90 tablet 3   hydrALAZINE (APRESOLINE) 25 MG tablet TAKE 1 TABLET THREE TIMES A DAY (Patient taking differently: Take 25 mg by mouth in the morning and at bedtime.) 270 tablet 3   olmesartan (BENICAR) 40 MG tablet TAKE 1 TABLET DAILY 90 tablet 3   pantoprazole (PROTONIX) 40 MG tablet Take 40 mg by mouth daily.     polyethylene glycol (MIRALAX / GLYCOLAX) packet Take 17 g by mouth every morning.     REFRESH CELLUVISC 1 % GEL as needed.      spironolactone (ALDACTONE) 25 MG tablet Take 25 mg by mouth daily.     No facility-administered medications prior to visit.    PAST MEDICAL HISTORY: Past Medical History:  Diagnosis Date   Accelerated hypertension 06/29/2014   Arthritis    Back   Chronic kidney disease    Dizziness and giddiness    Enlarged prostate    Esophagitis 05/10/2011   Foley catheter in place    FOOT SPRAIN, RIGHT 07/18/2007   Qualifier: Diagnosis of  By: Aline Brochure MD,  [place]     GERD (gastroesophageal reflux disease)    HOH (hard of hearing)    Hx-TIA (transient ischemic attack)    hx of this- wife states 42 years ago-but Dr. Estil Daft told wife he had had several-has 2 stents in carotid artery-left   Hypercholesteremia    Hypertension    Low back pain    Nocturia    Shingles    Stroke [facility])    had a stroke 05/23/2014   Syncope and collapse     PAST SURGICAL HISTORY: Past Surgical History:  Procedure Laterality Date   carotid stents  05/2014   2 stents in carotid artery on left side   COLONOSCOPY     COLONOSCOPY  05/17/2011   Procedure: COLONOSCOPY;  Surgeon: [name], MD;  [facility];  Service: Endoscopy;  Laterality: N/A;  830   epidural steroid injection  05/22/2014   had this in lumbar and at 1 am on 05/23/2014 had a  stroke   ESOPHAGOGASTRODUODENOSCOPY  10/28/2010   Procedure: ESOPHAGOGASTRODUODENOSCOPY (EGD);  Surgeon: [name], MD;  [facility];  Service: Endoscopy;  Laterality: N/A;  10:30   ESOPHAGOGASTRODUODENOSCOPY N/A 04/24/2014   Procedure: ESOPHAGOGASTRODUODENOSCOPY (EGD);  Surgeon: [name], MD;  [facility];  Service: Endoscopy;  Laterality: N/A;  815   ESOPHAGOGASTRODUODENOSCOPY N/A 06/26/2014   Procedure: ESOPHAGOGASTRODUODENOSCOPY (EGD);  Surgeon: [name], MD;  Location: [facility];  Service: Endoscopy;  Laterality: N/A;   EUS N/A 06/26/2014   Procedure: FULL UPPER ENDOSCOPIC ULTRASOUND (EUS) RADIAL;  Surgeon: [name], MD;  Location: [facility];  Service: Endoscopy;  Laterality: N/A;   IR GENERIC HISTORICAL  10/12/2015   IR ANGIO VERTEBRAL SEL VERTEBRAL BILAT MOD SED 10/12/2015 Luanne Bras, MD [facility]   IR GENERIC HISTORICAL  10/12/2015   IR ANGIO INTRA EXTRACRAN SEL COM CAROTID INNOMINATE BILAT MOD SED 10/12/2015 Luanne Bras, MD [facility]   RADIOLOGY WITH ANESTHESIA N/A 05/23/2014   Procedure: RADIOLOGY WITH ANESTHESIA;  Surgeon: Medication Radiologist, MD;  Location: [facility];  Service: Radiology;  Laterality: N/A;   RESECTION DISTAL CLAVICAL Right 12/13/2012   Procedure: RESECTION DISTAL CLAVICAL;  Surgeon: Carole Civil, MD;  Location[facility];  Service: Orthopedics;  Laterality: Right;   SHOULDER ARTHROSCOPY WITH BICEPSTENOTOMY Right 12/13/2012   Procedure: SHOULDER ARTHROSCOPY WITH BICEPS TENOTOMY, limited shoulder debridement;  Surgeon: Carole Civil, MD;  Location[facility];  Service: Orthopedics;  Laterality: Right;   SHOULDER SURGERY Right    torn cartiledge   SHOULDER SURGERY     right shoulder   TRANSURETHRAL RESECTION OF PROSTATE N/A 07/09/2014   Procedure: TRANSURETHRAL RESECTION of prostate WITH BUTTON ;  Surgeon: [name], MD;  Location: [facility];  Service: Urology;  Laterality: N/A;    FAMILY  HISTORY: Family History  Problem Relation Age of Onset   Thyroid disease Mother    Colon cancer Neg Hx     SOCIAL HISTORY: Social History   Socioeconomic History   Marital status: Married    Spouse name: Not on file   Number of children: 4   Years of education: 16   Highest education level: Not on file  Occupational History   Occupation: retired    Comment: [employer]  Tobacco Use   Smoking status: Former    Packs/day: 0.50    Years: 35.00    Pack years: 17.50    Types: Cigarettes    Quit date: 1986    Years since  quitting: 36.4   Smokeless tobacco: Never  Vaping Use   Vaping Use: Never used  Substance and Sexual Activity   Alcohol use: Yes    Alcohol/week: 3.0 standard drinks    Types: 3 Glasses of wine per week   Drug use: No   Sexual activity: Yes    Birth control/protection: None  Other Topics Concern   Not on file  Social History Narrative   Married, retired - [employer]   Right handed   Caffeine use - 3-4 cups coffee daily   Social Determinants of Radio broadcast assistant Strain: Not on file  Food Insecurity: Not on file  Transportation Needs: Not on file  Physical Activity: Not on file  Stress: Not on file  Social Connections: Not on file  Intimate Partner Violence: Not on file      PHYSICAL EXAM  Vitals:   08/05/20 0915  BP: (!) 147/75  Pulse: 65  Weight: 246 lb (111.6 kg)  Height: 5\' 7"  (1.702 m)   Body mass index is 38.53 kg/m.  Generalized: Well developed, in no acute distress  Chest: Lungs clear to auscultation bilaterally  Neurological examination  Mentation: Alert oriented to time, place, history taking. Follows all commands speech and language fluent Cranial nerve II-XII: Extraocular movements were full, visual field were full on confrontational test Head turning and shoulder shrug  were normal and symmetric. Motor: The motor testing reveals 5 over 5 strength of all 4 extremities. Good symmetric motor tone is noted throughout.   Sensory: Sensory testing is intact to soft touch on all 4 extremities. No evidence of extinction is noted.  Gait and station: Gait is normal.    DIAGNOSTIC DATA (LABS, IMAGING, TESTING) - I reviewed patient records, labs, notes, testing and imaging myself where available.  Lab Results  Component Value Date   WBC 8.6 11/03/2017   HGB 12.9 (L) 11/03/2017   HCT 38.8 (L) 11/03/2017   MCV 85.8 11/03/2017   PLT 210 11/03/2017      Component Value Date/Time   NA 135 11/03/2017 0414   NA 141 10/11/2010 0000   K 4.2 11/03/2017 0414   CL 104 11/03/2017 0414   CO2 26 11/03/2017 0414   GLUCOSE 105 (H) 11/03/2017 0414   BUN 14 11/03/2017 0414   BUN 21 10/11/2010 0000   CREATININE 1.02 11/03/2017 0414   CALCIUM 8.5 (L) 11/03/2017 0414   PROT 6.2 (L) 11/03/2017 0414   ALBUMIN 3.3 (L) 11/03/2017 0414   AST 25 11/03/2017 0414   ALT 19 11/03/2017 0414   ALKPHOS 35 (L) 11/03/2017 0414   BILITOT 0.8 11/03/2017 0414   GFRNONAA >60 11/03/2017 0414   GFRAA >60 11/03/2017 0414   Lab Results  Component Value Date   CHOL 110 05/24/2014   HDL 35 (L) 05/24/2014   LDLCALC 59 05/24/2014   TRIG 79 05/24/2014   CHOLHDL 3.1 05/24/2014   Lab Results  Component Value Date   HGBA1C 6.2 (H) 05/24/2014    Lab Results  Component Value Date   TSH 0.73 10/11/2010      ASSESSMENT AND PLAN 86 y.o. year old male  has a past medical history of Accelerated hypertension (06/29/2014), Arthritis, Chronic kidney disease, Dizziness and giddiness, Enlarged prostate, Esophagitis (05/10/2011), Foley catheter in place, FOOT SPRAIN, RIGHT (07/18/2007), GERD (gastroesophageal reflux disease), HOH (hard of hearing), TIA (transient ischemic attack), Hypercholesteremia, Hypertension, Low back pain, Nocturia, Shingles, Stroke ([facility]), and Syncope and collapse. here with:  OSA on CPAP  - CPAP  compliance excellent - Good treatment of AHI  - Encourage patient to use CPAP nightly and > 4 hours each night - F/U in 1 year  or sooner if needed  Ward Givens, MSN, NP-C 08/05/2020, 9:18 AM [facility] [address], [place], [place] [address] [phone]  I reviewed the above note and documentation by the Nurse Practitioner and agree with the history, exam, assessment and plan as outlined above. I was available for consultation. Star Age, MD, PhD [facility])

## 2020-08-22 DIAGNOSIS — Z20822 Contact with and (suspected) exposure to covid-19: Secondary | ICD-10-CM | POA: Diagnosis not present

## 2020-09-02 DIAGNOSIS — R42 Dizziness and giddiness: Secondary | ICD-10-CM | POA: Diagnosis not present

## 2020-09-02 DIAGNOSIS — G4733 Obstructive sleep apnea (adult) (pediatric): Secondary | ICD-10-CM | POA: Diagnosis not present

## 2020-09-02 DIAGNOSIS — E669 Obesity, unspecified: Secondary | ICD-10-CM | POA: Diagnosis not present

## 2020-09-02 DIAGNOSIS — R55 Syncope and collapse: Secondary | ICD-10-CM | POA: Diagnosis not present

## 2020-09-02 DIAGNOSIS — I129 Hypertensive chronic kidney disease with stage 1 through stage 4 chronic kidney disease, or unspecified chronic kidney disease: Secondary | ICD-10-CM | POA: Diagnosis not present

## 2020-09-02 DIAGNOSIS — N1831 Chronic kidney disease, stage 3a: Secondary | ICD-10-CM | POA: Diagnosis not present

## 2020-09-08 DIAGNOSIS — M9903 Segmental and somatic dysfunction of lumbar region: Secondary | ICD-10-CM | POA: Diagnosis not present

## 2020-09-08 DIAGNOSIS — M9905 Segmental and somatic dysfunction of pelvic region: Secondary | ICD-10-CM | POA: Diagnosis not present

## 2020-09-08 DIAGNOSIS — M546 Pain in thoracic spine: Secondary | ICD-10-CM | POA: Diagnosis not present

## 2020-09-08 DIAGNOSIS — M9902 Segmental and somatic dysfunction of thoracic region: Secondary | ICD-10-CM | POA: Diagnosis not present

## 2020-09-08 DIAGNOSIS — M5136 Other intervertebral disc degeneration, lumbar region: Secondary | ICD-10-CM | POA: Diagnosis not present

## 2020-09-10 DIAGNOSIS — L600 Ingrowing nail: Secondary | ICD-10-CM | POA: Diagnosis not present

## 2020-09-15 ENCOUNTER — Encounter: Payer: Self-pay | Admitting: Podiatry

## 2020-09-15 ENCOUNTER — Other Ambulatory Visit: Payer: Self-pay

## 2020-09-15 ENCOUNTER — Ambulatory Visit (INDEPENDENT_AMBULATORY_CARE_PROVIDER_SITE_OTHER): Payer: Medicare Other | Admitting: Podiatry

## 2020-09-15 DIAGNOSIS — L600 Ingrowing nail: Secondary | ICD-10-CM | POA: Diagnosis not present

## 2020-09-15 NOTE — Progress Notes (Signed)
Subjective:  Patient ID: Henry [name], male    DOB: December 09, 1934,  MRN: [id number]  Chief Complaint  Patient presents with   Ingrown Toenail    Left hallux     86 y.o. male presents with the above complaint.  Patient presents with complaint left hallux medial border ingrown.  Patient states painful to touch it started 4 to 5 days ago it got little red and infected.  Patient went to the urgent care who had antibiotics that was given to him.  I encouraged him to complete the course.  He states that he also had a previous ingrown removed long time ago but since that has came back and is hurting him.  He would like to have it removed.  He is prediabetic.  He does not have any history of vascular problems male extremity he would like to have removed he has not seen anyone else.   Review of Systems: Negative except as noted in the HPI. Denies N/V/F/Ch.  Past Medical History:  Diagnosis Date   Accelerated hypertension 06/29/2014   Arthritis    Back   Chronic kidney disease    Dizziness and giddiness    Enlarged prostate    Esophagitis 05/10/2011   Foley catheter in place    FOOT SPRAIN, RIGHT 07/18/2007   Qualifier: Diagnosis of  By: Aline Brochure MD, [name]     GERD (gastroesophageal reflux disease)    HOH (hard of hearing)    Hx-TIA (transient ischemic attack)    hx of this- wife states 35 years ago-but Dr. Estil Daft told wife he had had several-has 2 stents in carotid artery-left   Hypercholesteremia    Hypertension    Low back pain    Nocturia    Shingles    Stroke [facility])    had a stroke 05/23/2014   Syncope and collapse     Current Outpatient Medications:    ALPRAZolam (XANAX) 0.25 MG tablet, Take 0.25 mg by mouth at bedtime as needed., Disp: , Rfl:    aspirin EC 81 MG tablet, Take 81 mg by mouth daily., Disp: , Rfl:    atorvastatin (LIPITOR) 40 MG tablet, Take 1 tablet (40 mg total) by mouth every evening., Disp: 30 tablet, Rfl: 1   Cholecalciferol (VITAMIN D PO), Take 1 capsule  by mouth every evening., Disp: , Rfl:    clopidogrel (PLAVIX) 75 MG tablet, TAKE 1 TABLET DAILY (Patient taking differently: Take 75 mg by mouth every morning.), Disp: 90 tablet, Rfl: 3   finasteride (PROSCAR) 5 MG tablet, Take 1 tablet (5 mg total) by mouth daily., Disp: 90 tablet, Rfl: 3   hydrALAZINE (APRESOLINE) 25 MG tablet, TAKE 1 TABLET THREE TIMES A DAY (Patient taking differently: Take 25 mg by mouth in the morning and at bedtime.), Disp: 270 tablet, Rfl: 3   olmesartan (BENICAR) 40 MG tablet, TAKE 1 TABLET DAILY, Disp: 90 tablet, Rfl: 3   pantoprazole (PROTONIX) 40 MG tablet, Take 40 mg by mouth daily., Disp: , Rfl:    polyethylene glycol (MIRALAX / GLYCOLAX) packet, Take 17 g by mouth every morning., Disp: , Rfl:    REFRESH CELLUVISC 1 % GEL, as needed. , Disp: , Rfl:    spironolactone (ALDACTONE) 25 MG tablet, Take 25 mg by mouth daily., Disp: , Rfl:   Social History   Tobacco Use  Smoking Status Former   Packs/day: 0.50   Years: 35.00   Pack years: 17.50   Types: Cigarettes   Quit date: 1986  Years since quitting: 36.6  Smokeless Tobacco Never    No Known Allergies Objective:  There were no vitals filed for this visit. There is no height or weight on file to calculate BMI. Constitutional Well developed. Well nourished.  Vascular Dorsalis pedis pulses palpable bilaterally. Posterior tibial pulses palpable bilaterally. Capillary refill normal to all digits.  No cyanosis or clubbing noted. Pedal hair growth normal.  Neurologic Normal speech. Oriented to person, place, and time. Epicritic sensation to light touch grossly present bilaterally.  Dermatologic Painful ingrowing nail at medial nail borders of the hallux nail left. No other open wounds. No skin lesions.  Orthopedic: Normal joint ROM without pain or crepitus bilaterally. No visible deformities. No bony tenderness.   Radiographs: None Assessment:   1. Ingrown left big toenail    Plan:  Patient was  evaluated and treated and all questions answered.  Ingrown Nail, left -Patient elects to proceed with minor surgery to remove ingrown toenail removal today. Consent reviewed and signed by patient. -Ingrown nail excised. See procedure note. -Educated on post-procedure care including soaking. Written instructions provided and reviewed. -Patient to follow up in 2 weeks for nail check.  Procedure: Excision of Ingrown Toenail Location: Left 1st toe medial nail borders. Anesthesia: Lidocaine 1% plain; 1.5 mL and Marcaine 0.5% plain; 1.5 mL, digital block. Skin Prep: Betadine. Dressing: Silvadene; telfa; dry, sterile, compression dressing. Technique: Following skin prep, the toe was exsanguinated and a tourniquet was secured at the base of the toe. The affected nail border was freed, split with a nail splitter, and excised. Chemical matrixectomy was then performed with phenol and irrigated out with alcohol. The tourniquet was then removed and sterile dressing applied. Disposition: Patient tolerated procedure well. Patient to return in 2 weeks for follow-up.   No follow-ups on file.

## 2020-10-08 ENCOUNTER — Other Ambulatory Visit: Payer: Self-pay

## 2020-10-08 MED ORDER — HYDRALAZINE HCL 25 MG PO TABS: 25.0000 mg | ORAL_TABLET | Freq: Three times a day (TID) | ORAL | 3 refills | Status: DC

## 2020-10-22 DIAGNOSIS — M9903 Segmental and somatic dysfunction of lumbar region: Secondary | ICD-10-CM | POA: Diagnosis not present

## 2020-10-22 DIAGNOSIS — M546 Pain in thoracic spine: Secondary | ICD-10-CM | POA: Diagnosis not present

## 2020-10-22 DIAGNOSIS — M9905 Segmental and somatic dysfunction of pelvic region: Secondary | ICD-10-CM | POA: Diagnosis not present

## 2020-10-22 DIAGNOSIS — M9902 Segmental and somatic dysfunction of thoracic region: Secondary | ICD-10-CM | POA: Diagnosis not present

## 2020-10-22 DIAGNOSIS — M5136 Other intervertebral disc degeneration, lumbar region: Secondary | ICD-10-CM | POA: Diagnosis not present

## 2020-11-30 DIAGNOSIS — Z23 Encounter for immunization: Secondary | ICD-10-CM | POA: Diagnosis not present

## 2020-12-09 ENCOUNTER — Other Ambulatory Visit: Payer: Self-pay | Admitting: Cardiology

## 2020-12-16 ENCOUNTER — Ambulatory Visit: Payer: TRICARE For Life (TFL) | Admitting: Cardiology

## 2020-12-22 DIAGNOSIS — M9905 Segmental and somatic dysfunction of pelvic region: Secondary | ICD-10-CM | POA: Diagnosis not present

## 2020-12-22 DIAGNOSIS — M546 Pain in thoracic spine: Secondary | ICD-10-CM | POA: Diagnosis not present

## 2020-12-22 DIAGNOSIS — M9903 Segmental and somatic dysfunction of lumbar region: Secondary | ICD-10-CM | POA: Diagnosis not present

## 2020-12-22 DIAGNOSIS — M5136 Other intervertebral disc degeneration, lumbar region: Secondary | ICD-10-CM | POA: Diagnosis not present

## 2020-12-22 DIAGNOSIS — M9902 Segmental and somatic dysfunction of thoracic region: Secondary | ICD-10-CM | POA: Diagnosis not present

## 2020-12-24 ENCOUNTER — Ambulatory Visit: Payer: Medicare Other | Admitting: Cardiology

## 2020-12-24 ENCOUNTER — Other Ambulatory Visit: Payer: Self-pay

## 2020-12-24 ENCOUNTER — Encounter: Payer: Self-pay | Admitting: Cardiology

## 2020-12-24 VITALS — BP 134/71 | HR 77 | Resp 16 | Ht 67.0 in | Wt 246.0 lb

## 2020-12-24 DIAGNOSIS — E782 Mixed hyperlipidemia: Secondary | ICD-10-CM | POA: Diagnosis not present

## 2020-12-24 DIAGNOSIS — Z8673 Personal history of transient ischemic attack (TIA), and cerebral infarction without residual deficits: Secondary | ICD-10-CM

## 2020-12-24 DIAGNOSIS — I6522 Occlusion and stenosis of left carotid artery: Secondary | ICD-10-CM

## 2020-12-24 DIAGNOSIS — Z87891 Personal history of nicotine dependence: Secondary | ICD-10-CM | POA: Diagnosis not present

## 2020-12-24 DIAGNOSIS — I10 Essential (primary) hypertension: Secondary | ICD-10-CM

## 2020-12-24 DIAGNOSIS — Z6838 Body mass index (BMI) 38.0-38.9, adult: Secondary | ICD-10-CM | POA: Diagnosis not present

## 2020-12-24 NOTE — Progress Notes (Signed)
Henry [name] Date of Birth: November 25, 1934 MRN: [id number] Primary Care Provider:Paterson, Quillian Quince, MD Former Cardiology Providers: Jeri Lager, APRN, FNP-C Primary Cardiologist: Rex Kras, DO, [facility] (established care 06/19/2019)  Date: 12/24/20 Last Office Visit: 06/15/2020   Chief Complaint  Patient presents with    carotid disease   Follow-up    HPI  Henry [name] is a 86 y.o.  male who presents to the office with a chief complaint of " 68-monthfollow-up for carotid disease." Patient's past medical history and cardiovascular risk factors include: Hyperlipidemia, hypertension, hyperglycemia, history of carotid artery disease status post stenting, history of CVA in 2016, history of syncope, advanced age, obesity due to excess calories.  Patient is accompanied by his wife at today's office visit.    Patient presents today for 648-monthollow-up for management of his carotid artery disease.  He had undergone left ICA stenting in the past and a repeat carotid duplex was performed in May 2022.  Results reviewed with him and his wife at today's visit.  Since last office visit patient remained stable from a cardiovascular standpoint.  He denies any chest pain or shortness of breath at rest or with effort related activities.  Patient states that his overall functional status from remains relatively stable but could be more active as he has gained 11 pounds over the last 6 months.   No urgent care or hospitalizations last office visit.  He denies any focal neurological deficits or vision changes.  He is compliant with his current medical therapy.  FUNCTIONAL STATUS: He walks 1.5 - 2 miles three times a week.    ALLERGIES: No Known Allergies   MEDICATION LIST PRIOR TO VISIT: Current Outpatient Medications on File Prior to Visit  Medication Sig Dispense Refill   ALPRAZolam (XANAX) 0.25 MG tablet Take 0.25 mg by mouth at bedtime as needed.     aspirin EC 81 MG tablet Take 81 mg by  mouth daily.     atorvastatin (LIPITOR) 40 MG tablet Take 1 tablet (40 mg total) by mouth every evening. 30 tablet 1   Cholecalciferol (VITAMIN D PO) Take 1 capsule by mouth every evening.     clopidogrel (PLAVIX) 75 MG tablet TAKE 1 TABLET DAILY (Patient taking differently: Take 75 mg by mouth every morning.) 90 tablet 3   finasteride (PROSCAR) 5 MG tablet Take 1 tablet (5 mg total) by mouth daily. 90 tablet 3   hydrALAZINE (APRESOLINE) 25 MG tablet Take 1 tablet (25 mg total) by mouth 3 (three) times daily. 270 tablet 3   olmesartan (BENICAR) 40 MG tablet TAKE 1 TABLET DAILY 90 tablet 3   pantoprazole (PROTONIX) 40 MG tablet Take 40 mg by mouth daily.     polyethylene glycol (MIRALAX / GLYCOLAX) packet Take 17 g by mouth every morning.     spironolactone (ALDACTONE) 25 MG tablet Take 25 mg by mouth daily.     No current facility-administered medications on file prior to visit.    PAST MEDICAL HISTORY: Past Medical History:  Diagnosis Date   Accelerated hypertension 06/29/2014   Arthritis    Back   Chronic kidney disease    Dizziness and giddiness    Enlarged prostate    Esophagitis 05/10/2011   Foley catheter in place    FOOT SPRAIN, RIGHT 07/18/2007   Qualifier: Diagnosis of  By: HaAline BrochureD, StDorothyann Peng   GERD (gastroesophageal reflux disease)    HOH (hard of hearing)    Hx-TIA (transient ischemic attack)  hx of this- wife states 36 years ago-but Dr. Estil Daft told wife he had had several-has 2 stents in carotid artery-left   Hypercholesteremia    Hypertension    Low back pain    Nocturia    Shingles    Stroke [facility])    had a stroke 05/23/2014   Syncope and collapse     PAST SURGICAL HISTORY: Past Surgical History:  Procedure Laterality Date   carotid stents  05/2014   2 stents in carotid artery on left side   COLONOSCOPY     COLONOSCOPY  05/17/2011   Procedure: COLONOSCOPY;  Surgeon: [name], MD;  [facility];  Service: Endoscopy;  Laterality: N/A;  830    epidural steroid injection  05/22/2014   had this in lumbar and at 1 am on 05/23/2014 had a stroke   ESOPHAGOGASTRODUODENOSCOPY  10/28/2010   Procedure: ESOPHAGOGASTRODUODENOSCOPY (EGD);  Surgeon: [name], MD;  [facility];  Service: Endoscopy;  Laterality: N/A;  10:30   ESOPHAGOGASTRODUODENOSCOPY N/A 04/24/2014   Procedure: ESOPHAGOGASTRODUODENOSCOPY (EGD);  Surgeon: [name], MD;  [facility];  Service: Endoscopy;  Laterality: N/A;  815   ESOPHAGOGASTRODUODENOSCOPY N/A 06/26/2014   Procedure: ESOPHAGOGASTRODUODENOSCOPY (EGD);  Surgeon: [name], MD;  [facility];  Service: Endoscopy;  Laterality: N/A;   EUS N/A 06/26/2014   Procedure: FULL UPPER ENDOSCOPIC ULTRASOUND (EUS) RADIAL;  Surgeon: [name], MD;  [facility];  Service: Endoscopy;  Laterality: N/A;   IR GENERIC HISTORICAL  10/12/2015   IR ANGIO VERTEBRAL SEL VERTEBRAL BILAT MOD SED 10/12/2015 Luanne Bras, MD [facility]   IR GENERIC HISTORICAL  10/12/2015   IR ANGIO INTRA EXTRACRAN SEL COM CAROTID INNOMINATE BILAT MOD SED 10/12/2015 Luanne Bras, MD [facility]   RADIOLOGY WITH ANESTHESIA N/A 05/23/2014   Procedure: RADIOLOGY WITH ANESTHESIA;  Surgeon: Medication Radiologist, MD;  Location: [place];  Service: Radiology;  Laterality: N/A;   RESECTION DISTAL CLAVICAL Right 12/13/2012   Procedure: RESECTION DISTAL CLAVICAL;  Surgeon: Carole Civil, MD;  [facility];  Service: Orthopedics;  Laterality: Right;   SHOULDER ARTHROSCOPY WITH BICEPSTENOTOMY Right 12/13/2012   Procedure: SHOULDER ARTHROSCOPY WITH BICEPS TENOTOMY, limited shoulder debridement;  Surgeon: Carole Civil, MD;  [facility];  Service: Orthopedics;  Laterality: Right;   SHOULDER SURGERY Right    torn cartiledge   SHOULDER SURGERY     right shoulder   TRANSURETHRAL RESECTION OF PROSTATE N/A 07/09/2014   Procedure: TRANSURETHRAL RESECTION of prostate WITH BUTTON ;  Surgeon:  [name], MD;  [facility];  Service: Urology;  Laterality: N/A;    FAMILY HISTORY: The patient's family history includes Thyroid disease in his mother.   SOCIAL HISTORY:  The patient  reports that he quit smoking about 36 years ago. His smoking use included cigarettes. He has a 17.50 pack-year smoking history. He has never used smokeless tobacco. He reports current alcohol use of about 3.0 standard drinks per week. He reports that he does not use drugs.  Review of Systems  Constitutional: Negative for chills and fever.  HENT:  Negative for hoarse voice and nosebleeds.   Eyes:  Negative for discharge, double vision and pain.  Cardiovascular:  Negative for chest pain, claudication, dyspnea on exertion, leg swelling, near-syncope, orthopnea, palpitations, paroxysmal nocturnal dyspnea and syncope.  Respiratory:  Negative for hemoptysis and shortness of breath.   Musculoskeletal:  Negative for muscle cramps and myalgias.  Gastrointestinal:  Negative for abdominal  pain, constipation, diarrhea, hematemesis, hematochezia, melena, nausea and vomiting.  Neurological:  Negative for dizziness and light-headedness.   PHYSICAL EXAM: Vitals with BMI 12/24/2020 08/05/2020 06/15/2020  Height 5' 7"  5' 7"  5' 7"   Weight 246 lbs 246 lbs 235 lbs  BMI 38.52 76.54 65.0  Systolic 354 656 812  Diastolic 71 75 69  Pulse 77 65 84    CONSTITUTIONAL: Well-developed and well-nourished. No acute distress.  SKIN: Skin is warm and dry. No rash noted. No cyanosis. No pallor. No jaundice HEAD: Normocephalic and atraumatic.  EYES: No scleral icterus MOUTH/THROAT: Moist oral membranes.  NECK: No JVD present. No thyromegaly noted.  Right carotid bruit. LYMPHATIC: No visible cervical adenopathy.  CHEST Normal respiratory effort. No intercostal retractions  LUNGS: Clear to auscultation bilaterally.  No stridor. No wheezes. No rales.  CARDIOVASCULAR: Regular rate and rhythm, positive X5-T7, 3+ systolic ejection  murmur heard second intercostal space, no rubs or gallops appreciated. ABDOMINAL: Obese, soft, nontender, nondistended, positive bowel sounds all 4 quadrants. No apparent ascites.  EXTREMITIES: No peripheral edema  HEMATOLOGIC: No significant bruising NEUROLOGIC: Oriented to person, place, and time. Nonfocal. Normal muscle tone.  PSYCHIATRIC: Normal mood and affect. Normal behavior. Cooperative  CARDIAC DATABASE: EKG: 01/19/2020: Normal sinus rhythm, low voltage in the precordial leads, without underlying ischemia or injury pattern.   Echocardiogram 12/11/2018: Left ventricle cavity is normal in size. Mild concentric hypertrophy of the left ventricle. Normal global wall motion. Normal LV systolic function with EF 60%. Doppler evidence of grade I (impaired) diastolic dysfunction, normal LAP.  Trileaflet aortic valve with mild calcification of the aortic valve annulus and leaflets. Mild aortic valve stenosis.  No regurgitation noted. Aortic valve mean gradient of 8 mmHg, Vmax of 2.0 m/s. Calculated aortic valve area by continuity equation is 1.6 cm. Mild (Grade I) mitral regurgitation.  Mild tricuspid regurgitation. No evidence of pulmonary hypertension. No significant change compares to previous study on 05/25/2016.   Event Monitor 30 days 02/19/2015: NSR, No symptoms reported. No A. Fib or heart block.   Lexiscan myoview stress test 02/26/2015: 1. The resting electrocardiogram demonstrated normal sinus rhythm, incomplete RBBB and no resting arrhythmias.  Stress EKG is non-diagnostic for ischemia as it a pharmacologic stress using Lexiscan.Stress symptoms included dyspnea. 2. The perfusion imaging study demonstrates a very small sized inferolateral and inferoapical subtle ischemia.  LVEF was normal without regional wall motion at 77%.  This represents a low risk study.  Carotid duplex 06/22/2020: Right Carotid: Velocities in the right ICA are consistent with a 40-59% stenosis. Hemodynamically  significant plaque >50% visualized in the CCA.    Left Carotid: The left ICA stent appears <50% stenosed at the distal segment.   Vertebrals: Bilateral vertebral arteries demonstrate antegrade flow. Subclavians: Normal flow hemodynamics were seen in bilateral subclavian arteries.  LABORATORY DATA: External Labs: Collected: 01/07/2020 performed at [facility] Creatinine 1.0 mg/dL. eGFR: 70 mL/min per 1.73 m Sodium 130, potassium 4.9, chloride 94, bicarb 23 Hemoglobin 14.3 g/dL and hematocrit 44% Lipid profile: Total cholesterol 132, triglycerides 52, HDL 63, LDL 59, non-HDL 69  FINAL MEDICATION LIST END OF ENCOUNTER: No orders of the defined types were placed in this encounter.   Medications Discontinued During This Encounter  Medication Reason   REFRESH CELLUVISC 1 % GEL Error     Current Outpatient Medications:    ALPRAZolam (XANAX) 0.25 MG tablet, Take 0.25 mg by mouth at bedtime as needed., Disp: , Rfl:    aspirin EC 81 MG tablet, Take  81 mg by mouth daily., Disp: , Rfl:    atorvastatin (LIPITOR) 40 MG tablet, Take 1 tablet (40 mg total) by mouth every evening., Disp: 30 tablet, Rfl: 1   Cholecalciferol (VITAMIN D PO), Take 1 capsule by mouth every evening., Disp: , Rfl:    clopidogrel (PLAVIX) 75 MG tablet, TAKE 1 TABLET DAILY (Patient taking differently: Take 75 mg by mouth every morning.), Disp: 90 tablet, Rfl: 3   finasteride (PROSCAR) 5 MG tablet, Take 1 tablet (5 mg total) by mouth daily., Disp: 90 tablet, Rfl: 3   hydrALAZINE (APRESOLINE) 25 MG tablet, Take 1 tablet (25 mg total) by mouth 3 (three) times daily., Disp: 270 tablet, Rfl: 3   olmesartan (BENICAR) 40 MG tablet, TAKE 1 TABLET DAILY, Disp: 90 tablet, Rfl: 3   pantoprazole (PROTONIX) 40 MG tablet, Take 40 mg by mouth daily., Disp: , Rfl:    polyethylene glycol (MIRALAX / GLYCOLAX) packet, Take 17 g by mouth every morning., Disp: , Rfl:    spironolactone (ALDACTONE) 25 MG tablet, Take 25 mg by  mouth daily., Disp: , Rfl:   IMPRESSION:    ICD-10-CM   1. Atherosclerosis of left carotid artery  I65.22 EKG 12-Lead    Carotid    2. Benign hypertension  I10 PCV ECHOCARDIOGRAM COMPLETE    3. Mixed hyperlipidemia  E78.2     4. Hx of TIA (transient ischemic attack) and stroke  Z86.73     5. Former smoker  Z87.891     45. Class 2 severe obesity due to excess calories with serious comorbidity and body mass index (BMI) of 38.0 to 38.9 in adult [facility])  E66.01    Z68.38        RECOMMENDATIONS: Henry [name] is a 86 y.o. male whose past medical history and cardiovascular risk factors include: Hyperlipidemia, hypertension, hyperglycemia, history of carotid artery disease status post LICA stenting, history of CVA in 2016, history of syncope, advanced age, obesity due to excess calories.  Atherosclerosis of the left carotid artery status post stenting:  Continue antiplatelet and statin therapy.   Repeat carotid duplex in April 2022. Patient is educated on looking up her symptoms of ipsilateral vision changes and contralateral focal neurological deficits.  If such symptoms arise they are educated to seek medical attention by going to the closest ER via EMS as these symptoms suggest possible stroke.  Both patient and wife verbalized understanding  Benign essential hypertension: Well controlled.  Medications reconciled.  Low-salt diet recommended.  Continue to monitor Echo prior to next year to re-evaluate LVEF and AS.   Mixed hyperlipidemia: Continue statin therapy. Will have lipids w/ PCP in Jan 2023 per wife. Recommended that a copy is forwarded to [place] for reference.   History of CVA: Currently on dual antiplatelet therapy.  Continue statin therapy.  Continue aggressive risk factor modifications.  Former smoker: Educated on the importance of continued smoking cessation.  His carotid duplex has remained stable and therefore, recommended annual follow up visit; however, patient's wife  would like to continue w/ 6 month visits.   Orders Placed This Encounter  Procedures   EKG 12-Lead   PCV ECHOCARDIOGRAM COMPLETE   Carotid    --Continue cardiac medications as reconciled in final medication list. --Return in about 7 months (around 07/13/2021) for Follow up carotid artery disease, carotid duplex prior to the visit.. Or sooner if needed. --Continue follow-up with your primary care physician regarding the management of your other chronic comorbid conditions.  Patient's questions and  concerns were addressed to his satisfaction. He voices understanding of the instructions provided during this encounter.   This note was created using a voice recognition software as a result there may be grammatical errors inadvertently enclosed that do not reflect the nature of this encounter. Every attempt is made to correct such errors.  Total time spent: 20 minutes  Mechele Claude [facility]  Pager: [phone] Office: [phone]

## 2020-12-26 ENCOUNTER — Encounter: Payer: Self-pay | Admitting: Cardiology

## 2020-12-29 ENCOUNTER — Ambulatory Visit (HOSPITAL_COMMUNITY)
Admission: RE | Admit: 2020-12-29 | Discharge: 2020-12-29 | Disposition: A | Discharge status: Home / Self Care | Payer: Medicare Other | Source: Ambulatory Visit | Attending: Cardiology | Admitting: Cardiology

## 2020-12-29 ENCOUNTER — Ambulatory Visit (HOSPITAL_COMMUNITY): Payer: Medicare Other

## 2020-12-29 DIAGNOSIS — I6522 Occlusion and stenosis of left carotid artery: Secondary | ICD-10-CM | POA: Diagnosis not present

## 2020-12-30 ENCOUNTER — Other Ambulatory Visit: Payer: Self-pay | Admitting: Cardiology

## 2020-12-30 DIAGNOSIS — I6522 Occlusion and stenosis of left carotid artery: Secondary | ICD-10-CM

## 2020-12-31 NOTE — Progress Notes (Signed)
Patient's wife called back spoke to Baxter Flattery they are aware of results

## 2020-12-31 NOTE — Progress Notes (Signed)
Tried calling patient no answer left vm

## 2021-01-04 ENCOUNTER — Telehealth (HOSPITAL_COMMUNITY): Payer: Self-pay

## 2021-01-04 NOTE — Telephone Encounter (Signed)
Pt agreed to f/u in 6 months with us carotid. AW 

## 2021-01-28 DIAGNOSIS — M9905 Segmental and somatic dysfunction of pelvic region: Secondary | ICD-10-CM | POA: Diagnosis not present

## 2021-01-28 DIAGNOSIS — M9903 Segmental and somatic dysfunction of lumbar region: Secondary | ICD-10-CM | POA: Diagnosis not present

## 2021-01-28 DIAGNOSIS — M546 Pain in thoracic spine: Secondary | ICD-10-CM | POA: Diagnosis not present

## 2021-01-28 DIAGNOSIS — M5136 Other intervertebral disc degeneration, lumbar region: Secondary | ICD-10-CM | POA: Diagnosis not present

## 2021-01-28 DIAGNOSIS — M9902 Segmental and somatic dysfunction of thoracic region: Secondary | ICD-10-CM | POA: Diagnosis not present

## 2021-02-14 DIAGNOSIS — R39198 Other difficulties with micturition: Secondary | ICD-10-CM | POA: Insufficient documentation

## 2021-02-14 NOTE — Progress Notes (Incomplete)
H&P  Chief Complaint: ***  History of Present Illness: Here for followup of BPS as well as a h/o gross hematuri.  Past Medical History:  Diagnosis Date   Accelerated hypertension 06/29/2014   Arthritis    Back   Chronic kidney disease    Dizziness and giddiness    Enlarged prostate    Esophagitis 05/10/2011   Foley catheter in place    FOOT SPRAIN, RIGHT 07/18/2007   Qualifier: Diagnosis of  By: Aline Brochure MD, [name]     GERD (gastroesophageal reflux disease)    HOH (hard of hearing)    Hx-TIA (transient ischemic attack)    hx of this- wife states 23 years ago-but Dr. Estil Daft told wife he had had several-has 2 stents in carotid artery-left   Hypercholesteremia    Hypertension    Low back pain    Nocturia    Shingles    Stroke [facility])    had a stroke 05/23/2014   Syncope and collapse     Past Surgical History:  Procedure Laterality Date   carotid stents  05/2014   2 stents in carotid artery on left side   COLONOSCOPY     COLONOSCOPY  05/17/2011   Procedure: COLONOSCOPY;  Surgeon: [name], MD;  [facility];  Service: Endoscopy;  Laterality: N/A;  830   epidural steroid injection  05/22/2014   had this in lumbar and at 1 am on 05/23/2014 had a stroke   ESOPHAGOGASTRODUODENOSCOPY  10/28/2010   Procedure: ESOPHAGOGASTRODUODENOSCOPY (EGD);  Surgeon: [name], MD;  [facility];  Service: Endoscopy;  Laterality: N/A;  10:30   ESOPHAGOGASTRODUODENOSCOPY N/A 04/24/2014   Procedure: ESOPHAGOGASTRODUODENOSCOPY (EGD);  Surgeon: [name], MD;  [facility];  Service: Endoscopy;  Laterality: N/A;  815   ESOPHAGOGASTRODUODENOSCOPY N/A 06/26/2014   Procedure: ESOPHAGOGASTRODUODENOSCOPY (EGD);  Surgeon: [name], MD;  [facility];  Service: Endoscopy;  Laterality: N/A;   EUS N/A 06/26/2014   Procedure: FULL UPPER ENDOSCOPIC ULTRASOUND (EUS) RADIAL;  Surgeon: [name], MD;  [facility];  Service: Endoscopy;   Laterality: N/A;   IR GENERIC HISTORICAL  10/12/2015   IR ANGIO VERTEBRAL SEL VERTEBRAL BILAT MOD SED 10/12/2015 Luanne Bras, MD [facility]   IR GENERIC HISTORICAL  10/12/2015   IR ANGIO INTRA EXTRACRAN SEL COM CAROTID INNOMINATE BILAT MOD SED 10/12/2015 Lu[facility] Bras, MD [facility]   RADIOLOGY WITH ANESTHESIA N/A 05/23/2014   Procedure: RADIOLOGY WITH ANESTHESIA;  Surgeon: Medication Radiologist, MD;  Location: [place];  Service: Radiology;  Laterality: N/A;   RESECTION DISTAL CLAVICAL Right 12/13/2012   Procedure: RESECTION DISTAL CLAVICAL;  Surgeon: Carole Civil, MD;  [facility];  Service: Orthopedics;  Laterality: Right;   SHOULDER ARTHROSCOPY WITH BICEPSTENOTOMY Right 12/13/2012   Procedure: SHOULDER ARTHROSCOPY WITH BICEPS TENOTOMY, limited shoulder debridement;  Surgeon: Carole Civil, MD;  [facility];  Service: Orthopedics;  Laterality: Right;   SHOULDER SURGERY Right    torn cartiledge   SHOULDER SURGERY     right shoulder   TRANSURETHRAL RESECTION OF PROSTATE N/A 07/09/2014   Procedure: TRANSURETHRAL RESECTION of prostate WITH BUTTON ;  Surgeon: [name], MD;  [facility];  Service: Urology;  Laterality: N/A;    Home Medications:  Allergies as of 02/17/2020   No Known Allergies      Medication List        Accurate as of February 17, 2020 11:59 PM. If you have any questions,  ask your nurse or doctor.          ALPRAZolam 0.25 MG tablet Commonly known as: XANAX Take 0.25 mg by mouth at bedtime as needed.   aspirin EC 81 MG tablet Take 81 mg by mouth daily.   atorvastatin 40 MG tablet Commonly known as: LIPITOR Take 1 tablet (40 mg total) by mouth every evening.   clopidogrel 75 MG tablet Commonly known as: PLAVIX TAKE 1 TABLET DAILY What changed: when to take this   finasteride 5 MG tablet Commonly known as: Proscar Take 1 tablet (5 mg total) by mouth daily. What changed: when to take this   hydrALAZINE 25 MG  tablet Commonly known as: APRESOLINE TAKE 1 TABLET THREE TIMES A DAY What changed: when to take this   olmesartan 40 MG tablet Commonly known as: BENICAR TAKE 1 TABLET DAILY   pantoprazole 40 MG tablet Commonly known as: PROTONIX Take 40 mg by mouth daily.   polyethylene glycol 17 g packet Commonly known as: MIRALAX / GLYCOLAX Take 17 g by mouth every morning.   Refresh Celluvisc 1 % Gel Generic drug: Carboxymethylcellulose Sod PF as needed.   spironolactone 25 MG tablet Commonly known as: ALDACTONE Take 25 mg by mouth daily.   VITAMIN D PO Take 1 capsule by mouth every evening.        Allergies: No Known Allergies  Family History  Problem Relation Age of Onset   Thyroid disease Mother    Colon cancer Neg Hx     Social History:  reports that he quit smoking about 37 years ago. His smoking use included cigarettes. He has a 17.50 pack-year smoking history. He has never used smokeless tobacco. He reports current alcohol use of about 3.0 standard drinks per week. He reports that he does not use drugs.  ROS: A complete review of systems was performed.  All systems are negative except for pertinent findings as noted.  Physical Exam:  Vital signs in last 24 hours: BP 130/73    Pulse 85    Temp 97.6 F (36.4 C)    Ht 5' 7.5" (1.715 m)    Wt 230 lb (104.3 kg)    BMI 35.49 kg/m  Constitutional:  Alert and oriented, No acute distress Cardiovascular: Regular rate  Respiratory: Normal respiratory effort GI: Abdomen is soft, nontender, nondistended, no abdominal masses. No CVAT.  Genitourinary: Normal male phallus, testes are descended bilaterally and non-tender and without masses, scrotum is normal in appearance without lesions or masses, perineum is normal on inspection. Lymphatic: No lymphadenopathy Neurologic: Grossly intact, no focal deficits Psychiatric: Normal mood and affect  I have reviewed prior pt notes  I have reviewed notes from referring/previous  physicians  I have reviewed urinalysis results  I have independently reviewed prior imaging  I have reviewed prior PSA results  I have reviewed prior urine culture   Impression/Assessment:  ***  Plan:  ***

## 2021-02-15 ENCOUNTER — Encounter: Payer: Self-pay | Admitting: Urology

## 2021-02-15 ENCOUNTER — Other Ambulatory Visit: Payer: Self-pay

## 2021-02-15 ENCOUNTER — Ambulatory Visit (INDEPENDENT_AMBULATORY_CARE_PROVIDER_SITE_OTHER): Payer: Medicare Other | Admitting: Urology

## 2021-02-15 VITALS — BP 129/80 | HR 75

## 2021-02-15 DIAGNOSIS — R39198 Other difficulties with micturition: Secondary | ICD-10-CM | POA: Diagnosis not present

## 2021-02-15 DIAGNOSIS — N401 Enlarged prostate with lower urinary tract symptoms: Secondary | ICD-10-CM | POA: Diagnosis not present

## 2021-02-15 LAB — URINALYSIS, ROUTINE W REFLEX MICROSCOPIC
Bilirubin, UA: NEGATIVE
Glucose, UA: NEGATIVE
Ketones, UA: NEGATIVE
Leukocytes,UA: NEGATIVE
Nitrite, UA: NEGATIVE
Protein,UA: NEGATIVE
RBC, UA: NEGATIVE
Specific Gravity, UA: 1.015 (ref 1.005–1.030)
Urobilinogen, Ur: 0.2 mg/dL (ref 0.2–1.0)
pH, UA: 6 (ref 5.0–7.5)

## 2021-02-15 LAB — BLADDER SCAN AMB NON-IMAGING

## 2021-02-15 MED ORDER — FINASTERIDE 5 MG PO TABS: 5.0000 mg | ORAL_TABLET | Freq: Every day | ORAL | 3 refills | Status: DC

## 2021-02-15 NOTE — Progress Notes (Signed)
History of Present Illness: This gentleman comes in today for follow-up of BPH.  He underwent TURP of the prostate by Dr. Jeffie Pollock in 2016.  He is here for annual visit.  He is on finasteride for history of BPH and gross hematuria.  No significant side effects.  IPSS 13, quality-of-life score 2.  He has had no gross hematuria.    Past Medical History:  Diagnosis Date   Accelerated hypertension 06/29/2014   Arthritis    Back   Chronic kidney disease    Dizziness and giddiness    Enlarged prostate    Esophagitis 05/10/2011   Foley catheter in place    FOOT SPRAIN, RIGHT 07/18/2007   Qualifier: Diagnosis of  By: Aline Brochure MD, [name]     GERD (gastroesophageal reflux disease)    HOH (hard of hearing)    Hx-TIA (transient ischemic attack)    hx of this- wife states 40 years ago-but Dr. Estil Daft told wife he had had several-has 2 stents in carotid artery-left   Hypercholesteremia    Hypertension    Low back pain    Nocturia    Shingles    Stroke [facility])    had a stroke 05/23/2014   Syncope and collapse     Past Surgical History:  Procedure Laterality Date   carotid stents  05/2014   2 stents in carotid artery on left side   COLONOSCOPY     COLONOSCOPY  05/17/2011   Procedure: COLONOSCOPY;  Surgeon: [name], MD;  Location: [facility];  Service: Endoscopy;  Laterality: N/A;  830   epidural steroid injection  05/22/2014   had this in lumbar and at 1 am on 05/23/2014 had a stroke   ESOPHAGOGASTRODUODENOSCOPY  10/28/2010   Procedure: ESOPHAGOGASTRODUODENOSCOPY (EGD);  Surgeon: [name], MD;  Location: [facility];  Service: Endoscopy;  Laterality: N/A;  10:30   ESOPHAGOGASTRODUODENOSCOPY N/A 04/24/2014   Procedure: ESOPHAGOGASTRODUODENOSCOPY (EGD);  Surgeon: [name], MD;  [facility]: [facility];  Service: Endoscopy;  Laterality: N/A;  815   ESOPHAGOGASTRODUODENOSCOPY N/A 06/26/2014   Procedure: ESOPHAGOGASTRODUODENOSCOPY (EGD);  Surgeon: [name], MD;  Location:  [facility];  Service: Endoscopy;  Laterality: N/A;   EUS N/A 06/26/2014   Procedure: FULL UPPER ENDOSCOPIC ULTRASOUND (EUS) RADIAL;  Surgeon: [name], MD;  Location: [facility];  Service: Endoscopy;  Laterality: N/A;   IR GENERIC HISTORICAL  10/12/2015   IR ANGIO VERTEBRAL SEL VERTEBRAL BILAT MOD SED 10/12/2015 Luanne Bras, MD [facility]   IR GENERIC HISTORICAL  10/12/2015   IR ANGIO INTRA EXTRACRAN SEL COM CAROTID INNOMINATE BILAT MOD SED 10/12/2015 Luanne Bras, MD [facility]   RADIOLOGY WITH ANESTHESIA N/A 05/23/2014   Procedure: RADIOLOGY WITH ANESTHESIA;  Surgeon: Medication Radiologist, MD;  Location: [place];  Service: Radiology;  Laterality: N/A;   RESECTION DISTAL CLAVICAL Right 12/13/2012   Procedure: RESECTION DISTAL CLAVICAL;  Surgeon: Carole Civil, MD;  Location: [facility];  Service: Orthopedics;  Laterality: Right;   SHOULDER ARTHROSCOPY WITH BICEPSTENOTOMY Right 12/13/2012   Procedure: SHOULDER ARTHROSCOPY WITH BICEPS TENOTOMY, limited shoulder debridement;  Surgeon: Carole Civil, MD;  Location: [facility];  Service: Orthopedics;  Laterality: Right;   SHOULDER SURGERY Right    torn cartiledge   SHOULDER SURGERY     right shoulder   TRANSURETHRAL RESECTION OF PROSTATE N/A 07/09/2014   Procedure: TRANSURETHRAL RESECTION of prostate WITH BUTTON ;  Surgeon: [name], MD;  Location: [facility];  Service: Urology;  Laterality: N/A;  Home Medications:  Allergies as of 02/15/2021   No Known Allergies      Medication List        Accurate as of February 15, 2021  9:37 AM. If you have any questions, ask your nurse or doctor.          ALPRAZolam 0.25 MG tablet Commonly known as: XANAX Take 0.25 mg by mouth at bedtime as needed.   aspirin EC 81 MG tablet Take 81 mg by mouth daily.   atorvastatin 40 MG tablet Commonly known as: LIPITOR Take 1 tablet (40 mg total) by mouth every evening.   clopidogrel 75 MG tablet Commonly known as: PLAVIX TAKE 1  TABLET DAILY What changed: when to take this   finasteride 5 MG tablet Commonly known as: Proscar Take 1 tablet (5 mg total) by mouth daily.   hydrALAZINE 25 MG tablet Commonly known as: APRESOLINE Take 1 tablet (25 mg total) by mouth 3 (three) times daily.   olmesartan 40 MG tablet Commonly known as: BENICAR TAKE 1 TABLET DAILY   pantoprazole 40 MG tablet Commonly known as: PROTONIX Take 40 mg by mouth daily.   polyethylene glycol 17 g packet Commonly known as: MIRALAX / GLYCOLAX Take 17 g by mouth every morning.   spironolactone 25 MG tablet Commonly known as: ALDACTONE Take 25 mg by mouth daily.   VITAMIN D PO Take 1 capsule by mouth every evening.        Allergies: No Known Allergies  Family History  Problem Relation Age of Onset   Thyroid disease Mother    Colon cancer Neg Hx     Social History:  reports that he quit smoking about 37 years ago. His smoking use included cigarettes. He has a 17.50 pack-year smoking history. He has never used smokeless tobacco. He reports current alcohol use of about 3.0 standard drinks per week. He reports that he does not use drugs.  ROS: A complete review of systems was performed.  All systems are negative except for pertinent findings as noted.  Physical Exam:  Vital signs in last 24 hours: BP 129/80    Pulse 75  Constitutional:  Alert and oriented, No acute distress Cardiovascular: Regular rate  Respiratory: Normal respiratory effort GI: Abdomen is soft, nontender, nondistended, no abdominal masses. No CVAT.  Obese. Genitourinary: Normal male phallus, testes are descended bilaterally and non-tender and without masses, scrotum is normal in appearance without lesions or masses, perineum is normal on inspection.  Normal anal sphincter tone.  Prostate 40 g, symmetric, nonnodular, nontender. Neurologic: Grossly intact, no focal deficits Psychiatric: Normal mood and affect  I have reviewed prior pt notes  I have reviewed  urinalysis results    Impression/Assessment:  1.  BPH, status post TURP about 6 years ago with adequate voiding symptoms and residual  2.  History of gross hematuria, on finasteride, no recurrence  Plan:  1.  Finasteride was refilled  2.  I will see back in 1 year with repeat DRE

## 2021-02-15 NOTE — Progress Notes (Signed)
Urological Symptom Review  Patient is experiencing the following symptoms: Frequent urination Hard to postpone urination Get up at night to urinate Trouble starting stream   Review of Systems  Gastrointestinal (upper)  : Indigestion/heartburn  Gastrointestinal (lower) : Constipation  Constitutional : Negative for symptoms  Skin: Negative for skin symptoms  Eyes: Negative for eye symptoms  Ear/Nose/Throat : Negative for Ear/Nose/Throat symptoms  Hematologic/Lymphatic: Easy bruising  Cardiovascular : Negative for cardiovascular symptoms  Respiratory : Negative for respiratory symptoms  Endocrine: Negative for endocrine symptoms  Musculoskeletal: Back pain  Neurological: Negative for neurological symptoms  Psychologic: Negative for psychiatric symptoms

## 2021-02-15 NOTE — Progress Notes (Signed)
post void residual 35ml  

## 2021-02-19 DIAGNOSIS — Z20822 Contact with and (suspected) exposure to covid-19: Secondary | ICD-10-CM | POA: Diagnosis not present

## 2021-02-24 DIAGNOSIS — E785 Hyperlipidemia, unspecified: Secondary | ICD-10-CM | POA: Diagnosis not present

## 2021-02-24 DIAGNOSIS — Z125 Encounter for screening for malignant neoplasm of prostate: Secondary | ICD-10-CM | POA: Diagnosis not present

## 2021-02-24 DIAGNOSIS — I10 Essential (primary) hypertension: Secondary | ICD-10-CM | POA: Diagnosis not present

## 2021-03-03 DIAGNOSIS — I10 Essential (primary) hypertension: Secondary | ICD-10-CM | POA: Diagnosis not present

## 2021-03-03 DIAGNOSIS — Z0000 Encounter for general adult medical examination without abnormal findings: Secondary | ICD-10-CM | POA: Diagnosis not present

## 2021-03-03 DIAGNOSIS — I130 Hypertensive heart and chronic kidney disease with heart failure and stage 1 through stage 4 chronic kidney disease, or unspecified chronic kidney disease: Secondary | ICD-10-CM | POA: Diagnosis not present

## 2021-03-03 DIAGNOSIS — R82998 Other abnormal findings in urine: Secondary | ICD-10-CM | POA: Diagnosis not present

## 2021-03-03 DIAGNOSIS — G4733 Obstructive sleep apnea (adult) (pediatric): Secondary | ICD-10-CM | POA: Diagnosis not present

## 2021-03-03 DIAGNOSIS — H9193 Unspecified hearing loss, bilateral: Secondary | ICD-10-CM | POA: Diagnosis not present

## 2021-03-03 DIAGNOSIS — R42 Dizziness and giddiness: Secondary | ICD-10-CM | POA: Diagnosis not present

## 2021-03-03 DIAGNOSIS — E785 Hyperlipidemia, unspecified: Secondary | ICD-10-CM | POA: Diagnosis not present

## 2021-03-03 DIAGNOSIS — I6930 Unspecified sequelae of cerebral infarction: Secondary | ICD-10-CM | POA: Diagnosis not present

## 2021-03-21 DIAGNOSIS — M546 Pain in thoracic spine: Secondary | ICD-10-CM | POA: Diagnosis not present

## 2021-03-21 DIAGNOSIS — M5136 Other intervertebral disc degeneration, lumbar region: Secondary | ICD-10-CM | POA: Diagnosis not present

## 2021-03-21 DIAGNOSIS — M9902 Segmental and somatic dysfunction of thoracic region: Secondary | ICD-10-CM | POA: Diagnosis not present

## 2021-03-21 DIAGNOSIS — M9905 Segmental and somatic dysfunction of pelvic region: Secondary | ICD-10-CM | POA: Diagnosis not present

## 2021-03-21 DIAGNOSIS — M9903 Segmental and somatic dysfunction of lumbar region: Secondary | ICD-10-CM | POA: Diagnosis not present

## 2021-03-30 DIAGNOSIS — B078 Other viral warts: Secondary | ICD-10-CM | POA: Diagnosis not present

## 2021-03-30 DIAGNOSIS — L570 Actinic keratosis: Secondary | ICD-10-CM | POA: Diagnosis not present

## 2021-03-30 DIAGNOSIS — L0202 Furuncle of face: Secondary | ICD-10-CM | POA: Diagnosis not present

## 2021-03-30 DIAGNOSIS — S60562A Insect bite (nonvenomous) of left hand, initial encounter: Secondary | ICD-10-CM | POA: Diagnosis not present

## 2021-03-30 DIAGNOSIS — X32XXXD Exposure to sunlight, subsequent encounter: Secondary | ICD-10-CM | POA: Diagnosis not present

## 2021-03-30 DIAGNOSIS — B9689 Other specified bacterial agents as the cause of diseases classified elsewhere: Secondary | ICD-10-CM | POA: Diagnosis not present

## 2021-05-04 DIAGNOSIS — Z20822 Contact with and (suspected) exposure to covid-19: Secondary | ICD-10-CM | POA: Diagnosis not present

## 2021-06-01 DIAGNOSIS — I10 Essential (primary) hypertension: Secondary | ICD-10-CM | POA: Diagnosis not present

## 2021-06-01 DIAGNOSIS — I952 Hypotension due to drugs: Secondary | ICD-10-CM | POA: Diagnosis not present

## 2021-06-02 DIAGNOSIS — Z20822 Contact with and (suspected) exposure to covid-19: Secondary | ICD-10-CM | POA: Diagnosis not present

## 2021-06-11 DIAGNOSIS — Z20822 Contact with and (suspected) exposure to covid-19: Secondary | ICD-10-CM | POA: Diagnosis not present

## 2021-06-15 DIAGNOSIS — M9905 Segmental and somatic dysfunction of pelvic region: Secondary | ICD-10-CM | POA: Diagnosis not present

## 2021-06-15 DIAGNOSIS — Z20822 Contact with and (suspected) exposure to covid-19: Secondary | ICD-10-CM | POA: Diagnosis not present

## 2021-06-15 DIAGNOSIS — M9902 Segmental and somatic dysfunction of thoracic region: Secondary | ICD-10-CM | POA: Diagnosis not present

## 2021-06-15 DIAGNOSIS — M5136 Other intervertebral disc degeneration, lumbar region: Secondary | ICD-10-CM | POA: Diagnosis not present

## 2021-06-15 DIAGNOSIS — M546 Pain in thoracic spine: Secondary | ICD-10-CM | POA: Diagnosis not present

## 2021-06-15 DIAGNOSIS — M9903 Segmental and somatic dysfunction of lumbar region: Secondary | ICD-10-CM | POA: Diagnosis not present

## 2021-06-16 ENCOUNTER — Emergency Department (HOSPITAL_COMMUNITY): Payer: Medicare Other

## 2021-06-16 ENCOUNTER — Encounter (HOSPITAL_COMMUNITY): Payer: Self-pay | Admitting: *Deleted

## 2021-06-16 ENCOUNTER — Emergency Department (HOSPITAL_COMMUNITY)
Admission: EM | Admit: 2021-06-16 | Discharge: 2021-06-16 | Disposition: A | Discharge status: Home / Self Care | Payer: Medicare Other | Attending: Emergency Medicine | Admitting: Emergency Medicine

## 2021-06-16 ENCOUNTER — Other Ambulatory Visit: Payer: Self-pay

## 2021-06-16 DIAGNOSIS — Z7982 Long term (current) use of aspirin: Secondary | ICD-10-CM | POA: Insufficient documentation

## 2021-06-16 DIAGNOSIS — S61210A Laceration without foreign body of right index finger without damage to nail, initial encounter: Secondary | ICD-10-CM | POA: Diagnosis not present

## 2021-06-16 DIAGNOSIS — R58 Hemorrhage, not elsewhere classified: Secondary | ICD-10-CM | POA: Diagnosis not present

## 2021-06-16 DIAGNOSIS — W07XXXA Fall from chair, initial encounter: Secondary | ICD-10-CM | POA: Diagnosis not present

## 2021-06-16 DIAGNOSIS — I10 Essential (primary) hypertension: Secondary | ICD-10-CM | POA: Insufficient documentation

## 2021-06-16 DIAGNOSIS — Z79899 Other long term (current) drug therapy: Secondary | ICD-10-CM | POA: Diagnosis not present

## 2021-06-16 DIAGNOSIS — S6991XA Unspecified injury of right wrist, hand and finger(s), initial encounter: Secondary | ICD-10-CM | POA: Diagnosis present

## 2021-06-16 DIAGNOSIS — Z23 Encounter for immunization: Secondary | ICD-10-CM | POA: Insufficient documentation

## 2021-06-16 DIAGNOSIS — I959 Hypotension, unspecified: Secondary | ICD-10-CM | POA: Diagnosis not present

## 2021-06-16 DIAGNOSIS — R52 Pain, unspecified: Secondary | ICD-10-CM | POA: Diagnosis not present

## 2021-06-16 MED ORDER — BUPIVACAINE HCL (PF) 0.5 % IJ SOLN
10.0000 mL | Freq: Once | INTRAMUSCULAR | Status: AC
  Administered 2021-06-16: 10 mL
  Filled 2021-06-16: qty 30

## 2021-06-16 MED ORDER — FENTANYL CITRATE PF 50 MCG/ML IJ SOSY
50.0000 ug | PREFILLED_SYRINGE | Freq: Once | INTRAMUSCULAR | Status: AC
  Administered 2021-06-16: 50 ug via INTRAVENOUS
  Filled 2021-06-16: qty 1

## 2021-06-16 MED ORDER — ACETAMINOPHEN 500 MG PO TABS
1000.0000 mg | ORAL_TABLET | Freq: Once | ORAL | Status: AC
  Administered 2021-06-16: 1000 mg via ORAL
  Filled 2021-06-16: qty 2

## 2021-06-16 MED ORDER — TETANUS-DIPHTH-ACELL PERTUSSIS 5-2.5-18.5 LF-MCG/0.5 IM SUSY
0.5000 mL | PREFILLED_SYRINGE | Freq: Once | INTRAMUSCULAR | Status: AC
  Administered 2021-06-16: 0.5 mL via INTRAMUSCULAR
  Filled 2021-06-16: qty 0.5

## 2021-06-16 NOTE — Discharge Instructions (Addendum)
Remove the bandage in 2 days.  After that clean the wound once a day with soap and water, rinse and dry well then apply a light bandage, until you have the stitches removed.  It is okay to get in the shower after the bandages removed in 2 days.  Return here or see your PCP as needed for problems. ?

## 2021-06-16 NOTE — ED Notes (Signed)
Pt given urinal.

## 2021-06-16 NOTE — ED Notes (Signed)
Xray at [facility] 

## 2021-06-16 NOTE — ED Notes (Signed)
R index 4.5cm spiral laceration b/w MCP and PIP. Flexion and extension intact. Tdap <5 years. Bleeding controlled with dressing.    ?

## 2021-06-16 NOTE — ED Notes (Signed)
Resting comfortably, alert, NAD, calm, interactive, bleeding controlled, family at [facility].  ?

## 2021-06-16 NOTE — ED Triage Notes (Signed)
Pt states he was sitting in a chair that started to fall over and he grabbed another metal chair causing his right index finger to get caught in chair; pt has bleeding to right finger on bandage ?

## 2021-06-16 NOTE — ED Provider Notes (Signed)
?[facility] ?Provider Note ? ? ?CSN: [id number] ?Arrival date & time: 06/16/21  1121 ? ?  ? ?History ? ?Chief Complaint  ?Patient presents with  ? Laceration  ? ? ?Henry [name] is a 87 y.o. male. ? ?HPI ?He injured his right index finger, as it was pinched, while he was falling out of another chair.  No other injuries ?  ? ?Home Medications ?Prior to Admission medications   ?Medication Sig Start Date End Date Taking? Authorizing Provider  ?ALPRAZolam (XANAX) 0.25 MG tablet Take 0.25 mg by mouth at bedtime as needed. 03/18/19   [provider]  ?aspirin EC 81 MG tablet Take 81 mg by mouth daily.    [provider]  ?atorvastatin (LIPITOR) 40 MG tablet Take 1 tablet (40 mg total) by mouth every evening. 06/05/14   Angiulli, Lavon Paganini, PA-C  ?Cholecalciferol (VITAMIN D PO) Take 1 capsule by mouth every evening.    [provider]  ?clopidogrel (PLAVIX) 75 MG tablet TAKE 1 TABLET DAILY ?Patient taking differently: Take 75 mg by mouth every morning. 05/14/15   Rosalin Hawking, MD  ?finasteride (PROSCAR) 5 MG tablet Take 1 tablet (5 mg total) by mouth daily. 02/15/21   Franchot Gallo, MD  ?hydrALAZINE (APRESOLINE) 25 MG tablet Take 1 tablet (25 mg total) by mouth 3 (three) times daily. 10/08/20   Tolia, Sunit, DO  ?olmesartan (BENICAR) 40 MG tablet TAKE 1 TABLET DAILY 12/09/20   Tolia, Sunit, DO  ?pantoprazole (PROTONIX) 40 MG tablet Take 40 mg by mouth daily.    [provider]  ?polyethylene glycol (MIRALAX / GLYCOLAX) packet Take 17 g by mouth every morning.    [provider]  ?spironolactone (ALDACTONE) 25 MG tablet Take 25 mg by mouth daily. 06/01/19   [provider]  ?   ? ?Allergies    ?Patient has no known allergies.   ? ?Review of Systems   ?Review of Systems ? ?Physical Exam ?Updated Vital Signs ?BP (!) 150/107 (BP Location: Right Arm)   Pulse 74   Temp 99 ?F (37.2 ?C) (Oral)   Resp 20   Ht '5\' 7"'$  (1.702 m)   Wt 95.3 kg   SpO2 100%   BMI  32.89 kg/m?  ?Physical Exam ?Vitals and nursing note reviewed.  ?Constitutional:   ?   General: He is in acute distress (Moderate pain).  ?   Appearance: He is well-developed. He is not ill-appearing.  ?HENT:  ?   Head: Normocephalic and atraumatic.  ?   Right Ear: External ear normal.  ?   Left Ear: External ear normal.  ?Eyes:  ?   Conjunctiva/sclera: Conjunctivae normal.  ?   Pupils: Pupils are equal, round, and reactive to light.  ?Neck:  ?   Trachea: Phonation normal.  ?Cardiovascular:  ?   Rate and Rhythm: Normal rate.  ?Pulmonary:  ?   Effort: Pulmonary effort is normal.  ?Abdominal:  ?   General: There is no distension.  ?Musculoskeletal:     ?   General: Normal range of motion.  ?   Cervical back: Normal range of motion and neck supple.  ?   Comments: Normal flexion right index finger.  ?Skin: ?   General: Skin is warm and dry.  ?   Comments: Right index finger with gaping distal flap laceration, volar, proximal third, mild oozing but no spurting of blood.  Neurovascularly intact distally in the right index finger  ?Neurological:  ?   Mental  Status: He is alert and oriented to person, place, and time.  ?   Cranial Nerves: No cranial nerve deficit.  ?   Sensory: No sensory deficit.  ?   Motor: No abnormal muscle tone.  ?   Coordination: Coordination normal.  ?Psychiatric:     ?   Mood and Affect: Mood normal.     ?   Behavior: Behavior normal.     ?   Thought Content: Thought content normal.     ?   Judgment: Judgment normal.  ? ? ?ED Results / Procedures / Treatments   ?Labs ?(all labs ordered are listed, but only abnormal results are displayed) ?Labs Reviewed - No data to display ? ?EKG ?None ? ?Radiology ?DG Hand 2 View Right ? ?Result Date: 06/16/2021 ?CLINICAL DATA:  Laceration right index finger EXAM: RIGHT HAND - 2 VIEW COMPARISON:  None Available. FINDINGS: No fracture or dislocation is seen. There is soft tissue deformity adjacent to the middle phalanx. No definite opaque foreign bodies seen.  IMPRESSION: No recent fracture or dislocation is seen. There are no radiopaque foreign bodies. Electronically Signed   By: Elmer Picker M.D.   On: 06/16/2021 12:15   ? ?Procedures ?Marland Kitchen.Laceration Repair ? ?Date/Time: 06/16/2021 2:29 PM ?Performed by: Daleen Bo, MD ?Authorized by: Daleen Bo, MD  ? ?Consent:  ?  Consent obtained:  Verbal ?  Risks discussed:  Infection and pain ?Universal protocol:  ?  Patient identity confirmed:  Verbally with patient ?Anesthesia:  ?  Anesthesia method:  Nerve block ?  Block anesthetic:  Bupivacaine 0.5% w/o epi ?  Block technique:  Digital block ?  Block injection procedure:  Anatomic landmarks identified, introduced needle, incremental injection and anatomic landmarks palpated ?  Block outcome:  Anesthesia achieved ?Laceration details:  ?  Location:  Finger ?  Finger location:  R index finger ?  Length (cm):  5.5 ?  Depth (mm):  6 ?Pre-procedure details:  ?  Preparation:  Imaging obtained to evaluate for foreign bodies ?Exploration:  ?  Limited defect created (wound extended): no   ?  Hemostasis achieved with:  Direct pressure ?  Wound exploration: wound explored through full range of motion and entire depth of wound visualized   ?  Wound extent: no fascia violation noted, no foreign bodies/material noted, no muscle damage noted, no nerve damage noted and no tendon damage noted   ?  Contaminated: no   ?Treatment:  ?  Area cleansed with:  Povidone-iodine ?  Amount of cleaning:  Standard ?  Irrigation solution:  Sterile saline ?  Irrigation method:  Syringe ?  Visualized foreign bodies/material removed: no   ?  Debridement:  Minimal ?  Undermining:  None ?Skin repair:  ?  Repair method:  Sutures ?  Suture size:  4-0 ?  Suture material:  Nylon ?  Suture technique:  Simple interrupted ?  Number of sutures:  6 ?Approximation:  ?  Approximation:  Loose ?Repair type:  ?  Repair type:  Simple ?Post-procedure details:  ?  Dressing: Xeroform, gauze, Coban; applied by me. ?   Procedure completion:  Tolerated well, no immediate complications  ? ? ?Medications Ordered in ED ?Medications  ?acetaminophen (TYLENOL) tablet 1,000 mg (1,000 mg Oral Given 06/16/21 1205)  ?fentaNYL (SUBLIMAZE) injection 50 mcg (50 mcg Intravenous Given 06/16/21 1205)  ?bupivacaine(PF) (MARCAINE) 0.5 % injection 10 mL (10 mLs Infiltration Given by Other 06/16/21 1248)  ?Tdap (BOOSTRIX) injection 0.5 mL (0.5 mLs Intramuscular Given 06/16/21 1447)  ? ? ?  ED Course/ Medical Decision Making/ A&P ?  ?                        ?Medical Decision Making ?Patient presenting for isolated injury to right index finger when it was accidentally pinched in a folding chair. ? ?Amount and/or Complexity of Data Reviewed ?Independent Historian:  ?   Details: He is a cogent historian ?Radiology: ordered and independent interpretation performed. ?   Details: Right index finger, no fracture or foreign body ? ?Risk ?OTC drugs. ?Prescription drug management. ?Decision regarding hospitalization. ?Risk Details: Patient presenting with isolated finger injury, laceration, requiring suture closure.  No deep injuries including tendon, nerve and artery.  Laceration repaired by me in the ED.  He does not require hospitalization or further ED interventions. ? ? ? ? ? ? ? ? ? ? ?Final Clinical Impression(s) / ED Diagnoses ?Final diagnoses:  ?Laceration of right index finger without foreign body without damage to nail, initial encounter  ? ? ?Rx / DC Orders ?ED Discharge Orders   ? ? None  ? ?  ? ? ?  ?Daleen Bo, MD ?06/16/21 1805 ? ?

## 2021-06-23 ENCOUNTER — Ambulatory Visit (HOSPITAL_COMMUNITY)
Admission: RE | Admit: 2021-06-23 | Discharge: 2021-06-23 | Disposition: A | Discharge status: Home / Self Care | Payer: Medicare Other | Source: Ambulatory Visit | Attending: Cardiology | Admitting: Cardiology

## 2021-06-23 ENCOUNTER — Ambulatory Visit: Payer: Medicare Other

## 2021-06-23 DIAGNOSIS — I10 Essential (primary) hypertension: Secondary | ICD-10-CM | POA: Diagnosis not present

## 2021-06-23 DIAGNOSIS — I6522 Occlusion and stenosis of left carotid artery: Secondary | ICD-10-CM | POA: Insufficient documentation

## 2021-06-23 NOTE — Progress Notes (Signed)
Carotid artery duplex completed. ?Refer to "CV Proc" under chart review to view preliminary results. ? ?06/23/2021 1:27 PM ?Kelby Aline., MHA, RVT, RDCS, RDMS   ?

## 2021-06-27 DIAGNOSIS — S61310A Laceration without foreign body of right index finger with damage to nail, initial encounter: Secondary | ICD-10-CM | POA: Diagnosis not present

## 2021-06-27 DIAGNOSIS — W07XXXA Fall from chair, initial encounter: Secondary | ICD-10-CM | POA: Diagnosis not present

## 2021-06-27 DIAGNOSIS — Z4802 Encounter for removal of sutures: Secondary | ICD-10-CM | POA: Diagnosis not present

## 2021-06-30 ENCOUNTER — Ambulatory Visit: Payer: Medicare Other | Admitting: Cardiology

## 2021-06-30 ENCOUNTER — Encounter: Payer: Self-pay | Admitting: Cardiology

## 2021-06-30 ENCOUNTER — Telehealth: Payer: Self-pay | Admitting: Cardiology

## 2021-06-30 VITALS — BP 144/77 | HR 67 | Temp 98.0°F | Resp 17 | Ht 67.0 in | Wt 241.0 lb

## 2021-06-30 DIAGNOSIS — Z87891 Personal history of nicotine dependence: Secondary | ICD-10-CM

## 2021-06-30 DIAGNOSIS — Z8673 Personal history of transient ischemic attack (TIA), and cerebral infarction without residual deficits: Secondary | ICD-10-CM | POA: Diagnosis not present

## 2021-06-30 DIAGNOSIS — I6523 Occlusion and stenosis of bilateral carotid arteries: Secondary | ICD-10-CM | POA: Diagnosis not present

## 2021-06-30 DIAGNOSIS — Z6837 Body mass index (BMI) 37.0-37.9, adult: Secondary | ICD-10-CM | POA: Diagnosis not present

## 2021-06-30 DIAGNOSIS — E782 Mixed hyperlipidemia: Secondary | ICD-10-CM | POA: Diagnosis not present

## 2021-06-30 DIAGNOSIS — I10 Essential (primary) hypertension: Secondary | ICD-10-CM

## 2021-06-30 NOTE — Progress Notes (Signed)
Henry [name] Date of Birth: 07-27-1934 MRN: [id number] Primary Care Provider:Paterson, Ermalene Searing, MD Former Cardiology Providers: Jeri Lager, APRN, FNP-C Primary Cardiologist: Rex Kras, DO, [facility] (established care 06/19/2019)  Date: 06/30/21 Last Office Visit: 12/24/2020  Chief Complaint  Patient presents with   Carotid Artery Disease   Follow-up    HPI  Henry [name] is a 87 y.o.  male whose past medical history and cardiovascular risk factors include: Hyperlipidemia, hypertension, hyperglycemia, history of carotid artery disease status post stenting, history of CVA in 2016, history of syncope, advanced age, obesity due to excess calories.  Patient is accompanied by his wife at today's office visit.    Patient and his wife present for 27-monthfollow-up visit.  He has undergone left ICA stenting in the past and had a repeat carotid duplex in May 2023.  Results reviewed with the patient and noted below for further reference.  Clinically he denies any changes in vision or focal neurological deficits.    Since last office visit he also had an echocardiogram results reviewed with the patient and his wife and noted below for further reference. He denies angina pectoris or heart failure symptoms.  His overall functional capacity remains relatively stable.  FUNCTIONAL STATUS: He walks 1.5 - 2 miles three times a week.    ALLERGIES: No Known Allergies   MEDICATION LIST PRIOR TO VISIT: Current Outpatient Medications on File Prior to Visit  Medication Sig Dispense Refill   ALPRAZolam (XANAX) 0.25 MG tablet Take 0.25 mg by mouth at bedtime as needed.     aspirin EC 81 MG tablet Take 81 mg by mouth daily.     atorvastatin (LIPITOR) 40 MG tablet Take 1 tablet (40 mg total) by mouth every evening. 30 tablet 1   Cholecalciferol (VITAMIN D PO) Take 1 capsule by mouth every evening.     clopidogrel (PLAVIX) 75 MG tablet TAKE 1 TABLET DAILY (Patient taking differently: Take 75 mg by  mouth every morning.) 90 tablet 3   finasteride (PROSCAR) 5 MG tablet Take 1 tablet (5 mg total) by mouth daily. 90 tablet 3   hydrALAZINE (APRESOLINE) 25 MG tablet Take 1 tablet (25 mg total) by mouth 3 (three) times daily. 270 tablet 3   olmesartan (BENICAR) 40 MG tablet TAKE 1 TABLET DAILY 90 tablet 3   pantoprazole (PROTONIX) 40 MG tablet Take 40 mg by mouth daily.     polyethylene glycol (MIRALAX / GLYCOLAX) packet Take 17 g by mouth every morning.     spironolactone (ALDACTONE) 25 MG tablet Take 25 mg by mouth daily.     No current facility-administered medications on file prior to visit.    PAST MEDICAL HISTORY: Past Medical History:  Diagnosis Date   Accelerated hypertension 06/29/2014   Arthritis    Back   Chronic kidney disease    Dizziness and giddiness    Enlarged prostate    Esophagitis 05/10/2011   Foley catheter in place    FOOT SPRAIN, RIGHT 07/18/2007   Qualifier: Diagnosis of  By: HAline BrochureMD, SDorothyann Peng    GERD (gastroesophageal reflux disease)    HOH (hard of hearing)    Hx-TIA (transient ischemic attack)    hx of this- wife states 172years ago-but Dr. DEstil Dafttold wife he had had several-has 2 stents in carotid artery-left   Hypercholesteremia    Hypertension    Low back pain    Nocturia    Shingles    [place] 05/23[place]2014   Syncope and collapse     PAST SURGICAL HISTORY: Past Surgical History:  Procedure Laterality Date   carotid stents  05/2014   2 stents in carotid artery on left side   COLONOSCOPY     COLONOSCOPY  05/17/2011   Procedure: COLONOSCOPY;  Surgeon: [name], MD;  [facility];  Service: Endoscopy;  Laterality: N/A;  830   epidural steroid injection  05/22/2014   had this in lumbar and at 1 am on 05/23/2014 had a stroke   ESOPHAGOGASTRODUODENOSCOPY  10/28/2010   Procedure: ESOPHAGOGASTRODUODENOSCOPY (EGD);  Surgeon: [name], MD;  [facility];  Service: Endoscopy;  Laterality: N/A;   10:30   ESOPHAGOGASTRODUODENOSCOPY N/A 04/24/2014   Procedure: ESOPHAGOGASTRODUODENOSCOPY (EGD);  Surgeon: [name], MD;  [facility];  Service: Endoscopy;  Laterality: N/A;  815   ESOPHAGOGASTRODUODENOSCOPY N/A 06/26/2014   Procedure: ESOPHAGOGASTRODUODENOSCOPY (EGD);  Surgeon: [name], MD;  [facility];  Service: Endoscopy;  Laterality: N/A;   EUS N/A 06/26/2014   Procedure: FULL UPPER ENDOSCOPIC ULTRASOUND (EUS) RADIAL;  Surgeon: [name], MD;  Location: [facility];  Service: Endoscopy;  Laterality: N/A;   IR GENERIC HISTORICAL  10/12/2015   IR ANGIO VERTEBRAL SEL VERTEBRAL BILAT MOD SED 10/12/2015 Luanne Bras, MD [facility]   IR GENERIC HISTORICAL  10/12/2015   IR ANGIO INTRA EXTRACRAN SEL COM CAROTID INNOMINATE BILAT MOD SED 10/12/2015 Luanne Bras, MD [facility]   RADIOLOGY WITH ANESTHESIA N/A 05/23/2014   Procedure: RADIOLOGY WITH ANESTHESIA;  Surgeon: Medication Radiologist, MD;  Location: [facility];  Service: Radiology;  Laterality: N/A;   RESECTION DISTAL CLAVICAL Right 12/13/2012   Procedure: RESECTION DISTAL CLAVICAL;  Surgeon: Carole Civil, MD;  [facility];  Service: Orthopedics;  Laterality: Right;   SHOULDER ARTHROSCOPY WITH BICEPSTENOTOMY Right 12/13/2012   Procedure: SHOULDER ARTHROSCOPY WITH BICEPS TENOTOMY, limited shoulder debridement;  Surgeon: Carole Civil, MD;  Location: [facility];  Service: Orthopedics;  Laterality: Right;   SHOULDER SURGERY Right    torn cartiledge   SHOULDER SURGERY     right shoulder   TRANSURETHRAL RESECTION OF PROSTATE N/A 07/09/2014   Procedure: TRANSURETHRAL RESECTION of prostate WITH BUTTON ;  Surgeon: [name], MD;  Location: [facility];  Service: Urology;  Laterality: N/A;    FAMILY HISTORY: The patient's family history includes Thyroid disease in his mother.   SOCIAL HISTORY:  The patient  reports that he quit smoking about 37 years ago. His smoking use included cigarettes. He has  a 17.50 pack-year smoking history. He has never used smokeless tobacco. He reports current alcohol use of about 3.0 standard drinks per week. He reports that he does not use drugs.  Review of Systems  Cardiovascular:  Negative for chest pain, dyspnea on exertion, leg swelling, near-syncope, orthopnea, palpitations, paroxysmal nocturnal dyspnea and syncope.  Respiratory:  Negative for shortness of breath.    PHYSICAL EXAM:    06/30/2021    9:53 AM 06/16/2021    2:50 PM 06/16/2021    2:00 PM  Vitals with BMI  Height 5' 7"     Weight 241 lbs    BMI 54.56    Systolic 256 389 373  Diastolic 77 428 52  Pulse 67 74     CONSTITUTIONAL: Well-developed and well-nourished. No acute distress.  SKIN: Skin is warm and dry. No rash noted. No cyanosis. No pallor. No jaundice HEAD: Normocephalic and atraumatic.  EYES: No scleral  icterus MOUTH/THROAT: Moist oral membranes.  NECK: No JVD present. No thyromegaly noted.  Right carotid bruit. CHEST Normal respiratory effort. No intercostal retractions  LUNGS: Clear to auscultation bilaterally.  No stridor. No wheezes. No rales.  CARDIOVASCULAR: Regular rate and rhythm, positive M3-N3, 3+ systolic ejection murmur heard second intercostal space, no rubs or gallops appreciated. ABDOMINAL: Obese, soft, nontender, nondistended, positive bowel sounds all 4 quadrants. No apparent ascites.  EXTREMITIES: Trace bilateral peripheral edema, warm to touch. HEMATOLOGIC: No significant bruising NEUROLOGIC: Oriented to person, place, and time. Nonfocal. Normal muscle tone.  PSYCHIATRIC: Normal mood and affect. Normal behavior. Cooperative  CARDIAC DATABASE: EKG: 06/30/2021: Normal sinus rhythm, 65 bpm, without underlying ischemia injury pattern.  Echocardiogram: 06/23/2021:  Poor echo window Normal LV systolic function with EF 58%. Left ventricle cavity is normal in size. Mild concentric remodeling of the left ventricle. Mild basal septal hypertrophy. Normal global  wall motion. Normal diastolic filling pattern, normal LAP. Calculated EF 58%. Left atrial cavity is mildly dilated at 4.5 cm. Mild calcification of the aortic valve annulus. No AV stenosis. Peak velocity 1.97 m/s, Peak Pressure Gradient 15.5 mmHg, Mean Gradient 8.1 mmHg, AVA 1.7 cm, Dimensionless Index >0.25. Elevated gradients could be due to mild basal septal hypertrophy and LVOT obstruction. Structurally normal mitral valve. Mild (Grade I) mitral regurgitation. Structurally normal pulmonic valve. Mild pulmonic regurgitation. No significant change since 12/11/2018 and 05/25/2016.   Event Monitor 30 days 02/19/2015: NSR, No symptoms reported. No A. Fib or heart block.   Lexiscan myoview stress test 02/26/2015: 1. The resting electrocardiogram demonstrated normal sinus rhythm, incomplete RBBB and no resting arrhythmias.  Stress EKG is non-diagnostic for ischemia as it a pharmacologic stress using Lexiscan.Stress symptoms included dyspnea. 2. The perfusion imaging study demonstrates a very small sized inferolateral and inferoapical subtle ischemia.  LVEF was normal without regional wall motion at 77%.  This represents a low risk study.  Carotid duplex 06/23/2021 Right Carotid: Velocities in the right ICA are consistent with a 40-59% stenosis. The ECA appears >50% stenosed.  Left Carotid: The ECA appears >50% stenosed. ICA stent shows <50% stenosis. Vertebrals: Bilateral vertebral arteries demonstrate antegrade flow. Subclavians: Normal flow hemodynamics were seen in bilateral subclavian arteries.  LABORATORY DATA: External Labs: Collected: 01/07/2020 performed at [facility] Creatinine 1.0 mg/dL. eGFR: 70 mL/min per 1.73 m Sodium 130, potassium 4.9, chloride 94, bicarb 23 Hemoglobin 14.3 g/dL and hematocrit 44% Lipid profile: Total cholesterol 132, triglycerides 52, HDL 63, LDL 59, non-HDL 69  FINAL MEDICATION LIST END OF ENCOUNTER: No orders of the defined types were  placed in this encounter.   There are no discontinued medications.    Current Outpatient Medications:    ALPRAZolam (XANAX) 0.25 MG tablet, Take 0.25 mg by mouth at bedtime as needed., Disp: , Rfl:    aspirin EC 81 MG tablet, Take 81 mg by mouth daily., Disp: , Rfl:    atorvastatin (LIPITOR) 40 MG tablet, Take 1 tablet (40 mg total) by mouth every evening., Disp: 30 tablet, Rfl: 1   Cholecalciferol (VITAMIN D PO), Take 1 capsule by mouth every evening., Disp: , Rfl:    clopidogrel (PLAVIX) 75 MG tablet, TAKE 1 TABLET DAILY (Patient taking differently: Take 75 mg by mouth every morning.), Disp: 90 tablet, Rfl: 3   finasteride (PROSCAR) 5 MG tablet, Take 1 tablet (5 mg total) by mouth daily., Disp: 90 tablet, Rfl: 3   hydrALAZINE (APRESOLINE) 25 MG tablet, Take 1 tablet (25 mg total) by mouth 3 (  three) times daily., Disp: 270 tablet, Rfl: 3   olmesartan (BENICAR) 40 MG tablet, TAKE 1 TABLET DAILY, Disp: 90 tablet, Rfl: 3   pantoprazole (PROTONIX) 40 MG tablet, Take 40 mg by mouth daily., Disp: , Rfl:    polyethylene glycol (MIRALAX / GLYCOLAX) packet, Take 17 g by mouth every morning., Disp: , Rfl:    spironolactone (ALDACTONE) 25 MG tablet, Take 25 mg by mouth daily., Disp: , Rfl:   IMPRESSION:    ICD-10-CM   1. Atherosclerosis of both carotid arteries  I65.23 EKG 12-Lead    VAS US CAROTID    CANCELED: US Carotid Bilateral    2. Hx of TIA (transient ischemic attack) and stroke  Z86.73 VAS US CAROTID    CANCELED: US Carotid Bilateral    3. Benign hypertension  I10     4. Mixed hyperlipidemia  E78.2     5. Former smoker  Z87.891     2. Class 2 severe obesity due to excess calories with serious comorbidity and body mass index (BMI) of 37.0 to 37.9 in adult [facility])  E66.01    Z68.37        RECOMMENDATIONS: Henry [name] is a 87 y.o. male whose past medical history and cardiovascular risk factors include: Hyperlipidemia, hypertension, hyperglycemia, history of carotid artery  disease status post LICA stenting, history of CVA in 2016, history of syncope, advanced age, obesity due to excess calories.  Atherosclerosis of the left carotid artery status post stenting:  Repeat carotid duplex in May 2024 Continue antiplatelet and statin therapy. Patient states that he recently had lipids checked by PCP.  He will call the office back to update our records. Patient is educated on being cognizant for any acute vision changes or focal neurological deficits and if present to go to the closest ER via EMS for further evaluation and management.  Benign essential hypertension: Office blood pressures are within acceptable range. Home blood pressures are better controlled. Medications reconciled. No changes made at today's office visit. Currently managed by primary care provider.  Mixed hyperlipidemia: Continue statin therapy.  Currently managed by primary care provider.  History of CVA: Currently on dual antiplatelet therapy.  Continue statin therapy.  Continue aggressive risk factor modifications.  Former smoker: Educated on the importance of continued smoking cessation.  As part of today's office visit discussed management of at least 2 chronic comorbid conditions, reviewed results of the echo and carotid duplex with the patient and his wife.  We will request records to update his labs for reference.  Their questions and concerns were addressed to their satisfaction we will see him back in 1 year or sooner if change in clinical status.  Orders Placed This Encounter  Procedures   EKG 12-Lead   VAS US CAROTID    --Continue cardiac medications as reconciled in final medication list. --Return in about 1 year (around 07/01/2022) for Follow up , Carotid disease, s/p duplex. . Or sooner if needed. --Continue follow-up with your primary care physician regarding the management of your other chronic comorbid conditions.  Patient's questions and concerns were addressed to his  satisfaction. He voices understanding of the instructions provided during this encounter.   This note was created using a voice recognition software as a result there may be grammatical errors inadvertently enclosed that do not reflect the nature of this encounter. Every attempt is made to correct such errors.   Rex Kras, [place], [facility]  Pager: [phone] Office: [phone]

## 2021-06-30 NOTE — Telephone Encounter (Signed)
Patient's wife says Dr. [name] Skains wanted them to report his cholesterol number on his meds. States it is 134 from last visit. Any questions please contact patient at number provided.

## 2021-07-01 NOTE — Telephone Encounter (Signed)
See message above

## 2021-07-04 DIAGNOSIS — L03011 Cellulitis of right finger: Secondary | ICD-10-CM | POA: Diagnosis not present

## 2021-07-04 DIAGNOSIS — S61210A Laceration without foreign body of right index finger without damage to nail, initial encounter: Secondary | ICD-10-CM | POA: Diagnosis not present

## 2021-07-04 DIAGNOSIS — I10 Essential (primary) hypertension: Secondary | ICD-10-CM | POA: Diagnosis not present

## 2021-07-04 DIAGNOSIS — S61310A Laceration without foreign body of right index finger with damage to nail, initial encounter: Secondary | ICD-10-CM | POA: Diagnosis not present

## 2021-07-06 NOTE — Telephone Encounter (Signed)
134? I need the entire cholesterol panel.   ST

## 2021-07-07 NOTE — Telephone Encounter (Signed)
Called patient's pcp to obtain labs waiting on a respond

## 2021-07-18 DIAGNOSIS — S61210A Laceration without foreign body of right index finger without damage to nail, initial encounter: Secondary | ICD-10-CM | POA: Diagnosis not present

## 2021-07-19 NOTE — Progress Notes (Signed)
External Labs: Collected: 02/24/2021 Total cholesterol 134, triglycerides 64, HDL 47, LDL 74, non-HDL 87 Apolipoprotein B 62 (within normal limits) BUN 13, creatinine 1. eGFR 71.2. Sodium 132, potassium 4.9, chloride 100, bicarb 25. AST 21, ALT 22, alkaline phosphatase 54  Luian Schumpert [place], DO, [facility]

## 2021-07-23 ENCOUNTER — Ambulatory Visit
Admission: EM | Admit: 2021-07-23 | Discharge: 2021-07-23 | Disposition: A | Discharge status: Home / Self Care | Payer: Medicare Other | Attending: Nurse Practitioner | Admitting: Nurse Practitioner

## 2021-07-23 ENCOUNTER — Encounter: Payer: Self-pay | Admitting: Emergency Medicine

## 2021-07-23 DIAGNOSIS — R0989 Other specified symptoms and signs involving the circulatory and respiratory systems: Secondary | ICD-10-CM

## 2021-07-23 MED ORDER — AZITHROMYCIN 250 MG PO TABS: 250.0000 mg | ORAL_TABLET | Freq: Every day | ORAL | 0 refills | Status: DC

## 2021-07-23 MED ORDER — FLUTICASONE PROPIONATE 50 MCG/ACT NA SUSP: 2.0000 | Freq: Every day | NASAL | 0 refills | Status: DC

## 2021-07-23 MED ORDER — BENZONATATE 100 MG PO CAPS: 100.0000 mg | ORAL_CAPSULE | Freq: Three times a day (TID) | ORAL | 0 refills | Status: DC | PRN

## 2021-07-23 NOTE — Discharge Instructions (Addendum)
Take medication as prescribed. Increase fluids and allow for plenty of rest. Recommend Tylenol as needed for pain, fever, or general discomfort. Warm salt water gargles 3-4 times daily to help with throat pain or discomfort. Recommend using a humidifier at bedtime during sleep to help with cough and nasal congestion. Purchase over-the-counter Coricidin HBP to help with your cough. Sleep elevated on 2 pillows. Follow-up with your primary care physician if your symptoms do not improve.

## 2021-07-23 NOTE — ED Triage Notes (Signed)
Sore throat, cough and nasal congestion x 3 days.

## 2021-07-23 NOTE — ED Provider Notes (Signed)
[facility]    CSN: [id number] Arrival date & time: 07/23/21  1058      History   Chief Complaint No chief complaint on file.   HPI Henry [name] is a 87 y.o. male.   HPI  Patient presents for complaints of sore throat, cough, nasal congestion for the past 3 days.  He denies fever, chills, ear pain, wheezing, shortness of breath, or difficulty breathing.  Reports that his throat pain is worse at night and worse in the morning, improves throughout the day.  Patient states that he attended a graduation prior to his symptoms starting.  He has received 3 COVID vaccines.  He has been using cough drops for his symptoms.    Past Medical History:  Diagnosis Date   Accelerated hypertension 06/29/2014   Arthritis    Back   Chronic kidney disease    Dizziness and giddiness    Enlarged prostate    Esophagitis 05/10/2011   Foley catheter in place    FOOT SPRAIN, RIGHT 07/18/2007   Qualifier: Diagnosis of  By: Aline Brochure MD, [name]     GERD (gastroesophageal reflux disease)    HOH (hard of hearing)    Hx-TIA (transient ischemic attack)    hx of this- wife states 46 years ago-but Dr. Estil Daft told wife he had had several-has 2 stents in carotid artery-left   Hypercholesteremia    Hypertension    Low back pain    Nocturia    Shingles    Stroke [facility])    had a stroke 05/23/2014   Syncope and collapse     Patient Active Problem List   Diagnosis Date Noted   Slowing, urinary stream 02/14/2021   Syncope 11/02/2017   Syncope and collapse 03/08/2015   Orthostatic hypotension 03/08/2015   Accelerated hypertension 08/28/2014   BPH (benign prostatic hypertrophy) with urinary obstruction 07/09/2014   Right leg swelling 07/06/2014   UTI (urinary tract infection) 07/06/2014   ARF (acute renal failure) as a complication of the indwelling Foley 07/05/2014   Cerebral infarction due to embolism of left middle cerebral artery ([place]) 06/29/2014   Carotid stenosis 06/29/2014    Urinary retention 06/29/2014   Hematuria 06/29/2014   Aphasia S/P CVA 05/28/2014   Dysphagia, pharyngoesophageal phase 05/28/2014   Left middle cerebral artery stroke ([place]) 05/26/2014   Coarctation of aorta, recurrent, post-intervention    Expressive aphasia    Acute right-sided weakness    Stroke ([place]) 05/23/2014   S/P shoulder surgery 12/24/2012   S/P arthroscopy of shoulder 12/16/2012   Shoulder arthritis 12/13/2012   Biceps tendon rupture, proximal 12/13/2012   Partial tear of right subscapularis tendon 12/13/2012   Hypertension 05/10/2011   Hyperlipidemia 05/10/2011   Barrett esophagus 05/10/2011    Past Surgical History:  Procedure Laterality Date   carotid stents  05/2014   2 stents in carotid artery on left side   COLONOSCOPY     COLONOSCOPY  05/17/2011   Procedure: COLONOSCOPY;  Surgeon: [name], MD;  [facility];  Service: Endoscopy;  Laterality: N/A;  830   epidural steroid injection  05/22/2014   had this in lumbar and at 1 am on 05/23/2014 had a stroke   ESOPHAGOGASTRODUODENOSCOPY  10/28/2010   Procedure: ESOPHAGOGASTRODUODENOSCOPY (EGD);  Surgeon: [name], MD;  [facility];  Service: Endoscopy;  Laterality: N/A;  10:30   ESOPHAGOGASTRODUODENOSCOPY N/A 04/24/2014   Procedure: ESOPHAGOGASTRODUODENOSCOPY (EGD);  Surgeon: [name], MD;  [facility];  Service: Endoscopy;  Laterality: N/A;  815   ESOPHAGOGASTRODUODENOSCOPY N/A 06/26/2014   Procedure: ESOPHAGOGASTRODUODENOSCOPY (EGD);  Surgeon: [name], MD;  Location: [facility];  Service: Endoscopy;  Laterality: N/A;   EUS N/A 06/26/2014   Procedure: FULL UPPER ENDOSCOPIC ULTRASOUND (EUS) RADIAL;  Surgeon: [name], MD;  Location: [facility];  Service: Endoscopy;  Laterality: N/A;   IR GENERIC HISTORICAL  10/12/2015   IR ANGIO VERTEBRAL SEL VERTEBRAL BILAT MOD SED 10/12/2015 Luanne Bras, MD [facility]   IR GENERIC HISTORICAL  10/12/2015   IR ANGIO  INTRA EXTRACRAN SEL COM CAROTID INNOMINATE BILAT MOD SED 10/12/2015 Luanne Bras, MD [facility]   RADIOLOGY WITH ANESTHESIA N/A 05/23/2014   Procedure: RADIOLOGY WITH ANESTHESIA;  Surgeon: Medication Radiologist, MD;  Location: [place];  Service: Radiology;  Laterality: N/A;   RESECTION DISTAL CLAVICAL Right 12/13/2012   Procedure: RESECTION DISTAL CLAVICAL;  Surgeon: Carole Civil, MD;  Location: [facility];  Service: Orthopedics;  Laterality: Right;   SHOULDER ARTHROSCOPY WITH BICEPSTENOTOMY Right 12/13/2012   Procedure: SHOULDER ARTHROSCOPY WITH BICEPS TENOTOMY, limited shoulder debridement;  Surgeon: Carole Civil, MD;  Location: [facility];  Service: Orthopedics;  Laterality: Right;   SHOULDER SURGERY Right    torn cartiledge   SHOULDER SURGERY     right shoulder   TRANSURETHRAL RESECTION OF PROSTATE N/A 07/09/2014   Procedure: TRANSURETHRAL RESECTION of prostate WITH BUTTON ;  Surgeon: [name], MD;  Location: [facility];  Service: Urology;  Laterality: N/A;       Home Medications    Prior to Admission medications   Medication Sig Start Date End Date Taking? Authorizing Provider  azithromycin (ZITHROMAX) 250 MG tablet Take 1 tablet (250 mg total) by mouth daily. Take first 2 tablets together, then 1 every day until finished. 07/23/21  Yes Arlisa Leclere-Warren, Alda Lea, NP  benzonatate (TESSALON PERLES) 100 MG capsule Take 1 capsule (100 mg total) by mouth every 8 (eight) hours as needed for cough. 07/23/21  Yes Parry Po-Warren, Alda Lea, NP  fluticasone (FLONASE) 50 MCG/ACT nasal spray Place 2 sprays into both nostrils daily. 07/23/21  Yes Noma Quijas-Warren, Alda Lea, NP  ALPRAZolam Duanne Moron) 0.25 MG tablet Take 0.25 mg by mouth at bedtime as needed. 03/18/19   [provider]  aspirin EC 81 MG tablet Take 81 mg by mouth daily.    [provider]  atorvastatin (LIPITOR) 40 MG tablet Take 1 tablet (40 mg total) by mouth every evening. 06/05/14   Angiulli, Lavon Paganini, PA-C   Cholecalciferol (VITAMIN D PO) Take 1 capsule by mouth every evening.    [provider]  clopidogrel (PLAVIX) 75 MG tablet TAKE 1 TABLET DAILY Patient taking differently: Take 75 mg by mouth every morning. 05/14/15   Rosalin Hawking, MD  finasteride (PROSCAR) 5 MG tablet Take 1 tablet (5 mg total) by mouth daily. 02/15/21   Franchot Gallo, MD  hydrALAZINE (APRESOLINE) 25 MG tablet Take 1 tablet (25 mg total) by mouth 3 (three) times daily. 10/08/20   Tolia, Sunit, DO  olmesartan (BENICAR) 40 MG tablet TAKE 1 TABLET DAILY 12/09/20   Tolia, Sunit, DO  pantoprazole (PROTONIX) 40 MG tablet Take 40 mg by mouth daily.    [provider]  polyethylene glycol (MIRALAX / GLYCOLAX) packet Take 17 g by mouth every morning.    [provider]  spironolactone (ALDACTONE) 25 MG tablet Take 25 mg by mouth daily. 06/01/19   [provider]    Family History Family History  Problem Relation  Age of Onset   Thyroid disease Mother    Colon cancer Neg Hx     Social History Social History   Tobacco Use   Smoking status: Former    Packs/day: 0.50    Years: 35.00    Total pack years: 17.50    Types: Cigarettes    Quit date: 1986    Years since quitting: 37.4   Smokeless tobacco: Never  Vaping Use   Vaping Use: Never used  Substance Use Topics   Alcohol use: Yes    Alcohol/week: 3.0 standard drinks of alcohol    Types: 3 Glasses of wine per week    Comment: 1 glass of wine every day   Drug use: No     Allergies   Patient has no known allergies.   Review of Systems Review of Systems Per HPI  Physical Exam Triage Vital Signs ED Triage Vitals [07/23/21 1135]  Enc Vitals Group     BP (!) 154/80     Pulse Rate 70     Resp 18     Temp 98.1 F (36.7 C)     Temp Source Oral     SpO2 95 %     Weight      Height      Head Circumference      Peak Flow      Pain Score 9     Pain Loc      Pain Edu?      Excl. in [place]?    No data found.  Updated Vital  Signs BP (!) 154/80 (BP Location: Right Arm)   Pulse 70   Temp 98.1 F (36.7 C) (Oral)   Resp 18   SpO2 95%   Visual Acuity Right Eye Distance:   Left Eye Distance:   Bilateral Distance:    Right Eye Near:   Left Eye Near:    Bilateral Near:     Physical Exam Vitals and nursing note reviewed.  Constitutional:      General: He is not in acute distress.    Appearance: He is well-developed.  HENT:     Head: Normocephalic and atraumatic.     Right Ear: Tympanic membrane, ear canal and external ear normal.     Left Ear: Tympanic membrane, ear canal and external ear normal.     Nose: Congestion present.     Right Turbinates: Enlarged and swollen.     Left Turbinates: Enlarged and swollen.     Right Sinus: No maxillary sinus tenderness or frontal sinus tenderness.     Left Sinus: No maxillary sinus tenderness or frontal sinus tenderness.     Mouth/Throat:     Pharynx: Posterior oropharyngeal erythema present. No oropharyngeal exudate.  Eyes:     Conjunctiva/sclera: Conjunctivae normal.  Cardiovascular:     Rate and Rhythm: Normal rate and regular rhythm.     Heart sounds: No murmur heard. Pulmonary:     Effort: Pulmonary effort is normal. No respiratory distress.     Breath sounds: Normal breath sounds.  Abdominal:     Palpations: Abdomen is soft.     Tenderness: There is no abdominal tenderness.  Musculoskeletal:        General: No swelling.     Cervical back: Neck supple.  Skin:    General: Skin is warm and dry.     Capillary Refill: Capillary refill takes less than 2 seconds.  Neurological:     General: No focal deficit present.  Mental Status: He is alert and oriented to person, place, and time.  Psychiatric:        Mood and Affect: Mood normal.        Behavior: Behavior normal.      UC Treatments / Results  Labs (all labs ordered are listed, but only abnormal results are displayed) Labs Reviewed  COVID-19, FLU A+B NAA    EKG   Radiology No  results found.  Procedures Procedures (including critical care time)  Medications Ordered in UC Medications - No data to display  Initial Impression / Assessment and Plan / UC Course  I have reviewed the triage vital signs and the nursing notes.  Pertinent labs & imaging results that were available during my care of the patient were reviewed by me and considered in my medical decision making (see chart for details).  Patient presents for complaints of upper respiratory symptoms for the past 3 days.  He reports that he recently attended a graduation.  COVID/flu test pending at this time.  We will treat patient prophylactically with azithromycin given his age and history.  Patient was advised that if medication does not work, symptoms are most likely of viral etiology.  We will also provide symptomatic treatment for his cough with Tessalon Perles.  Patient was provided supportive care recommendations.  Patient advised to follow-up with his primary care if symptoms do not improve. Final Clinical Impressions(s) / UC Diagnoses   Final diagnoses:  Symptoms of upper respiratory infection (URI)     Discharge Instructions      Take medication as prescribed. Increase fluids and allow for plenty of rest. Recommend Tylenol as needed for pain, fever, or general discomfort. Warm salt water gargles 3-4 times daily to help with throat pain or discomfort. Recommend using a humidifier at bedtime during sleep to help with cough and nasal congestion. Purchase over-the-counter Coricidin HBP to help with your cough. Sleep elevated on 2 pillows. Follow-up with your primary care physician if your symptoms do not improve.     ED Prescriptions     Medication Sig Dispense Auth. Provider   azithromycin (ZITHROMAX) 250 MG tablet Take 1 tablet (250 mg total) by mouth daily. Take first 2 tablets together, then 1 every day until finished. 6 tablet Anique Beckley-Warren, Alda Lea, NP   benzonatate (TESSALON PERLES)  100 MG capsule Take 1 capsule (100 mg total) by mouth every 8 (eight) hours as needed for cough. 30 capsule Deshonda Cryderman-Warren, Alda Lea, NP   fluticasone (FLONASE) 50 MCG/ACT nasal spray Place 2 sprays into both nostrils daily. 16 g Kaleab Frasier-Warren, Alda Lea, NP      PDMP not reviewed this encounter.   Tish Men, NP 07/23/21 1218

## 2021-07-24 LAB — COVID-19, FLU A+B NAA
Influenza A, NAA: NOT DETECTED
Influenza B, NAA: NOT DETECTED
SARS-CoV-2, NAA: NOT DETECTED

## 2021-07-25 ENCOUNTER — Telehealth (HOSPITAL_COMMUNITY): Payer: Self-pay

## 2021-07-25 DIAGNOSIS — M9902 Segmental and somatic dysfunction of thoracic region: Secondary | ICD-10-CM | POA: Diagnosis not present

## 2021-07-25 DIAGNOSIS — M5136 Other intervertebral disc degeneration, lumbar region: Secondary | ICD-10-CM | POA: Diagnosis not present

## 2021-07-25 DIAGNOSIS — M9905 Segmental and somatic dysfunction of pelvic region: Secondary | ICD-10-CM | POA: Diagnosis not present

## 2021-07-25 DIAGNOSIS — M546 Pain in thoracic spine: Secondary | ICD-10-CM | POA: Diagnosis not present

## 2021-07-25 DIAGNOSIS — M9903 Segmental and somatic dysfunction of lumbar region: Secondary | ICD-10-CM | POA: Diagnosis not present

## 2021-07-25 NOTE — Telephone Encounter (Signed)
Informed pt that ultrasound was stable per Dr. Estanislado Pandy and he will continue to review. AW

## 2021-07-25 NOTE — Telephone Encounter (Signed)
-----   Message from Tera Mater, PA-C sent at 07/25/2021  2:20 PM EDT ----- Regarding: RE: follow-up Hi Ash,   It looks like his cardiologist has been ordering US carotid, we will continue to review the [place].  Dr. Estanislado Pandy reviewed the recent US, it looks stable.   Thank you,  Aimee  ----- Message ----- From: Danielle Dess Sent: 07/25/2021   1:17 PM EDT To: Tera Mater, PA-C Subject: follow-up                                      Aimee,   Mr. Leverette is due for a f/u ultrasound. He had it done in May. Can you please have Dev review?  Thanks,  Lia Foyer

## 2021-07-28 ENCOUNTER — Ambulatory Visit
Admission: EM | Admit: 2021-07-28 | Discharge: 2021-07-28 | Disposition: A | Discharge status: Home / Self Care | Payer: Medicare Other | Attending: Nurse Practitioner | Admitting: Nurse Practitioner

## 2021-07-28 ENCOUNTER — Encounter: Payer: Self-pay | Admitting: Emergency Medicine

## 2021-07-28 DIAGNOSIS — R21 Rash and other nonspecific skin eruption: Secondary | ICD-10-CM

## 2021-07-28 MED ORDER — DEXAMETHASONE SODIUM PHOSPHATE 10 MG/ML IJ SOLN
10.0000 mg | INTRAMUSCULAR | Status: AC
  Administered 2021-07-28: 10 mg via INTRAMUSCULAR

## 2021-07-28 MED ORDER — TRIAMCINOLONE ACETONIDE 0.1 % EX CREA: 1.0000 "application " | TOPICAL_CREAM | Freq: Two times a day (BID) | CUTANEOUS | 0 refills | Status: DC

## 2021-07-28 MED ORDER — PREDNISONE 20 MG PO TABS: 40.0000 mg | ORAL_TABLET | Freq: Every day | ORAL | 0 refills | Status: DC

## 2021-07-28 NOTE — Discharge Instructions (Signed)
ToTake medication as prescribed. May also take over-the-counter Zyrtec to help with itching. Avoid hot baths or showers while symptoms persist.  Recommend taking lukewarm baths. May apply cool cloths to the area to help with itching or discomfort. Avoid scratching, rubbing, or manipulating the areas while symptoms persist. Recommend Aveeno colloidal oatmeal bath to use to help with drying and itching. Follow up if symptoms do not improve.

## 2021-07-28 NOTE — ED Provider Notes (Signed)
[facility]    CSN: [id number] Arrival date & time: 07/28/21  0813      History   Chief Complaint No chief complaint on file.   HPI Henry [name] is a 87 y.o. male.   The history is provided by the patient and the spouse.   Patient presents for rash to the left abdomen and flank into his left arm that started 1 day ago.  Patient describes the rash as itchy and irritated.  He denies any pain, drainage, fever, chills, abdominal pain, chest pain, new contacts with medications, soaps, detergents, or lotions.  Patient states he was treated with medications for an upper respiratory infection, but states that the symptoms started after he finished the medication.  Patient's wife informs that patient has had his shingles vaccine.  She states that they do have a indoor cat but does not go outside and a grand dog that goes in and out of the home.  He has not used any medication for his symptoms.  Past Medical History:  Diagnosis Date   Accelerated hypertension 06/29/2014   Arthritis    Back   Chronic kidney disease    Dizziness and giddiness    Enlarged prostate    Esophagitis 05/10/2011   Foley catheter in place    FOOT SPRAIN, RIGHT 07/18/2007   Qualifier: Diagnosis of  By: Aline Brochure MD, [name]     GERD (gastroesophageal reflux disease)    HOH (hard of hearing)    Hx-TIA (transient ischemic attack)    hx of this- wife states 55 years ago-but Dr. Estil Daft told wife he had had several-has 2 stents in carotid artery-left   Hypercholesteremia    Hypertension    Low back pain    Nocturia    Shingles    Stroke [facility])    had a stroke 05/23/2014   Syncope and collapse     Patient Active Problem List   Diagnosis Date Noted   Slowing, urinary stream 02/14/2021   Syncope 11/02/2017   Syncope and collapse 03/08/2015   Orthostatic hypotension 03/08/2015   Accelerated hypertension 08/28/2014   BPH (benign prostatic hypertrophy) with urinary obstruction 07/09/2014   Right  leg swelling 07/06/2014   UTI (urinary tract infection) 07/06/2014   ARF (acute renal failure) as a complication of the indwelling Foley 07/05/2014   Cerebral infarction due to embolism of left middle cerebral artery ([place]) 06/29/2014   Carotid stenosis 06/29/2014   Urinary retention 06/29/2014   Hematuria 06/29/2014   Aphasia S/P CVA 05/28/2014   Dysphagia, pharyngoesophageal phase 05/28/2014   Left middle cerebral artery stroke ([place]) 05/26/2014   Coarctation of aorta, recurrent, post-intervention    Expressive aphasia    Acute right-sided weakness    Stroke ([place]) 05/23/2014   S/P shoulder surgery 12/24/2012   S/P arthroscopy of shoulder 12/16/2012   Shoulder arthritis 12/13/2012   Biceps tendon rupture, proximal 12/13/2012   Partial tear of right subscapularis tendon 12/13/2012   Hypertension 05/10/2011   Hyperlipidemia 05/10/2011   Barrett esophagus 05/10/2011    Past Surgical History:  Procedure Laterality Date   carotid stents  05/2014   2 stents in carotid artery on left side   COLONOSCOPY     COLONOSCOPY  05/17/2011   Procedure: COLONOSCOPY;  Surgeon: [name], MD;  [facility];  Service: Endoscopy;  Laterality: N/A;  830   epidural steroid injection  05/22/2014   had this in lumbar and at 1 am on 05/23/2014 had a stroke  ESOPHAGOGASTRODUODENOSCOPY  10/28/2010   Procedure: ESOPHAGOGASTRODUODENOSCOPY (EGD);  Surgeon: [name], MD;  [facility];  Service: Endoscopy;  Laterality: N/A;  10:30   ESOPHAGOGASTRODUODENOSCOPY N/A 04/24/2014   Procedure: ESOPHAGOGASTRODUODENOSCOPY (EGD);  Surgeon: [name], MD;  [facility];  Service: Endoscopy;  Laterality: N/A;  815   ESOPHAGOGASTRODUODENOSCOPY N/A 06/26/2014   Procedure: ESOPHAGOGASTRODUODENOSCOPY (EGD);  Surgeon: [name], MD;  [facility];  Service: Endoscopy;  Laterality: N/A;   EUS N/A 06/26/2014   Procedure: FULL UPPER ENDOSCOPIC ULTRASOUND (EUS) RADIAL;   Surgeon: [name], MD;  [facility];  Service: Endoscopy;  Laterality: N/A;   IR GENERIC HISTORICAL  10/12/2015   IR ANGIO VERTEBRAL SEL VERTEBRAL BILAT MOD SED 10/12/2015 Luanne Bras, MD [facility]   IR GENERIC HISTORICAL  10/12/2015   IR ANGIO INTRA EXTRACRAN SEL COM CAROTID INNOMINATE BILAT MOD SED 10/12/2015 Luanne Bras, MD [facility]   RADIOLOGY WITH ANESTHESIA N/A 05/23/2014   Procedure: RADIOLOGY WITH ANESTHESIA;  Surgeon: Medication Radiologist, MD;  Location: [place];  Service: Radiology;  Laterality: N/A;   RESECTION DISTAL CLAVICAL Right 12/13/2012   Procedure: RESECTION DISTAL CLAVICAL;  Surgeon: Carole Civil, MD;  [facility];  Service: Orthopedics;  Laterality: Right;   SHOULDER ARTHROSCOPY WITH BICEPSTENOTOMY Right 12/13/2012   Procedure: SHOULDER ARTHROSCOPY WITH BICEPS TENOTOMY, limited shoulder debridement;  Surgeon: Carole Civil, MD;  [facility];  Service: Orthopedics;  Laterality: Right;   SHOULDER SURGERY Right    torn cartiledge   SHOULDER SURGERY     right shoulder   TRANSURETHRAL RESECTION OF PROSTATE N/A 07/09/2014   Procedure: TRANSURETHRAL RESECTION of prostate WITH BUTTON ;  Surgeon: [name], MD;  [facility];  Service: Urology;  Laterality: N/A;       Home Medications    Prior to Admission medications   Medication Sig Start Date End Date Taking? Authorizing Provider  predniSONE (DELTASONE) 20 MG tablet Take 2 tablets (40 mg total) by mouth daily with breakfast for 5 days. 07/28/21 08/02/21 Yes Roselynne Lortz-Warren, Alda Lea, NP  triamcinolone cream (KENALOG) 0.1 % Apply 1 application  topically 2 (two) times daily. 07/28/21  Yes Jaquanna Ballentine-Warren, Alda Lea, NP  ALPRAZolam Duanne Moron) 0.25 MG tablet Take 0.25 mg by mouth at bedtime as needed. 03/18/19   [provider]  aspirin EC 81 MG tablet Take 81 mg by mouth daily.    [provider]  atorvastatin (LIPITOR) 40 MG tablet Take 1 tablet (40 mg total) by  mouth every evening. 06/05/14   Angiulli, Lavon Paganini, PA-C  azithromycin (ZITHROMAX) 250 MG tablet Take 1 tablet (250 mg total) by mouth daily. Take first 2 tablets together, then 1 every day until finished. 07/23/21   Lichelle Viets-Warren, Alda Lea, NP  benzonatate (TESSALON PERLES) 100 MG capsule Take 1 capsule (100 mg total) by mouth every 8 (eight) hours as needed for cough. 07/23/21   Adryanna Friedt-Warren, Alda Lea, NP  Cholecalciferol (VITAMIN D PO) Take 1 capsule by mouth every evening.    [provider]  clopidogrel (PLAVIX) 75 MG tablet TAKE 1 TABLET DAILY Patient taking differently: Take 75 mg by mouth every morning. 05/14/15   Rosalin Hawking, MD  finasteride (PROSCAR) 5 MG tablet Take 1 tablet (5 mg total) by mouth daily. 02/15/21   Franchot Gallo, MD  fluticasone (FLONASE) 50 MCG/ACT nasal spray Place 2 sprays into both nostrils daily. 07/23/21   Sommer Spickard-Warren, Alda Lea, NP  hydrALAZINE (APRESOLINE) 25 MG tablet Take 1  tablet (25 mg total) by mouth 3 (three) times daily. 10/08/20   Tolia, Sunit, DO  olmesartan (BENICAR) 40 MG tablet TAKE 1 TABLET DAILY 12/09/20   Tolia, Sunit, DO  pantoprazole (PROTONIX) 40 MG tablet Take 40 mg by mouth daily.    [provider]  polyethylene glycol (MIRALAX / GLYCOLAX) packet Take 17 g by mouth every morning.    [provider]  spironolactone (ALDACTONE) 25 MG tablet Take 25 mg by mouth daily. 06/01/19   [provider]    Family History Family History  Problem Relation Age of Onset   Thyroid disease Mother    Colon cancer Neg Hx     Social History Social History   Tobacco Use   Smoking status: Former    Packs/day: 0.50    Years: 35.00    Total pack years: 17.50    Types: Cigarettes    Quit date: 1986    Years since quitting: 37.4   Smokeless tobacco: Never  Vaping Use   Vaping Use: Never used  Substance Use Topics   Alcohol use: Yes    Alcohol/week: 3.0 standard drinks of alcohol    Types: 3 Glasses of wine per  week    Comment: 1 glass of wine every day   Drug use: No     Allergies   Patient has no known allergies.   Review of Systems Review of Systems Per HPI  Physical Exam Triage Vital Signs ED Triage Vitals  Enc Vitals Group     BP 07/28/21 0822 (!) 146/74     Pulse Rate 07/28/21 0822 68     Resp 07/28/21 0822 18     Temp 07/28/21 0822 97.9 F (36.6 C)     Temp Source 07/28/21 0822 Oral     SpO2 07/28/21 0822 95 %     Weight --      Height --      Head Circumference --      Peak Flow --      Pain Score 07/28/21 0824 0     Pain Loc --      Pain Edu? --      Excl. in [place]? --    No data found.  Updated Vital Signs BP (!) 146/74 (BP Location: Right Arm)   Pulse 68   Temp 97.9 F (36.6 C) (Oral)   Resp 18   SpO2 95%   Visual Acuity Right Eye Distance:   Left Eye Distance:   Bilateral Distance:    Right Eye Near:   Left Eye Near:    Bilateral Near:     Physical Exam Vitals and nursing note reviewed.  Constitutional:      Appearance: Normal appearance.  HENT:     Head: Normocephalic.     Mouth/Throat:     Mouth: Mucous membranes are moist.  Eyes:     Extraocular Movements: Extraocular movements intact.     Conjunctiva/sclera: Conjunctivae normal.     Pupils: Pupils are equal, round, and reactive to light.  Cardiovascular:     Pulses: Normal pulses.     Heart sounds: Normal heart sounds.  Pulmonary:     Effort: Pulmonary effort is normal.     Breath sounds: Wheezing present.  Abdominal:     General: Bowel sounds are normal.     Palpations: Abdomen is soft.  Skin:    General: Skin is warm and dry.     Findings: Rash present. Rash is urticarial.  Comments: Rash covering large amount of patient's abdomen and flank and left upper arm..  Large erythematous plaques are present in various sizes.  There is no congruent pattern, but rash is limited to the left side.  No swelling, drainage, blistering, crusting, or scaling present.  Neurological:      General: No focal deficit present.     Mental Status: He is alert and oriented to person, place, and time.  Psychiatric:        Mood and Affect: Mood normal.        Behavior: Behavior normal.      UC Treatments / Results  Labs (all labs ordered are listed, but only abnormal results are displayed) Labs Reviewed - No data to display  EKG   Radiology No results found.  Procedures Procedures (including critical care time)  Medications Ordered in UC Medications  dexamethasone (DECADRON) injection 10 mg (has no administration in time range)    Initial Impression / Assessment and Plan / UC Course  I have reviewed the triage vital signs and the nursing notes.  Pertinent labs & imaging results that were available during my care of the patient were reviewed by me and considered in my medical decision making (see chart for details).  Patient presents with rash to his left torso and left upper arm that has been present for the past 24 hours.  The rash presents in large plaques in various sizes to the left abdomen and upper arm.  The rash is erythematous.  Cannot determine trigger for the patient's symptoms.  We will treat patient with Decadron injection of 10 mg today in the clinic.  We will also start patient on prednisone for 5 days, along with triamcinolone cream for topical application.  Supportive care recommendations were provided to the patient.  Patient advised to follow-up Final Clinical Impressions(s) / UC Diagnoses   Final diagnoses:  Rash and nonspecific skin eruption     Discharge Instructions      ToTake medication as prescribed. May also take over-the-counter Zyrtec to help with itching. Avoid hot baths or showers while symptoms persist.  Recommend taking lukewarm baths. May apply cool cloths to the area to help with itching or discomfort. Avoid scratching, rubbing, or manipulating the areas while symptoms persist. Recommend Aveeno colloidal oatmeal bath to use to  help with drying and itching. Follow up if symptoms do not improve.     ED Prescriptions     Medication Sig Dispense Auth. Provider   predniSONE (DELTASONE) 20 MG tablet Take 2 tablets (40 mg total) by mouth daily with breakfast for 5 days. 10 tablet Brittian Renaldo-Warren, Alda Lea, NP   triamcinolone cream (KENALOG) 0.1 % Apply 1 application  topically 2 (two) times daily. 45 g Desire Fulp-Warren, Alda Lea, NP      PDMP not reviewed this encounter.   Tish Men, NP 07/28/21 [phone]

## 2021-07-28 NOTE — ED Triage Notes (Signed)
Rash on left side of body that started last night and itches.

## 2021-07-30 ENCOUNTER — Ambulatory Visit
Admission: EM | Admit: 2021-07-30 | Discharge: 2021-07-30 | Disposition: A | Discharge status: Home / Self Care | Payer: Medicare Other | Attending: Family Medicine | Admitting: Family Medicine

## 2021-07-30 DIAGNOSIS — L509 Urticaria, unspecified: Secondary | ICD-10-CM

## 2021-07-30 DIAGNOSIS — R051 Acute cough: Secondary | ICD-10-CM | POA: Diagnosis not present

## 2021-07-30 MED ORDER — PREDNISONE 10 MG (48) PO TBPK: ORAL_TABLET | ORAL | 0 refills | Status: DC

## 2021-07-30 MED ORDER — HYDROCODONE BIT-HOMATROP MBR 5-1.5 MG/5ML PO SOLN: 5.0000 mL | Freq: Four times a day (QID) | ORAL | 0 refills | Status: DC | PRN

## 2021-07-30 NOTE — Discharge Instructions (Signed)
Be aware, your cough medication may cause drowsiness. Please do not drive, operate heavy machinery or make important decisions while on this medication, it can cloud your judgement.  

## 2021-07-30 NOTE — ED Triage Notes (Signed)
Pt states about 3 days ago he came to Urgent care for his rash and it is not getting better  Pt states that the rash is down his left side, on his right side and his right arm and it is burning  Pt states he is coughing with congestion  Denies Fever

## 2021-08-01 NOTE — ED Provider Notes (Signed)
[place]   [id number] 07/30/21 Arrival Time: 0830  ASSESSMENT & PLAN:  1. Urticaria   2. Acute cough    Discussed possibility of atypical shingles but this looks urticarial to me. OTC symptom care as needed.  Discharge Medication List as of 07/30/2021  9:53 AM     START taking these medications   Details  HYDROcodone bit-homatropine (HYCODAN) 5-1.5 MG/5ML syrup Take 5 mLs by mouth every 6 (six) hours as needed for cough., Starting Sat 07/30/2021, Normal    predniSONE (STERAPRED UNI-PAK 48 TAB) 10 MG (48) TBPK tablet Take as directed., Normal         Follow-up Information     Donnajean Lopes, MD.   Specialty: Internal Medicine Why: As needed. Contact information: [address] [place] [address] [phone]                 Reviewed expectations re: course of current medical issues. Questions answered. Outlined signs and symptoms indicating need for more acute intervention. Understanding verbalized. After Visit Summary given.   SUBJECTIVE: History from: Patient. Henry [name] is a 87 y.o. male. Pt states about 3 days ago he came to Urgent Care for his rash and it is not getting better; rash is down his left side, on his right side and his right arm and it is burning. No pain. Treated with prednisone and kenalog cream. Ques if he took correctly; describes taking on pill per day which would be a very low dose. Also with mild cough and congestion for a few days. Afebrile.  OBJECTIVE:  Vitals:   07/30/21 0926  BP: (!) 120/59  Pulse: 68  Resp: 16  Temp: 97.9 F (36.6 C)  TempSrc: Oral  SpO2: 98%    General appearance: alert; no distress Eyes: PERRLA; EOMI; conjunctiva normal HENT: [place]; AT; without nasal congestion Neck: supple  Lungs: speaks full sentences without difficulty; unlabored; dry cough Extremities: no edema Skin: warm and dry; smooth, slightly elevated and erythematous plaques of variable size over his lower abdomen  and back that do cross midline; no ulcers; no vesicles; no signs of bacterial skin infection Neurologic: normal gait Psychological: alert and cooperative; normal mood and affect   No Known Allergies  Past Medical History:  Diagnosis Date   Accelerated hypertension 06/29/2014   Arthritis    Back   Chronic kidney disease    Dizziness and giddiness    Enlarged prostate    Esophagitis 05/10/2011   Foley catheter in place    FOOT SPRAIN, RIGHT 07/18/2007   Qualifier: Diagnosis of  By: Aline Brochure MD, [name]     GERD (gastroesophageal reflux disease)    HOH (hard of hearing)    Hx-TIA (transient ischemic attack)    hx of this- wife states 86 years ago-but Dr. Estil Daft told wife he had had several-has 2 stents in carotid artery-left   Hypercholesteremia    Hypertension    Low back pain    Nocturia    Shingles    Stroke [facility])    had a stroke 05/23/2014   Syncope and collapse    Social History   Socioeconomic History   Marital status: Married    Spouse name: Not on file   Number of children: 4   Years of education: 16   Highest education level: Not on file  Occupational History   Occupation: retired    Comment: [employer]  Tobacco Use   Smoking status: Former    Packs/day: 0.50  Years: 35.00    Total pack years: 17.50    Types: Cigarettes    Quit date: 26    Years since quitting: 37.4   Smokeless tobacco: Never  Vaping Use   Vaping Use: Never used  Substance and Sexual Activity   Alcohol use: Yes    Alcohol/week: 3.0 standard drinks of alcohol    Types: 3 Glasses of wine per week    Comment: 1 glass of wine every day   Drug use: No   Sexual activity: Yes    Birth control/protection: None  Other Topics Concern   Not on file  Social History Narrative   Married, retired - [employer]   Right handed   Caffeine use - 3-4 cups coffee daily   Social Determinants of Health   Financial Resource Strain: Not on file  Food Insecurity: Not on file  Transportation Needs:  Not on file  Physical Activity: Not on file  Stress: Not on file  Social Connections: Not on file  Intimate Partner Violence: Not on file   Family History  Problem Relation Age of Onset   Thyroid disease Mother    Colon cancer Neg Hx    Past Surgical History:  Procedure Laterality Date   carotid stents  05/2014   2 stents in carotid artery on left side   COLONOSCOPY     COLONOSCOPY  05/17/2011   Procedure: COLONOSCOPY;  Surgeon: [name], MD;  [facility];  Service: Endoscopy;  Laterality: N/A;  830   epidural steroid injection  05/22/2014   had this in lumbar and at 1 am on 05/23/2014 had a stroke   ESOPHAGOGASTRODUODENOSCOPY  10/28/2010   Procedure: ESOPHAGOGASTRODUODENOSCOPY (EGD);  Surgeon: [name], MD;  [facility];  Service: Endoscopy;  Laterality: N/A;  10:30   ESOPHAGOGASTRODUODENOSCOPY N/A 04/24/2014   Procedure: ESOPHAGOGASTRODUODENOSCOPY (EGD);  Surgeon: [name], MD;  [facility];  Service: Endoscopy;  Laterality: N/A;  815   ESOPHAGOGASTRODUODENOSCOPY N/A 06/26/2014   Procedure: ESOPHAGOGASTRODUODENOSCOPY (EGD);  Surgeon: [name], MD;  [facility];  Service: Endoscopy;  Laterality: N/A;   EUS N/A 06/26/2014   Procedure: FULL UPPER ENDOSCOPIC ULTRASOUND (EUS) RADIAL;  Surgeon: [name], MD;  [facility];  Service: Endoscopy;  Laterality: N/A;   IR GENERIC HISTORICAL  10/12/2015   IR ANGIO VERTEBRAL SEL VERTEBRAL BILAT MOD SED 10/12/2015 Luanne Bras, MD [facility]   IR GENERIC HISTORICAL  10/12/2015   IR ANGIO INTRA EXTRACRAN SEL COM CAROTID INNOMINATE BILAT MOD SED 10/12/2015 Luanne Bras, MD [facility]   RADIOLOGY WITH ANESTHESIA N/A 05/23/2014   Procedure: RADIOLOGY WITH ANESTHESIA;  Surgeon: Medication Radiologist, MD;  Location: [place];  Service: Radiology;  Laterality: N/A;   RESECTION DISTAL CLAVICAL Right 12/13/2012   Procedure: RESECTION DISTAL CLAVICAL;  Surgeon: Carole Civil, MD;  [facility];  Service: Orthopedics;  Laterality: Right;   SHOULDER ARTHROSCOPY WITH BICEPSTENOTOMY Right 12/13/2012   Procedure: SHOULDER ARTHROSCOPY WITH BICEPS TENOTOMY, limited shoulder debridement;  Surgeon: Carole Civil, MD;  [facility];  Service: Orthopedics;  Laterality: Right;   SHOULDER SURGERY Right    torn cartiledge   SHOULDER SURGERY     right shoulder   TRANSURETHRAL RESECTION OF PROSTATE N/A 07/09/2014   Procedure: TRANSURETHRAL RESECTION of prostate WITH BUTTON ;  Surgeon: [name], MD;  [facility];  Service: Urology;  Laterality: N/AVanessa Kick, MD 08/01/21 1142

## 2021-08-08 ENCOUNTER — Encounter: Payer: Self-pay | Admitting: Adult Health

## 2021-08-08 ENCOUNTER — Ambulatory Visit (INDEPENDENT_AMBULATORY_CARE_PROVIDER_SITE_OTHER): Payer: Medicare Other | Admitting: Adult Health

## 2021-08-08 VITALS — BP 123/72 | HR 67 | Ht 67.0 in | Wt 234.6 lb

## 2021-08-08 DIAGNOSIS — I6522 Occlusion and stenosis of left carotid artery: Secondary | ICD-10-CM

## 2021-08-08 DIAGNOSIS — Z9989 Dependence on other enabling machines and devices: Secondary | ICD-10-CM | POA: Diagnosis not present

## 2021-08-08 DIAGNOSIS — G4733 Obstructive sleep apnea (adult) (pediatric): Secondary | ICD-10-CM | POA: Diagnosis not present

## 2021-09-01 ENCOUNTER — Other Ambulatory Visit: Payer: Self-pay

## 2021-09-01 ENCOUNTER — Encounter: Payer: Self-pay | Admitting: Emergency Medicine

## 2021-09-01 ENCOUNTER — Ambulatory Visit
Admission: EM | Admit: 2021-09-01 | Discharge: 2021-09-01 | Disposition: A | Discharge status: Home / Self Care | Payer: Medicare Other | Attending: Family Medicine | Admitting: Family Medicine

## 2021-09-01 DIAGNOSIS — H6123 Impacted cerumen, bilateral: Secondary | ICD-10-CM

## 2021-09-01 MED ORDER — CARBAMIDE PEROXIDE 6.5 % OT SOLN: 5.0000 [drp] | Freq: Two times a day (BID) | OTIC | 0 refills | Status: DC | PRN

## 2021-09-01 NOTE — ED Provider Notes (Signed)
[facility]    CSN: [id number] Arrival date & time: 09/01/21  4580      History   Chief Complaint Chief Complaint  Patient presents with   Ear Fullness    HPI DECODA VAN is a 87 y.o. male.   Presenting today with whistling sound in bilateral ears for the past month.  He does wear hearing aids.  He states the [place] told him that it could be a sign of earwax impaction and that he should go get checked out.  Denies ear pain, drainage, fevers, chills, headache.  Not trying anything over-the-counter for symptoms.    Past Medical History:  Diagnosis Date   Accelerated hypertension 06/29/2014   Arthritis    Back   Chronic kidney disease    Dizziness and giddiness    Enlarged prostate    Esophagitis 05/10/2011   Foley catheter in place    FOOT SPRAIN, RIGHT 07/18/2007   Qualifier: Diagnosis of  By: Aline Brochure MD, [name]     GERD (gastroesophageal reflux disease)    HOH (hard of hearing)    Hx-TIA (transient ischemic attack)    hx of this- wife states 16 years ago-but Dr. Estil Daft told wife he had had several-has 2 stents in carotid artery-left   Hypercholesteremia    Hypertension    Low back pain    Nocturia    Shingles    Stroke [facility])    had a stroke 05/23/2014   Syncope and collapse     Patient Active Problem List   Diagnosis Date Noted   Slowing, urinary stream 02/14/2021   Syncope 11/02/2017   Syncope and collapse 03/08/2015   Orthostatic hypotension 03/08/2015   Accelerated hypertension 08/28/2014   BPH (benign prostatic hypertrophy) with urinary obstruction 07/09/2014   Right leg swelling 07/06/2014   UTI (urinary tract infection) 07/06/2014   ARF (acute renal failure) as a complication of the indwelling Foley 07/05/2014   Cerebral infarction due to embolism of left middle cerebral artery ([place]) 06/29/2014   Carotid stenosis 06/29/2014   Urinary retention 06/29/2014   Hematuria 06/29/2014   Aphasia S/P CVA 05/28/2014   Dysphagia,  pharyngoesophageal phase 05/28/2014   Left middle cerebral artery stroke ([place]) 05/26/2014   Coarctation of aorta, recurrent, post-intervention    Expressive aphasia    Acute right-sided weakness    Stroke ([place]) 05/23/2014   S/P shoulder surgery 12/24/2012   S/P arthroscopy of shoulder 12/16/2012   Shoulder arthritis 12/13/2012   Biceps tendon rupture, proximal 12/13/2012   Partial tear of right subscapularis tendon 12/13/2012   Hypertension 05/10/2011   Hyperlipidemia 05/10/2011   Barrett esophagus 05/10/2011    Past Surgical History:  Procedure Laterality Date   carotid stents  05/2014   2 stents in carotid artery on left side   COLONOSCOPY     COLONOSCOPY  05/17/2011   Procedure: COLONOSCOPY;  Surgeon: [name], MD;  [facility];  Service: Endoscopy;  Laterality: N/A;  830   epidural steroid injection  05/22/2014   had this in lumbar and at 1 am on 05/23/2014 had a stroke   ESOPHAGOGASTRODUODENOSCOPY  10/28/2010   Procedure: ESOPHAGOGASTRODUODENOSCOPY (EGD);  Surgeon: [name], MD;  [facility];  Service: Endoscopy;  Laterality: N/A;  10:30   ESOPHAGOGASTRODUODENOSCOPY N/A 04/24/2014   Procedure: ESOPHAGOGASTRODUODENOSCOPY (EGD);  Surgeon: [name], MD;  [facility];  Service: Endoscopy;  Laterality: N/A;  815   ESOPHAGOGASTRODUODENOSCOPY N/A 06/26/2014   Procedure: ESOPHAGOGASTRODUODENOSCOPY (  EGD);  Surgeon: [name], MD;  [facility];  Service: Endoscopy;  Laterality: N/A;   EUS N/A 06/26/2014   Procedure: FULL UPPER ENDOSCOPIC ULTRASOUND (EUS) RADIAL;  Surgeon: [name], MD;  [facility];  Service: Endoscopy;  Laterality: N/A;   IR GENERIC HISTORICAL  10/12/2015   IR ANGIO VERTEBRAL SEL VERTEBRAL BILAT MOD SED 10/12/2015 Luanne Bras, MD [facility]   IR GENERIC HISTORICAL  10/12/2015   IR ANGIO INTRA EXTRACRAN SEL COM CAROTID INNOMINATE BILAT MOD SED 10/12/2015 Luanne Bras, MD [facility]   RADIOLOGY WITH ANESTHESIA N/A 05/23/2014   Procedure: RADIOLOGY WITH ANESTHESIA;  Surgeon: Medication Radiologist, MD;  Location: [place];  Service: Radiology;  Laterality: N/A;   RESECTION DISTAL CLAVICAL Right 12/13/2012   Procedure: RESECTION DISTAL CLAVICAL;  Surgeon: Carole Civil, MD;  [facility];  Service: Orthopedics;  Laterality: Right;   SHOULDER ARTHROSCOPY WITH BICEPSTENOTOMY Right 12/13/2012   Procedure: SHOULDER ARTHROSCOPY WITH BICEPS TENOTOMY, limited shoulder debridement;  Surgeon: Carole Civil, MD;  [facility];  Service: Orthopedics;  Laterality: Right;   SHOULDER SURGERY Right    torn cartiledge   SHOULDER SURGERY     right shoulder   TRANSURETHRAL RESECTION OF PROSTATE N/A 07/09/2014   Procedure: TRANSURETHRAL RESECTION of prostate WITH BUTTON ;  Surgeon: [name], MD;  [facility];  Service: Urology;  Laterality: N/A;       Home Medications    Prior to Admission medications   Medication Sig Start Date End Date Taking? Authorizing Provider  carbamide peroxide (DEBROX) 6.5 % OTIC solution Place 5 drops into both ears 2 (two) times daily as needed. 09/01/21  Yes Volney American, PA-C  ALPRAZolam Duanne Moron) 0.25 MG tablet Take 0.25 mg by mouth at bedtime as needed. 03/18/19   [provider]  aspirin EC 81 MG tablet Take 81 mg by mouth daily.    [provider]  atorvastatin (LIPITOR) 40 MG tablet Take 1 tablet (40 mg total) by mouth every evening. 06/05/14   Angiulli, Lavon Paganini, PA-C  azithromycin (ZITHROMAX) 250 MG tablet Take 1 tablet (250 mg total) by mouth daily. Take first 2 tablets together, then 1 every day until finished. 07/23/21   Leath-Warren, Alda Lea, NP  benzonatate (TESSALON PERLES) 100 MG capsule Take 1 capsule (100 mg total) by mouth every 8 (eight) hours as needed for cough. 07/23/21   Leath-Warren, Alda Lea, NP  Cholecalciferol (VITAMIN D PO) Take 1 capsule by mouth every evening.    [provider]  clopidogrel (PLAVIX) 75 MG tablet TAKE 1 TABLET DAILY Patient taking differently: Take 75 mg by mouth every morning. 05/14/15   Rosalin Hawking, MD  finasteride (PROSCAR) 5 MG tablet Take 1 tablet (5 mg total) by mouth daily. 02/15/21   Franchot Gallo, MD  fluticasone (FLONASE) 50 MCG/ACT nasal spray Place 2 sprays into both nostrils daily. 07/23/21   Leath-Warren, Alda Lea, NP  HYDROcodone bit-homatropine (HYCODAN) 5-1.5 MG/5ML syrup Take 5 mLs by mouth every 6 (six) hours as needed for cough. 07/30/21   Vanessa Kick, MD  olmesartan (BENICAR) 40 MG tablet TAKE 1 TABLET DAILY 12/09/20   Tolia, Sunit, DO  pantoprazole (PROTONIX) 40 MG tablet Take 40 mg by mouth daily.    [provider]  polyethylene glycol (MIRALAX / GLYCOLAX) packet Take 17 g by mouth every morning.    [provider]  predniSONE (STERAPRED UNI-PAK 48 TAB) 10 MG (48) TBPK tablet Take as directed. 07/30/21  Vanessa Kick, MD  spironolactone (ALDACTONE) 25 MG tablet Take 25 mg by mouth daily. 06/01/19   [provider]  triamcinolone cream (KENALOG) 0.1 % Apply 1 application  topically 2 (two) times daily. 07/28/21   Leath-Warren, Alda Lea, NP    Family History Family History  Problem Relation Age of Onset   Thyroid disease Mother    Colon cancer Neg Hx     Social History Social History   Tobacco Use   Smoking status: Former    Packs/day: 0.50    Years: 35.00    Total pack years: 17.50    Types: Cigarettes    Quit date: 1986    Years since quitting: 37.5   Smokeless tobacco: Never  Vaping Use   Vaping Use: Never used  Substance Use Topics   Alcohol use: Yes    Alcohol/week: 3.0 standard drinks of alcohol    Types: 3 Glasses of wine per week    Comment: 1 glass of wine every day   Drug use: No     Allergies   Patient has no known allergies.   Review of Systems Review of Systems Per HPI  Physical Exam Triage Vital Signs ED Triage Vitals [09/01/21 0933]  Enc  Vitals Group     BP 113/67     Pulse Rate 72     Resp 18     Temp 98.5 F (36.9 C)     Temp Source Oral     SpO2 96 %     Weight      Height      Head Circumference      Peak Flow      Pain Score 0     Pain Loc      Pain Edu?      Excl. in [place]?    No data found.  Updated Vital Signs BP 113/67 (BP Location: Right Arm)   Pulse 72   Temp 98.5 F (36.9 C) (Oral)   Resp 18   SpO2 96%   Visual Acuity Right Eye Distance:   Left Eye Distance:   Bilateral Distance:    Right Eye Near:   Left Eye Near:    Bilateral Near:     Physical Exam Vitals and nursing note reviewed.  Constitutional:      Appearance: Normal appearance.  HENT:     Head: Atraumatic.     Right Ear: There is impacted cerumen.     Left Ear: There is impacted cerumen.  Eyes:     Extraocular Movements: Extraocular movements intact.     Conjunctiva/sclera: Conjunctivae normal.  Cardiovascular:     Rate and Rhythm: Normal rate and regular rhythm.  Pulmonary:     Effort: Pulmonary effort is normal.     Breath sounds: Normal breath sounds.  Musculoskeletal:        General: Normal range of motion.     Cervical back: Normal range of motion and neck supple.  Skin:    General: Skin is warm and dry.  Neurological:     General: No focal deficit present.     Mental Status: He is oriented to person, place, and time.  Psychiatric:        Mood and Affect: Mood normal.        Thought Content: Thought content normal.        Judgment: Judgment normal.      UC Treatments / Results  Labs (all labs ordered are listed, but only abnormal results are  displayed) Labs Reviewed - No data to display  EKG   Radiology No results found.  Procedures Procedures (including critical care time)  Medications Ordered in UC Medications - No data to display  Initial Impression / Assessment and Plan / UC Course  I have reviewed the triage vital signs and the nursing notes.  Pertinent labs & imaging results that were  available during my care of the patient were reviewed by me and considered in my medical decision making (see chart for details).     Cerumen lavage performed with warm water and peroxide with removal of a portion of the impactions bilaterally.  Patient reports mild symptomatic improvement.  Debrox drops sent for the remainder of the wax that is still present in the ear canals.  Discussed appropriate use and return precautions.  Final Clinical Impressions(s) / UC Diagnoses   Final diagnoses:  Bilateral impacted cerumen   Discharge Instructions   None    ED Prescriptions     Medication Sig Dispense Auth. Provider   carbamide peroxide (DEBROX) 6.5 % OTIC solution Place 5 drops into both ears 2 (two) times daily as needed. 15 mL Volney American, [place]      PDMP not reviewed this encounter.   Volney American, [place] 09/01/21 1007

## 2021-09-01 NOTE — ED Triage Notes (Signed)
Pt reports bilateral hearing aids "whistling" x1 month. pt reports was seen at [facility] for same and was told needed to be evaluated for wax buildup as that can be a "sign".

## 2021-11-24 ENCOUNTER — Other Ambulatory Visit: Payer: Self-pay | Admitting: Cardiology

## 2022-01-25 ENCOUNTER — Other Ambulatory Visit: Payer: Self-pay | Admitting: Urology

## 2022-01-25 DIAGNOSIS — N401 Enlarged prostate with lower urinary tract symptoms: Secondary | ICD-10-CM

## 2022-02-14 ENCOUNTER — Ambulatory Visit: Payer: Medicare Other | Admitting: Urology

## 2022-02-20 NOTE — Progress Notes (Signed)
History of Present Illness: This gentleman comes in today for follow-up of BPH.  He underwent TURP of the prostate by Dr. Jeffie Pollock in 2016.  He is here for annual visit.  He is on finasteride for history of BPH and gross hematuria.  Over the past year, he has had no real urinary issues.  His wife does complain that the floor on the toilet does get wet.  He usually stands to pee but states that he has some right-sided deviation to his stream.  IPSS 12, quality-of-life score 2.  Still on finasteride for the gross hematuria which he has not experienced over the past year.  Past Medical History:  Diagnosis Date   Accelerated hypertension 06/29/2014   Arthritis    Back   Chronic kidney disease    Dizziness and giddiness    Enlarged prostate    Esophagitis 05/10/2011   Foley catheter in place    FOOT SPRAIN, RIGHT 07/18/2007   Qualifier: Diagnosis of  By: Aline Brochure MD, [name]     GERD (gastroesophageal reflux disease)    HOH (hard of hearing)    Hx-TIA (transient ischemic attack)    hx of this- wife states 72 years ago-but Dr. Estil Daft told wife he had had several-has 2 stents in carotid artery-left   Hypercholesteremia    Hypertension    Low back pain    Nocturia    Shingles    Stroke [facility])    had a stroke 05/23/2014   Syncope and collapse     Past Surgical History:  Procedure Laterality Date   carotid stents  05/2014   2 stents in carotid artery on left side   COLONOSCOPY     COLONOSCOPY  05/17/2011   Procedure: COLONOSCOPY;  Surgeon: [name], MD;  [facility];  Service: Endoscopy;  Laterality: N/A;  830   epidural steroid injection  05/22/2014   had this in lumbar and at 1 am on 05/23/2014 had a stroke   ESOPHAGOGASTRODUODENOSCOPY  10/28/2010   Procedure: ESOPHAGOGASTRODUODENOSCOPY (EGD);  Surgeon: [name], MD;  [facility];  Service: Endoscopy;  Laterality: N/A;  10:30   ESOPHAGOGASTRODUODENOSCOPY N/A 04/24/2014   Procedure:  ESOPHAGOGASTRODUODENOSCOPY (EGD);  Surgeon: [name], MD;  [facility];  Service: Endoscopy;  Laterality: N/A;  815   ESOPHAGOGASTRODUODENOSCOPY N/A 06/26/2014   Procedure: ESOPHAGOGASTRODUODENOSCOPY (EGD);  Surgeon: [name], MD;  [facility];  Service: Endoscopy;  Laterality: N/A;   EUS N/A 06/26/2014   Procedure: FULL UPPER ENDOSCOPIC ULTRASOUND (EUS) RADIAL;  Surgeon: [name], MD;  [facility];  Service: Endoscopy;  Laterality: N/A;   IR GENERIC HISTORICAL  10/12/2015   IR ANGIO VERTEBRAL SEL VERTEBRAL BILAT MOD SED 10/12/2015 Luanne Bras, MD [facility]   IR GENERIC HISTORICAL  10/12/2015   IR ANGIO INTRA EXTRACRAN SEL COM CAROTID INNOMINATE BILAT MOD SED 10/12/2015 Luanne Bras, MD [facility]   RADIOLOGY WITH ANESTHESIA N/A 05/23/2014   Procedure: RADIOLOGY WITH ANESTHESIA;  Surgeon: Medication Radiologist, MD;  Location: [place];  Service: Radiology;  Laterality: N/A;   RESECTION DISTAL CLAVICAL Right 12/13/2012   Procedure: RESECTION DISTAL CLAVICAL;  Surgeon: Carole Civil, MD;  [facility];  Service: Orthopedics;  Laterality: Right;   SHOULDER ARTHROSCOPY WITH BICEPSTENOTOMY Right 12/13/2012   Procedure: SHOULDER ARTHROSCOPY WITH BICEPS TENOTOMY, limited shoulder debridement;  Surgeon: Carole Civil, MD;  [facility];  Service: Orthopedics;  Laterality: Right;   SHOULDER SURGERY Right  torn cartiledge   SHOULDER SURGERY     right shoulder   TRANSURETHRAL RESECTION OF PROSTATE N/A 07/09/2014   Procedure: TRANSURETHRAL RESECTION of prostate WITH BUTTON ;  Surgeon: [name], MD;  [facility];  Service: Urology;  Laterality: N/A;    Home Medications:  Allergies as of 02/21/2022   No Known Allergies      Medication List        Accurate as of February 20, 2022  7:40 PM. If you have any questions, ask your nurse or doctor.          ALPRAZolam 0.25 MG tablet Commonly known as: XANAX Take 0.25 mg  by mouth at bedtime as needed.   aspirin EC 81 MG tablet Take 81 mg by mouth daily.   atorvastatin 40 MG tablet Commonly known as: LIPITOR Take 1 tablet (40 mg total) by mouth every evening.   azithromycin 250 MG tablet Commonly known as: ZITHROMAX Take 1 tablet (250 mg total) by mouth daily. Take first 2 tablets together, then 1 every day until finished.   benzonatate 100 MG capsule Commonly known as: Tessalon Perles Take 1 capsule (100 mg total) by mouth every 8 (eight) hours as needed for cough.   carbamide peroxide 6.5 % OTIC solution Commonly known as: DEBROX Place 5 drops into both ears 2 (two) times daily as needed.   clopidogrel 75 MG tablet Commonly known as: PLAVIX TAKE 1 TABLET DAILY What changed: when to take this   finasteride 5 MG tablet Commonly known as: PROSCAR TAKE 1 TABLET DAILY   fluticasone 50 MCG/ACT nasal spray Commonly known as: FLONASE Place 2 sprays into both nostrils daily.   HYDROcodone bit-homatropine 5-1.5 MG/5ML syrup Commonly known as: HYCODAN Take 5 mLs by mouth every 6 (six) hours as needed for cough.   olmesartan 40 MG tablet Commonly known as: BENICAR TAKE 1 TABLET DAILY   pantoprazole 40 MG tablet Commonly known as: PROTONIX Take 40 mg by mouth daily.   polyethylene glycol 17 g packet Commonly known as: MIRALAX / GLYCOLAX Take 17 g by mouth every morning.   predniSONE 10 MG (48) Tbpk tablet Commonly known as: STERAPRED UNI-PAK 48 TAB Take as directed.   spironolactone 25 MG tablet Commonly known as: ALDACTONE Take 25 mg by mouth daily.   triamcinolone cream 0.1 % Commonly known as: KENALOG Apply 1 application  topically 2 (two) times daily.   VITAMIN D PO Take 1 capsule by mouth every evening.        Allergies: No Known Allergies  Family History  Problem Relation Age of Onset   Thyroid disease Mother    Colon cancer Neg Hx     Social History:  reports that he quit smoking about 38 years ago. His smoking  use included cigarettes. He has a 17.50 pack-year smoking history. He has never used smokeless tobacco. He reports current alcohol use of about 3.0 standard drinks of alcohol per week. He reports that he does not use drugs.  ROS: A complete review of systems was performed.  All systems are negative except for pertinent findings as noted.  Physical Exam:  Vital signs in last 24 hours: There were no vitals taken for this visit. Constitutional:  Alert and oriented, No acute distress Cardiovascular: Regular rate  Respiratory: Normal respiratory effort Psychiatric: Normal mood and affect  I have reviewed prior pt notes  I have reviewed notes from referring/previous physicians  I have reviewed urinalysis results  I have reviewed prior pathology  results--1.5 g of tissue resected, all benign  IPSS results reviewed    Impression/Assessment:  1.  BPH with symptoms, status post TURP in 2016 with continued good urinary function  2.  History of gross hematuria, none recently on finasteride  3.  Spraying of stream  Plan:  1.  Either sit down to void, or perhaps stretch urethral meatus before voiding which may decrease his spraying  2.  Continue finasteride  3.  As he is doing well, I do not think he needs annual follow-up-I would have him go back to see Dr. Philip Aspen for finasteride.  Return as needed

## 2022-02-21 ENCOUNTER — Ambulatory Visit (INDEPENDENT_AMBULATORY_CARE_PROVIDER_SITE_OTHER): Payer: Medicare Other | Admitting: Urology

## 2022-02-21 ENCOUNTER — Encounter: Payer: Self-pay | Admitting: Urology

## 2022-02-21 VITALS — BP 117/66 | HR 77

## 2022-02-21 DIAGNOSIS — R39198 Other difficulties with micturition: Secondary | ICD-10-CM | POA: Diagnosis not present

## 2022-02-21 DIAGNOSIS — N401 Enlarged prostate with lower urinary tract symptoms: Secondary | ICD-10-CM

## 2022-02-21 LAB — URINALYSIS, ROUTINE W REFLEX MICROSCOPIC
Bilirubin, UA: NEGATIVE
Glucose, UA: NEGATIVE
Ketones, UA: NEGATIVE
Leukocytes,UA: NEGATIVE
Nitrite, UA: NEGATIVE
Protein,UA: NEGATIVE
RBC, UA: NEGATIVE
Specific Gravity, UA: 1.005 (ref 1.005–1.030)
Urobilinogen, Ur: 0.2 mg/dL (ref 0.2–1.0)
pH, UA: 6 (ref 5.0–7.5)

## 2022-04-06 DIAGNOSIS — E785 Hyperlipidemia, unspecified: Secondary | ICD-10-CM | POA: Diagnosis not present

## 2022-04-06 DIAGNOSIS — R7989 Other specified abnormal findings of blood chemistry: Secondary | ICD-10-CM | POA: Diagnosis not present

## 2022-04-06 DIAGNOSIS — I10 Essential (primary) hypertension: Secondary | ICD-10-CM | POA: Diagnosis not present

## 2022-04-06 DIAGNOSIS — Z125 Encounter for screening for malignant neoplasm of prostate: Secondary | ICD-10-CM | POA: Diagnosis not present

## 2022-04-13 DIAGNOSIS — I10 Essential (primary) hypertension: Secondary | ICD-10-CM | POA: Diagnosis not present

## 2022-04-13 DIAGNOSIS — I6930 Unspecified sequelae of cerebral infarction: Secondary | ICD-10-CM | POA: Diagnosis not present

## 2022-04-13 DIAGNOSIS — R42 Dizziness and giddiness: Secondary | ICD-10-CM | POA: Diagnosis not present

## 2022-04-13 DIAGNOSIS — I130 Hypertensive heart and chronic kidney disease with heart failure and stage 1 through stage 4 chronic kidney disease, or unspecified chronic kidney disease: Secondary | ICD-10-CM | POA: Diagnosis not present

## 2022-04-13 DIAGNOSIS — E785 Hyperlipidemia, unspecified: Secondary | ICD-10-CM | POA: Diagnosis not present

## 2022-04-13 DIAGNOSIS — G4733 Obstructive sleep apnea (adult) (pediatric): Secondary | ICD-10-CM | POA: Diagnosis not present

## 2022-04-13 DIAGNOSIS — I5032 Chronic diastolic (congestive) heart failure: Secondary | ICD-10-CM | POA: Diagnosis not present

## 2022-04-13 DIAGNOSIS — Z1331 Encounter for screening for depression: Secondary | ICD-10-CM | POA: Diagnosis not present

## 2022-04-13 DIAGNOSIS — R82998 Other abnormal findings in urine: Secondary | ICD-10-CM | POA: Diagnosis not present

## 2022-04-13 DIAGNOSIS — Z1339 Encounter for screening examination for other mental health and behavioral disorders: Secondary | ICD-10-CM | POA: Diagnosis not present

## 2022-04-13 DIAGNOSIS — N1831 Chronic kidney disease, stage 3a: Secondary | ICD-10-CM | POA: Diagnosis not present

## 2022-04-13 DIAGNOSIS — Z0000 Encounter for general adult medical examination without abnormal findings: Secondary | ICD-10-CM | POA: Diagnosis not present

## 2022-04-21 DIAGNOSIS — M9903 Segmental and somatic dysfunction of lumbar region: Secondary | ICD-10-CM | POA: Diagnosis not present

## 2022-04-21 DIAGNOSIS — M9905 Segmental and somatic dysfunction of pelvic region: Secondary | ICD-10-CM | POA: Diagnosis not present

## 2022-04-21 DIAGNOSIS — M62830 Muscle spasm of back: Secondary | ICD-10-CM | POA: Diagnosis not present

## 2022-04-21 DIAGNOSIS — M9902 Segmental and somatic dysfunction of thoracic region: Secondary | ICD-10-CM | POA: Diagnosis not present

## 2022-05-18 DIAGNOSIS — N400 Enlarged prostate without lower urinary tract symptoms: Secondary | ICD-10-CM | POA: Diagnosis not present

## 2022-05-18 DIAGNOSIS — I10 Essential (primary) hypertension: Secondary | ICD-10-CM | POA: Diagnosis not present

## 2022-05-18 DIAGNOSIS — R319 Hematuria, unspecified: Secondary | ICD-10-CM | POA: Diagnosis not present

## 2022-06-19 DIAGNOSIS — M9905 Segmental and somatic dysfunction of pelvic region: Secondary | ICD-10-CM | POA: Diagnosis not present

## 2022-06-19 DIAGNOSIS — M9902 Segmental and somatic dysfunction of thoracic region: Secondary | ICD-10-CM | POA: Diagnosis not present

## 2022-06-19 DIAGNOSIS — M9903 Segmental and somatic dysfunction of lumbar region: Secondary | ICD-10-CM | POA: Diagnosis not present

## 2022-06-19 DIAGNOSIS — M62830 Muscle spasm of back: Secondary | ICD-10-CM | POA: Diagnosis not present

## 2022-06-30 ENCOUNTER — Ambulatory Visit: Payer: Medicare Other | Admitting: Cardiology

## 2022-06-30 ENCOUNTER — Encounter: Payer: Self-pay | Admitting: Cardiology

## 2022-06-30 ENCOUNTER — Other Ambulatory Visit: Payer: Self-pay

## 2022-06-30 VITALS — BP 142/76 | HR 72 | Resp 18 | Ht 67.0 in | Wt 241.8 lb

## 2022-06-30 DIAGNOSIS — Z95828 Presence of other vascular implants and grafts: Secondary | ICD-10-CM | POA: Diagnosis not present

## 2022-06-30 DIAGNOSIS — Z6837 Body mass index (BMI) 37.0-37.9, adult: Secondary | ICD-10-CM

## 2022-06-30 DIAGNOSIS — Z87891 Personal history of nicotine dependence: Secondary | ICD-10-CM | POA: Diagnosis not present

## 2022-06-30 DIAGNOSIS — Z8673 Personal history of transient ischemic attack (TIA), and cerebral infarction without residual deficits: Secondary | ICD-10-CM | POA: Diagnosis not present

## 2022-06-30 DIAGNOSIS — E782 Mixed hyperlipidemia: Secondary | ICD-10-CM | POA: Diagnosis not present

## 2022-06-30 DIAGNOSIS — I6523 Occlusion and stenosis of bilateral carotid arteries: Secondary | ICD-10-CM | POA: Diagnosis not present

## 2022-06-30 DIAGNOSIS — I10 Essential (primary) hypertension: Secondary | ICD-10-CM

## 2022-06-30 MED ORDER — NEXLIZET 180-10 MG PO TABS: 1.0000 | ORAL_TABLET | Freq: Every day | ORAL | 0 refills | Status: DC

## 2022-06-30 NOTE — Progress Notes (Signed)
Henry [name] Date of Birth: November 14, 1934 MRN: [id number] Primary Care Provider:Paterson, Barry Dienes, MD Former Cardiology Providers: Altamese Spencer, APRN, FNP-C Primary Cardiologist: Tessa Lerner, DO, [facility] (established care 06/19/2019)  Date: 06/30/22 Last Office Visit: 06/30/2021  Chief Complaint  Patient presents with   carotid disease   Follow-up    HPI  Henry [name] is a 88 y.o.  male whose past medical history and cardiovascular risk factors include: Hyperlipidemia, hypertension, hyperglycemia, history of carotid artery disease status post stenting, history of CVA in 2016, history of syncope, advanced age, obesity due to excess calories.  Patient is accompanied by his wife at today's office visit.    Patient and his wife present for 6-month follow-up visit.  He has undergone left ICA stenting in the past and he was supposed to have a repeat carotid duplex to evaluate disease progression in May 2024 but this is still pending.    Clinically denies any changes in vision or focal neurological deficits.  Most recent labs provided by the patient's wife at today's office visit.  Noted below for further reference.  He denies anginal chest pain or heart failure symptoms.  Office blood pressures are not well-controlled but home blood pressures are according to him (SBP 120-130 mmHg).  FUNCTIONAL STATUS: He walks 1.5 - 2 miles three times a week.    ALLERGIES: No Known Allergies   MEDICATION LIST PRIOR TO VISIT: Current Outpatient Medications on File Prior to Visit  Medication Sig Dispense Refill   ALPRAZolam (XANAX) 0.25 MG tablet Take 0.25 mg by mouth at bedtime as needed.     aspirin EC 81 MG tablet Take 81 mg by mouth daily.     atorvastatin (LIPITOR) 40 MG tablet Take 1 tablet (40 mg total) by mouth every evening. 30 tablet 1   Cholecalciferol (VITAMIN D PO) Take 1 capsule by mouth every evening.     clopidogrel (PLAVIX) 75 MG tablet TAKE 1 TABLET DAILY (Patient taking  differently: Take 75 mg by mouth every morning.) 90 tablet 3   finasteride (PROSCAR) 5 MG tablet TAKE 1 TABLET DAILY 100 tablet 3   fluticasone (FLONASE) 50 MCG/ACT nasal spray Place 2 sprays into both nostrils daily. 16 g 0   olmesartan (BENICAR) 40 MG tablet TAKE 1 TABLET DAILY 90 tablet 3   pantoprazole (PROTONIX) 40 MG tablet Take 40 mg by mouth daily.     polyethylene glycol (MIRALAX / GLYCOLAX) packet Take 17 g by mouth every morning.     spironolactone (ALDACTONE) 25 MG tablet Take 25 mg by mouth daily.     No current facility-administered medications on file prior to visit.    PAST MEDICAL HISTORY: Past Medical History:  Diagnosis Date   Accelerated hypertension 06/29/2014   Arthritis    Back   Chronic kidney disease    Dizziness and giddiness    Enlarged prostate    Esophagitis 05/10/2011   Foley catheter in place    FOOT SPRAIN, RIGHT 07/18/2007   Qualifier: Diagnosis of  By: Romeo Apple MD, Duffy Rhody     GERD (gastroesophageal reflux disease)    HOH (hard of hearing)    Hx-TIA (transient ischemic attack)    hx of this- wife states 10 years ago-but Dr. Robyn Haber told wife he had had several-has 2 stents in carotid artery-left   Hypercholesteremia    Hypertension    Low back pain    Nocturia    Shingles    Stroke [facility])    had a stroke  05/23/2014   Syncope and collapse     PAST SURGICAL HISTORY: Past Surgical History:  Procedure Laterality Date   carotid stents  05/2014   2 stents in carotid artery on left side   COLONOSCOPY     COLONOSCOPY  05/17/2011   Procedure: COLONOSCOPY;  Surgeon: Malissa Hippo, MD;  [facility];  Service: Endoscopy;  Laterality: N/A;  830   epidural steroid injection  05/22/2014   had this in lumbar and at 1 am on 05/23/2014 had a stroke   ESOPHAGOGASTRODUODENOSCOPY  10/28/2010   Procedure: ESOPHAGOGASTRODUODENOSCOPY (EGD);  Surgeon: Malissa Hippo, MD;  [facility];  Service: Endoscopy;  Laterality: N/A;  10:30    ESOPHAGOGASTRODUODENOSCOPY N/A 04/24/2014   Procedure: ESOPHAGOGASTRODUODENOSCOPY (EGD);  Surgeon: Malissa Hippo, MD;  [facility];  Service: Endoscopy;  Laterality: N/A;  815   ESOPHAGOGASTRODUODENOSCOPY N/A 06/26/2014   Procedure: ESOPHAGOGASTRODUODENOSCOPY (EGD);  Surgeon: Jeani Hawking, MD;  Location: [facility];  Service: Endoscopy;  Laterality: N/A;   EUS N/A 06/26/2014   Procedure: FULL UPPER ENDOSCOPIC ULTRASOUND (EUS) RADIAL;  Surgeon: Jeani Hawking, MD;  Location: [facility];  Service: Endoscopy;  Laterality: N/A;   IR GENERIC HISTORICAL  10/12/2015   IR ANGIO VERTEBRAL SEL VERTEBRAL BILAT MOD SED 10/12/2015 Julieanne Cotton, MD [facility]   IR GENERIC HISTORICAL  10/12/2015   IR ANGIO INTRA EXTRACRAN SEL COM CAROTID INNOMINATE BILAT MOD SED 10/12/2015 Julieanne Cotton, MD [facility]   RADIOLOGY WITH ANESTHESIA N/A 05/23/2014   Procedure: RADIOLOGY WITH ANESTHESIA;  Surgeon: Medication Radiologist, MD;  Location: [facility] OR;  Service: Radiology;  Laterality: N/A;   RESECTION DISTAL CLAVICAL Right 12/13/2012   Procedure: RESECTION DISTAL CLAVICAL;  Surgeon: Vickki Hearing, MD;  Location: [facility] OR[facility];  Service: Orthopedics;  Laterality: Right;   SHOULDER ARTHROSCOPY WITH BICEPSTENOTOMY Right 12/13/2012   Procedure: SHOULDER ARTHROSCOPY WITH BICEPS TENOTOMY, limited shoulder debridement;  Surgeon: Vickki Hearing, MD;  Location: [facility];  Service: Orthopedics;  Laterality: Right;   SHOULDER SURGERY Right    torn cartiledge   SHOULDER SURGERY     right shoulder   TRANSURETHRAL RESECTION OF PROSTATE N/A 07/09/2014   Procedure: TRANSURETHRAL RESECTION of prostate WITH BUTTON ;  Surgeon: Bjorn Pippin, MD;  Location: [facility];  Service: Urology;  Laterality: N/A;    FAMILY HISTORY: The patient's family history includes Thyroid disease in his mother.   SOCIAL HISTORY:  The patient  reports that he quit smoking about 38 years ago. His smoking use included cigarettes. He has a 17.50  pack-year smoking history. He has never used smokeless tobacco. He reports current alcohol use of about 3.0 standard drinks of alcohol per week. He reports that he does not use drugs.  Review of Systems  Cardiovascular:  Negative for chest pain, dyspnea on exertion, leg swelling, near-syncope, orthopnea, palpitations, paroxysmal nocturnal dyspnea and syncope.  Respiratory:  Negative for shortness of breath.     PHYSICAL EXAM:    06/30/2022    9:57 AM 02/21/2022    9:48 AM 09/01/2021    9:33 AM  Vitals with BMI  Height 5\' 7"     Weight 241 lbs 13 oz    BMI 37.86    Systolic 142 117 161  Diastolic 76 66 67  Pulse 72 77 72    Physical Exam  Constitutional: No distress.  Age appropriate, hemodynamically stable.   Neck: No JVD present.  Cardiovascular: Normal rate, regular rhythm, S1 normal, S2 normal, intact distal  pulses and normal pulses. Exam reveals no gallop, no S3 and no S4.  Murmur heard. Midsystolic murmur is present with a grade of 3/6 at the upper right sternal border. Pulses:      Carotid pulses are  on the right side with bruit. Pulmonary/Chest: Effort normal and breath sounds normal. No stridor. He has no wheezes. He has no rales.  Abdominal: Soft. Bowel sounds are normal. He exhibits no distension. There is no abdominal tenderness.  Musculoskeletal:        General: No edema.     Cervical back: Neck supple.  Neurological: He is alert and oriented to person, place, and time. He has intact cranial nerves (2-12).  Skin: Skin is warm and moist.   CARDIAC DATABASE: EKG: Jun 30, 2022: Normal sinus rhythm, 66 bpm, without underlying ischemia or injury pattern  Echocardiogram: 06/23/2021:  Poor echo window Normal LV systolic function with EF 58%. Left ventricle cavity is normal in size. Mild concentric remodeling of the left ventricle. Mild basal septal hypertrophy. Normal global wall motion. Normal diastolic filling pattern, normal LAP. Calculated EF 58%. Left atrial  cavity is mildly dilated at 4.5 cm. Mild calcification of the aortic valve annulus. No AV stenosis. Peak velocity 1.97 m/s, Peak Pressure Gradient 15.5 mmHg, Mean Gradient 8.1 mmHg, AVA 1.7 cm, Dimensionless Index >0.25. Elevated gradients could be due to mild basal septal hypertrophy and LVOT obstruction. Structurally normal mitral valve. Mild (Grade I) mitral regurgitation. Structurally normal pulmonic valve. Mild pulmonic regurgitation. No significant change since 12/11/2018 and 05/25/2016.   Event Monitor 30 days 02/19/2015: NSR, No symptoms reported. No A. Fib or heart block.   Lexiscan myoview stress test 02/26/2015: 1. The resting electrocardiogram demonstrated normal sinus rhythm, incomplete RBBB and no resting arrhythmias.  Stress EKG is non-diagnostic for ischemia as it a pharmacologic stress using Lexiscan.Stress symptoms included dyspnea. 2. The perfusion imaging study demonstrates a very small sized inferolateral and inferoapical subtle ischemia.  LVEF was normal without regional wall motion at 77%.  This represents a low risk study.  Carotid duplex 06/23/2021 Right Carotid: Velocities in the right ICA are consistent with a 40-59% stenosis. The ECA appears >50% stenosed.  Left Carotid: The ECA appears >50% stenosed. ICA stent shows <50% stenosis. Vertebrals: Bilateral vertebral arteries demonstrate antegrade flow. Subclavians: Normal flow hemodynamics were seen in bilateral subclavian arteries.  LABORATORY DATA: External Labs: Collected: 01/07/2020 performed at [facility] Creatinine 1.0 mg/dL. eGFR: 70 mL/min per 1.73 m Sodium 130, potassium 4.9, chloride 94, bicarb 23 Hemoglobin 14.3 g/dL and hematocrit 16% Lipid profile: Total cholesterol 132, triglycerides 52, HDL 63, LDL 59, non-HDL 69  External Labs: Collected: 02/24/2021 Total cholesterol 134, triglycerides 64, HDL 47, LDL 74, non-HDL 87 Apolipoprotein B 62 (within normal limits) BUN 13, creatinine  1. eGFR 71.2. Sodium 132, potassium 4.9, chloride 100, bicarb 25. AST 21, ALT 22, alkaline phosphatase 54  External Labs: Collected: 04/06/2022 provided by the patient's wife.  Total cholesterol 139, triglycerides 56, HDL 53, LDL 75, non-HDL 86. BUN 18, creatinine 1.2. eGFR 57.5. Sodium 138, potassium 5, chloride 103, bicarb 27. AST 18, ALT 16, alkaline phosphatase 40. Hemoglobin 13.3, hematocrit 38.1%   FINAL MEDICATION LIST END OF ENCOUNTER: Meds ordered this encounter  Medications   DISCONTD: Bempedoic Acid-Ezetimibe (NEXLIZET) 180-10 MG TABS    Sig: Take 1 tablet by mouth daily.    Dispense:  90 tablet    Refill:  0    Medications Discontinued During This Encounter  Medication Reason  azithromycin (ZITHROMAX) 250 MG tablet    benzonatate (TESSALON PERLES) 100 MG capsule    carbamide peroxide (DEBROX) 6.5 % OTIC solution    HYDROcodone bit-homatropine (HYCODAN) 5-1.5 MG/5ML syrup    predniSONE (STERAPRED UNI-PAK 48 TAB) 10 MG (48) TBPK tablet    triamcinolone cream (KENALOG) 0.1 %       Current Outpatient Medications:    ALPRAZolam (XANAX) 0.25 MG tablet, Take 0.25 mg by mouth at bedtime as needed., Disp: , Rfl:    aspirin EC 81 MG tablet, Take 81 mg by mouth daily., Disp: , Rfl:    atorvastatin (LIPITOR) 40 MG tablet, Take 1 tablet (40 mg total) by mouth every evening., Disp: 30 tablet, Rfl: 1   Cholecalciferol (VITAMIN D PO), Take 1 capsule by mouth every evening., Disp: , Rfl:    clopidogrel (PLAVIX) 75 MG tablet, TAKE 1 TABLET DAILY (Patient taking differently: Take 75 mg by mouth every morning.), Disp: 90 tablet, Rfl: 3   finasteride (PROSCAR) 5 MG tablet, TAKE 1 TABLET DAILY, Disp: 100 tablet, Rfl: 3   fluticasone (FLONASE) 50 MCG/ACT nasal spray, Place 2 sprays into both nostrils daily., Disp: 16 g, Rfl: 0   olmesartan (BENICAR) 40 MG tablet, TAKE 1 TABLET DAILY, Disp: 90 tablet, Rfl: 3   pantoprazole (PROTONIX) 40 MG tablet, Take 40 mg by mouth daily., Disp: ,  Rfl:    polyethylene glycol (MIRALAX / GLYCOLAX) packet, Take 17 g by mouth every morning., Disp: , Rfl:    spironolactone (ALDACTONE) 25 MG tablet, Take 25 mg by mouth daily., Disp: , Rfl:    Bempedoic Acid-Ezetimibe (NEXLIZET) 180-10 MG TABS, Take 1 tablet by mouth daily., Disp: 90 tablet, Rfl: 0  IMPRESSION:    ICD-10-CM   1. Atherosclerosis of both carotid arteries  I65.23 EKG 12-Lead    Lipid Panel With LDL/HDL Ratio    LDL cholesterol, direct    CMP14+EGFR    DISCONTINUED: Bempedoic Acid-Ezetimibe (NEXLIZET) 180-10 MG TABS    2. Presence of internal carotid stent  Z95.828 Lipid Panel With LDL/HDL Ratio    LDL cholesterol, direct    CMP14+EGFR    DISCONTINUED: Bempedoic Acid-Ezetimibe (NEXLIZET) 180-10 MG TABS    3. Hx of stroke  Z86.73 Lipid Panel With LDL/HDL Ratio    LDL cholesterol, direct    CMP14+EGFR    DISCONTINUED: Bempedoic Acid-Ezetimibe (NEXLIZET) 180-10 MG TABS    4. Benign hypertension  I10     5. Mixed hyperlipidemia  E78.2 Lipid Panel With LDL/HDL Ratio    LDL cholesterol, direct    CMP14+EGFR    DISCONTINUED: Bempedoic Acid-Ezetimibe (NEXLIZET) 180-10 MG TABS    6. Former smoker  Z87.891     7. Class 2 severe obesity due to excess calories with serious comorbidity and body mass index (BMI) of 37.0 to 37.9 in adult [facility])  E66.01    Z68.37        RECOMMENDATIONS: Henry [name] is a 89 y.o. male whose past medical history and cardiovascular risk factors include: Hyperlipidemia, hypertension, hyperglycemia, history of carotid artery disease status post LICA stenting, history of CVA in 2016, history of syncope, advanced age, obesity due to excess calories.  Atherosclerosis of both carotid arteries Presence of internal carotid stent Hx of stroke His annual carotid duplex which is scheduled in May 2024 still pending. Recommended that he proceeds forward I will update him with results when available Continue aspirin and statin therapy.  Despite being on  the current statin dose his LDL levels are  consistently not at goal. Will start Nexlizet 180/10 mg p.o. daily with labs in 6 weeks to reevaluate therapy.  Medication profile discussed with patient and wife. Further recommendations to follow  Benign hypertension Office blood pressures are not at goal. Patient states that home blood pressure better controlled; however, I do not have a log of his blood pressures to review. Medications reconciled Currently managed by primary care provider.  Mixed hyperlipidemia Currently on atorvastatin.   He denies myalgia or other side effects. Most recent lipids dated February 2024, independently reviewed as noted above. Medication changes as discussed above  Class 2 severe obesity due to excess calories with serious comorbidity and body mass index (BMI) of 37.0 to 37.9 in adult [facility]) Body mass index is 37.87 kg/m. I reviewed with him importance of diet, regular physical activity/exercise, weight loss.   Patient is educated on the importance of increasing physical activity gradually as tolerated with a goal of moderate intensity exercise for 30 minutes a day 5 days a week. Patient's wife states that they are in the process of getting semaglutide or Mounjaro approved given his comorbidities help facilitate weight loss  Orders Placed This Encounter  Procedures   Lipid Panel With LDL/HDL Ratio   LDL cholesterol, direct   [id number]+[id number]   EKG 12-Lead    --Continue cardiac medications as reconciled in final medication list. --Return in about 1 year (around 06/30/2023) for Follow up, Carotid disease. Or sooner if needed. --Continue follow-up with your primary care physician regarding the management of your other chronic comorbid conditions.  Patient's questions and concerns were addressed to his satisfaction. He voices understanding of the instructions provided during this encounter.   This note was created using a voice recognition software as a result  there may be grammatical errors inadvertently enclosed that do not reflect the nature of this encounter. Every attempt is made to correct such errors.  Tessa Lerner, [place], [facility]  Pager:  [phone] Office: [phone]

## 2022-07-03 ENCOUNTER — Telehealth: Payer: Self-pay

## 2022-07-03 DIAGNOSIS — I6523 Occlusion and stenosis of bilateral carotid arteries: Secondary | ICD-10-CM

## 2022-07-03 DIAGNOSIS — Z95828 Presence of other vascular implants and grafts: Secondary | ICD-10-CM

## 2022-07-03 DIAGNOSIS — E782 Mixed hyperlipidemia: Secondary | ICD-10-CM

## 2022-07-03 DIAGNOSIS — Z8673 Personal history of transient ischemic attack (TIA), and cerebral infarction without residual deficits: Secondary | ICD-10-CM

## 2022-07-03 NOTE — Telephone Encounter (Signed)
Can we do a PA?   Henry Melle Washington, DO, [facility]

## 2022-07-03 NOTE — Telephone Encounter (Signed)
Nexlizet was denied for the following reason... Coverage is provided in situations where the patient is currently taking ezetimibe. Coverage cannot be authorized at this time.

## 2022-07-05 NOTE — Telephone Encounter (Signed)
It was denied.

## 2022-07-06 MED ORDER — NEXLIZET 180-10 MG PO TABS: 1.0000 | ORAL_TABLET | Freq: Every day | ORAL | 3 refills | Status: DC

## 2022-07-06 NOTE — Telephone Encounter (Signed)
Okay to send to [place].   ST

## 2022-07-10 ENCOUNTER — Ambulatory Visit (HOSPITAL_COMMUNITY): Payer: Medicare Other

## 2022-07-11 ENCOUNTER — Ambulatory Visit (HOSPITAL_COMMUNITY)
Admission: RE | Admit: 2022-07-11 | Discharge: 2022-07-11 | Disposition: A | Discharge status: Home / Self Care | Payer: Medicare Other | Source: Ambulatory Visit | Attending: Cardiology | Admitting: Cardiology

## 2022-07-11 DIAGNOSIS — E785 Hyperlipidemia, unspecified: Secondary | ICD-10-CM | POA: Diagnosis not present

## 2022-07-11 DIAGNOSIS — I2510 Atherosclerotic heart disease of native coronary artery without angina pectoris: Secondary | ICD-10-CM | POA: Insufficient documentation

## 2022-07-11 DIAGNOSIS — I6523 Occlusion and stenosis of bilateral carotid arteries: Secondary | ICD-10-CM

## 2022-07-11 DIAGNOSIS — Z8673 Personal history of transient ischemic attack (TIA), and cerebral infarction without residual deficits: Secondary | ICD-10-CM

## 2022-07-11 DIAGNOSIS — I10 Essential (primary) hypertension: Secondary | ICD-10-CM | POA: Diagnosis not present

## 2022-07-11 NOTE — Progress Notes (Signed)
Bilateral carotid ultrasound study completed.   Please see CV Procedures for preliminary results.  [place] Dube, RVT  2:32 PM 07/11/22

## 2022-07-16 ENCOUNTER — Other Ambulatory Visit: Payer: Self-pay | Admitting: Cardiology

## 2022-07-16 DIAGNOSIS — Z95828 Presence of other vascular implants and grafts: Secondary | ICD-10-CM

## 2022-07-16 DIAGNOSIS — I6523 Occlusion and stenosis of bilateral carotid arteries: Secondary | ICD-10-CM

## 2022-07-16 DIAGNOSIS — Z8673 Personal history of transient ischemic attack (TIA), and cerebral infarction without residual deficits: Secondary | ICD-10-CM

## 2022-07-18 ENCOUNTER — Telehealth: Payer: Self-pay

## 2022-07-18 ENCOUNTER — Other Ambulatory Visit: Payer: Self-pay

## 2022-07-18 DIAGNOSIS — E782 Mixed hyperlipidemia: Secondary | ICD-10-CM

## 2022-07-18 DIAGNOSIS — Z8673 Personal history of transient ischemic attack (TIA), and cerebral infarction without residual deficits: Secondary | ICD-10-CM

## 2022-07-18 DIAGNOSIS — Z95828 Presence of other vascular implants and grafts: Secondary | ICD-10-CM

## 2022-07-18 DIAGNOSIS — I6523 Occlusion and stenosis of bilateral carotid arteries: Secondary | ICD-10-CM

## 2022-07-18 MED ORDER — NEXLIZET 180-10 MG PO TABS: 1.0000 | ORAL_TABLET | Freq: Every day | ORAL | 3 refills | Status: AC

## 2022-07-18 NOTE — Telephone Encounter (Signed)
Can we do an appeal given his comorbid conditions? Talk to rep - consider [facility] etc   Tessa Lerner, DO, [facility]

## 2022-07-18 NOTE — Telephone Encounter (Signed)
Patient is not able to get nexlizet through the [facility] , they are asking for an alternative

## 2022-07-18 NOTE — Telephone Encounter (Signed)
Sent to [employer]

## 2022-08-07 ENCOUNTER — Encounter: Payer: Self-pay | Admitting: *Deleted

## 2022-08-08 ENCOUNTER — Telehealth (INDEPENDENT_AMBULATORY_CARE_PROVIDER_SITE_OTHER): Payer: Medicare Other | Admitting: Adult Health

## 2022-08-08 DIAGNOSIS — G4733 Obstructive sleep apnea (adult) (pediatric): Secondary | ICD-10-CM

## 2022-09-15 DIAGNOSIS — M9902 Segmental and somatic dysfunction of thoracic region: Secondary | ICD-10-CM | POA: Diagnosis not present

## 2022-09-15 DIAGNOSIS — M9903 Segmental and somatic dysfunction of lumbar region: Secondary | ICD-10-CM | POA: Diagnosis not present

## 2022-09-15 DIAGNOSIS — M62830 Muscle spasm of back: Secondary | ICD-10-CM | POA: Diagnosis not present

## 2022-09-15 DIAGNOSIS — M9905 Segmental and somatic dysfunction of pelvic region: Secondary | ICD-10-CM | POA: Diagnosis not present

## 2022-09-19 DIAGNOSIS — G8929 Other chronic pain: Secondary | ICD-10-CM | POA: Diagnosis not present

## 2022-09-19 DIAGNOSIS — R42 Dizziness and giddiness: Secondary | ICD-10-CM | POA: Diagnosis not present

## 2022-09-19 DIAGNOSIS — M545 Low back pain, unspecified: Secondary | ICD-10-CM | POA: Diagnosis not present

## 2022-09-19 DIAGNOSIS — I6522 Occlusion and stenosis of left carotid artery: Secondary | ICD-10-CM | POA: Diagnosis not present

## 2022-09-19 DIAGNOSIS — I10 Essential (primary) hypertension: Secondary | ICD-10-CM | POA: Diagnosis not present

## 2022-09-19 DIAGNOSIS — Z95828 Presence of other vascular implants and grafts: Secondary | ICD-10-CM | POA: Diagnosis not present

## 2022-09-19 DIAGNOSIS — G4733 Obstructive sleep apnea (adult) (pediatric): Secondary | ICD-10-CM | POA: Diagnosis not present

## 2022-11-20 ENCOUNTER — Other Ambulatory Visit: Payer: Self-pay | Admitting: Cardiology

## 2022-11-28 DIAGNOSIS — Z23 Encounter for immunization: Secondary | ICD-10-CM | POA: Diagnosis not present

## 2022-12-27 ENCOUNTER — Telehealth: Payer: Self-pay | Admitting: Adult Health

## 2022-12-27 NOTE — Telephone Encounter (Signed)
LVM and sent mychart msg informing pt of need to reschedule 08/07/23 appt - NP out

## 2023-01-01 ENCOUNTER — Telehealth: Payer: Self-pay | Admitting: Adult Health

## 2023-01-01 DIAGNOSIS — G4733 Obstructive sleep apnea (adult) (pediatric): Secondary | ICD-10-CM

## 2023-01-01 NOTE — Telephone Encounter (Signed)
Order signed

## 2023-01-01 NOTE — Telephone Encounter (Signed)
I called pt's wife back and LVM (ok per DPR) asking for call back to let us know if they are ok with sleep study being ordered in order to get new machine.  Also sent mychart message they can respond to instead.

## 2023-01-01 NOTE — Addendum Note (Signed)
Addended by: Bertram Savin on: 01/01/2023 03:36 PM   Modules accepted: Orders

## 2023-01-01 NOTE — Addendum Note (Signed)
Addended by: Enedina Finner on: 01/01/2023 04:03 PM   Modules accepted: Orders

## 2023-01-01 NOTE — Telephone Encounter (Signed)
Last visit was in June. He has been compliant. Will need SS before new machine ordered.

## 2023-01-01 NOTE — Telephone Encounter (Signed)
Wife is calling to report that pt's CPAP(which is over 86 years old) needs replacing.  Wife states on screen it says needs replacing.  Wife states the CPAP cuts in and out, the DME advised she calls the [facility] to report.

## 2023-01-09 DIAGNOSIS — M9903 Segmental and somatic dysfunction of lumbar region: Secondary | ICD-10-CM | POA: Diagnosis not present

## 2023-01-09 DIAGNOSIS — M9905 Segmental and somatic dysfunction of pelvic region: Secondary | ICD-10-CM | POA: Diagnosis not present

## 2023-01-09 DIAGNOSIS — M9902 Segmental and somatic dysfunction of thoracic region: Secondary | ICD-10-CM | POA: Diagnosis not present

## 2023-01-09 DIAGNOSIS — M62830 Muscle spasm of back: Secondary | ICD-10-CM | POA: Diagnosis not present

## 2023-01-09 DIAGNOSIS — M546 Pain in thoracic spine: Secondary | ICD-10-CM | POA: Diagnosis not present

## 2023-01-15 ENCOUNTER — Encounter: Payer: Self-pay | Admitting: Adult Health

## 2023-01-15 NOTE — Telephone Encounter (Signed)
Error

## 2023-02-27 ENCOUNTER — Ambulatory Visit: Payer: Medicare Other | Admitting: Neurology

## 2023-02-27 DIAGNOSIS — G4733 Obstructive sleep apnea (adult) (pediatric): Secondary | ICD-10-CM

## 2023-02-28 NOTE — Progress Notes (Signed)
 See procedure note.

## 2023-03-02 ENCOUNTER — Other Ambulatory Visit: Payer: Self-pay | Admitting: Adult Health

## 2023-03-02 ENCOUNTER — Encounter: Payer: Self-pay | Admitting: Adult Health

## 2023-03-02 DIAGNOSIS — G4733 Obstructive sleep apnea (adult) (pediatric): Secondary | ICD-10-CM

## 2023-03-02 NOTE — Procedures (Signed)
 [facility]  HOME SLEEP TEST (Watch PAT) REPORT  STUDY DATE: 02/27/23  DOB: [birth year]  MRN: [id number]  ORDERING CLINICIAN: True Mar, MD, PhD   REFERRING CLINICIAN: Duwaine Russell, NP  CLINICAL INFORMATION/HISTORY: 89 year old right-handed gentleman with an underlying medical history of bilateral carotid artery stenosis, left MCA stroke in April 2016 with status post endovascular re-vascularization (s/p stent placement), history of syncope, orthostatic hypotension, hypertension, hyperlipidemia, vitamin D deficiency, and obesity, who presents for re-evaluation of his obstructive sleep apnea, established on treatment with CPAP of 9 cm with EPR of 3 with good apnea, good tolerance reported and ongoing excellent compliance. He should be eligible for a new machine.   Last documented BMI: 37.9 kg/m  FINDINGS:   Sleep Summary:   Total Recording Time (hours, min): 7 hours, 46 min  Total Sleep Time (hours, min):  6 hours, 39 min  Percent REM (%):    22.6%   Respiratory Indices:   Calculated pAHI (per hour):  42.8/hour         REM pAHI:    39.9/hour       NREM pAHI: 43.7/hour  Central pAHI: 0.5/hour  Oxygen  Saturation Statistics:    Oxygen  Saturation (%) Mean: 93%   Minimum oxygen  saturation (%):                 77%   O2 Saturation Range (%): 77 - 99%    O2 Saturation (minutes) <=88%: 6 min  Pulse Rate Statistics:   Pulse Mean (bpm):    62/min    Pulse Range (41-109/min)   IMPRESSION: OSA (obstructive sleep apnea), severe   RECOMMENDATION:  This home sleep test demonstrates severe obstructive sleep apnea with a total AHI of 42.8/hour and O2 nadir of 77% with time below or at 88% saturation of over 5 minutes for the study.  Snoring was in the moderate range fairly consistently throughout the study, at times milder, at times louder.  Ongoing treatment with positive airway pressure is highly recommended. The patient has been compliant with his  CPAP of 9 cm with EPR of 3 with good apnea control and good tolerance reported.  I recommend continuing with these pressure settings and prescribing a new machine as he should be eligible for new equipment.  Ongoing full compliance should be reinforced. A laboratory attended titration study can be considered in the future for optimization of treatment settings and to improve tolerance and compliance, if needed, down the road. Alternative treatment options are limited secondary to the severity of the patient's sleep disordered breathing, but may include surgical treatment with an implantable hypoglossal nerve stimulator (in carefully selected candidates, meeting criteria).  Concomitant weight loss is recommended (where clinically appropriate). Please note, that untreated obstructive sleep apnea may carry additional perioperative morbidity. Patients with significant obstructive sleep apnea should receive perioperative PAP therapy and the surgeons and particularly the anesthesiologist should be informed of the diagnosis and the severity of the sleep disordered breathing. The patient should be cautioned not to drive, work at heights, or operate dangerous or heavy equipment when tired or sleepy. Review and reiteration of good sleep hygiene measures should be pursued with any patient. Other causes of the patient's symptoms, including circadian rhythm disturbances, an underlying mood disorder, medication effect and/or an underlying medical problem cannot be ruled out based on this test. Clinical correlation is recommended.  The patient and his referring provider will be notified of the test results. The patient will be seen in follow up in sleep  clinic at [facility]  I certify that I have reviewed the raw data recording prior to the issuance of this report in accordance with the standards of the American Academy of Sleep Medicine (AASM).    INTERPRETING PHYSICIAN:   True Mar, MD, PhD Medical Director, [facility]) Diplomat, ABPN (Neurology and Sleep)   [facility] [address], [address] [place], [place] [address] [phone]

## 2023-04-27 DIAGNOSIS — N400 Enlarged prostate without lower urinary tract symptoms: Secondary | ICD-10-CM | POA: Diagnosis not present

## 2023-04-27 DIAGNOSIS — I5032 Chronic diastolic (congestive) heart failure: Secondary | ICD-10-CM | POA: Diagnosis not present

## 2023-04-27 DIAGNOSIS — I130 Hypertensive heart and chronic kidney disease with heart failure and stage 1 through stage 4 chronic kidney disease, or unspecified chronic kidney disease: Secondary | ICD-10-CM | POA: Diagnosis not present

## 2023-04-27 DIAGNOSIS — E785 Hyperlipidemia, unspecified: Secondary | ICD-10-CM | POA: Diagnosis not present

## 2023-04-27 DIAGNOSIS — N1831 Chronic kidney disease, stage 3a: Secondary | ICD-10-CM | POA: Diagnosis not present

## 2023-05-04 DIAGNOSIS — G4733 Obstructive sleep apnea (adult) (pediatric): Secondary | ICD-10-CM | POA: Diagnosis not present

## 2023-05-04 DIAGNOSIS — M48062 Spinal stenosis, lumbar region with neurogenic claudication: Secondary | ICD-10-CM | POA: Diagnosis not present

## 2023-05-04 DIAGNOSIS — E785 Hyperlipidemia, unspecified: Secondary | ICD-10-CM | POA: Diagnosis not present

## 2023-05-04 DIAGNOSIS — Z1339 Encounter for screening examination for other mental health and behavioral disorders: Secondary | ICD-10-CM | POA: Diagnosis not present

## 2023-05-04 DIAGNOSIS — Z95828 Presence of other vascular implants and grafts: Secondary | ICD-10-CM | POA: Diagnosis not present

## 2023-05-04 DIAGNOSIS — I6930 Unspecified sequelae of cerebral infarction: Secondary | ICD-10-CM | POA: Diagnosis not present

## 2023-05-04 DIAGNOSIS — R82998 Other abnormal findings in urine: Secondary | ICD-10-CM | POA: Diagnosis not present

## 2023-05-04 DIAGNOSIS — Z1331 Encounter for screening for depression: Secondary | ICD-10-CM | POA: Diagnosis not present

## 2023-05-04 DIAGNOSIS — N1831 Chronic kidney disease, stage 3a: Secondary | ICD-10-CM | POA: Diagnosis not present

## 2023-05-04 DIAGNOSIS — Z0000 Encounter for general adult medical examination without abnormal findings: Secondary | ICD-10-CM | POA: Diagnosis not present

## 2023-05-04 DIAGNOSIS — I6522 Occlusion and stenosis of left carotid artery: Secondary | ICD-10-CM | POA: Diagnosis not present

## 2023-05-04 DIAGNOSIS — I129 Hypertensive chronic kidney disease with stage 1 through stage 4 chronic kidney disease, or unspecified chronic kidney disease: Secondary | ICD-10-CM | POA: Diagnosis not present

## 2023-05-14 DIAGNOSIS — Z23 Encounter for immunization: Secondary | ICD-10-CM | POA: Diagnosis not present

## 2023-05-18 ENCOUNTER — Other Ambulatory Visit: Payer: Self-pay | Admitting: Cardiology

## 2023-06-29 ENCOUNTER — Ambulatory Visit: Payer: Self-pay | Admitting: Cardiology

## 2023-07-12 ENCOUNTER — Ambulatory Visit (HOSPITAL_COMMUNITY)
Admission: RE | Admit: 2023-07-12 | Discharge: 2023-07-12 | Disposition: A | Discharge status: Home / Self Care | Source: Ambulatory Visit | Attending: Cardiology | Admitting: Cardiology

## 2023-07-12 DIAGNOSIS — Z8673 Personal history of transient ischemic attack (TIA), and cerebral infarction without residual deficits: Secondary | ICD-10-CM | POA: Diagnosis not present

## 2023-07-12 DIAGNOSIS — Z95828 Presence of other vascular implants and grafts: Secondary | ICD-10-CM | POA: Diagnosis not present

## 2023-07-12 DIAGNOSIS — I6523 Occlusion and stenosis of bilateral carotid arteries: Secondary | ICD-10-CM | POA: Diagnosis not present

## 2023-07-16 ENCOUNTER — Ambulatory Visit: Payer: Self-pay | Admitting: Cardiology

## 2023-07-16 DIAGNOSIS — I739 Peripheral vascular disease, unspecified: Secondary | ICD-10-CM

## 2023-07-16 DIAGNOSIS — I779 Disorder of arteries and arterioles, unspecified: Secondary | ICD-10-CM

## 2023-07-19 ENCOUNTER — Telehealth: Payer: Self-pay | Admitting: Cardiology

## 2023-07-19 NOTE — Telephone Encounter (Signed)
 Pt and his wife advised and they will keep his 07/23/23 OV with Dr Albert Huff.

## 2023-07-19 NOTE — Telephone Encounter (Signed)
 Wife is returning call to discuss carotid results.

## 2023-07-23 ENCOUNTER — Encounter: Payer: Self-pay | Admitting: Cardiology

## 2023-07-23 ENCOUNTER — Ambulatory Visit: Attending: Internal Medicine | Admitting: Cardiology

## 2023-07-23 VITALS — BP 130/68 | HR 64 | Ht 67.0 in | Wt 244.0 lb

## 2023-07-23 DIAGNOSIS — I10 Essential (primary) hypertension: Secondary | ICD-10-CM | POA: Diagnosis not present

## 2023-07-23 DIAGNOSIS — E66812 Obesity, class 2: Secondary | ICD-10-CM

## 2023-07-23 DIAGNOSIS — E782 Mixed hyperlipidemia: Secondary | ICD-10-CM | POA: Diagnosis not present

## 2023-07-23 DIAGNOSIS — Z6838 Body mass index (BMI) 38.0-38.9, adult: Secondary | ICD-10-CM | POA: Diagnosis not present

## 2023-07-23 DIAGNOSIS — I739 Peripheral vascular disease, unspecified: Secondary | ICD-10-CM | POA: Diagnosis not present

## 2023-07-23 DIAGNOSIS — Z87891 Personal history of nicotine dependence: Secondary | ICD-10-CM

## 2023-07-23 DIAGNOSIS — Z8673 Personal history of transient ischemic attack (TIA), and cerebral infarction without residual deficits: Secondary | ICD-10-CM

## 2023-07-23 DIAGNOSIS — Z95828 Presence of other vascular implants and grafts: Secondary | ICD-10-CM | POA: Diagnosis not present

## 2023-07-23 DIAGNOSIS — I779 Disorder of arteries and arterioles, unspecified: Secondary | ICD-10-CM | POA: Diagnosis not present

## 2023-07-23 DIAGNOSIS — I6523 Occlusion and stenosis of bilateral carotid arteries: Secondary | ICD-10-CM

## 2023-07-23 DIAGNOSIS — R011 Cardiac murmur, unspecified: Secondary | ICD-10-CM

## 2023-07-23 NOTE — Progress Notes (Unsigned)
 [facility] [place]  ASSESSMENT / PLAN: 89 y.o. male with history of carotid artery stenting; right vertebral artery stenosis; likely bilateral subclavian artery stenosis.   Recommend:  Abstinence from all tobacco products. Blood glucose control with goal A1c < 7%. Blood pressure control with goal blood pressure < 130/80 mmHg. Lipid reduction therapy with goal LDL-C < 55 mg/dL. Aspirin  81mg  by mouth daily. Atorvastatin  40-80mg  PO QD (or other "high intensity" statin therapy).  Follow up in 1 year with carotid duplex  CHIEF COMPLAINT: abnormal carotid duplex  HISTORY OF PRESENT ILLNESS: Henry [name] is a 89 y.o. male referred to clinic for evaluation of abnormal findings on carotid artery duplex.  The patient reports a remote stroke in 2016.  He has no residual deficits.  He has no pain or dizziness with the use of his upper extremities.  He is not reporting claudication or rest pain symptoms in his upper extremities.  He has no ulcers about his fingers.  He has a history of carotid artery stenting by interventional radiology.  We reviewed his duplex in detail.  Past Medical History:  Diagnosis Date   Accelerated hypertension 06/29/2014   Arthritis    Back   Chronic kidney disease    Dizziness and giddiness    Enlarged prostate    Esophagitis 05/10/2011   Foley catheter in place    FOOT SPRAIN, RIGHT 07/18/2007   Qualifier: Diagnosis of  By: Phyllis Breeze MD, Arvel Lather     GERD (gastroesophageal reflux disease)    HOH (hard of hearing)    Hx-TIA (transient ischemic attack)    hx of this- wife states 10 years ago-but Dr. Queen Bruins told wife he had had several-has 2 stents in carotid artery-left   Hypercholesteremia    Hypertension    Low back pain    Nocturia    Shingles    Stroke [facility])    had a stroke 05/23/2014   Syncope and collapse     Past Surgical History:  Procedure Laterality Date   carotid stents  05/2014   2 stents in carotid artery on left side    COLONOSCOPY     COLONOSCOPY  05/17/2011   Procedure: COLONOSCOPY;  Surgeon: Ruby Corporal, MD;  [facility];  Service: Endoscopy;  Laterality: N/A;  830   epidural steroid injection  05/22/2014   had this in lumbar and at 1 am on 05/23/2014 had a stroke   ESOPHAGOGASTRODUODENOSCOPY  10/28/2010   Procedure: ESOPHAGOGASTRODUODENOSCOPY (EGD);  Surgeon: Ruby Corporal, MD;  [facility];  Service: Endoscopy;  Laterality: N/A;  10:30   ESOPHAGOGASTRODUODENOSCOPY N/A 04/24/2014   Procedure: ESOPHAGOGASTRODUODENOSCOPY (EGD);  Surgeon: Ruby Corporal, MD;  [facility];  Service: Endoscopy;  Laterality: N/A;  815   ESOPHAGOGASTRODUODENOSCOPY N/A 06/26/2014   Procedure: ESOPHAGOGASTRODUODENOSCOPY (EGD);  Surgeon: Alvis Jourdain, MD;  Location: [facility];  Service: Endoscopy;  Laterality: N/A;   EUS N/A 06/26/2014   Procedure: FULL UPPER ENDOSCOPIC ULTRASOUND (EUS) RADIAL;  Surgeon: Alvis Jourdain, MD;  Location: [facility];  Service: Endoscopy;  Laterality: N/A;   IR GENERIC HISTORICAL  10/12/2015   IR ANGIO VERTEBRAL SEL VERTEBRAL BILAT MOD SED 10/12/2015 Luellen Sages, MD [facility]   IR GENERIC HISTORICAL  10/12/2015   IR ANGIO INTRA EXTRACRAN SEL COM CAROTID INNOMINATE BILAT MOD SED 10/12/2015 Luellen Sages, MD [facility]   RADIOLOGY WITH ANESTHESIA N/A 05/23/2014   Procedure: RADIOLOGY WITH ANESTHESIA;  Surgeon: Medication Radiologist, MD;  Location:  [facility] OR;  Service: Radiology;  Laterality: N/A;   RESECTION DISTAL CLAVICAL Right 12/13/2012   Procedure: RESECTION DISTAL CLAVICAL;  Surgeon: Darrin Emerald, MD;  Location: [facility];  Service: Orthopedics;  Laterality: Right;   SHOULDER ARTHROSCOPY WITH BICEPSTENOTOMY Right 12/13/2012   Procedure: SHOULDER ARTHROSCOPY WITH BICEPS TENOTOMY, limited shoulder debridement;  Surgeon: Darrin Emerald, MD;  Location: [facility];  Service: Orthopedics;  Laterality: Right;   SHOULDER SURGERY Right    torn cartiledge    SHOULDER SURGERY     right shoulder   TRANSURETHRAL RESECTION OF PROSTATE N/A 07/09/2014   Procedure: TRANSURETHRAL RESECTION of prostate WITH BUTTON ;  Surgeon: Homero Luster, MD;  Location: [facility];  Service: Urology;  Laterality: N/A;    Family History  Problem Relation Age of Onset   Thyroid  disease Mother    Colon cancer Neg Hx     Social History   Socioeconomic History   Marital status: Married    Spouse name: Not on file   Number of children: 4   Years of education: 16   Highest education level: Not on file  Occupational History   Occupation: retired    Comment: [employer]  Tobacco Use   Smoking status: Former    Current packs/day: 0.00    Average packs/day: 0.5 packs/day for 35.0 years (17.5 ttl pk-yrs)    Types: Cigarettes    Start date: 62    Quit date: 1986    Years since quitting: 39.4   Smokeless tobacco: Never  Vaping Use   Vaping status: Never Used  Substance and Sexual Activity   Alcohol  use: Yes    Alcohol /week: 3.0 standard drinks of alcohol     Types: 3 Glasses of wine per week    Comment: 1 glass of wine every day   Drug use: No   Sexual activity: Yes    Birth control/protection: None  Other Topics Concern   Not on file  Social History Narrative   Married, retired - [employer]   Right handed   Caffeine use - 3-4 cups coffee daily   Social Drivers of Corporate investment banker Strain: Not on file  Food Insecurity: Not on file  Transportation Needs: Not on file  Physical Activity: Not on file  Stress: Not on file  Social Connections: Not on file  Intimate Partner Violence: Not on file    No Known Allergies  Current Outpatient Medications  Medication Sig Dispense Refill   ALPRAZolam  (XANAX ) 0.25 MG tablet Take 0.25 mg by mouth at bedtime as needed.     aspirin  EC 81 MG tablet Take 81 mg by mouth daily.     atorvastatin  (LIPITOR) 40 MG tablet Take 1 tablet (40 mg total) by mouth every evening. 30 tablet 1   Cholecalciferol (VITAMIN D  PO) Take 1 capsule by mouth every evening.     clopidogrel  (PLAVIX ) 75 MG tablet TAKE 1 TABLET DAILY (Patient taking differently: Take 75 mg by mouth every morning.) 90 tablet 3   finasteride  (PROSCAR ) 5 MG tablet TAKE 1 TABLET DAILY 100 tablet 3   olmesartan (BENICAR) 40 MG tablet TAKE 1 TABLET DAILY 90 tablet 0   pantoprazole  (PROTONIX ) 40 MG tablet Take 40 mg by mouth daily.     polyethylene glycol (MIRALAX / GLYCOLAX) packet Take 17 g by mouth every morning.     spironolactone (ALDACTONE) 25 MG tablet Take 25 mg by mouth daily.     No current facility-administered medications for this visit.  PHYSICAL EXAM Vitals:   07/24/23 0823 07/24/23 0825  BP: 137/81 (!) 149/71  Pulse: 65   Resp: 20   Temp: 97.9 F (36.6 C)   SpO2: 97%   Weight: 246 lb (111.6 kg)   Height: 5\' 7"  (1.702 m)     Elderly man in no acute distress Regular rate and rhythm Unlabored breathing Normal cranial nerve exam Normal gait and station No weakness in the extremities appreciated 2+ radial pulses bilaterally    PERTINENT LABORATORY AND RADIOLOGIC DATA  Most recent CBC    Latest Ref Rng & Units 11/03/2017    4:14 AM 11/02/2017    5:06 PM 10/12/2015    6:56 AM  CBC  WBC 4.0 - 10.5 K/uL 8.6  11.6  7.2   Hemoglobin 13.0 - 17.0 g/dL 91.4  78.2  95.6   Hematocrit 39.0 - 52.0 % 38.8  40.0  42.0   Platelets 150 - 400 K/uL 210  222  263      Most recent CMP    Latest Ref Rng & Units 11/03/2017    4:14 AM 11/02/2017    5:06 PM 10/12/2015    6:56 AM  CMP  Glucose 70 - 99 mg/dL 213  086  578   BUN 8 - 23 mg/dL 14  19  13    Creatinine 0.61 - 1.24 mg/dL 4.69  6.29  5.28   Sodium 135 - 145 mmol/L 135  132  139   Potassium 3.5 - 5.1 mmol/L 4.2  3.1  3.6   Chloride 98 - 111 mmol/L 104  99  105   CO2 22 - 32 mmol/L 26  26  26    Calcium  8.9 - 10.3 mg/dL 8.5  8.7  9.2   Total Protein 6.5 - 8.1 g/dL 6.2  6.9    Total Bilirubin 0.3 - 1.2 mg/dL 0.8  0.6    Alkaline Phos 38 - 126 U/L 35  37    AST 15 - 41  U/L 25  23    ALT 0 - 44 U/L 19  20      Renal function CrCl cannot be calculated (Patient's most recent lab result is older than the maximum 21 days allowed.).  Hgb A1c MFr Bld (%)  Date Value  05/24/2014 6.2 (H)    LDL Cholesterol  Date Value Ref Range Status  05/24/2014 59 0 - 99 mg/dL Final    Comment:           Total Cholesterol/HDL:CHD Risk Coronary Heart Disease Risk Table                     Men   Women  1/2 Average Risk   3.4   3.3  Average Risk       5.0   4.4  2 X Average Risk   9.6   7.1  3 X Average Risk  23.4   11.0        Use the calculated Patient Ratio above and the CHD Risk Table to determine the patient's CHD Risk.        ATP III CLASSIFICATION (LDL):  <100     mg/dL   Optimal  413-244  mg/dL   Near or Above                    Optimal  130-159  mg/dL   Borderline  010-272  mg/dL   High  >536     mg/dL   Very  High     Carotid duplex 07/12/23:  Right Carotid: Velocities in the right ICA are consistent with a 40-59%                 stenosis. Non-hemodynamically significant plaque <50% noted  in                the CCA. The ECA appears >50% stenosed. RICA stenosis based  on                peak systolic velocities and plaque formation.   Left         Widely patent mid CCA to distal ICA stent without evidence of  Carotid:     stenosis.   Vertebrals:  Left vertebral artery demonstrates antegrade flow. Atypical               antegrade flow in the right vertebral artery; near occlusion.  Subclavians: Bilateral subclavian artery flow was disturbed.   *See table(s) above for measurements and observations.  Suggest follow up study in 12 months.    Henry [name]. Edgardo Goodwill, MD FACS [facility] Phone Number: [phone] 07/24/2023 11:32 AM   Total time spent on preparing this encounter including chart review, data review, collecting history, examining the patient, and coordinating care: 45 minutes  Portions of this  report may have been transcribed using voice recognition software.  Every effort has been made to ensure accuracy; however, inadvertent computerized transcription errors may still be present.

## 2023-07-23 NOTE — Progress Notes (Signed)
 Cardiology Office Note:  .   Date:  07/23/2023  ID:  Lise Ridge, DOB [birth year], MRN [id number] PCP:  Bertha Broad, MD  Former Cardiology Providers: Zena High, APRN, FNP-C  [facility] Providers Cardiologist:  Olinda Bertrand, DO , [facility] (established care 06/19/2019) Electrophysiologist:  None  Click to update primary MD,subspecialty MD or APP then REFRESH:1}    Chief Complaint  Patient presents with   Follow-up    1 year follow up for carotid disease     History of Present Illness: .   Henry [name] is a 89 y.o. Caucasian male whose past medical history and cardiovascular risk factors includes: Hyperlipidemia, hypertension, hyperglycemia, Carotid artery disease status post stenting, history of CVA in 2016, history of syncope, advanced age, obesity due to excess calories.   Patient presents today for 1 year follow-up visit.  He underwent left ICA stenting in the past and has been undergoing surveillance carotid duplex to reevaluate disease progression.  Most recent carotid duplex from May 2024 illustrates right ICA disease 40-59%, left ICA stent patent with less than 50% stenosis.  Right vertebral artery demonstrates retrograde flow with possible near occlusion and bilateral subclavian artery flows are disturbed.   Patient is accompanied by his wife at today's office visit. Over the last 1 year patient denies anginal chest pain or heart failure symptoms.   Overall functional capacity remains relatively stable. Patient plans to undergo cataract surgery in the near future. Had a annual follow-up carotid duplex which notes disease progression in the right vertebral left clavian arteries bilaterally.  He was referred to vascular for further guidance given his carotid/vertebral/subclavian disease.  Pending appointment.  FUNCTIONAL STATUS: He walks 3 miles three times a week.   Review of Systems: .   ROS  Studies Reviewed:   EKG: EKG  Interpretation Date/Time:  Monday July 23 2023 09:07:32 EDT Ventricular Rate:  64 PR Interval:  182 QRS Duration:  82 QT Interval:  392 QTC Calculation: 404 R Axis:   -20  Text Interpretation: Sinus rhythm with Premature atrial complexes with Abberant conduction When compared with ECG of 02-Nov-2017 16:46, PREVIOUS ECG IS PRESENT Confirmed by Olinda Bertrand [phone]) on 07/23/2023 9:24:37 AM  Echocardiogram: 06/23/2021:  Poor echo window Normal LV systolic function with EF 58%. Left ventricle cavity is normal in size. Mild concentric remodeling of the left ventricle. Mild basal septal hypertrophy. Normal global wall motion. Normal diastolic filling pattern, normal LAP. Calculated EF 58%. Left atrial cavity is mildly dilated at 4.5 cm. Mild calcification of the aortic valve annulus. No AV stenosis. Peak velocity 1.97 m/s, Peak Pressure Gradient 15.5 mmHg, Mean Gradient 8.1 mmHg, AVA 1.7 cm, Dimensionless Index >0.25. Elevated gradients could be due to mild basal septal hypertrophy and LVOT obstruction. Structurally normal mitral valve. Mild (Grade I) mitral regurgitation. Structurally normal pulmonic valve. Mild pulmonic regurgitation. No significant change since 12/11/2018 and 05/25/2016.  Stress Testing: Lexiscan  myoview  stress test 02/26/2015: 1. The resting electrocardiogram demonstrated normal sinus rhythm, incomplete RBBB and no resting arrhythmias.  Stress EKG is non-diagnostic for ischemia as it a pharmacologic stress using Lexiscan .Stress symptoms included dyspnea. 2. The perfusion imaging study demonstrates a very small sized inferolateral and inferoapical subtle ischemia.  LVEF was normal without regional wall motion at 77%.  This represents a low risk study.  Carotid duplex 07/12/2022 Right Carotid: Velocities in the right ICA are consistent with a 40-59%  stenosis. The ECA appears >50% stenosed.   Left Carotid: The ECA appears >50% stenosed. The left ICA stent shows  <50%               stenosis.   Vertebrals:  Left vertebral artery demonstrates antegrade flow. Right vertebral              artery demonstrates retrograde flow. Subclavians: Left subclavian artery was stenotic. Normal flow hemodynamics were              seen in the right subclavian artery.  07/12/2023 Right Carotid: Velocities in the right ICA are consistent with a 40-59%                stenosis. Non-hemodynamically significant plaque <50% noted in                the CCA. The ECA appears >50% stenosed. RICA stenosis based on                peak systolic velocities and plaque formation.   Left         Widely patent mid CCA to distal ICA stent without evidence of Carotid:     stenosis.   Vertebrals:  Left vertebral artery demonstrates antegrade flow. Atypical              antegrade flow in the right vertebral artery; near occlusion. Subclavians: Bilateral subclavian artery flow was disturbed.  RADIOLOGY: NA  Risk Assessment/Calculations:   NA   Labs:    LABORATORY DATA: External Labs: Collected: 01/07/2020 performed at [facility] Creatinine 1.0 mg/dL. eGFR: 70 mL/min per 1.73 m Sodium 130, potassium 4.9, chloride 94, bicarb 23 Hemoglobin 14.3 g/dL and hematocrit 16% Lipid profile: Total cholesterol 132, triglycerides 52, HDL 63, LDL 59, non-HDL 69   External Labs: Collected: 02/24/2021 Total cholesterol 134, triglycerides 64, HDL 47, LDL 74, non-HDL 87 Apolipoprotein B 62 (within normal limits) BUN 13, creatinine 1. eGFR 71.2. Sodium 132, potassium 4.9, chloride 100, bicarb 25. AST 21, ALT 22, alkaline phosphatase 54   External Labs: Collected: 04/06/2022 provided by the patient's wife.   Total cholesterol 139, triglycerides 56, HDL 53, LDL 75, non-HDL 86. BUN 18, creatinine 1.2. eGFR 57.5. Sodium 138, potassium 5, chloride 103, bicarb 27. AST 18, ALT 16, alkaline phosphatase 40. Hemoglobin 13.3, hematocrit 38.1%  Physical Exam:   Today's  Vitals   07/23/23 0904  BP: 130/68  Pulse: 64  SpO2: 98%  Weight: 244 lb (110.7 kg)  Height: 5\' 7"  (1.702 m)   Body mass index is 38.22 kg/m. Wt Readings from Last 3 Encounters:  07/23/23 244 lb (110.7 kg)  06/30/22 241 lb 12.8 oz (109.7 kg)  08/08/21 234 lb 9.6 oz (106.4 kg)   Physical Exam  Constitutional: No distress.  hemodynamically stable  Neck: No JVD present.  Cardiovascular: Normal rate, regular rhythm, S1 normal and S2 normal. Exam reveals no gallop, no S3 and no S4.  No murmur heard. Pulses:      Carotid pulses are 2+ on the right side with bruit and 2+ on the left side. No subclavian bruit appreciated.   Pulmonary/Chest: Effort normal and breath sounds normal. No stridor. He has no wheezes. He has no rales.  Musculoskeletal:        General: No edema.     Cervical back: Neck supple.  Skin: Skin is warm.    Impression & Recommendation(s):  Impression:   ICD-10-CM   1. Atherosclerosis of both carotid arteries  I65.23     2. Presence of internal carotid stent  Z95.828     3. Vertebral artery disease (HCC)  I77.9 EKG 12-Lead    4. Subclavian artery disease (HCC)  I73.9     5. Mixed hyperlipidemia  E78.2 EKG 12-Lead    6. Hx of TIA (transient ischemic attack) and stroke  Z86.73     7. Benign hypertension  I10     8. Cardiac murmur  R01.1 ECHOCARDIOGRAM COMPLETE    9. Former smoker  Z87.891     10. Class 2 severe obesity due to excess calories with serious comorbidity and body mass index (BMI) of 38.0 to 38.9 in adult [employer])  N82.956    E66.01    Z68.38        Recommendation(s):  Atherosclerosis of both carotid arteries Presence of internal carotid stent Vertebral artery disease (HCC) Subclavian artery disease (HCC) Known history of left ICA stent-being followed by annual carotid duplex. Most recent carotid duplex notes patent left ICA stent and right ICA disease relatively stable. However, concerns for right vertebral artery disease, ?  Near  occlusion and increased flow in bilateral subclavian arteries. Clinically he denies any focal neurological deficits, vision changes, trouble with overhead activities. Given that progression of vascular disease a consult was placed for vascular surgery to help us  further evaluate his vertebral and subclavian disease to see if additional intervention is needed.  No appointments have been made. Will speak to scheduling to have this arranged. Recommend that he has either a follow-up with his interventional radiologist Dr. Alvira Josephs or Vascular surgery prior to his cataract surgery.   Mixed hyperlipidemia Currently on Lipitor 40 mg p.o. daily.   He denies myalgia or other side effects. Most recent lipids dated March 2025, independently reviewed as noted above.  LDL is 70 mg/dL and triglycerides 89 mg/dL.  Cardiology following peripherally, managed by primary care provider.  Hx of stroke History of CVA 2016. Status post left ICA stenting in 2016. Most recent LDL and triglycerides are at goal. Reemphasized importance of weight loss. Overall functional capacity remains relatively stable. Reemphasized the importance of secondary prevention with focus on improving his modifiable cardiovascular risk factors such as glycemic control, lipid management, blood pressure control, weight loss.  Benign hypertension Office blood pressures are well-controlled. Continue Benicar 40 mg p.o. daily. Continue spironolactone 25 mg p.o. daily.  Cardiac murmur Echo will be ordered to evaluate for structural heart disease and left ventricular systolic function.  Class 2 severe obesity due to excess calories with serious comorbidity and body mass index (BMI) of 38.0 to 38.9 in adult [facility]) Body mass index is 38.22 kg/m. I reviewed with him importance of diet, regular physical activity/exercise, weight loss.   Patient is educated on the importance of increasing physical activity gradually as tolerated with a goal of  moderate intensity exercise for 30 minutes a day 5 days a week.  Discussed management of at least 2 chronic comorbid conditions, medications reconciled/prescription drug management, reviewed the results of the carotid duplex, EKG ordered and independently reviewed, arranged appointments with scheduling department to either see vascular surgery or interventional radiology for further guidance regarding his vertebral/subclavian disease.  Collateral history provided by the wife as well during today's encounter.  Orders Placed:  Orders Placed This Encounter  Procedures   EKG 12-Lead   ECHOCARDIOGRAM COMPLETE    Standing Status:   Future    Expected Date:   07/30/2023    Expiration Date:   07/22/2024  Where [facility] this test be performed[facility]    Does the patient weigh less than or greater than 250 lbs?:   Patient weighs less than 250 lbs             244 lbs    Perflutren  DEFINITY  (image enhancing agent) should be administered unless hypersensitivity or allergy exist:   Administer Perflutren     Reason for exam-Echo:   Murmur R01.1     Final Medication List:   No orders of the defined types were placed in this encounter.   There are no discontinued medications.   Current Outpatient Medications:    ALPRAZolam  (XANAX ) 0.25 MG tablet, Take 0.25 mg by mouth at bedtime as needed., Disp: , Rfl:    aspirin  EC 81 MG tablet, Take 81 mg by mouth daily., Disp: , Rfl:    atorvastatin  (LIPITOR) 40 MG tablet, Take 1 tablet (40 mg total) by mouth every evening., Disp: 30 tablet, Rfl: 1   Cholecalciferol (VITAMIN D PO), Take 1 capsule by mouth every evening., Disp: , Rfl:    clopidogrel  (PLAVIX ) 75 MG tablet, TAKE 1 TABLET DAILY (Patient taking differently: Take 75 mg by mouth every morning.), Disp: 90 tablet, Rfl: 3   finasteride  (PROSCAR ) 5 MG tablet, TAKE 1 TABLET DAILY, Disp: 100 tablet, Rfl: 3   olmesartan (BENICAR) 40 MG tablet, TAKE 1 TABLET DAILY, Disp: 90 tablet, Rfl: 0    pantoprazole  (PROTONIX ) 40 MG tablet, Take 40 mg by mouth daily., Disp: , Rfl:    polyethylene glycol (MIRALAX / GLYCOLAX) packet, Take 17 g by mouth every morning., Disp: , Rfl:    spironolactone (ALDACTONE) 25 MG tablet, Take 25 mg by mouth daily., Disp: , Rfl:    fluticasone  (FLONASE ) 50 MCG/ACT nasal spray, Place 2 sprays into both nostrils daily., Disp: 16 g, Rfl: 0  Consent:   NA  Disposition:   1 year follow-up sooner if needed  His questions and concerns were addressed to his satisfaction. He voices understanding of the recommendations provided during this encounter.    Signed, Awilda Bogus, [facility]  A Division of [facility] [address] [place], [place] [address]  [place], [place] [address] 07/23/2023 11:05 AM

## 2023-07-23 NOTE — Patient Instructions (Signed)
 Medication Instructions:  No medication changes were made at this visit. Continue current regimen.   *If you need a refill on your cardiac medications before your next appointment, please call your pharmacy*  Lab Work: None ordered today. If you have labs (blood work) drawn today and your tests are completely normal, you will receive your results only by: MyChart Message (if you have MyChart) OR A paper copy in the mail If you have any lab test that is abnormal or we need to change your treatment, we will call you to review the results.  Testing/Procedures: Your physician has requested that you have an echocardiogram. Echocardiography is a painless test that uses sound waves to create images of your heart. It provides your doctor with information about the size and shape of your heart and how well your heart's chambers and valves are working. This procedure takes approximately one hour. There are no restrictions for this procedure. Please do NOT wear cologne, perfume, aftershave, or lotions (deodorant is allowed). Please arrive 15 minutes prior to your appointment time.  Please note: We ask at that you not bring children with you during ultrasound (echo/ vascular) testing. Due to room size and safety concerns, children are not allowed in the ultrasound rooms during exams. Our front office staff cannot provide observation of children in our lobby area while testing is being conducted. An adult accompanying a patient to their appointment will only be allowed in the ultrasound room at the discretion of the ultrasound technician under special circumstances. We apologize for any inconvenience.   Follow-Up: At [facility], you and your health needs are our priority.  As part of our continuing mission to provide you with exceptional heart care, our providers are all part of one team.  This team includes your primary Cardiologist (physician) and Advanced Practice Providers or APPs (Physician  Assistants and Nurse Practitioners) who all work together to provide you with the care you need, when you need it.  Your next appointment:   1 year(s)  Provider:   Olinda Bertrand, DO    Other Instructions [facility]- [phone]

## 2023-07-24 ENCOUNTER — Encounter: Payer: Self-pay | Admitting: Vascular Surgery

## 2023-07-24 ENCOUNTER — Ambulatory Visit: Attending: Vascular Surgery | Admitting: Vascular Surgery

## 2023-07-24 VITALS — BP 149/71 | HR 65 | Temp 97.9°F | Resp 20 | Ht 67.0 in | Wt 246.0 lb

## 2023-07-24 DIAGNOSIS — Z8673 Personal history of transient ischemic attack (TIA), and cerebral infarction without residual deficits: Secondary | ICD-10-CM | POA: Insufficient documentation

## 2023-07-24 DIAGNOSIS — I6523 Occlusion and stenosis of bilateral carotid arteries: Secondary | ICD-10-CM | POA: Diagnosis not present

## 2023-07-24 DIAGNOSIS — I6501 Occlusion and stenosis of right vertebral artery: Secondary | ICD-10-CM | POA: Diagnosis not present

## 2023-07-25 ENCOUNTER — Encounter: Payer: Self-pay | Admitting: Emergency Medicine

## 2023-07-25 ENCOUNTER — Other Ambulatory Visit: Payer: Self-pay

## 2023-07-25 ENCOUNTER — Ambulatory Visit
Admission: EM | Admit: 2023-07-25 | Discharge: 2023-07-25 | Disposition: A | Discharge status: Home / Self Care | Attending: Nurse Practitioner | Admitting: Nurse Practitioner

## 2023-07-25 DIAGNOSIS — M722 Plantar fascial fibromatosis: Secondary | ICD-10-CM | POA: Diagnosis not present

## 2023-07-25 MED ORDER — PREDNISONE 20 MG PO TABS: 40.0000 mg | ORAL_TABLET | Freq: Every day | ORAL | 0 refills | Status: AC

## 2023-07-25 NOTE — Discharge Instructions (Signed)
 Take medication as prescribed. You may take over-the-counter Tylenol  as needed for pain or discomfort. Freeze a water  bottle, use the frozen water  bottle to roll under your foot to help with pain or discomfort.  Recommend doing this at least 3-4 times per day while symptoms persist. Make sure you are wearing shoes with good insole and support when you are exercising or walking long distances. I have also provided exercises for you to do at home.  Try to do the exercises at least 2-3 times daily. If symptoms fail to improve with this treatment over the next 5 to 7 days, please follow-up with your primary care physician or with podiatry for further evaluation. Follow-up as needed.

## 2023-07-25 NOTE — ED Triage Notes (Signed)
 Pt reports left heel pain sine working out at gym yesterday. Pt reports was on the elliptical at time of pain starting. Pt able to bear weight. NAD noted.

## 2023-07-25 NOTE — ED Provider Notes (Signed)
 [facility]    CSN: [id number] Arrival date & time: 07/25/23  1055      History   Chief Complaint Chief Complaint  Patient presents with   Foot Pain    HPI Henry [name] is a 89 y.o. male.   The history is provided by the patient.   Patient presents with a 1 day history of left heel pain.  Patient states symptoms started after he was at the gym on the elliptical machine.  Patient states he has pain when he performs normal weightbearing.  He states he has pain with heel walking.  Patient denies injury, trauma, numbness, tingling, swelling, erythema, or the inability to bear weight.  Patient states that he has not taken any medication for his symptoms.  Past Medical History:  Diagnosis Date   Accelerated hypertension 06/29/2014   Arthritis    Back   Chronic kidney disease    Dizziness and giddiness    Enlarged prostate    Esophagitis 05/10/2011   Foley catheter in place    FOOT SPRAIN, RIGHT 07/18/2007   Qualifier: Diagnosis of  By: Phyllis Breeze MD, Arvel Lather     GERD (gastroesophageal reflux disease)    HOH (hard of hearing)    Hx-TIA (transient ischemic attack)    hx of this- wife states 10 years ago-but Dr. Queen Bruins told wife he had had several-has 2 stents in carotid artery-left   Hypercholesteremia    Hypertension    Low back pain    Nocturia    Shingles    Stroke [facility])    had a stroke 05/23/2014   Syncope and collapse     Patient Active Problem List   Diagnosis Date Noted   Slowing, urinary stream 02/14/2021   Syncope 11/02/2017   Syncope and collapse 03/08/2015   Orthostatic hypotension 03/08/2015   Accelerated hypertension 08/28/2014   Benign prostatic hyperplasia with urinary obstruction 07/09/2014   Right leg swelling 07/06/2014   UTI (urinary tract infection) 07/06/2014   ARF (acute renal failure) as a complication of the indwelling Foley 07/05/2014   Cerebral infarction due to embolism of left middle cerebral artery (HCC) 06/29/2014   Carotid  stenosis 06/29/2014   Urinary retention 06/29/2014   Hematuria 06/29/2014   Aphasia S/P CVA 05/28/2014   Dysphagia, pharyngoesophageal phase 05/28/2014   Left middle cerebral artery stroke (HCC) 05/26/2014   Coarctation of aorta, recurrent, post-intervention    Expressive aphasia    Acute right-sided weakness    Stroke (HCC) 05/23/2014   S/P shoulder surgery 12/24/2012   S/P arthroscopy of shoulder 12/16/2012   Shoulder arthritis 12/13/2012   Biceps tendon rupture, proximal 12/13/2012   Partial tear of right subscapularis tendon 12/13/2012   Hypertension 05/10/2011   Hyperlipidemia 05/10/2011   Barrett esophagus 05/10/2011    Past Surgical History:  Procedure Laterality Date   carotid stents  05/2014   2 stents in carotid artery on left side   COLONOSCOPY     COLONOSCOPY  05/17/2011   Procedure: COLONOSCOPY;  Surgeon: Ruby Corporal, MD;  [facility];  Service: Endoscopy;  Laterality: N/A;  830   epidural steroid injection  05/22/2014   had this in lumbar and at 1 am on 05/23/2014 had a stroke   ESOPHAGOGASTRODUODENOSCOPY  10/28/2010   Procedure: ESOPHAGOGASTRODUODENOSCOPY (EGD);  Surgeon: Ruby Corporal, MD;  [facility];  Service: Endoscopy;  Laterality: N/A;  10:30   ESOPHAGOGASTRODUODENOSCOPY N/A 04/24/2014   Procedure: ESOPHAGOGASTRODUODENOSCOPY (EGD);  Surgeon: Ruby Corporal,  MD;  [facility];  Service: Endoscopy;  Laterality: N/A;  815   ESOPHAGOGASTRODUODENOSCOPY N/A 06/26/2014   Procedure: ESOPHAGOGASTRODUODENOSCOPY (EGD);  Surgeon: Alvis Jourdain, MD;  Location: [facility];  Service: Endoscopy;  Laterality: N/A;   EUS N/A 06/26/2014   Procedure: FULL UPPER ENDOSCOPIC ULTRASOUND (EUS) RADIAL;  Surgeon: Alvis Jourdain, MD;  Location: [facility];  Service: Endoscopy;  Laterality: N/A;   IR GENERIC HISTORICAL  10/12/2015   IR ANGIO VERTEBRAL SEL VERTEBRAL BILAT MOD SED 10/12/2015 Luellen Sages, MD [facility]   IR GENERIC HISTORICAL   10/12/2015   IR ANGIO INTRA EXTRACRAN SEL COM CAROTID INNOMINATE BILAT MOD SED 10/12/2015 Luellen Sages, MD [facility]   RADIOLOGY WITH ANESTHESIA N/A 05/23/2014   Procedure: RADIOLOGY WITH ANESTHESIA;  Surgeon: Medication Radiologist, MD;  [facility];  Service: Radiology;  Laterality: N/A;   RESECTION DISTAL CLAVICAL Right 12/13/2012   Procedure: RESECTION DISTAL CLAVICAL;  Surgeon: Darrin Emerald, MD;  Location: [facility];  Service: Orthopedics;  Laterality: Right;   SHOULDER ARTHROSCOPY WITH BICEPSTENOTOMY Right 12/13/2012   Procedure: SHOULDER ARTHROSCOPY WITH BICEPS TENOTOMY, limited shoulder debridement;  Surgeon: Darrin Emerald, MD;  Location: [facility];  Service: Orthopedics;  Laterality: Right;   SHOULDER SURGERY Right    torn cartiledge   SHOULDER SURGERY     right shoulder   TRANSURETHRAL RESECTION OF PROSTATE N/A 07/09/2014   Procedure: TRANSURETHRAL RESECTION of prostate WITH BUTTON ;  Surgeon: Homero Luster, MD;  Location: [facility];  Service: Urology;  Laterality: N/A;       Home Medications    Prior to Admission medications   Medication Sig Start Date End Date Taking? Authorizing Provider  predniSONE  (DELTASONE ) 20 MG tablet Take 2 tablets (40 mg total) by mouth daily with breakfast for 5 days. 07/25/23 07/30/23 Yes Leath-Warren, Belen Bowers, NP  ALPRAZolam  (XANAX ) 0.25 MG tablet Take 0.25 mg by mouth at bedtime as needed. 03/18/19   [provider]  aspirin  EC 81 MG tablet Take 81 mg by mouth daily.    [provider]  atorvastatin  (LIPITOR) 40 MG tablet Take 1 tablet (40 mg total) by mouth every evening. 06/05/14   Angiulli, Everlyn Hockey, PA-C  Cholecalciferol (VITAMIN D PO) Take 1 capsule by mouth every evening.    [provider]  clopidogrel  (PLAVIX ) 75 MG tablet TAKE 1 TABLET DAILY Patient taking differently: Take 75 mg by mouth every morning. 05/14/15   Consuelo Denmark, MD  finasteride  (PROSCAR ) 5 MG tablet TAKE 1 TABLET DAILY 01/26/22   Trent Frizzle, MD  olmesartan (BENICAR) 40 MG tablet TAKE 1 TABLET DAILY 05/18/23   Tolia, Sunit, DO  pantoprazole  (PROTONIX ) 40 MG tablet Take 40 mg by mouth daily.    [provider]  polyethylene glycol (MIRALAX / GLYCOLAX) packet Take 17 g by mouth every morning.    [provider]  spironolactone (ALDACTONE) 25 MG tablet Take 25 mg by mouth daily. 06/01/19   [provider]    Family History Family History  Problem Relation Age of Onset   Thyroid  disease Mother    Colon cancer Neg Hx     Social History Social History   Tobacco Use   Smoking status: Former    Current packs/day: 0.00    Average packs/day: 0.5 packs/day for 35.0 years (17.5 ttl pk-yrs)    Types: Cigarettes    Start date: 7    Quit date: 1986    Years since quitting: 39.4   Smokeless tobacco:  Never  Vaping Use   Vaping status: Never Used  Substance Use Topics   Alcohol  use: Yes    Alcohol /week: 3.0 standard drinks of alcohol     Types: 3 Glasses of wine per week    Comment: 1 glass of wine every day   Drug use: No     Allergies   Patient has no known allergies.   Review of Systems Review of Systems Per HPI  Physical Exam Triage Vital Signs ED Triage Vitals  Encounter Vitals Group     BP 07/25/23 1102 (!) 171/77     Systolic BP Percentile --      Diastolic BP Percentile --      Pulse Rate 07/25/23 1102 76     Resp 07/25/23 1102 20     Temp 07/25/23 1102 98.7 F (37.1 C)     Temp Source 07/25/23 1102 Oral     SpO2 07/25/23 1102 97 %     Weight --      Height --      Head Circumference --      Peak Flow --      Pain Score 07/25/23 1103 8     Pain Loc --      Pain Education --      Exclude from Growth Chart --    No data found.  Updated Vital Signs BP (!) 171/77 (BP Location: Right Arm)   Pulse 76   Temp 98.7 F (37.1 C) (Oral)   Resp 20   SpO2 97%   Visual Acuity Right Eye Distance:   Left Eye Distance:   Bilateral Distance:    Right Eye Near:   Left  Eye Near:    Bilateral Near:     Physical Exam Vitals and nursing note reviewed.  Constitutional:      General: He is not in acute distress.    Appearance: Normal appearance.  HENT:     Head: Normocephalic.  Eyes:     Extraocular Movements: Extraocular movements intact.     Pupils: Pupils are equal, round, and reactive to light.  Pulmonary:     Effort: Pulmonary effort is normal.  Musculoskeletal:     Cervical back: Normal range of motion.     Left foot: Normal capillary refill. Tenderness (Left calcaneus) present. No swelling, deformity or bony tenderness. Normal pulse.     Comments: +2 DP/PT pulse, neurovascularly intact  Feet:     Left foot:     Skin integrity: No ulcer, blister, skin breakdown, erythema, warmth or callus.  Skin:    General: Skin is warm and dry.  Neurological:     General: No focal deficit present.     Mental Status: He is alert and oriented to person, place, and time.  Psychiatric:        Mood and Affect: Mood normal.        Behavior: Behavior normal.      UC Treatments / Results  Labs (all labs ordered are listed, but only abnormal results are displayed) Labs Reviewed - No data to display  EKG   Radiology No results found.  Procedures Procedures (including critical care time)  Medications Ordered in UC Medications - No data to display  Initial Impression / Assessment and Plan / UC Course  I have reviewed the triage vital signs and the nursing notes.  Pertinent labs & imaging results that were available during my care of the patient were reviewed by me and considered in my medical decision making (  see chart for details).  Patient presents for complaints of left heel pain x 1 day.  He denies injury or trauma, imaging is deferred at this time.  Symptoms consistent with plantar fasciitis.  Start prednisone  40 mg for the next 5 days.  Supportive care recommendations were provided and discussed with the patient to include over-the-counter  analgesics, the use of water  bottle to roll under the foot, and wearing shoes with good insole and support.  Discussed indications with patient regarding follow-up.  Patient was in agreement with this plan of care and verbalizes understanding.  All questions were answered.  Patient stable for discharge.  Final Clinical Impressions(s) / UC Diagnoses   Final diagnoses:  Plantar fasciitis of left foot     Discharge Instructions      Take medication as prescribed. You may take over-the-counter Tylenol  as needed for pain or discomfort. Freeze a water  bottle, use the frozen water  bottle to roll under your foot to help with pain or discomfort.  Recommend doing this at least 3-4 times per day while symptoms persist. Make sure you are wearing shoes with good insole and support when you are exercising or walking long distances. I have also provided exercises for you to do at home.  Try to do the exercises at least 2-3 times daily. If symptoms fail to improve with this treatment over the next 5 to 7 days, please follow-up with your primary care physician or with podiatry for further evaluation. Follow-up as needed.   ED Prescriptions     Medication Sig Dispense Auth. Provider   predniSONE  (DELTASONE ) 20 MG tablet Take 2 tablets (40 mg total) by mouth daily with breakfast for 5 days. 10 tablet Leath-Warren, Belen Bowers, NP      PDMP not reviewed this encounter.   Hardy Lia, NP 07/25/23 1134

## 2023-08-07 ENCOUNTER — Encounter: Payer: Self-pay | Admitting: *Deleted

## 2023-08-07 ENCOUNTER — Telehealth: Payer: Medicare Other | Admitting: Adult Health

## 2023-08-08 ENCOUNTER — Telehealth: Payer: Medicare Other | Admitting: Adult Health

## 2023-08-08 DIAGNOSIS — G4733 Obstructive sleep apnea (adult) (pediatric): Secondary | ICD-10-CM | POA: Diagnosis not present

## 2023-08-08 NOTE — Progress Notes (Signed)
 PATIENT: Henry [name] DOB: October 09, 1934  REASON FOR VISIT: follow up HISTORY FROM: patient PRIMARY NEUROLOGIST: Dr. Buck  Virtual Visit via Video Note  I connected with Henry [name] on 08/08/23 at 11:30 AM EDT by a video enabled telemedicine application located remotely at [facility] and verified that I am speaking with the correct person using two identifiers who was located at their own home in [place]   I discussed the limitations of evaluation and management by telemedicine and the availability of in person appointments. The patient expressed understanding and agreed to proceed.   PATIENT: Henry [name] DOB: 11/07/1934  REASON FOR VISIT: follow up HISTORY FROM: patient  HISTORY OF PRESENT ILLNESS: Today 08/08/23:  [name] is a 89 y.o. male with a history of obstructive sleep apnea on CPAP. Returns today for follow-up.  He reports everything is working well with the CPAP.  He denies any new issues.  He reports he will be having cataract surgery.  His download is below     08/08/22: Henry [name] is a 89 y.o. male with a history of OSA on CPAP. Returns today for follow-up.  He reports that the CPAP is working well for him.  He denies any new issues.  His download is below      REVIEW OF SYSTEMS: Out of a complete 14 system review of symptoms, the patient complains only of the following symptoms, and all other reviewed systems are negative.  ALLERGIES: No Known Allergies  HOME MEDICATIONS: Outpatient Medications Prior to Visit  Medication Sig Dispense Refill   ALPRAZolam  (XANAX ) 0.25 MG tablet Take 0.25 mg by mouth at bedtime as needed.     aspirin  EC 81 MG tablet Take 81 mg by mouth daily.     atorvastatin  (LIPITOR) 40 MG tablet Take 1 tablet (40 mg total) by mouth every evening. 30 tablet 1   Cholecalciferol (VITAMIN D PO) Take 1 capsule by mouth every evening.     clopidogrel  (PLAVIX ) 75 MG tablet TAKE 1 TABLET DAILY (Patient  taking differently: Take 75 mg by mouth every morning.) 90 tablet 3   finasteride  (PROSCAR ) 5 MG tablet TAKE 1 TABLET DAILY 100 tablet 3   olmesartan (BENICAR) 40 MG tablet TAKE 1 TABLET DAILY 90 tablet 0   pantoprazole  (PROTONIX ) 40 MG tablet Take 40 mg by mouth daily.     polyethylene glycol (MIRALAX / GLYCOLAX) packet Take 17 g by mouth every morning.     spironolactone (ALDACTONE) 25 MG tablet Take 25 mg by mouth daily.     No facility-administered medications prior to visit.    PAST MEDICAL HISTORY: Past Medical History:  Diagnosis Date   Accelerated hypertension 06/29/2014   Arthritis    Back   Chronic kidney disease    Dizziness and giddiness    Enlarged prostate    Esophagitis 05/10/2011   Foley catheter in place    FOOT SPRAIN, RIGHT 07/18/2007   Qualifier: Diagnosis of  By: Margrette MD, Taft     GERD (gastroesophageal reflux disease)    HOH (hard of hearing)    Hx-TIA (transient ischemic attack)    hx of this- wife states 10 years ago-but Dr. Herminio told wife he had had several-has 2 stents in carotid artery-left   Hypercholesteremia    Hypertension    Low back pain    Nocturia    Shingles    Stroke [facility])    had a stroke 05/23/2014   Syncope  and collapse     PAST SURGICAL HISTORY: Past Surgical History:  Procedure Laterality Date   carotid stents  05/2014   2 stents in carotid artery on left side   COLONOSCOPY     COLONOSCOPY  05/17/2011   Procedure: COLONOSCOPY;  Surgeon: Claudis RAYMOND Rivet, MD;  [facility];  Service: Endoscopy;  Laterality: N/A;  830   epidural steroid injection  05/22/2014   had this in lumbar and at 1 am on 05/23/2014 had a stroke   ESOPHAGOGASTRODUODENOSCOPY  10/28/2010   Procedure: ESOPHAGOGASTRODUODENOSCOPY (EGD);  Surgeon: Claudis RAYMOND Rivet, MD;  [facility];  Service: Endoscopy;  Laterality: N/A;  10:30   ESOPHAGOGASTRODUODENOSCOPY N/A 04/24/2014   Procedure: ESOPHAGOGASTRODUODENOSCOPY (EGD);  Surgeon: Claudis RAYMOND Rivet, MD;  [facility];  Service: Endoscopy;  Laterality: N/A;  815   ESOPHAGOGASTRODUODENOSCOPY N/A 06/26/2014   Procedure: ESOPHAGOGASTRODUODENOSCOPY (EGD);  Surgeon: Belvie Just, MD;  Location: [facility];  Service: Endoscopy;  Laterality: N/A;   EUS N/A 06/26/2014   Procedure: FULL UPPER ENDOSCOPIC ULTRASOUND (EUS) RADIAL;  Surgeon: Belvie Just, MD;  [facility];  Service: Endoscopy;  Laterality: N/A;   IR GENERIC HISTORICAL  10/12/2015   IR ANGIO VERTEBRAL SEL VERTEBRAL BILAT MOD SED 10/12/2015 Henry Nash, MD [facility]   IR GENERIC HISTORICAL  10/12/2015   IR ANGIO INTRA EXTRACRAN SEL COM CAROTID INNOMINATE BILAT MOD SED 10/12/2015 Henry Nash, MD [facility]   RADIOLOGY WITH ANESTHESIA N/A 05/23/2014   Procedure: RADIOLOGY WITH ANESTHESIA;  Surgeon: Medication Radiologist, MD;  [facility];  Service: Radiology;  Laterality: N/A;   RESECTION DISTAL CLAVICAL Right 12/13/2012   Procedure: RESECTION DISTAL CLAVICAL;  Surgeon: Taft FORBES Minerva, MD;  Location: [facility];  Service: Orthopedics;  Laterality: Right;   SHOULDER ARTHROSCOPY WITH BICEPSTENOTOMY Right 12/13/2012   Procedure: SHOULDER ARTHROSCOPY WITH BICEPS TENOTOMY, limited shoulder debridement;  Surgeon: Taft FORBES Minerva, MD;  Location: [facility];  Service: Orthopedics;  Laterality: Right;   SHOULDER SURGERY Right    torn cartiledge   SHOULDER SURGERY     right shoulder   TRANSURETHRAL RESECTION OF PROSTATE N/A 07/09/2014   Procedure: TRANSURETHRAL RESECTION of prostate WITH BUTTON ;  Surgeon: Norleen Seltzer, MD;  [facility];  Service: Urology;  Laterality: N/A;    FAMILY HISTORY: Family History  Problem Relation Age of Onset   Thyroid  disease Mother    Colon cancer Neg Hx     SOCIAL HISTORY: Social History   Socioeconomic History   Marital status: Married    Spouse name: Not on file   Number of children: 4   Years of education: 16   Highest education level: Not on file   Occupational History   Occupation: retired    Comment: [employer]  Tobacco Use   Smoking status: Former    Current packs/day: 0.00    Average packs/day: 0.5 packs/day for 35.0 years (17.5 ttl pk-yrs)    Types: Cigarettes    Start date: 54    Quit date: 1986    Years since quitting: 39.5   Smokeless tobacco: Never  Vaping Use   Vaping status: Never Used  Substance and Sexual Activity   Alcohol  use: Yes    Alcohol /week: 3.0 standard drinks of alcohol     Types: 3 Glasses of wine per week    Comment: 1 glass of wine every day   Drug use: No   Sexual activity: Yes    Birth control/protection: None  Other Topics  Concern   Not on file  Social History Narrative   Married, retired - [employer]   Right handed   Caffeine use - 3-4 cups coffee daily   Social Drivers of Corporate investment banker Strain: Not on file  Food Insecurity: Not on file  Transportation Needs: Not on file  Physical Activity: Not on file  Stress: Not on file  Social Connections: Not on file  Intimate Partner Violence: Not on file      PHYSICAL EXAM Generalized: Well developed, in no acute distress   Neurological examination  Mentation: Alert oriented to time, place, history taking. Follows all commands speech and language fluent Cranial nerve II-XII: Facial symmetry noted  DIAGNOSTIC DATA (LABS, IMAGING, TESTING) - I reviewed patient records, labs, notes, testing and imaging myself where available.  Lab Results  Component Value Date   WBC 8.6 11/03/2017   HGB 12.9 (L) 11/03/2017   HCT 38.8 (L) 11/03/2017   MCV 85.8 11/03/2017   PLT 210 11/03/2017      Component Value Date/Time   NA 135 11/03/2017 0414   NA 141 10/11/2010 0000   K 4.2 11/03/2017 0414   CL 104 11/03/2017 0414   CO2 26 11/03/2017 0414   GLUCOSE 105 (H) 11/03/2017 0414   BUN 14 11/03/2017 0414   BUN 21 10/11/2010 0000   CREATININE 1.02 11/03/2017 0414   CALCIUM  8.5 (L) 11/03/2017 0414   PROT 6.2 (L) 11/03/2017 0414    ALBUMIN 3.3 (L) 11/03/2017 0414   AST 25 11/03/2017 0414   ALT 19 11/03/2017 0414   ALKPHOS 35 (L) 11/03/2017 0414   BILITOT 0.8 11/03/2017 0414   GFRNONAA >60 11/03/2017 0414   GFRAA >60 11/03/2017 0414   Lab Results  Component Value Date   CHOL 110 05/24/2014   HDL 35 (L) 05/24/2014   LDLCALC 59 05/24/2014   TRIG 79 05/24/2014   CHOLHDL 3.1 05/24/2014   Lab Results  Component Value Date   HGBA1C 6.2 (H) 05/24/2014   No results found for: VITAMINB12 Lab Results  Component Value Date   TSH 0.73 10/11/2010      ASSESSMENT AND PLAN 89 y.o. year old male  has a past medical history of Accelerated hypertension (06/29/2014), Arthritis, Chronic kidney disease, Dizziness and giddiness, Enlarged prostate, Esophagitis (05/10/2011), Foley catheter in place, FOOT SPRAIN, RIGHT (07/18/2007), GERD (gastroesophageal reflux disease), HOH (hard of hearing), TIA (transient ischemic attack), Hypercholesteremia, Hypertension, Low back pain, Nocturia, Shingles, Stroke (HCC), and Syncope and collapse. here with:  OSA on CPAP  CPAP compliance excellent Residual AHI is good Encouraged patient to continue using CPAP nightly and > 4 hours each night F/U in 1 year or sooner if needed    Duwaine Russell, MSN, NP-C 08/08/2023, 8:08 AM [facility] [address] [place], [place] [address] [phone]  The patient's condition requires frequent monitoring and adjustments in the treatment plan, reflecting the ongoing complexity of care.  This provider is the continuing focal point for all needed services for this condition.

## 2023-08-08 NOTE — Patient Instructions (Signed)
 Continue using CPAP nightly and greater than 4 hours each night If your symptoms worsen or you develop new symptoms please let us know.

## 2023-08-17 ENCOUNTER — Other Ambulatory Visit: Payer: Self-pay | Admitting: Cardiology

## 2023-08-24 ENCOUNTER — Ambulatory Visit (INDEPENDENT_AMBULATORY_CARE_PROVIDER_SITE_OTHER): Admitting: Podiatry

## 2023-08-24 DIAGNOSIS — M722 Plantar fascial fibromatosis: Secondary | ICD-10-CM | POA: Diagnosis not present

## 2023-08-24 DIAGNOSIS — M62462 Contracture of muscle, left lower leg: Secondary | ICD-10-CM | POA: Diagnosis not present

## 2023-08-24 NOTE — Progress Notes (Unsigned)
 Subjective:  Patient ID: Henry [name], male    DOB: Jul 04, 1934,  MRN: [id number]  Chief Complaint  Patient presents with   Foot Pain    Heel pain for about 2 weeks     89 y.o. male presents with the above complaint.  Patient presents with complaint left heel pain.  Patient states is painful to touch has progressed gotten worse worse with ambulation or shoe?  2 weeks he would like to discuss treatment options for pain scale 7 out of 10 dull aching nature has not seen MRIs prior to seeing me for this   Review of Systems: Negative except as noted in the HPI. Denies N/V/F/Ch.  Past Medical History:  Diagnosis Date   Accelerated hypertension 06/29/2014   Arthritis    Back   Chronic kidney disease    Dizziness and giddiness    Enlarged prostate    Esophagitis 05/10/2011   Foley catheter in place    FOOT SPRAIN, RIGHT 07/18/2007   Qualifier: Diagnosis of  By: Margrette MD, Taft     GERD (gastroesophageal reflux disease)    HOH (hard of hearing)    Hx-TIA (transient ischemic attack)    hx of this- wife states 10 years ago-but Dr. Herminio told wife he had had several-has 2 stents in carotid artery-left   Hypercholesteremia    Hypertension    Low back pain    Nocturia    Shingles    Stroke [facility])    had a stroke 05/23/2014   Syncope and collapse     Current Outpatient Medications:    ALPRAZolam  (XANAX ) 0.25 MG tablet, Take 0.25 mg by mouth at bedtime as needed., Disp: , Rfl:    aspirin  EC 81 MG tablet, Take 81 mg by mouth daily., Disp: , Rfl:    atorvastatin  (LIPITOR) 40 MG tablet, Take 1 tablet (40 mg total) by mouth every evening., Disp: 30 tablet, Rfl: 1   Cholecalciferol (VITAMIN D PO), Take 1 capsule by mouth every evening., Disp: , Rfl:    clopidogrel  (PLAVIX ) 75 MG tablet, TAKE 1 TABLET DAILY (Patient taking differently: Take 75 mg by mouth every morning.), Disp: 90 tablet, Rfl: 3   finasteride  (PROSCAR ) 5 MG tablet, TAKE 1 TABLET DAILY, Disp: 100 tablet, Rfl: 3    olmesartan (BENICAR) 40 MG tablet, Take 1 tablet (40 mg total) by mouth daily., Disp: 90 tablet, Rfl: 3   pantoprazole  (PROTONIX ) 40 MG tablet, Take 40 mg by mouth daily., Disp: , Rfl:    polyethylene glycol (MIRALAX / GLYCOLAX) packet, Take 17 g by mouth every morning., Disp: , Rfl:    spironolactone (ALDACTONE) 25 MG tablet, Take 25 mg by mouth daily., Disp: , Rfl:   Social History   Tobacco Use  Smoking Status Former   Current packs/day: 0.00   Average packs/day: 0.5 packs/day for 35.0 years (17.5 ttl pk-yrs)   Types: Cigarettes   Start date: 71   Quit date: 37   Years since quitting: 39.5  Smokeless Tobacco Never    No Known Allergies Objective:  There were no vitals filed for this visit. There is no height or weight on file to calculate BMI. Constitutional Well developed. Well nourished.  Vascular Dorsalis pedis pulses palpable bilaterally. Posterior tibial pulses palpable bilaterally. Capillary refill normal to all digits.  No cyanosis or clubbing noted. Pedal hair growth normal.  Neurologic Normal speech. Oriented to person, place, and time. Epicritic sensation to light touch grossly present bilaterally.  Dermatologic Nails well groomed and  normal in appearance. No open wounds. No skin lesions.  Orthopedic: Normal joint ROM without pain or crepitus bilaterally. No visible deformities. Tender to palpation at the calcaneal tuber left. No pain with calcaneal squeeze left. Ankle ROM diminished range of motion left. Silfverskiold Test: positive left.   Radiographs: None  Assessment:   1. Gastrocnemius equinus, left   2. Plantar fasciitis, left    Plan:  Patient was evaluated and treated and all questions answered.  Plantar Fasciitis, left with underlying gastrocnemius equinus - XR reviewed as above.  - Educated on icing and stretching. Instructions given.  - Injection delivered to the plantar fascia as below. - DME: Plantar fascial brace dispensed to  support the medial longitudinal arch of the foot and offload pressure from the heel and prevent arch collapse during weightbearing - Pharmacologic management: None  Procedure: Injection Tendon/Ligament Location: Left plantar fascia at the glabrous junction; medial approach. Skin Prep: alcohol  Injectate: 0.5 cc 0.5% marcaine  plain, 0.5 cc of 1% Lidocaine , 0.5 cc kenalog  10. Disposition: Patient tolerated procedure well. Injection site dressed with a band-aid.  No follow-ups on file.

## 2023-09-06 ENCOUNTER — Ambulatory Visit (HOSPITAL_COMMUNITY)
Admission: RE | Admit: 2023-09-06 | Discharge: 2023-09-06 | Disposition: A | Discharge status: Home / Self Care | Source: Ambulatory Visit | Attending: Cardiovascular Disease | Admitting: Cardiovascular Disease

## 2023-09-06 DIAGNOSIS — R011 Cardiac murmur, unspecified: Secondary | ICD-10-CM | POA: Insufficient documentation

## 2023-09-06 LAB — ECHOCARDIOGRAM COMPLETE
AR max vel: 1.51 cm2
AV Area VTI: 1.63 cm2
AV Area mean vel: 1.59 cm2
AV Mean grad: 13.5 mmHg
AV Peak grad: 25.8 mmHg
Ao pk vel: 2.54 m/s
S' Lateral: 2.87 cm

## 2023-09-09 ENCOUNTER — Ambulatory Visit: Payer: Self-pay | Admitting: Cardiology

## 2023-09-21 ENCOUNTER — Ambulatory Visit: Admitting: Podiatry

## 2023-10-16 DIAGNOSIS — I6501 Occlusion and stenosis of right vertebral artery: Secondary | ICD-10-CM | POA: Diagnosis not present

## 2023-10-16 DIAGNOSIS — G4733 Obstructive sleep apnea (adult) (pediatric): Secondary | ICD-10-CM | POA: Diagnosis not present

## 2023-10-16 DIAGNOSIS — M48062 Spinal stenosis, lumbar region with neurogenic claudication: Secondary | ICD-10-CM | POA: Diagnosis not present

## 2023-10-16 DIAGNOSIS — I10 Essential (primary) hypertension: Secondary | ICD-10-CM | POA: Diagnosis not present

## 2023-11-07 DIAGNOSIS — M546 Pain in thoracic spine: Secondary | ICD-10-CM | POA: Diagnosis not present

## 2023-11-07 DIAGNOSIS — M9905 Segmental and somatic dysfunction of pelvic region: Secondary | ICD-10-CM | POA: Diagnosis not present

## 2023-11-07 DIAGNOSIS — M9902 Segmental and somatic dysfunction of thoracic region: Secondary | ICD-10-CM | POA: Diagnosis not present

## 2023-11-07 DIAGNOSIS — M62830 Muscle spasm of back: Secondary | ICD-10-CM | POA: Diagnosis not present

## 2023-11-07 DIAGNOSIS — M9903 Segmental and somatic dysfunction of lumbar region: Secondary | ICD-10-CM | POA: Diagnosis not present

## 2023-12-05 DIAGNOSIS — Z23 Encounter for immunization: Secondary | ICD-10-CM | POA: Diagnosis not present

## 2023-12-14 ENCOUNTER — Encounter: Payer: Self-pay | Admitting: Emergency Medicine

## 2023-12-14 ENCOUNTER — Ambulatory Visit: Admission: EM | Admit: 2023-12-14 | Discharge: 2023-12-14 | Disposition: A | Discharge status: Home / Self Care

## 2023-12-14 DIAGNOSIS — H9193 Unspecified hearing loss, bilateral: Secondary | ICD-10-CM

## 2023-12-14 NOTE — ED Provider Notes (Signed)
 [facility]    CSN: [id number] Arrival date & time: 12/14/23  0919      History   Chief Complaint No chief complaint on file.   HPI Henry [name] is a 89 y.o. male.   Patient presenting today with 33-month history of muffled hearing.  He states he spoke with the hearing aid specialist and they recommended that he come get checked for earwax and get his ears cleaned out.  Denies any pain, pressure, ringing, headache, nasal congestion or any other symptoms.  So far not trying anything over-the-counter for the symptoms.    Past Medical History:  Diagnosis Date   Accelerated hypertension 06/29/2014   Arthritis    Back   Chronic kidney disease    Dizziness and giddiness    Enlarged prostate    Esophagitis 05/10/2011   Foley catheter in place    FOOT SPRAIN, RIGHT 07/18/2007   Qualifier: Diagnosis of  By: Margrette MD, Taft     GERD (gastroesophageal reflux disease)    HOH (hard of hearing)    Hx-TIA (transient ischemic attack)    hx of this- wife states 10 years ago-but Dr. Herminio told wife he had had several-has 2 stents in carotid artery-left   Hypercholesteremia    Hypertension    Low back pain    Nocturia    Shingles    Stroke [facility])    had a stroke 05/23/2014   Syncope and collapse     Patient Active Problem List   Diagnosis Date Noted   Slowing, urinary stream 02/14/2021   Syncope 11/02/2017   Syncope and collapse 03/08/2015   Orthostatic hypotension 03/08/2015   Accelerated hypertension 08/28/2014   Benign prostatic hyperplasia with urinary obstruction 07/09/2014   Right leg swelling 07/06/2014   UTI (urinary tract infection) 07/06/2014   ARF (acute renal failure) as a complication of the indwelling Foley 07/05/2014   Cerebral infarction due to embolism of left middle cerebral artery (HCC) 06/29/2014   Carotid stenosis 06/29/2014   Urinary retention 06/29/2014   Hematuria 06/29/2014   Aphasia S/P CVA 05/28/2014   Dysphagia,  pharyngoesophageal phase 05/28/2014   Left middle cerebral artery stroke (HCC) 05/26/2014   Coarctation of aorta, recurrent, post-intervention    Expressive aphasia    Acute right-sided weakness    Stroke (HCC) 05/23/2014   S/P shoulder surgery 12/24/2012   S/P arthroscopy of shoulder 12/16/2012   Shoulder arthritis 12/13/2012   Biceps tendon rupture, proximal 12/13/2012   Partial tear of right subscapularis tendon 12/13/2012   Hypertension 05/10/2011   Hyperlipidemia 05/10/2011   Barrett esophagus 05/10/2011    Past Surgical History:  Procedure Laterality Date   carotid stents  05/2014   2 stents in carotid artery on left side   COLONOSCOPY     COLONOSCOPY  05/17/2011   Procedure: COLONOSCOPY;  Surgeon: Claudis RAYMOND Rivet, MD;  Location: [facility];  Service: Endoscopy;  Laterality: N/A;  830   epidural steroid injection  05/22/2014   had this in lumbar and at 1 am on 05/23/2014 had a stroke   ESOPHAGOGASTRODUODENOSCOPY  10/28/2010   Procedure: ESOPHAGOGASTRODUODENOSCOPY (EGD);  Surgeon: Claudis RAYMOND Rivet, MD;  Location: [facility];  Service: Endoscopy;  Laterality: N/A;  10:30   ESOPHAGOGASTRODUODENOSCOPY N/A 04/24/2014   Procedure: ESOPHAGOGASTRODUODENOSCOPY (EGD);  Surgeon: Claudis RAYMOND Rivet, MD;  Location: [facility];  Service: Endoscopy;  Laterality: N/A;  815   ESOPHAGOGASTRODUODENOSCOPY N/A 06/26/2014   Procedure: ESOPHAGOGASTRODUODENOSCOPY (EGD);  Surgeon: Belvie Just, MD;  Location: [facility];  Service: Endoscopy;  Laterality: N/A;   EUS N/A 06/26/2014   Procedure: FULL UPPER ENDOSCOPIC ULTRASOUND (EUS) RADIAL;  Surgeon: Belvie Just, MD;  Location: [facility];  Service: Endoscopy;  Laterality: N/A;   IR GENERIC HISTORICAL  10/12/2015   IR ANGIO VERTEBRAL SEL VERTEBRAL BILAT MOD SED 10/12/2015 Thyra Nash, MD [facility]   IR GENERIC HISTORICAL  10/12/2015   IR ANGIO INTRA EXTRACRAN SEL COM CAROTID INNOMINATE BILAT MOD SED 10/12/2015 Thyra Nash, MD [facility]   RADIOLOGY WITH ANESTHESIA N/A 05/23/2014   Procedure: RADIOLOGY WITH ANESTHESIA;  Surgeon: Medication Radiologist, MD;  Location: [facility];  Service: Radiology;  Laterality: N/A;   RESECTION DISTAL CLAVICAL Right 12/13/2012   Procedure: RESECTION DISTAL CLAVICAL;  Surgeon: Taft FORBES Minerva, MD;  Location: [facility];  Service: Orthopedics;  Laterality: Right;   SHOULDER ARTHROSCOPY WITH BICEPSTENOTOMY Right 12/13/2012   Procedure: SHOULDER ARTHROSCOPY WITH BICEPS TENOTOMY, limited shoulder debridement;  Surgeon: Taft FORBES Minerva, MD;  Location: [facility];  Service: Orthopedics;  Laterality: Right;   SHOULDER SURGERY Right    torn cartiledge   SHOULDER SURGERY     right shoulder   TRANSURETHRAL RESECTION OF PROSTATE N/A 07/09/2014   Procedure: TRANSURETHRAL RESECTION of prostate WITH BUTTON ;  Surgeon: Norleen Seltzer, MD;  Location: [facility];  Service: Urology;  Laterality: N/A;       Home Medications    Prior to Admission medications   Medication Sig Start Date End Date Taking? Authorizing Provider  ALPRAZolam  (XANAX ) 0.25 MG tablet Take 0.25 mg by mouth at bedtime as needed. 03/18/19   [provider]  aspirin  EC 81 MG tablet Take 81 mg by mouth daily.    [provider]  atorvastatin  (LIPITOR) 40 MG tablet Take 1 tablet (40 mg total) by mouth every evening. 06/05/14   Angiulli, Toribio PARAS, PA-C  Cholecalciferol (VITAMIN D PO) Take 1 capsule by mouth every evening.    [provider]  clopidogrel  (PLAVIX ) 75 MG tablet TAKE 1 TABLET DAILY Patient taking differently: Take 75 mg by mouth every morning. 05/14/15   Jerri Pfeiffer, MD  finasteride  (PROSCAR ) 5 MG tablet TAKE 1 TABLET DAILY 01/26/22   Matilda Senior, MD  olmesartan (BENICAR) 40 MG tablet Take 1 tablet (40 mg total) by mouth daily. 08/20/23   Tolia, Sunit, DO  pantoprazole  (PROTONIX ) 40 MG tablet Take 40 mg by mouth daily.    [provider]  polyethylene glycol (MIRALAX / GLYCOLAX) packet Take 17 g by  mouth every morning.    [provider]  spironolactone (ALDACTONE) 25 MG tablet Take 25 mg by mouth daily. 06/01/19   [provider]    Family History Family History  Problem Relation Age of Onset   Thyroid  disease Mother    Colon cancer Neg Hx     Social History Social History   Tobacco Use   Smoking status: Former    Current packs/day: 0.00    Average packs/day: 0.5 packs/day for 35.0 years (17.5 ttl pk-yrs)    Types: Cigarettes    Start date: 30    Quit date: 1986    Years since quitting: 39.8   Smokeless tobacco: Never  Vaping Use   Vaping status: Never Used  Substance Use Topics   Alcohol  use: Yes    Alcohol /week: 3.0 standard drinks of alcohol     Types: 3 Glasses of wine per week    Comment: 1 glass of wine every day   Drug use:  No     Allergies   Patient has no known allergies.   Review of Systems Review of Systems PER HPI  Physical Exam Triage Vital Signs ED Triage Vitals  Encounter Vitals Group     BP 12/14/23 0923 (!) 140/68     Girls Systolic BP Percentile --      Girls Diastolic BP Percentile --      Boys Systolic BP Percentile --      Boys Diastolic BP Percentile --      Pulse Rate 12/14/23 0923 78     Resp 12/14/23 0923 20     Temp 12/14/23 0923 98.3 F (36.8 C)     Temp Source 12/14/23 0923 Oral     SpO2 12/14/23 0923 95 %     Weight --      Height --      Head Circumference --      Peak Flow --      Pain Score 12/14/23 0925 0     Pain Loc --      Pain Education --      Exclude from Growth Chart --    No data found.  Updated Vital Signs BP (!) 140/68 (BP Location: Right Arm)   Pulse 78   Temp 98.3 F (36.8 C) (Oral)   Resp 20   SpO2 95%   Visual Acuity Right Eye Distance:   Left Eye Distance:   Bilateral Distance:    Right Eye Near:   Left Eye Near:    Bilateral Near:     Physical Exam Vitals and nursing note reviewed.  Constitutional:      Appearance: Normal appearance.  HENT:     Head:  Atraumatic.     Right Ear: Tympanic membrane and ear canal normal.     Left Ear: Tympanic membrane and ear canal normal.     Nose: Nose normal.     Mouth/Throat:     Mouth: Mucous membranes are moist.  Eyes:     Extraocular Movements: Extraocular movements intact.     Conjunctiva/sclera: Conjunctivae normal.  Cardiovascular:     Rate and Rhythm: Normal rate.  Pulmonary:     Effort: Pulmonary effort is normal.  Musculoskeletal:        General: Normal range of motion.     Cervical back: Normal range of motion and neck supple.  Skin:    General: Skin is warm and dry.  Neurological:     Mental Status: He is oriented to person, place, and time.  Psychiatric:        Mood and Affect: Mood normal.        Thought Content: Thought content normal.        Judgment: Judgment normal.      UC Treatments / Results  Labs (all labs ordered are listed, but only abnormal results are displayed) Labs Reviewed - No data to display  EKG   Radiology No results found.  Procedures Procedures (including critical care time)  Medications Ordered in UC Medications - No data to display  Initial Impression / Assessment and Plan / UC Course  I have reviewed the triage vital signs and the nursing notes.  Pertinent labs & imaging results that were available during my care of the patient were reviewed by me and considered in my medical decision making (see chart for details).     Vital signs and exam very reassuring today.  Ear canals and TMs both benign with no evidence of wax debris.  Reassurance given, follow-up with hearing aid specialist for further management.  Final Clinical Impressions(s) / UC Diagnoses   Final diagnoses:  Bilateral change in hearing     Discharge Instructions      There was no wax debris and you are ear canals today on exam, everything appears healthy and well    ED Prescriptions   None    PDMP not reviewed this encounter.   Stuart Vernell Norris,  [place] 12/14/23 1006

## 2023-12-14 NOTE — ED Triage Notes (Signed)
 States ears feel stopped up on both sides x 6 months.

## 2023-12-14 NOTE — Discharge Instructions (Signed)
 There was no wax debris and you are ear canals today on exam, everything appears healthy and well

## 2024-02-15 ENCOUNTER — Ambulatory Visit: Admission: EM | Admit: 2024-02-15 | Discharge: 2024-02-15 | Disposition: A | Discharge status: Home / Self Care

## 2024-02-15 ENCOUNTER — Other Ambulatory Visit: Payer: Self-pay

## 2024-02-15 ENCOUNTER — Encounter: Payer: Self-pay | Admitting: Emergency Medicine

## 2024-02-15 DIAGNOSIS — M722 Plantar fascial fibromatosis: Secondary | ICD-10-CM

## 2024-02-15 NOTE — ED Triage Notes (Signed)
 Pt reports left foot pain x3 days. Pt reports history of plantar fascitis and pain feels similar. Denies any known injury.

## 2024-02-15 NOTE — ED Provider Notes (Signed)
 " [facility]    CSN: [id number] Arrival date & time: 02/15/24  0945      History   Chief Complaint Chief Complaint  Patient presents with   Foot Pain    HPI WILHELM GANAWAY is a 89 y.o. male.   The history is provided by the patient.   Patient presents for complaints of pain to the left foot/heel for the past 3 days.  He denies injury or trauma.  States pain worsens with ambulation.He states he has pain with heel walking.  Patient denies injury, trauma, numbness, tingling, swelling, erythema, or the inability to bear weight.  Patient states that he has not taken any medication for his symptoms.  Patient reports history of plantar fasciitis.  Patient states he was told to come here to get a shot in my foot.  note Past Medical History:  Diagnosis Date   Accelerated hypertension 06/29/2014   Arthritis    Back   Chronic kidney disease    Dizziness and giddiness    Enlarged prostate    Esophagitis 05/10/2011   Foley catheter in place    FOOT SPRAIN, RIGHT 07/18/2007   Qualifier: Diagnosis of  By: Margrette MD, Taft     GERD (gastroesophageal reflux disease)    HOH (hard of hearing)    Hx-TIA (transient ischemic attack)    hx of this- wife states 10 years ago-but Dr. Herminio told wife he had had several-has 2 stents in carotid artery-left   Hypercholesteremia    Hypertension    Low back pain    Nocturia    Shingles    Stroke [facility])    had a stroke 05/23/2014   Syncope and collapse     Patient Active Problem List   Diagnosis Date Noted   Slowing, urinary stream 02/14/2021   Syncope 11/02/2017   Syncope and collapse 03/08/2015   Orthostatic hypotension 03/08/2015   Accelerated hypertension 08/28/2014   Benign prostatic hyperplasia with urinary obstruction 07/09/2014   Right leg swelling 07/06/2014   UTI (urinary tract infection) 07/06/2014   ARF (acute renal failure) as a complication of the indwelling Foley 07/05/2014   Cerebral infarction due to  embolism of left middle cerebral artery (HCC) 06/29/2014   Carotid stenosis 06/29/2014   Urinary retention 06/29/2014   Hematuria 06/29/2014   Aphasia S/P CVA 05/28/2014   Dysphagia, pharyngoesophageal phase 05/28/2014   Left middle cerebral artery stroke (HCC) 05/26/2014   Coarctation of aorta, recurrent, post-intervention    Expressive aphasia    Acute right-sided weakness    Stroke (HCC) 05/23/2014   S/P shoulder surgery 12/24/2012   S/P arthroscopy of shoulder 12/16/2012   Shoulder arthritis 12/13/2012   Biceps tendon rupture, proximal 12/13/2012   Partial tear of right subscapularis tendon 12/13/2012   Hypertension 05/10/2011   Hyperlipidemia 05/10/2011   Barrett esophagus 05/10/2011    Past Surgical History:  Procedure Laterality Date   carotid stents  05/2014   2 stents in carotid artery on left side   COLONOSCOPY     COLONOSCOPY  05/17/2011   Procedure: COLONOSCOPY;  Surgeon: Claudis RAYMOND Rivet, MD;  Location: [facility];  Service: Endoscopy;  Laterality: N/A;  830   epidural steroid injection  05/22/2014   had this in lumbar and at 1 am on 05/23/2014 had a stroke   ESOPHAGOGASTRODUODENOSCOPY  10/28/2010   Procedure: ESOPHAGOGASTRODUODENOSCOPY (EGD);  Surgeon: Claudis RAYMOND Rivet, MD;  Location: [facility];  Service: Endoscopy;  Laterality: N/A;  10:30  ESOPHAGOGASTRODUODENOSCOPY N/A 04/24/2014   Procedure: ESOPHAGOGASTRODUODENOSCOPY (EGD);  Surgeon: Claudis RAYMOND Rivet, MD;  Location: [facility];  Service: Endoscopy;  Laterality: N/A;  815   ESOPHAGOGASTRODUODENOSCOPY N/A 06/26/2014   Procedure: ESOPHAGOGASTRODUODENOSCOPY (EGD);  Surgeon: Belvie Just, MD;  Location: [facility];  Service: Endoscopy;  Laterality: N/A;   EUS N/A 06/26/2014   Procedure: FULL UPPER ENDOSCOPIC ULTRASOUND (EUS) RADIAL;  Surgeon: Belvie Just, MD;  Location: [facility];  Service: Endoscopy;  Laterality: N/A;   IR GENERIC HISTORICAL  10/12/2015   IR ANGIO VERTEBRAL SEL VERTEBRAL BILAT MOD SED  10/12/2015 Thyra Nash, MD [facility]   IR GENERIC HISTORICAL  10/12/2015   IR ANGIO INTRA EXTRACRAN SEL COM CAROTID INNOMINATE BILAT MOD SED 10/12/2015 Thyra Nash, MD [facility]   RADIOLOGY WITH ANESTHESIA N/A 05/23/2014   Procedure: RADIOLOGY WITH ANESTHESIA;  Surgeon: Medication Radiologist, MD;  Location: [facility];  Service: Radiology;  Laterality: N/A;   RESECTION DISTAL CLAVICAL Right 12/13/2012   Procedure: RESECTION DISTAL CLAVICAL;  Surgeon: Taft FORBES Minerva, MD;  Location: [facility];  Service: Orthopedics;  Laterality: Right;   SHOULDER ARTHROSCOPY WITH BICEPSTENOTOMY Right 12/13/2012   Procedure: SHOULDER ARTHROSCOPY WITH BICEPS TENOTOMY, limited shoulder debridement;  Surgeon: Taft FORBES Minerva, MD;  Location: [facility];  Service: Orthopedics;  Laterality: Right;   SHOULDER SURGERY Right    torn cartiledge   SHOULDER SURGERY     right shoulder   TRANSURETHRAL RESECTION OF PROSTATE N/A 07/09/2014   Procedure: TRANSURETHRAL RESECTION of prostate WITH BUTTON ;  Surgeon: Norleen Seltzer, MD;  Location: [facility];  Service: Urology;  Laterality: N/A;       Home Medications    Prior to Admission medications  Medication Sig Start Date End Date Taking? Authorizing Provider  ALPRAZolam  (XANAX ) 0.25 MG tablet Take 0.25 mg by mouth at bedtime as needed. 03/18/19   [provider]  aspirin  EC 81 MG tablet Take 81 mg by mouth daily.    [provider]  atorvastatin  (LIPITOR) 40 MG tablet Take 1 tablet (40 mg total) by mouth every evening. 06/05/14   Angiulli, Toribio PARAS, PA-C  Cholecalciferol (VITAMIN D PO) Take 1 capsule by mouth every evening.    [provider]  clopidogrel  (PLAVIX ) 75 MG tablet TAKE 1 TABLET DAILY Patient taking differently: Take 75 mg by mouth every morning. 05/14/15   Jerri Pfeiffer, MD  finasteride  (PROSCAR ) 5 MG tablet TAKE 1 TABLET DAILY 01/26/22   Matilda Senior, MD  olmesartan (BENICAR) 40 MG tablet Take 1 tablet (40 mg total) by mouth  daily. 08/20/23   Tolia, Sunit, DO  pantoprazole  (PROTONIX ) 40 MG tablet Take 40 mg by mouth daily.    [provider]  polyethylene glycol (MIRALAX / GLYCOLAX) packet Take 17 g by mouth every morning.    [provider]  spironolactone (ALDACTONE) 25 MG tablet Take 25 mg by mouth daily. 06/01/19   [provider]    Family History Family History  Problem Relation Age of Onset   Thyroid  disease Mother    Colon cancer Neg Hx     Social History Social History[1]   Allergies   Patient has no known allergies.   Review of Systems Review of Systems Per HPI  Physical Exam Triage Vital Signs ED Triage Vitals  Encounter Vitals Group     BP 02/15/24 1053 133/68     Girls Systolic BP Percentile --      Girls Diastolic BP Percentile --      Boys  Systolic BP Percentile --      Boys Diastolic BP Percentile --      Pulse Rate 02/15/24 1053 70     Resp 02/15/24 1053 20     Temp 02/15/24 1053 98.2 F (36.8 C)     Temp Source 02/15/24 1053 Oral     SpO2 02/15/24 1053 95 %     Weight --      Height --      Head Circumference --      Peak Flow --      Pain Score 02/15/24 1052 9     Pain Loc --      Pain Education --      Exclude from Growth Chart --    No data found.  Updated Vital Signs BP 133/68 (BP Location: Right Arm)   Pulse 70   Temp 98.2 F (36.8 C) (Oral)   Resp 20   SpO2 95%   Visual Acuity Right Eye Distance:   Left Eye Distance:   Bilateral Distance:    Right Eye Near:   Left Eye Near:    Bilateral Near:     Physical Exam Vitals and nursing note reviewed.  Constitutional:      General: He is not in acute distress.    Appearance: Normal appearance.  HENT:     Head: Normocephalic.  Eyes:     Extraocular Movements: Extraocular movements intact.     Pupils: Pupils are equal, round, and reactive to light.  Pulmonary:     Effort: Pulmonary effort is normal.  Musculoskeletal:     Cervical back: Normal range of motion.  Skin:     General: Skin is warm and dry.  Neurological:     General: No focal deficit present.     Mental Status: He is alert and oriented to person, place, and time.  Psychiatric:        Mood and Affect: Mood normal.        Behavior: Behavior normal.      UC Treatments / Results  Labs (all labs ordered are listed, but only abnormal results are displayed) Labs Reviewed - No data to display  EKG   Radiology No results found.  Procedures Procedures (including critical care time)  Medications Ordered in UC Medications - No data to display  Initial Impression / Assessment and Plan / UC Course  I have reviewed the triage vital signs and the nursing notes.  Pertinent labs & imaging results that were available during my care of the patient were reviewed by me and considered in my medical decision making (see chart for details).  Patient presents for complaints of pain in the left heel this been present for the past 3 days.  He has tenderness with palpation of the left heel.  There is no obvious bruising, swelling, or deformity present.  Patient with past history of plantar fasciitis.  Patient was advised that this provider does not perform joint injections, patient was offered corticosteroids for treatment as that is what he received the last time.  Patient states that did not help.  Patient further states that was not me, I was not seen here for that, you have got the wrong chart.  Again patient was offered corticosteroids for his plantar fasciitis symptoms, again he declined.  Patient was advised to follow-up with his primary care physician for further evaluation.  Patient left the clinic, ambulatory at discharge.   Final Clinical Impressions(s) / UC Diagnoses   Final diagnoses:  Plantar fasciitis of left foot   Discharge Instructions   None    ED Prescriptions   None    PDMP not reviewed this encounter.    [1]  Social History Tobacco Use   Smoking status: Former     Current packs/day: 0.00    Average packs/day: 0.5 packs/day for 35.0 years (17.5 ttl pk-yrs)    Types: Cigarettes    Start date: 69    Quit date: 58    Years since quitting: 40.0   Smokeless tobacco: Never  Vaping Use   Vaping status: Never Used  Substance Use Topics   Alcohol  use: Yes    Alcohol /week: 3.0 standard drinks of alcohol     Types: 3 Glasses of wine per week    Comment: 1 glass of wine every day   Drug use: No     Gilmer Etta PARAS, NP 02/15/24 1127  "

## 2024-02-15 NOTE — Discharge Instructions (Signed)
 Patient declined AVS

## 2024-02-20 ENCOUNTER — Ambulatory Visit (INDEPENDENT_AMBULATORY_CARE_PROVIDER_SITE_OTHER): Admitting: Podiatry

## 2024-02-20 ENCOUNTER — Ambulatory Visit

## 2024-02-20 DIAGNOSIS — M722 Plantar fascial fibromatosis: Secondary | ICD-10-CM

## 2024-02-20 MED ORDER — PREDNISONE 5 MG PO TABS: ORAL_TABLET | ORAL | 0 refills | Status: AC

## 2024-02-20 MED ORDER — PREDNISONE 5 MG PO TABS: ORAL_TABLET | ORAL | 0 refills | Status: DC

## 2024-02-20 NOTE — Progress Notes (Signed)
 Patient presents complaining of pain in the heel on the left.  Had an injection in 4 days ago corticosteroid at the heel.  Has not helped a lot so far.  Has not noticed any redness or swelling.  Had this before back in July and was seen by Dr. Tobie   Physical exam:  General appearance: Pleasant, and in no acute distress. AOx3.  Vascular: Pedal pulses: DP 2/4 bilaterally, PT 2/4 bilaterally.  Moderate edema lower legs bilaterally. Capillary fill time immediate bilaterally.  Neurological: Light touch intact feet bilaterally.  Normal Achilles reflex bilaterally.  No clonus or spasticity noted.  Tinel sign tarsal tunnel and porta pedis bilaterally  Dermatologic:   Skin normal temperature bilaterally.  Skin normal color, tone, and texture bilaterally.   Musculoskeletal: Tenderness plantar medial heel at medial plantar calcaneal tubercle left.  No tenderness lateral compression of the calcaneus.  Slight tenderness in the distal plantar fascia.   Diagnosis: 1.  Plantar fasciitis left  Plan: -Established visit for evaluation and management level 3 - Discussed on the plantar fasciitis.  Had an injection 3 days ago so we will give a prescription for prednisone  12-day taper to try to get the inflammation down some more.  Do not usually take several days for the injection to work fully.  Also recommend a night splint. - Rx prednisone  5 mg, 30 mg p.o. daily first day, then decrease by 5 mg every other day for 12 days. -Splints night splint to left  Return 2 weeks follow-up plantar fasciitis left

## 2024-02-20 NOTE — Patient Instructions (Signed)
For instructions on how to put on your Night Splint, please visit [email or web address]  

## 2024-02-27 ENCOUNTER — Ambulatory Visit: Admitting: Podiatry

## 2024-03-05 ENCOUNTER — Ambulatory Visit: Admitting: Podiatry

## 2024-08-06 ENCOUNTER — Encounter: Admitting: Adult Health

## 2024-08-07 ENCOUNTER — Telehealth: Admitting: Adult Health
